# Patient Record
Sex: Female | Born: 1960
Health system: Southern US, Community
[De-identification: ages and names within clinical notes are randomized; demographics above are authoritative.]

## PROBLEM LIST (undated history)

## (undated) ENCOUNTER — Ambulatory Visit: Admission: EM

## (undated) DIAGNOSIS — M1990 Unspecified osteoarthritis, unspecified site: Secondary | ICD-10-CM

## (undated) DIAGNOSIS — E039 Hypothyroidism, unspecified: Secondary | ICD-10-CM

## (undated) DIAGNOSIS — K6289 Other specified diseases of anus and rectum: Secondary | ICD-10-CM

## (undated) DIAGNOSIS — Z9889 Other specified postprocedural states: Secondary | ICD-10-CM

## (undated) DIAGNOSIS — Z803 Family history of malignant neoplasm of breast: Secondary | ICD-10-CM

## (undated) DIAGNOSIS — R7301 Impaired fasting glucose: Secondary | ICD-10-CM

## (undated) DIAGNOSIS — R7303 Prediabetes: Secondary | ICD-10-CM

## (undated) DIAGNOSIS — R112 Nausea with vomiting, unspecified: Secondary | ICD-10-CM

## (undated) DIAGNOSIS — I10 Essential (primary) hypertension: Secondary | ICD-10-CM

## (undated) DIAGNOSIS — E079 Disorder of thyroid, unspecified: Secondary | ICD-10-CM

## (undated) DIAGNOSIS — E785 Hyperlipidemia, unspecified: Secondary | ICD-10-CM

## (undated) DIAGNOSIS — Z8041 Family history of malignant neoplasm of ovary: Secondary | ICD-10-CM

## (undated) DIAGNOSIS — D649 Anemia, unspecified: Secondary | ICD-10-CM

## (undated) DIAGNOSIS — E8881 Metabolic syndrome: Secondary | ICD-10-CM

## (undated) DIAGNOSIS — Z1371 Encounter for nonprocreative screening for genetic disease carrier status: Secondary | ICD-10-CM

## (undated) DIAGNOSIS — R002 Palpitations: Secondary | ICD-10-CM

## (undated) DIAGNOSIS — F419 Anxiety disorder, unspecified: Secondary | ICD-10-CM

## (undated) DIAGNOSIS — K219 Gastro-esophageal reflux disease without esophagitis: Secondary | ICD-10-CM

## (undated) DIAGNOSIS — T8859XA Other complications of anesthesia, initial encounter: Secondary | ICD-10-CM

## (undated) DIAGNOSIS — T4145XA Adverse effect of unspecified anesthetic, initial encounter: Secondary | ICD-10-CM

## (undated) HISTORY — PX: DILATION AND CURETTAGE OF UTERUS: SHX78

## (undated) HISTORY — DX: Hyperlipidemia, unspecified: E78.5

## (undated) HISTORY — DX: Family history of malignant neoplasm of breast: Z80.3

## (undated) HISTORY — DX: Disorder of thyroid, unspecified: E07.9

## (undated) HISTORY — DX: Other specified diseases of anus and rectum: K62.89

## (undated) HISTORY — DX: Impaired fasting glucose: R73.01

## (undated) HISTORY — DX: Family history of malignant neoplasm of ovary: Z80.41

## (undated) HISTORY — DX: Essential (primary) hypertension: I10

## (undated) HISTORY — DX: Palpitations: R00.2

## (undated) HISTORY — PX: OTHER SURGICAL HISTORY: SHX169

## (undated) HISTORY — DX: Metabolic syndrome: E88.81

---
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
## 2004-04-15 ENCOUNTER — Ambulatory Visit: Payer: Self-pay | Admitting: Podiatry

## 2004-12-22 ENCOUNTER — Ambulatory Visit: Payer: Self-pay | Admitting: Otolaryngology

## 2006-05-03 ENCOUNTER — Encounter: Payer: Self-pay | Admitting: Cardiovascular Disease

## 2006-05-03 LAB — CONVERTED CEMR LAB
Lymphocytes, automated: 32 %
Neutrophils Relative %: 57.9 %
RBC: 4.52 M/uL
RDW: 14.9 %
TSH: 0.293 microintl units/mL
WBC: 4.9 10*3/uL

## 2007-01-31 ENCOUNTER — Ambulatory Visit: Payer: Self-pay | Admitting: Internal Medicine

## 2008-01-29 ENCOUNTER — Ambulatory Visit: Payer: Self-pay | Admitting: Family Medicine

## 2008-02-07 ENCOUNTER — Ambulatory Visit: Payer: Self-pay | Admitting: Internal Medicine

## 2008-09-10 ENCOUNTER — Ambulatory Visit: Payer: Self-pay | Admitting: Endocrinology

## 2008-09-13 ENCOUNTER — Ambulatory Visit: Payer: Self-pay | Admitting: Endocrinology

## 2008-11-01 ENCOUNTER — Ambulatory Visit: Payer: Self-pay | Admitting: Endocrinology

## 2008-11-14 ENCOUNTER — Ambulatory Visit: Payer: Self-pay | Admitting: Endocrinology

## 2009-03-01 ENCOUNTER — Emergency Department: Payer: Self-pay | Admitting: Emergency Medicine

## 2009-05-10 ENCOUNTER — Ambulatory Visit: Payer: Self-pay | Admitting: Cardiovascular Disease

## 2009-05-10 DIAGNOSIS — E039 Hypothyroidism, unspecified: Secondary | ICD-10-CM | POA: Insufficient documentation

## 2009-05-10 DIAGNOSIS — E785 Hyperlipidemia, unspecified: Secondary | ICD-10-CM | POA: Insufficient documentation

## 2009-05-10 DIAGNOSIS — I10 Essential (primary) hypertension: Secondary | ICD-10-CM | POA: Insufficient documentation

## 2009-05-29 ENCOUNTER — Encounter: Payer: Self-pay | Admitting: Cardiovascular Disease

## 2009-05-29 HISTORY — PX: ENDOMETRIAL ABLATION W/ NOVASURE: SUR434

## 2009-09-26 ENCOUNTER — Ambulatory Visit: Payer: Self-pay | Admitting: Cardiovascular Disease

## 2009-11-21 ENCOUNTER — Ambulatory Visit: Payer: Self-pay | Admitting: Family Medicine

## 2009-12-18 ENCOUNTER — Ambulatory Visit: Payer: Self-pay | Admitting: Obstetrics & Gynecology

## 2010-02-03 ENCOUNTER — Ambulatory Visit: Payer: Self-pay | Admitting: Internal Medicine

## 2010-03-16 DIAGNOSIS — R002 Palpitations: Secondary | ICD-10-CM

## 2010-03-16 HISTORY — DX: Palpitations: R00.2

## 2010-04-15 NOTE — Progress Notes (Signed)
Summary: PHI  PHI   Imported By: Harlon Flor 05/13/2009 08:24:10  _____________________________________________________________________  External Attachment:    Type:   Image     Comment:   External Document

## 2010-04-15 NOTE — Progress Notes (Signed)
Summary: PHI  PHI   Imported By: Harlon Flor 05/13/2009 10:18:09  _____________________________________________________________________  External Attachment:    Type:   Image     Comment:   External Document

## 2010-04-15 NOTE — Letter (Signed)
Summary: Medical Record Release  Medical Record Release   Imported By: Harlon Flor 05/13/2009 08:23:51  _____________________________________________________________________  External Attachment:    Type:   Image     Comment:   External Document

## 2010-04-15 NOTE — Assessment & Plan Note (Signed)
Summary: NP6   Visit Type:  new patient Referring Provider:  Letta Kocher Primary Provider:  Christell Constant  CC:  HTN.  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  She states that she was initially started on HCTZ for hypertension. She initially started on a half dose and then has since titrated up to 25 mg daily. Her pressures have improved. She is concerned that she might need additional medications in the future. She states that she is trying to work out more and watch her diet. Her weight has been difficult to lose and she is very busy during the daytime and has kids. She has hypothyroidism as well.  She is concerned about her high cholesterol which was measured several months ago though she does note that the numbers. She states any family history of stroke. Otherwise no significant shortness of breath or chest pain she is relatively active though does not participate in an exercise program.  Preventive Screening-Counseling & Management  Alcohol-Tobacco     Smoking Status: never  Caffeine-Diet-Exercise     Does Patient Exercise: no      Drug Use:  no.    Current Medications (verified): 1)  Hydrochlorothiazide 25 Mg Tabs (Hydrochlorothiazide) .... Take One Tablet By Mouth Daily. 2)  Synthroid 137 Mcg Tabs (Levothyroxine Sodium) .... 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  Alcohol  Use - no Regular Exercise - no Drug Use - no Smoking Status:  never Does Patient Exercise:  no Drug Use:  no  Review of Systems  The patient denies anorexia, fever, weight loss, weight gain, vision loss, decreased hearing, hoarseness, chest pain, syncope, dyspnea on exertion, peripheral edema, prolonged cough, headaches, hemoptysis, abdominal pain, melena, hematochezia, severe indigestion/heartburn, hematuria, incontinence, genital sores, muscle weakness, suspicious skin lesions, transient blindness, difficulty walking, depression, unusual weight change, abnormal bleeding, enlarged lymph nodes, and breast masses.    Vital Signs:  Patient profile:   50 year old female Height:      67 inches Weight:      212 pounds BMI:     33.32 Pulse rate:   84 / minute BP sitting:   136 / 74  (right arm) Cuff size:   large  Vitals Entered By: Hardin Negus, RMA (May 10, 2009 11:17 AM) CC: HTN Comments had a depo shot in Jan   Physical Exam  General:  Middle aged woman in no apparent distress, alert and oriented x3, HEENT exam is benign, neck is supple with no JVP or carotid bruits, heart sounds are regular with normal S1 and S2 and no murmurs appreciated, lungs are clear, abdominal exam notable for mild obesity though otherwise benign, no significant lower extremity edema, neurologic exam is nonfocal,  skin is warm and dry.   Problems:  Medical Problems Added: 1)  Dx of Unspecified Hypothyroidism  (ICD-244.9) 2)  Dx of Hyperlipidemia-mixed  (ICD-272.4) 3)  Dx of Hypertension, Unspecified  (ICD-401.9)  EKG  Procedure date:  05/10/2009  Findings:      normal sinus rhythm with rate of 84 beats per minute, no significant ST or T wave changes.  Impression & Recommendations:  Problem # 1:  HYPERTENSION, UNSPECIFIED (ICD-401.9) blood pressure is well controlled on today's visit we have suggested that she continue on her current medication dose. We have encouraged increased exercise,  weight loss and watching her diet. Her updated medication list for this problem includes:    Hydrochlorothiazide 25 Mg Tabs (Hydrochlorothiazide) .Marland Kitchen... Take one tablet by mouth daily.  Problem # 2:  HYPERLIPIDEMIA-MIXED (ICD-272.4) per the patient, she has a history of hyperlipidemia. We have suggested that she worked hard for the next 6 months on her diet, weight loss and exercise and recheck it in 6 months time. she does have a family history of peripheral vascular disease in her mother and father. I have asked her to try to obtain her most recent lipid panel for our review as well.  Problem # 3:  UNSPECIFIED HYPOTHYROIDISM (ICD-244.9) history of hypothyroidism for many years. This has been managed by her primary care physician. Her updated medication list for this problem includes:    Synthroid 137 Mcg Tabs (Levothyroxine sodium) ..... Once daily

## 2010-04-15 NOTE — Assessment & Plan Note (Signed)
Summary: ROV   Visit Type:  rov Referring Provider:  Letta Kocher Primary Provider:  Christell Constant  CC:  palpitations...sob...pt thinks she may be having some anxiety attacks....edema/ankles....denies any cp.  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  she reports that she has not been taking her HCTZ. She has been having periods of anxiety, several anxiety attacks. She reports several episodes when she felt claustrophobic and felt very anxious and had to go to the emergency room. One was when she was climbing a Environmental manager. She also reports another episode when she was in a crowd at a graduation. Her dates have been stressful, busy at work. She reports a significant amount of palpitations recently and has been relatively symptomatic. These do not come and long stretches though intermittently.  normal sinus rhythm with rate of 92 beats per minute, no significant ST or T wave changes.   Current Medications (verified): 1)  Hydrochlorothiazide 25 Mg Tabs (Hydrochlorothiazide) .Marland Kitchen.. 1 Tab Twice Weekly 2)  Synthroid 137 Mcg Tabs (Levothyroxine Sodium) .... Once Daily  Allergies (verified): No Known Drug Allergies  Past History:  Past Surgical History: Last updated: 05/10/2009 D&C  C-section  Review of Systems  The patient denies fever, weight loss, weight gain, vision loss, decreased hearing, hoarseness, chest pain, syncope, dyspnea on exertion, peripheral edema, prolonged cough, abdominal pain, incontinence, muscle weakness, depression, and enlarged lymph nodes.         palpitations  Vital Signs:  Patient profile:   49 year old female Height:      67 inches Weight:      214 pounds BMI:     33.64 Pulse rate:   92 / minute Pulse rhythm:   irregular BP sitting:   139 / 91  (left arm) Cuff size:   large  Vitals Entered By: Danielle Rankin, CMA  (September 26, 2009 3:28 PM)  Physical Exam  General:  Well developed, well nourished, in no acute distress. Head:  normocephalic and atraumatic Neck:  Neck supple, no JVD. No masses, thyromegaly or abnormal cervical nodes. Lungs:  Clear bilaterally to auscultation and percussion. Heart:  Non-displaced PMI, chest non-tender; regular rate and rhythm, S1, S2 without murmurs, rubs or gallops. Carotid upstroke normal, no bruit.  Pedals normal pulses. No edema, no varicosities. Abdomen:  Bowel sounds positive; abdomen soft and non-tender without masses Msk:  Back normal, normal gait. Muscle strength and tone normal. Pulses:  pulses normal in all 4 extremities Extremities:  No clubbing or cyanosis. Neurologic:  Alert and oriented x 3. Skin:  Intact without lesions or rashes. Psych:  Slightly anxious   Impression & Recommendations:  Problem # 1:  HYPERTENSION, UNSPECIFIED (ICD-401.9) her blood pressure is mildly elevated today. She has not been taking her HCTZ for uncertain reasons Her heart rate is mildly elevated today and she reports numerous episodes of significant anxiety. She has had recent episodes of palpitations. Her heart rate is mildly elevated in the office today and she appears slightly anxious.  We have suggested that she try a low-dose beta blocker, bystolic 5 mg daily and we have given her some samples. If this works, we could change it to a generic equivalent. This would help with palpitations that she is having, elevated heart rates and with her blood pressure.  If she continues to have anxiety, I've asked her to talk with her primary care physician  to see if she may benefit from p.r.n. benzodiazepines or other medications.  The following medications were removed from the medication list:    Hydrochlorothiazide 25 Mg Tabs (Hydrochlorothiazide) .Marland Kitchen... 1 tab twice weekly Her updated medication list for this problem includes:    Bystolic 5 Mg Tabs (Nebivolol hcl) ..... Once  daily  Problem # 2:  HYPERLIPIDEMIA-MIXED (ICD-272.4) She reports having an elevated cholesterol when it was checked in the hospital.  We have asked her to have this checked either through work we can do it in  our office.  Patient Instructions: 1)  Your physician recommends that you schedule a follow-up appointment in:  6 mths 2)  Your physician has recommended you make the following change in your medication: start Bystolic 5 mg once daily

## 2010-08-26 ENCOUNTER — Telehealth: Payer: Self-pay | Admitting: Cardiovascular Disease

## 2010-08-26 NOTE — Telephone Encounter (Signed)
Pt called in stating that she had discussed Bystolic with you at her last appt which was 09/2009. She was given samples at that time but did not take them. She would like to try medication because her BP has been increasing. She states that she has lost some weight but her BP has been increasing. She is having oral surgery on the 21st and states she needs something to bring down BP. She didn't have any BP readings but she is due for a f/u you. Notified patient I didn't feel comfortable calling in a medication that she hasn't tried and I tried bring her in on June 14th. But pt has to work, she can not make it in at 1:45. (which was our only opening) Please advise. Thanks, Huntley Dec

## 2010-08-26 NOTE — Telephone Encounter (Signed)
Pt discussed Bystollic with Gollan.  Would like to try.  Please call the pt to advise.  Pt states that she has not been able to control BP with diet.

## 2010-08-27 NOTE — Telephone Encounter (Signed)
She could try bystolic (we have samples of 5 mg daily that she could pick up She could also try lisinopril 10 mg daily. We could call the lisinopril to [facility] pharmacy

## 2010-08-28 NOTE — Telephone Encounter (Signed)
Notified patient Dr. Mariah Milling suggested Bystolic 5 mg or lisinopril 10 mg daily.  She would like to try the samples of Bystolic 5 mg first. Gave samples for two week supply.

## 2010-09-11 ENCOUNTER — Encounter: Payer: Self-pay | Admitting: Cardiovascular Disease

## 2010-09-11 ENCOUNTER — Ambulatory Visit (INDEPENDENT_AMBULATORY_CARE_PROVIDER_SITE_OTHER): Payer: Private Health Insurance - Indemnity | Admitting: Cardiovascular Disease

## 2010-09-11 DIAGNOSIS — E785 Hyperlipidemia, unspecified: Secondary | ICD-10-CM

## 2010-09-11 DIAGNOSIS — I10 Essential (primary) hypertension: Secondary | ICD-10-CM

## 2010-09-11 DIAGNOSIS — R002 Palpitations: Secondary | ICD-10-CM | POA: Insufficient documentation

## 2010-09-11 NOTE — Assessment & Plan Note (Signed)
Blood pressure is well controlled on today's visit. She is not taking bystolic on a regular basis, only for palpitations.

## 2010-09-11 NOTE — Patient Instructions (Signed)
You are doing well. No medication changes were made. Please call us if you have new issues that need to be addressed before your next appt.  We will call you for a follow up Appt. In 12 months  

## 2010-09-11 NOTE — Assessment & Plan Note (Signed)
Palpitations have been well controlled on no significant beta blocker. I suspect are secondary to stress. She can take low dose beta blocker as needed

## 2010-09-11 NOTE — Assessment & Plan Note (Signed)
We talked at length with her about her lipids. We have recommended weight loss, she does not want a prescription medicine. She reports that her cholesterol has been elevated in the past. We will check it when she has her numbers done through a [facility] in October.

## 2010-09-11 NOTE — Progress Notes (Signed)
   Patient ID: Desiree Walker, female    DOB: 09/12/1960, 50 y.o.   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 She has a history of anxiety, several anxiety attacks. H/o palpitations requiring low dose b-blockers.   She reports that she has been gaining weight. Her weight was 190 on weight watchers, now 202. She is very stressed, working 2 jobs and doing school at night time Monday through Thursday. She denies significant palpitations and has not been taking bystolic.    normal sinus rhythm with rate of 81 beats per minute, nonspecific ST changes in the anterolateral leads and inferior leads,  Likely early repol. Abn.        Review of Systems  Constitutional: Positive for unexpected weight change.  HENT: Negative.   Eyes: Negative.   Respiratory: Negative.   Cardiovascular: Negative.   Gastrointestinal: Negative.   Musculoskeletal: Negative.   Skin: Negative.   Neurological: Negative.   Hematological: Negative.   Psychiatric/Behavioral: The patient is nervous/anxious.   All other systems reviewed and are negative.    BP 121/79  Pulse 81  Ht 5\' 7"  (1.702 m)  Wt 202 lb (91.627 kg)  BMI 31.64 kg/m2  Physical Exam  Nursing note and vitals reviewed. Constitutional: She is oriented to person, place, and time. She appears well-developed and well-nourished.  HENT:  Head: Normocephalic.  Nose: Nose normal.  Mouth/Throat: Oropharynx is clear and moist.  Eyes: Conjunctivae are normal. Pupils are equal, round, and reactive to light.  Neck: Normal range of motion. Neck supple. No JVD present.  Cardiovascular: Normal rate, regular rhythm, S1 normal, S2 normal, normal heart sounds and intact distal pulses.  Exam reveals no gallop and no friction rub.   No murmur heard. Pulmonary/Chest: Effort normal and breath sounds normal. No respiratory distress. She has no  wheezes. She has no rales. She exhibits no tenderness.  Abdominal: Soft. Bowel sounds are normal. She exhibits no distension. There is no tenderness.  Musculoskeletal: Normal range of motion. She exhibits no edema and no tenderness.  Lymphadenopathy:    She has no cervical adenopathy.  Neurological: She is alert and oriented to person, place, and time. Coordination normal.  Skin: Skin is warm and dry. No rash noted. No erythema.  Psychiatric: She has a normal mood and affect. Her behavior is normal. Judgment and thought content normal.         Assessment and Plan

## 2010-11-07 ENCOUNTER — Ambulatory Visit: Payer: Self-pay | Admitting: Podiatry

## 2010-11-07 ENCOUNTER — Other Ambulatory Visit: Payer: Self-pay | Admitting: Physician Assistant

## 2011-01-09 ENCOUNTER — Ambulatory Visit: Payer: Self-pay | Admitting: General Practice

## 2011-01-20 ENCOUNTER — Emergency Department: Payer: Self-pay | Admitting: Unknown Physician Specialty

## 2011-04-20 ENCOUNTER — Ambulatory Visit: Payer: Self-pay | Admitting: Gastroenterology

## 2011-08-17 ENCOUNTER — Ambulatory Visit: Payer: Self-pay

## 2011-08-17 ENCOUNTER — Ambulatory Visit: Payer: Self-pay | Admitting: Emergency Medicine

## 2011-08-31 ENCOUNTER — Ambulatory Visit: Payer: Self-pay | Admitting: Obstetrics & Gynecology

## 2011-11-03 ENCOUNTER — Other Ambulatory Visit: Payer: Self-pay

## 2011-11-03 LAB — TSH: Thyroid Stimulating Horm: 0.385 u[IU]/mL — ABNORMAL LOW

## 2011-11-28 ENCOUNTER — Ambulatory Visit: Payer: Self-pay | Admitting: Medical

## 2011-12-02 ENCOUNTER — Ambulatory Visit: Payer: Self-pay | Admitting: Family Medicine

## 2011-12-02 LAB — CBC WITH DIFFERENTIAL/PLATELET
Basophil #: 0.1 10*3/uL (ref 0.0–0.1)
Eosinophil %: 2.8 %
HCT: 42 % (ref 35.0–47.0)
Lymphocyte #: 1.5 10*3/uL (ref 1.0–3.6)
Lymphocyte %: 32.1 %
Monocyte %: 9.6 %
Platelet: 210 10*3/uL (ref 150–440)
RDW: 13.4 % (ref 11.5–14.5)
WBC: 4.7 10*3/uL (ref 3.6–11.0)

## 2012-01-01 ENCOUNTER — Other Ambulatory Visit: Payer: Self-pay | Admitting: General Practice

## 2012-01-01 LAB — LIPID PANEL
Cholesterol: 220 mg/dL — ABNORMAL HIGH (ref 0–200)
Ldl Cholesterol, Calc: 121 mg/dL — ABNORMAL HIGH (ref 0–100)
VLDL Cholesterol, Calc: 46 mg/dL — ABNORMAL HIGH (ref 5–40)

## 2012-01-01 LAB — COMPREHENSIVE METABOLIC PANEL
Albumin: 3.8 g/dL (ref 3.4–5.0)
Anion Gap: 10 (ref 7–16)
Bilirubin,Total: 0.3 mg/dL (ref 0.2–1.0)
Calcium, Total: 8.7 mg/dL (ref 8.5–10.1)
Co2: 26 mmol/L (ref 21–32)
Osmolality: 282 (ref 275–301)
Potassium: 3.7 mmol/L (ref 3.5–5.1)
Sodium: 141 mmol/L (ref 136–145)
Total Protein: 7.3 g/dL (ref 6.4–8.2)

## 2012-01-01 LAB — CBC WITH DIFFERENTIAL/PLATELET
Basophil %: 0.8 %
Eosinophil %: 2.2 %
HGB: 12.7 g/dL (ref 12.0–16.0)
MCH: 26.6 pg (ref 26.0–34.0)
Monocyte #: 0.4 x10 3/mm (ref 0.2–0.9)
Monocyte %: 6.3 %
Neutrophil %: 57.1 %
Platelet: 192 10*3/uL (ref 150–440)

## 2012-01-01 LAB — TSH: Thyroid Stimulating Horm: 1.78 u[IU]/mL

## 2012-01-25 ENCOUNTER — Ambulatory Visit: Payer: Self-pay

## 2012-01-26 ENCOUNTER — Other Ambulatory Visit: Payer: Self-pay

## 2012-01-26 LAB — TSH: Thyroid Stimulating Horm: 1.39 u[IU]/mL

## 2012-02-14 ENCOUNTER — Ambulatory Visit: Payer: Self-pay

## 2012-03-16 ENCOUNTER — Ambulatory Visit: Payer: Self-pay

## 2012-03-16 HISTORY — PX: COLONOSCOPY: SHX174

## 2012-04-08 ENCOUNTER — Ambulatory Visit: Payer: Self-pay

## 2012-05-31 ENCOUNTER — Other Ambulatory Visit: Payer: Self-pay

## 2012-05-31 LAB — TSH: Thyroid Stimulating Horm: 2.46 u[IU]/mL

## 2012-06-01 LAB — CBC WITH DIFFERENTIAL/PLATELET
Basophil #: 0 10*3/uL (ref 0.0–0.1)
Eosinophil %: 1.6 %
HGB: 12.6 g/dL (ref 12.0–16.0)
Lymphocyte #: 2 10*3/uL (ref 1.0–3.6)
MCHC: 32.8 g/dL (ref 32.0–36.0)
MCV: 81 fL (ref 80–100)
Monocyte #: 0.3 x10 3/mm (ref 0.2–0.9)
Monocyte %: 6.1 %
Neutrophil %: 53.7 %
Platelet: 212 10*3/uL (ref 150–440)
RDW: 13.9 % (ref 11.5–14.5)

## 2012-06-01 LAB — COMPREHENSIVE METABOLIC PANEL
Albumin: 3.8 g/dL (ref 3.4–5.0)
BUN: 18 mg/dL (ref 7–18)
Co2: 26 mmol/L (ref 21–32)
EGFR (African American): >60
EGFR (Non-African Amer.): >60
Glucose: 78 mg/dL (ref 65–99)
Osmolality: 278 (ref 275–301)
Potassium: 4.2 mmol/L (ref 3.5–5.1)
SGOT(AST): 23 U/L (ref 15–37)

## 2012-07-27 DIAGNOSIS — G8929 Other chronic pain: Secondary | ICD-10-CM | POA: Insufficient documentation

## 2012-07-27 DIAGNOSIS — R1031 Right lower quadrant pain: Secondary | ICD-10-CM | POA: Insufficient documentation

## 2012-08-01 ENCOUNTER — Other Ambulatory Visit: Payer: Self-pay

## 2012-08-01 LAB — BASIC METABOLIC PANEL
Calcium, Total: 9.3 mg/dL (ref 8.5–10.1)
Chloride: 104 mmol/L (ref 98–107)
Co2: 29 mmol/L (ref 21–32)
EGFR (African American): >60
EGFR (Non-African Amer.): >60
Glucose: 94 mg/dL (ref 65–99)
Osmolality: 276 (ref 275–301)

## 2012-08-01 LAB — HEMOGLOBIN A1C: Hemoglobin A1C: 6 % (ref 4.2–6.3)

## 2012-08-23 ENCOUNTER — Ambulatory Visit: Payer: Self-pay | Admitting: General Surgery

## 2012-09-19 ENCOUNTER — Encounter: Payer: Self-pay | Admitting: *Deleted

## 2012-09-26 ENCOUNTER — Ambulatory Visit (INDEPENDENT_AMBULATORY_CARE_PROVIDER_SITE_OTHER): Payer: Managed Care, Other (non HMO) | Admitting: General Surgery

## 2012-09-26 ENCOUNTER — Encounter: Payer: Self-pay | Admitting: General Surgery

## 2012-09-26 VITALS — BP 150/88 | HR 68 | Resp 14 | Ht 67.0 in | Wt 221.0 lb

## 2012-09-26 DIAGNOSIS — G8929 Other chronic pain: Secondary | ICD-10-CM

## 2012-09-26 DIAGNOSIS — R1031 Right lower quadrant pain: Secondary | ICD-10-CM

## 2012-09-26 NOTE — Progress Notes (Signed)
Patient ID: Desiree Walker, female   DOB: 07/20/1960, 52 y.o.   MRN: [id number]  Chief Complaint  Patient presents with  . Abdominal Pain    HPI Desiree Walker is a 52 y.o. female who presents for chronic pelvic and right lower quadrant discomfort. She states the discomfort started approximately 2 months ago. She states she has had some weight gain in this time frame. She has noticed a difference in appetite. Her appetite has decreased and she has had some nausea associated with eating. Patient reports last menstrual period was two years ago.  The patient reports early satiety, in spite of this reports significant weight gain (10-15 pounds) the last 6 months.  She has not appreciated any diarrhea or change in bowel habits.No vomiting.  Abdominal Pain Associated symptoms include nausea.    Past Medical History  Diagnosis Date  . Hyperlipidemia   . Hypertension   . Palpitations   . Thyroid disease     unspecified hypothyroidism    Past Surgical History  Procedure Laterality Date  . Dilation and curettage of uterus    . Cesarean section      Family History  Problem Relation Age of Onset  . Hypertension Mother   . Heart disease Mother   . Cancer Sister 70    uterine  . Cancer Maternal Aunt 45    breast    Social History History  Substance Use Topics  . Smoking status: Never Smoker   . Smokeless tobacco: Not on file  . Alcohol Use: No    No Known Allergies  Current Outpatient Prescriptions  Medication Sig Dispense Refill  . levothyroxine (SYNTHROID, LEVOTHROID) 112 MCG tablet Take 112 mcg by mouth daily before breakfast.       No current facility-administered medications for this visit.    Review of Systems Review of Systems  Constitutional: Positive for appetite change.  Respiratory: Negative.   Cardiovascular: Negative.   Gastrointestinal: Positive for nausea and abdominal pain.    Blood pressure 150/88, pulse 68, resp. rate 14, height 5\' 7"   (1.702 m), weight 221 lb (100.245 kg).  Physical Exam Physical Exam  Constitutional: She is oriented to person, place, and time. She appears well-developed and well-nourished.  Neck: No thyromegaly present.  Cardiovascular: Normal rate, regular rhythm and normal heart sounds.   No murmur heard. Pulmonary/Chest: Effort normal and breath sounds normal.  Abdominal: Soft. Normal appearance and bowel sounds are normal.  Lymphadenopathy:    She has no cervical adenopathy.  Neurological: She is alert and oriented to person, place, and time.  Skin: Skin is warm and dry.    Data Reviewed Radiologic data base was reviewed for the last 2 years. CT of the neck in January 2014 and chest September 2013 reviewed.  Assessment    Chronic lower abdominal pain, right lower quadrant predominant.     Plan    The patient has no significant GI symptoms to suggest a colonic source, and she has had a colonoscopy with the last few years. She does show some mild tenderness with palpation right lower quadrant. Her GYN exam and transvaginal ultrasound were unremarkable. I think a CT scan of the abdomen and pelvis and pleura 5 any occult pathology was present.     Patient has been scheduled for a CT abdomen/pelvis with contrast at [facility] for 09-30-12 at 9 am (arrive 8:45 am). Prep: no solids 4 hours prior but patient may have clear liquids up until exam time, pick up prep kit,  and take medication list. Patient verbalizes understanding.    Earline Mayotte 09/26/2012, 9:09 PM

## 2012-09-26 NOTE — Patient Instructions (Signed)
Patient has been scheduled for a CT abdomen/pelvis with contrast at [facility] for 09-30-12 at 9 am (arrive 8:45 am). Prep: no solids 4 hours prior but patient may have clear liquids up until exam time, pick up prep kit, and take medication list. Patient verbalizes understanding.

## 2012-10-03 ENCOUNTER — Ambulatory Visit: Payer: Self-pay | Admitting: General Surgery

## 2012-10-06 ENCOUNTER — Telehealth: Payer: Self-pay

## 2012-10-06 NOTE — Telephone Encounter (Signed)
Patient called and would like the results of her CT scan. Patient will be at this number today and tomorrow between 7:30 am and 4:30 pm. [phone].

## 2012-10-07 ENCOUNTER — Encounter: Payer: Self-pay | Admitting: *Deleted

## 2013-02-02 ENCOUNTER — Other Ambulatory Visit: Payer: Self-pay

## 2013-02-11 ENCOUNTER — Ambulatory Visit: Payer: Self-pay | Admitting: Orthopedic Surgery

## 2013-03-01 ENCOUNTER — Encounter: Payer: Self-pay | Admitting: Orthopedic Surgery

## 2013-03-16 ENCOUNTER — Encounter: Payer: Self-pay | Admitting: Orthopedic Surgery

## 2013-03-24 ENCOUNTER — Other Ambulatory Visit: Payer: Self-pay

## 2013-03-24 LAB — TSH: Thyroid Stimulating Horm: 2.71 u[IU]/mL

## 2013-04-10 ENCOUNTER — Emergency Department: Payer: Self-pay | Admitting: Emergency Medicine

## 2013-04-10 LAB — BASIC METABOLIC PANEL
Anion Gap: 4 — ABNORMAL LOW (ref 7–16)
BUN: 14 mg/dL (ref 7–18)
CHLORIDE: 107 mmol/L (ref 98–107)
CREATININE: 0.86 mg/dL (ref 0.60–1.30)
Calcium, Total: 9.4 mg/dL (ref 8.5–10.1)
Co2: 26 mmol/L (ref 21–32)
EGFR (African American): >60
EGFR (Non-African Amer.): >60
Glucose: 110 mg/dL — ABNORMAL HIGH (ref 65–99)
Osmolality: 275 (ref 275–301)
Potassium: 3.9 mmol/L (ref 3.5–5.1)
Sodium: 137 mmol/L (ref 136–145)

## 2013-04-10 LAB — CBC WITH DIFFERENTIAL/PLATELET
BASOS ABS: 0.1 10*3/uL (ref 0.0–0.1)
BASOS PCT: 0.8 %
EOS PCT: 1.1 %
Eosinophil #: 0.1 10*3/uL (ref 0.0–0.7)
HCT: 41.8 % (ref 35.0–47.0)
HGB: 13.6 g/dL (ref 12.0–16.0)
Lymphocyte #: 1.3 10*3/uL (ref 1.0–3.6)
Lymphocyte %: 18.4 %
MCH: 26.5 pg (ref 26.0–34.0)
MCHC: 32.5 g/dL (ref 32.0–36.0)
MCV: 82 fL (ref 80–100)
MONOS PCT: 5.1 %
Monocyte #: 0.4 x10 3/mm (ref 0.2–0.9)
NEUTROS ABS: 5.3 10*3/uL (ref 1.4–6.5)
Neutrophil %: 74.6 %
Platelet: 201 10*3/uL (ref 150–440)
RBC: 5.13 10*6/uL (ref 3.80–5.20)
RDW: 13.5 % (ref 11.5–14.5)
WBC: 7 10*3/uL (ref 3.6–11.0)

## 2013-04-10 LAB — TROPONIN I: Troponin-I: <0.02 ng/mL

## 2013-04-10 LAB — RAPID INFLUENZA A&B ANTIGENS

## 2013-04-11 LAB — URINALYSIS, COMPLETE
Bilirubin,UR: NEGATIVE
Blood: NEGATIVE
Glucose,UR: NEGATIVE mg/dL (ref 0–75)
KETONE: NEGATIVE
Nitrite: NEGATIVE
Ph: 7 (ref 4.5–8.0)
Protein: NEGATIVE
Specific Gravity: 1.01 (ref 1.003–1.030)
Squamous Epithelial: 5

## 2013-04-12 LAB — URINE CULTURE

## 2013-05-03 ENCOUNTER — Other Ambulatory Visit: Payer: Self-pay | Admitting: Physician Assistant

## 2013-05-03 LAB — URINALYSIS, COMPLETE
BLOOD: NEGATIVE
Bilirubin,UR: NEGATIVE
GLUCOSE, UR: NEGATIVE mg/dL (ref 0–75)
Ketone: NEGATIVE
Nitrite: NEGATIVE
Ph: 6 (ref 4.5–8.0)
Protein: NEGATIVE
RBC,UR: 1 /HPF (ref 0–5)
Specific Gravity: 1.013 (ref 1.003–1.030)
Squamous Epithelial: 5
WBC UR: 11 /HPF (ref 0–5)

## 2013-05-05 LAB — URINE CULTURE

## 2013-06-05 DIAGNOSIS — N393 Stress incontinence (female) (male): Secondary | ICD-10-CM | POA: Insufficient documentation

## 2013-06-05 DIAGNOSIS — R339 Retention of urine, unspecified: Secondary | ICD-10-CM | POA: Insufficient documentation

## 2013-06-05 DIAGNOSIS — N3020 Other chronic cystitis without hematuria: Secondary | ICD-10-CM | POA: Insufficient documentation

## 2013-06-05 DIAGNOSIS — N3941 Urge incontinence: Secondary | ICD-10-CM | POA: Insufficient documentation

## 2013-06-05 DIAGNOSIS — R300 Dysuria: Secondary | ICD-10-CM | POA: Insufficient documentation

## 2013-06-09 DIAGNOSIS — D414 Neoplasm of uncertain behavior of bladder: Secondary | ICD-10-CM | POA: Insufficient documentation

## 2013-07-19 ENCOUNTER — Ambulatory Visit: Payer: Self-pay

## 2013-07-19 LAB — TSH: Thyroid Stimulating Horm: 3.17 u[IU]/mL

## 2013-07-20 DIAGNOSIS — E063 Autoimmune thyroiditis: Secondary | ICD-10-CM | POA: Insufficient documentation

## 2013-09-05 ENCOUNTER — Other Ambulatory Visit: Payer: Self-pay

## 2013-09-05 LAB — TSH: Thyroid Stimulating Horm: 1.47 u[IU]/mL

## 2013-09-14 ENCOUNTER — Other Ambulatory Visit: Payer: Self-pay | Admitting: Family Medicine

## 2013-09-14 LAB — COMPREHENSIVE METABOLIC PANEL
ALT: 20 U/L (ref 12–78)
Albumin: 3.6 g/dL (ref 3.4–5.0)
Alkaline Phosphatase: 90 U/L
Anion Gap: 5 — ABNORMAL LOW (ref 7–16)
BILIRUBIN TOTAL: 0.3 mg/dL (ref 0.2–1.0)
BUN: 19 mg/dL — ABNORMAL HIGH (ref 7–18)
CHLORIDE: 106 mmol/L (ref 98–107)
CO2: 28 mmol/L (ref 21–32)
Calcium, Total: 9.2 mg/dL (ref 8.5–10.1)
Creatinine: 0.89 mg/dL (ref 0.60–1.30)
EGFR (African American): >60
EGFR (Non-African Amer.): >60
Glucose: 111 mg/dL — ABNORMAL HIGH (ref 65–99)
OSMOLALITY: 280 (ref 275–301)
Potassium: 4 mmol/L (ref 3.5–5.1)
SGOT(AST): 15 U/L (ref 15–37)
Sodium: 139 mmol/L (ref 136–145)
TOTAL PROTEIN: 7.2 g/dL (ref 6.4–8.2)

## 2013-09-14 LAB — LIPID PANEL
Cholesterol: 234 mg/dL — ABNORMAL HIGH (ref 0–200)
HDL: 51 mg/dL (ref 40–60)
LDL CHOLESTEROL, CALC: 153 mg/dL — AB (ref 0–100)
TRIGLYCERIDES: 149 mg/dL (ref 0–200)
VLDL Cholesterol, Calc: 30 mg/dL (ref 5–40)

## 2013-09-14 LAB — CBC WITH DIFFERENTIAL/PLATELET
Basophil #: 0 10*3/uL (ref 0.0–0.1)
Basophil %: 1.2 %
Eosinophil #: 0.1 10*3/uL (ref 0.0–0.7)
Eosinophil %: 3 %
HCT: 39.7 % (ref 35.0–47.0)
HGB: 13.3 g/dL (ref 12.0–16.0)
LYMPHS ABS: 1.5 10*3/uL (ref 1.0–3.6)
Lymphocyte %: 38.4 %
MCH: 26.9 pg (ref 26.0–34.0)
MCHC: 33.4 g/dL (ref 32.0–36.0)
MCV: 81 fL (ref 80–100)
Monocyte #: 0.3 x10 3/mm (ref 0.2–0.9)
Monocyte %: 6.9 %
NEUTROS ABS: 2 10*3/uL (ref 1.4–6.5)
Neutrophil %: 50.5 %
Platelet: 207 10*3/uL (ref 150–440)
RBC: 4.93 10*6/uL (ref 3.80–5.20)
RDW: 13.8 % (ref 11.5–14.5)
WBC: 4 10*3/uL (ref 3.6–11.0)

## 2013-09-14 LAB — HEMOGLOBIN A1C: Hemoglobin A1C: 5.9 % (ref 4.2–6.3)

## 2013-09-25 ENCOUNTER — Ambulatory Visit: Payer: Private Health Insurance - Indemnity | Admitting: Cardiovascular Disease

## 2013-09-29 ENCOUNTER — Ambulatory Visit: Payer: Self-pay | Admitting: Family Medicine

## 2013-10-10 ENCOUNTER — Ambulatory Visit: Payer: Self-pay | Admitting: Podiatry

## 2013-10-10 LAB — BASIC METABOLIC PANEL
Anion Gap: 6 — ABNORMAL LOW (ref 7–16)
BUN: 12 mg/dL (ref 7–18)
CREATININE: 0.82 mg/dL (ref 0.60–1.30)
Calcium, Total: 9.2 mg/dL (ref 8.5–10.1)
Chloride: 104 mmol/L (ref 98–107)
Co2: 29 mmol/L (ref 21–32)
EGFR (African American): >60
EGFR (Non-African Amer.): >60
GLUCOSE: 81 mg/dL (ref 65–99)
Osmolality: 276 (ref 275–301)
Potassium: 4.1 mmol/L (ref 3.5–5.1)
Sodium: 139 mmol/L (ref 136–145)

## 2013-10-17 ENCOUNTER — Ambulatory Visit: Payer: Self-pay | Admitting: Family Medicine

## 2013-10-20 ENCOUNTER — Ambulatory Visit: Payer: Self-pay | Admitting: Podiatry

## 2013-11-06 ENCOUNTER — Ambulatory Visit: Payer: Self-pay | Admitting: Urgent Care

## 2013-12-25 ENCOUNTER — Ambulatory Visit: Payer: Self-pay | Admitting: Physician Assistant

## 2014-01-02 ENCOUNTER — Encounter: Payer: Self-pay | Admitting: Podiatry

## 2014-01-14 ENCOUNTER — Encounter: Payer: Self-pay | Admitting: Podiatry

## 2014-01-15 ENCOUNTER — Encounter: Payer: Self-pay | Admitting: General Surgery

## 2014-02-13 ENCOUNTER — Encounter: Payer: Self-pay | Admitting: Podiatry

## 2014-03-16 ENCOUNTER — Encounter: Payer: Self-pay | Admitting: Podiatry

## 2014-04-02 ENCOUNTER — Ambulatory Visit (INDEPENDENT_AMBULATORY_CARE_PROVIDER_SITE_OTHER): Payer: Managed Care, Other (non HMO)

## 2014-04-02 ENCOUNTER — Telehealth: Payer: Self-pay | Admitting: Podiatry

## 2014-04-02 ENCOUNTER — Ambulatory Visit (INDEPENDENT_AMBULATORY_CARE_PROVIDER_SITE_OTHER): Payer: Managed Care, Other (non HMO) | Admitting: Podiatry

## 2014-04-02 ENCOUNTER — Encounter: Payer: Self-pay | Admitting: Podiatry

## 2014-04-02 VITALS — BP 149/102 | HR 90 | Resp 16 | Ht 67.0 in | Wt 224.0 lb

## 2014-04-02 DIAGNOSIS — Q6652 Congenital pes planus, left foot: Secondary | ICD-10-CM

## 2014-04-02 NOTE — Telephone Encounter (Signed)
Patient called stating she spoke to you earlier in regards to getting medical records sent over for her 4pm appointment today. Please call her back on her work number.

## 2014-04-02 NOTE — Progress Notes (Signed)
She presents today after having not seen her for several years for a chief complaint of pain to her left foot. She saw another doctor and had reconstructive surgery to her left foot. She states that this was in August and she is currently still having pain on ambulation. She had a subtalar joint repair/fusion performed as well as a Lapidus procedure.  Objective: Vital signs are stable she is alert and oriented 3. Pulses are palpable left foot. All of her incisions along abdominal to heal uneventfully. She has mild tenderness on palpation of the lateral incision site good range of motion of the first metatarsophalangeal joint. Radiographic evaluation demonstrates subtalar joint fusion and a Lapidus procedure with good results.  Assessment: Residual surgical pain status post subtalar joint fusion and Lapidus procedure left.  Plan: I encouraged her to follow-up with her surgical Dr. for reevaluation.

## 2014-04-04 ENCOUNTER — Ambulatory Visit: Payer: Managed Care, Other (non HMO) | Admitting: Podiatry

## 2014-04-11 ENCOUNTER — Ambulatory Visit: Payer: Managed Care, Other (non HMO) | Admitting: Podiatry

## 2014-05-15 ENCOUNTER — Ambulatory Visit: Payer: Self-pay | Admitting: Podiatry

## 2014-07-07 NOTE — Op Note (Signed)
PATIENT NAME:  Desiree Walker, Desiree Walker MR#:  [id number] DATE OF BIRTH:  04-Sep-1960  DATE OF PROCEDURE:  10/20/2013  PREOPERATIVE DIAGNOSES: 1.  Left lower extremity equinus.  2.  Posterior tibial tendon dysfunction.  3.  Hallux valgus.   POSTOPERATIVE DIAGNOSES:  1.  Left lower extremity equinus.  2.  Posterior tibial tendon dysfunction.  3.  Hallux valgus.   PROCEDURES:   1.  Percutaneous tendo Achilles lengthening, left lower leg.  2.  Left subtalar joint arthrodesis.  3.  Lapidus left foot fusion.   ANESTHESIA: General with popliteal block.   HEMOSTASIS: Thigh tourniquet inflated to 325 mmHg for 120 minutes.   COMPLICATIONS: None.   SPECIMEN: None.   OPERATIVE INDICATIONS: A 54 year old female who has been seen in the outpatient clinic with a complaint of a painful left foot and ankle. She has undergone long-standing conservative treatment and presents today for surgery. All risks, benefits, alternatives, and complications associated with surgery were discussed with the patient in full and consent has been given.   OPERATIVE PROCEDURE: The patient was brought into the OR and placed on the operating table in the supine position. General intubation was administered after a popliteal block had been placed. The left lower extremity was then prepped and draped in the usual sterile fashion. Attention was directed to the posterior Achilles tendon where at 1, 3, and 5 cm proximal to its insertion, 3 hemisections were performed. Good lengthening of the tendon was noted. This was closed with a 3-0 nylon suture. Inflation of the tourniquet was then performed, and a lateral subtalar joint sinus tarsi incision was made. Sharp and blunt dissection was carried down to the subtalar joint. Next, all articular cartilage from the anterior, posterior, and middle facet was removed. This was burred with a power bur and drilled with a 1.9 mm drill bit. Then 1 mL DBX putty was then placed into the subtalar joint  sites. Next, two 7.0 mm OrthoHelix screws were placed from the posterior heel crossing the subtalar joint. Good alignment was noted in all planes and good compression of the subtalar joint was noted. The calcaneus was held in a neutral position with this. At this time, this was flushed with copious amounts of irrigation. Closure was performed with a 3-0 Vicryl for the deeper and subcutaneous tissues, and a 3-0 nylon for skin. Attention was then directed to the first metatarsal cuneiform joint where a dorsal incision was made. Sharp and blunt dissection was carried down to the sub periosteum and then subperiosteal dissection was undertaken. The articular cartilage was removed from the proximal distal aspect of the fusion site. This was further feathered with a power saw. The first ray was able to plantar flex. I was able to drill this with a 1.9 mm drill bit. Prior to final screw insertion, I packed the area with Surgiflo and dropped the tourniquet. After 10 minutes, this wound was then flushed and there were no overt bleeders in the region. At this time, 2 crossing 4.0 mm OrthoHelix cannulated screws were driven from distal to proximal and proximal to distal. Good compression was noted and excellent stability was noted with this. At this time, the wound was then closed with 3-0 Vicryl for the deeper and subcutaneous tissue and a 3-0 nylon for skin. All areas were then infiltrated with 0.25 Marcaine without epinephrine. The patient was placed in a well compressive sterile dressing and a posterior splint with the foot at 90 degrees in neutral. She was transported from the  OR to the PACU with all vital signs stable and vascular status intact. will have her seen in the outpatient clinic in 5-7 days. A prescription for Vicodin for pain has been given. I will see her outpatient as needed.    ____________________________ Pete Glatter Vickki Muff, DPM jaf:at D: 10/20/2013 11:35:25 ET T: 10/20/2013 12:37:33  ET JOB#: [id number]  cc: Larkin Ina A. Vickki Muff, DPM, <Dictator> Mayley Lish DPM ELECTRONICALLY SIGNED 11/21/2013 13:49

## 2014-08-06 ENCOUNTER — Ambulatory Visit: Payer: Managed Care, Other (non HMO) | Admitting: Podiatry

## 2014-08-15 ENCOUNTER — Ambulatory Visit: Payer: Self-pay | Admitting: Podiatry

## 2014-08-17 ENCOUNTER — Ambulatory Visit: Payer: Self-pay | Admitting: Podiatry

## 2014-08-20 ENCOUNTER — Ambulatory Visit: Payer: Managed Care, Other (non HMO) | Attending: Podiatry | Admitting: Physical Therapy

## 2014-08-20 ENCOUNTER — Encounter: Payer: Self-pay | Admitting: Physical Therapy

## 2014-08-20 DIAGNOSIS — M259 Joint disorder, unspecified: Secondary | ICD-10-CM | POA: Diagnosis present

## 2014-08-20 DIAGNOSIS — R29898 Other symptoms and signs involving the musculoskeletal system: Secondary | ICD-10-CM

## 2014-08-20 NOTE — Therapy (Signed)
[place] MAIN [facility] SERVICES [address] [place], [place], [address] Phone: [phone]   Fax:  [phone]  Physical Therapy Evaluation  Patient Details  Name: Desiree Walker MRN: [id number] Date of Birth: Jan 19, 1961 Referring Provider:  Samara Deist, DPM  Encounter Date: 08/20/2014      PT End of Session - 08/20/14 1554    Visit Number 1   Number of Visits 5   Date for PT Re-Evaluation 09/17/14   PT Start Time 0345      Past Medical History  Diagnosis Date  . Hyperlipidemia   . Hypertension   . Palpitations 2012    Evaluated by Ida Rogue, MD, Holter moniter  . Thyroid disease     unspecified hypothyroidism    Past Surgical History  Procedure Laterality Date  . Dilation and curettage of uterus    . Cesarean section      There were no vitals filed for this visit.  Visit Diagnosis:  Ankle weakness      Subjective Assessment - 08/20/14 1553    Subjective Patient is not happy with her walking and her left ankle is in 3/5 to 5/5 pain.             [facility] PT Assessment - 08/20/14 0001    Assessment   Medical Diagnosis subtaylor fusion   Onset Date/Surgical Date 10/20/13   Hand Dominance Right   Next MD Visit none planned   Prior Therapy none   Precautions   Precautions None   Restrictions   Weight Bearing Restrictions No   Balance Screen   Has the patient fallen in the past 6 months No   Is the patient reluctant to leave their home because of a fear of falling?  No   Sensation   Light Touch Appears Intact   ROM / Strength   AROM / PROM / Strength Strength   Strength   Overall Strength Deficits  left ankle PF unable to raise single heel w/ weight bearing   Overall Strength Comments --  left ankle DF 5/5, PF unable to raise up heel in WB position      LEFS 28/80                     PT Education - 08/20/14 1553    Education provided Yes   Person(s) Educated Patient   Methods  Explanation   Comprehension Verbalized understanding;Returned demonstration;Verbal cues required             PT Long Term Goals - 08/20/14 1705    PT LONG TERM GOAL #1   Title Pt. will improve in LEFS by at least 12 points to improve ADL.   Status New   PT LONG TERM GOAL #2   Title Patient will be independent with HEP to strengthen her L foot.   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 She says that she has foot pain that ranges from 3/10 to 5/10 but this is not the main reason for her visit. She has  decreased stnregth to left ankle and will benefit from a HEP to strengthen her foot at home. She has had several months of therapy and is interested in a second opinion. LEFS  is 28/80.   Pt will benefit from skilled therapeutic intervention in order to improve on the following deficits Abnormal gait;Difficulty walking;Increased muscle spasms;Decreased activity tolerance;Decreased strength   Rehab Potential Fair   PT Frequency 2x / week   PT Duration 4 weeks   PT Next Visit Plan HEP for strengthening left ankle   Consulted and Agree with Plan of Care Patient         Problem List Patient Active Problem List   Diagnosis Date Noted  . Abdominal pain, chronic, right lower quadrant 07/27/2012  . Palpitations 09/11/2010  . UNSPECIFIED HYPOTHYROIDISM 05/10/2009  . HYPERLIPIDEMIA-MIXED 05/10/2009  . HYPERTENSION, UNSPECIFIED 05/10/2009    Alanson Puls 08/20/2014, 5:06 PM  [place] MAIN [facility] SERVICES [address] [place], [place], [address] Phone: [phone]   Fax:  [phone]

## 2014-08-22 ENCOUNTER — Encounter: Payer: Managed Care, Other (non HMO) | Admitting: Physical Therapy

## 2014-10-31 ENCOUNTER — Ambulatory Visit (INDEPENDENT_AMBULATORY_CARE_PROVIDER_SITE_OTHER): Payer: Managed Care, Other (non HMO) | Admitting: Family Medicine

## 2014-10-31 ENCOUNTER — Encounter: Payer: Self-pay | Admitting: Family Medicine

## 2014-10-31 VITALS — BP 124/70 | HR 99 | Temp 98.4°F | Resp 16 | Wt 208.0 lb

## 2014-10-31 DIAGNOSIS — R198 Other specified symptoms and signs involving the digestive system and abdomen: Secondary | ICD-10-CM

## 2014-10-31 DIAGNOSIS — K6289 Other specified diseases of anus and rectum: Secondary | ICD-10-CM

## 2014-10-31 NOTE — Progress Notes (Signed)
Name: Desiree Walker   MRN: [id number]    DOB: [birth year]   Date:10/31/2014       Progress Note  Subjective  Chief Complaint  Chief Complaint  Patient presents with  . Abscess    Patient presents with boil near anus that is nonresponsive to antibiotics. Patient states that it has drained but keeps coming back.     HPI   Desiree Walker is a 54 year old female with Hypertension, Hyperlipidemia, Hypothyroidism who is here today to discuss her ongoing concerns regarding a peri-rectal lesion. Previously she would not let me examine the area but is ready to have it addressed. Peri-rectal skin lesion, drains on and off, clear or orange material for many years now. Tried Doxy which did not help. No pain.    Patient Active Problem List   Diagnosis Date Noted  . Rectal mass 10/31/2014  . Abdominal pain, chronic, right lower quadrant 07/27/2012  . Palpitations 09/11/2010  . UNSPECIFIED HYPOTHYROIDISM 05/10/2009  . HYPERLIPIDEMIA-MIXED 05/10/2009  . HYPERTENSION, UNSPECIFIED 05/10/2009    Social History  Substance Use Topics  . Smoking status: Never Smoker   . Smokeless tobacco: Not on file  . Alcohol Use: No     Current outpatient prescriptions:  .  levothyroxine (SYNTHROID, LEVOTHROID) 112 MCG tablet, Take 112 mcg by mouth daily before breakfast., Disp: , Rfl:   Past Surgical History  Procedure Laterality Date  . Dilation and curettage of uterus    . Cesarean section      Family History  Problem Relation Age of Onset  . Hypertension Mother   . Heart disease Mother   . Cancer Sister 47    uterine  . Cancer Maternal Aunt 43    breast    No Known Allergies   Review of Systems  CONSTITUTIONAL: No significant weight changes, fever, chills, weakness or fatigue.  HEENT:  - Eyes: No visual changes.  - Ears: No auditory changes. No pain.  - Nose: No sneezing, congestion, runny nose. - Throat: No sore throat. No changes in swallowing. SKIN: No rash or itching.   CARDIOVASCULAR: No chest pain, chest pressure or chest discomfort. No palpitations or edema.  RESPIRATORY: No shortness of breath, cough or sputum.  GASTROINTESTINAL: No anorexia, nausea, vomiting. No changes in bowel habits. No abdominal pain or blood.  GENITOURINARY: No dysuria. No frequency. No discharge. NEUROLOGICAL: No headache, dizziness, syncope, paralysis, ataxia, numbness or tingling in the extremities. No memory changes. No change in bowel or bladder control.  MUSCULOSKELETAL: No joint pain. No muscle pain. HEMATOLOGIC: No anemia, bleeding or bruising.  LYMPHATICS: No enlarged lymph nodes.  PSYCHIATRIC: No change in mood. No change in sleep pattern.  ENDOCRINOLOGIC: No reports of sweating, cold or heat intolerance. No polyuria or polydipsia.     Objective  BP 124/70 mmHg  Pulse 99  Temp(Src) 98.4 F (36.9 C) (Oral)  Resp 16  Wt 208 lb (94.348 kg)  SpO2 96% Body mass index is 32.57 kg/(m^2).  Physical Exam  Constitutional: Patient appears obese and well-nourished. In no distress.  HEENT:  - Head: Normocephalic and atraumatic.  Neck: Normal range of motion. Neck supple. No JVD present. No thyromegaly present.  Cardiovascular: Normal rate, regular rhythm and normal heart sounds.  No murmur heard.  Pulmonary/Chest: Effort normal and breath sounds normal. No respiratory distress. Genitourinary: normal external genitalia without lesions or discharge. Rectum with normal sphincter tone and no prolapsed mass. Just right of the rectum at the 9 o'clock position there is  a 0.5cm raised stuck on lesion that is pink and flesh toned with central opening. No spontaneous drainage, non tender.  Skin: Skin is warm and dry. No rash noted. No erythema.  Psychiatric: Patient has a stable mood and affect. Behavior is normal in office today. Judgment and thought content normal in office today.  Assessment & Plan  1. Rectal mass Etiologies considered cyst vs fistula vs oncological lesion.  Will consult Dr. Pat Patrick for excision and pathology analysis.   - Ambulatory referral to General Surgery

## 2014-11-01 ENCOUNTER — Encounter: Payer: Self-pay | Admitting: Surgery

## 2014-11-12 ENCOUNTER — Encounter (INDEPENDENT_AMBULATORY_CARE_PROVIDER_SITE_OTHER): Payer: Self-pay

## 2014-11-12 ENCOUNTER — Encounter: Payer: Self-pay | Admitting: *Deleted

## 2014-11-12 ENCOUNTER — Ambulatory Visit (INDEPENDENT_AMBULATORY_CARE_PROVIDER_SITE_OTHER): Payer: Managed Care, Other (non HMO) | Admitting: Surgery

## 2014-11-12 VITALS — BP 159/86 | HR 90 | Temp 98.2°F | Ht 68.0 in | Wt 208.0 lb

## 2014-11-12 DIAGNOSIS — K603 Anal fistula: Secondary | ICD-10-CM

## 2014-11-12 NOTE — Progress Notes (Signed)
  Surgical Consultation  11/12/2014  Desiree Walker is an 54 y.o. female.   Chief Complaint  Patient presents with  . Mass    rectal mass surgery consult     HPI: She is referred for evaluation of a small nodule on the right aspect of her buttocks near her rectum. She had an episode 18 months ago with significant infection and drainage with some bleeding in that area. 2 years ago she had an unremarkable colonoscopy. Proximally month ago this area began to drain small amounts of purulent and bloody material. She's had no other significant symptoms. Her bowel function is been otherwise normal.  Past Medical History  Diagnosis Date  . Hyperlipidemia   . Hypertension   . Palpitations 2012    Evaluated by Ida Rogue, MD, Holter moniter  . Thyroid disease     unspecified hypothyroidism  . Rectal mass   . Left foot pain     Past Surgical History  Procedure Laterality Date  . Dilation and curettage of uterus    . Cesarean section    . Left foot surgery      Family History  Problem Relation Age of Onset  . Hypertension Mother   . Heart disease Mother   . Cancer Sister 81    uterine  . Cancer Maternal Aunt 76    breast    Social History:  reports that she has never smoked. She has never used smokeless tobacco. She reports that she does not drink alcohol or use illicit drugs.  Allergies: No Known Allergies  Medications reviewed.     Review of Systems  Constitutional: Negative for fever, weight loss and malaise/fatigue.  HENT: Negative.   Eyes: Negative.   Respiratory: Negative.   Cardiovascular: Negative.   Gastrointestinal: Positive for constipation and blood in stool.  Genitourinary: Negative.   Musculoskeletal: Negative.   Skin: Negative.   Neurological: Negative.   Psychiatric/Behavioral: Negative.        BP 159/86 mmHg  Pulse 90  Temp(Src) 98.2 F (36.8 C) (Oral)  Ht 5\' 8"  (1.727 m)  Wt 208 lb (94.348 kg)  BMI 31.63 kg/m2  Physical Exam   Constitutional: She is well-developed, well-nourished, and in no distress. No distress.  HENT:  Head: Normocephalic and atraumatic.  Eyes: Conjunctivae are normal. Pupils are equal, round, and reactive to light.  Neck: Normal range of motion. Neck supple.  Cardiovascular: Regular rhythm and normal heart sounds.   Pulmonary/Chest: Effort normal and breath sounds normal.  Abdominal: Soft. Bowel sounds are normal.  Genitourinary:  Visual inspection of her rectum identifies what appears to be a fistulous track along the right lateral aspect. It is draining a small amount of serosanguineous fluid  Skin: Skin is warm and dry.  Psychiatric: Affect and judgment normal.      No results found for this or any previous visit (from the past 48 hour(s)). No results found.  Assessment/Plan: 1. Anal fistula I suspect she has a small anal fistula. I cannot do a visual inspection of the internal opening. She does not have any evidence of active infection. She is currently on antibiotic therapy. I talk with her about the options available in this situation. I would consider an rectal exam under anesthesia with possible fistulotomy. Should the internal opening be very high I would recommend colorectal specialist evaluation. She is in agreement.   Dia Crawford III dermatitis

## 2014-11-15 ENCOUNTER — Telehealth: Payer: Self-pay | Admitting: Surgery

## 2014-11-15 NOTE — Telephone Encounter (Signed)
Pt advised of pre op date/time and sx date. Sx: 11/30/14 with Dr Ely--rectal exam under anesthesia with a possible fistulotomy.  Pre op: 11/22/14 between 9-1pm--phone.

## 2014-11-22 ENCOUNTER — Encounter: Payer: Self-pay | Admitting: *Deleted

## 2014-11-22 ENCOUNTER — Other Ambulatory Visit: Payer: Managed Care, Other (non HMO)

## 2014-11-22 NOTE — Patient Instructions (Signed)
  Your procedure is scheduled on: 11-30-14 Report to [place]  To find out your arrival time please call [phone] between 1PM - 3PM on 11-29-14  Remember: Instructions that are not followed completely may result in serious medical risk, up to and including death, or upon the discretion of your surgeon and anesthesiologist your surgery may need to be rescheduled.    __X__ 1. Do not eat food or drink liquids after midnight. No gum chewing or hard candies.     __X__ 2. No Alcohol for 24 hours before or after surgery.   ____ 3. Bring all medications with you on the day of surgery if instructed.    __X__ 4. Notify your doctor if there is any change in your medical condition     (cold, fever, infections).     Do not wear jewelry, make-up, hairpins, clips or nail polish.  Do not wear lotions, powders, or perfumes. You may wear deodorant.  Do not shave 48 hours prior to surgery. Men may shave face and neck.  Do not bring valuables to the hospital.    [facility] is not responsible for any belongings or valuables.               Contacts, dentures or bridgework may not be worn into surgery.  Leave your suitcase in the car. After surgery it may be brought to your room.  For patients admitted to the hospital, discharge time is determined by your treatment team.   Patients discharged the day of surgery will not be allowed to drive home.   Please read over the following fact sheets that you were given:      __X__ Take these medicines the morning of surgery with A SIP OF WATER:    1. SYNTHROID  2. PEPCID  3.   4.  5.  6.  _X___ Fleet Enema (as directed)-DO FLEETS ENEMA 1 HOUR PRIOR TO ARRIVAL TIME TO HOSPITAL   ____ Use CHG Soap as directed  ____ Use inhalers on the day of surgery  ____ Stop metformin 2 days prior to surgery    ____ Take 1/2 of usual insulin dose the night before surgery and none on the morning of surgery.   ____ Stop  Coumadin/Plavix/aspirin-N/A  ____ Stop Anti-inflammatories-NO NSAIDS OR ASPIRIN PRODUCTS-TYLENOL OK   ____ Stop supplements until after surgery.    ____ Bring C-Pap to the hospital.

## 2014-11-26 ENCOUNTER — Encounter
Admission: RE | Admit: 2014-11-26 | Discharge: 2014-11-26 | Disposition: A | Discharge status: Home / Self Care | Payer: Managed Care, Other (non HMO) | Source: Ambulatory Visit | Attending: Surgery | Admitting: Surgery

## 2014-11-26 DIAGNOSIS — Z01812 Encounter for preprocedural laboratory examination: Secondary | ICD-10-CM | POA: Insufficient documentation

## 2014-11-26 DIAGNOSIS — Z01810 Encounter for preprocedural cardiovascular examination: Secondary | ICD-10-CM | POA: Insufficient documentation

## 2014-11-26 LAB — HEMOGLOBIN A1C: HEMOGLOBIN A1C: 5.5 % (ref 4.0–6.0)

## 2014-11-26 NOTE — Progress Notes (Signed)
Preop Antibiotic Dosing: Consulted to adjust antibiotics for renal function and weight for preop prophylaxis. Patient has orders for Cefazolin 2g IV preop and Metronidazole 500mg  IV preop. Dosing is appropriate for patient's weight. No changes are needed at present. Will follow up on need for renal adjustment.  Paulina Fusi, PharmD, BCPS 11/26/2014 1:52 PM

## 2014-11-27 ENCOUNTER — Ambulatory Visit: Payer: Managed Care, Other (non HMO) | Admitting: Family Medicine

## 2014-11-30 ENCOUNTER — Encounter
Admission: RE | Disposition: A | Discharge status: Home / Self Care | Payer: Self-pay | Source: Ambulatory Visit | Attending: Surgery

## 2014-11-30 ENCOUNTER — Ambulatory Visit: Payer: Managed Care, Other (non HMO) | Admitting: Anesthesiology

## 2014-11-30 ENCOUNTER — Ambulatory Visit
Admission: RE | Admit: 2014-11-30 | Discharge: 2014-11-30 | Disposition: A | Discharge status: Home / Self Care | Payer: Managed Care, Other (non HMO) | Source: Ambulatory Visit | Attending: Surgery | Admitting: Surgery

## 2014-11-30 ENCOUNTER — Other Ambulatory Visit: Payer: Self-pay | Admitting: Surgery

## 2014-11-30 DIAGNOSIS — K603 Anal fistula: Secondary | ICD-10-CM | POA: Diagnosis not present

## 2014-11-30 DIAGNOSIS — Z8049 Family history of malignant neoplasm of other genital organs: Secondary | ICD-10-CM | POA: Insufficient documentation

## 2014-11-30 DIAGNOSIS — I10 Essential (primary) hypertension: Secondary | ICD-10-CM | POA: Diagnosis not present

## 2014-11-30 DIAGNOSIS — E039 Hypothyroidism, unspecified: Secondary | ICD-10-CM | POA: Diagnosis not present

## 2014-11-30 DIAGNOSIS — Z803 Family history of malignant neoplasm of breast: Secondary | ICD-10-CM | POA: Diagnosis not present

## 2014-11-30 DIAGNOSIS — Z8249 Family history of ischemic heart disease and other diseases of the circulatory system: Secondary | ICD-10-CM | POA: Diagnosis not present

## 2014-11-30 DIAGNOSIS — K644 Residual hemorrhoidal skin tags: Secondary | ICD-10-CM | POA: Diagnosis not present

## 2014-11-30 DIAGNOSIS — E785 Hyperlipidemia, unspecified: Secondary | ICD-10-CM | POA: Diagnosis not present

## 2014-11-30 DIAGNOSIS — Z9889 Other specified postprocedural states: Secondary | ICD-10-CM | POA: Diagnosis not present

## 2014-11-30 HISTORY — DX: Nausea with vomiting, unspecified: R11.2

## 2014-11-30 HISTORY — DX: Gastro-esophageal reflux disease without esophagitis: K21.9

## 2014-11-30 HISTORY — PX: ANAL FISTULOTOMY: SHX6423

## 2014-11-30 HISTORY — PX: RECTAL EXAM UNDER ANESTHESIA: SHX6399

## 2014-11-30 HISTORY — DX: Other complications of anesthesia, initial encounter: T88.59XA

## 2014-11-30 HISTORY — DX: Hypothyroidism, unspecified: E03.9

## 2014-11-30 HISTORY — DX: Anemia, unspecified: D64.9

## 2014-11-30 HISTORY — DX: Anxiety disorder, unspecified: F41.9

## 2014-11-30 HISTORY — DX: Adverse effect of unspecified anesthetic, initial encounter: T41.45XA

## 2014-11-30 HISTORY — DX: Other specified postprocedural states: Z98.890

## 2014-11-30 LAB — GLUCOSE, CAPILLARY: GLUCOSE-CAPILLARY: 97 mg/dL (ref 65–99)

## 2014-11-30 SURGERY — EXAM UNDER ANESTHESIA, RECTUM
Anesthesia: General

## 2014-11-30 MED ORDER — ROCURONIUM BROMIDE 100 MG/10ML IV SOLN
INTRAVENOUS | Status: DC | PRN
  Administered 2014-11-30 (×2): 10 mg via INTRAVENOUS

## 2014-11-30 MED ORDER — MIDAZOLAM HCL 5 MG/5ML IJ SOLN
INTRAMUSCULAR | Status: DC | PRN
  Administered 2014-11-30: 2 mg via INTRAVENOUS

## 2014-11-30 MED ORDER — GELATIN ABSORBABLE 12-7 MM EX MISC
CUTANEOUS | Status: DC | PRN
  Administered 2014-11-30: 1

## 2014-11-30 MED ORDER — GELATIN ABSORBABLE 100 CM EX MISC
CUTANEOUS | Status: AC
  Filled 2014-11-30: qty 1

## 2014-11-30 MED ORDER — CEFAZOLIN SODIUM-DEXTROSE 2-3 GM-% IV SOLR
2.0000 g | INTRAVENOUS | Status: AC
  Administered 2014-11-30: 2 g via INTRAVENOUS

## 2014-11-30 MED ORDER — FENTANYL CITRATE (PF) 100 MCG/2ML IJ SOLN
25.0000 ug | INTRAMUSCULAR | Status: DC | PRN
  Administered 2014-11-30 (×4): 25 ug via INTRAVENOUS

## 2014-11-30 MED ORDER — HYDROCODONE-ACETAMINOPHEN 5-325 MG PO TABS: 1.0000 | ORAL_TABLET | Freq: Four times a day (QID) | ORAL | Status: DC | PRN

## 2014-11-30 MED ORDER — ACETAMINOPHEN 10 MG/ML IV SOLN
INTRAVENOUS | Status: DC | PRN
  Administered 2014-11-30: 1000 mg via INTRAVENOUS

## 2014-11-30 MED ORDER — ONDANSETRON 4 MG PO TBDP: 4.0000 mg | ORAL_TABLET | Freq: Three times a day (TID) | ORAL | Status: DC | PRN

## 2014-11-30 MED ORDER — ONDANSETRON HCL 4 MG/2ML IJ SOLN
INTRAMUSCULAR | Status: AC
  Filled 2014-11-30: qty 2

## 2014-11-30 MED ORDER — DEXAMETHASONE SODIUM PHOSPHATE 10 MG/ML IJ SOLN
INTRAMUSCULAR | Status: DC | PRN
  Administered 2014-11-30: 10 mg via INTRAVENOUS

## 2014-11-30 MED ORDER — FENTANYL CITRATE (PF) 100 MCG/2ML IJ SOLN
INTRAMUSCULAR | Status: AC
  Administered 2014-11-30: 25 ug via INTRAVENOUS
  Filled 2014-11-30: qty 2

## 2014-11-30 MED ORDER — ACETAMINOPHEN 10 MG/ML IV SOLN
INTRAVENOUS | Status: AC
  Filled 2014-11-30: qty 100

## 2014-11-30 MED ORDER — ONDANSETRON HCL 4 MG/2ML IJ SOLN
4.0000 mg | Freq: Once | INTRAMUSCULAR | Status: AC | PRN
  Administered 2014-11-30: 4 mg via INTRAVENOUS

## 2014-11-30 MED ORDER — FENTANYL CITRATE (PF) 100 MCG/2ML IJ SOLN
INTRAMUSCULAR | Status: DC | PRN
  Administered 2014-11-30 (×2): 50 ug via INTRAVENOUS

## 2014-11-30 MED ORDER — GELATIN ABSORBABLE 12-7 MM EX MISC
CUTANEOUS | Status: AC
  Filled 2014-11-30: qty 1

## 2014-11-30 MED ORDER — MICROFIBRILLAR COLL HEMOSTAT EX POWD
CUTANEOUS | Status: AC
  Filled 2014-11-30: qty 5

## 2014-11-30 MED ORDER — FLEET ENEMA 7-19 GM/118ML RE ENEM
1.0000 | ENEMA | Freq: Once | RECTAL | Status: AC
  Administered 2014-11-30: 1 via RECTAL

## 2014-11-30 MED ORDER — SUCCINYLCHOLINE CHLORIDE 20 MG/ML IJ SOLN
INTRAMUSCULAR | Status: DC | PRN
  Administered 2014-11-30: 120 mg via INTRAVENOUS

## 2014-11-30 MED ORDER — SUGAMMADEX SODIUM 500 MG/5ML IV SOLN
INTRAVENOUS | Status: DC | PRN
  Administered 2014-11-30: 189.6 mg via INTRAVENOUS

## 2014-11-30 MED ORDER — METRONIDAZOLE IN NACL 5-0.79 MG/ML-% IV SOLN
500.0000 mg | INTRAVENOUS | Status: DC
  Filled 2014-11-30: qty 100

## 2014-11-30 MED ORDER — LIDOCAINE HCL (CARDIAC) 20 MG/ML IV SOLN
INTRAVENOUS | Status: DC | PRN
  Administered 2014-11-30: 80 mg via INTRAVENOUS

## 2014-11-30 MED ORDER — ONDANSETRON HCL 4 MG/2ML IJ SOLN
INTRAMUSCULAR | Status: DC | PRN
Start: 2014-11-30 — End: 2014-11-30
  Administered 2014-11-30: 4 mg via INTRAVENOUS

## 2014-11-30 MED ORDER — CEFAZOLIN SODIUM-DEXTROSE 2-3 GM-% IV SOLR
INTRAVENOUS | Status: AC
  Administered 2014-11-30: 2 g via INTRAVENOUS
  Filled 2014-11-30: qty 50

## 2014-11-30 MED ORDER — BUPIVACAINE LIPOSOME 1.3 % IJ SUSP
INTRAMUSCULAR | Status: AC
  Filled 2014-11-30: qty 20

## 2014-11-30 MED ORDER — MICROFIBRILLAR COLL HEMOSTAT EX POWD
CUTANEOUS | Status: DC | PRN
  Administered 2014-11-30: 1 g via TOPICAL

## 2014-11-30 MED ORDER — BUPIVACAINE LIPOSOME 1.3 % IJ SUSP
INTRAMUSCULAR | Status: DC | PRN
  Administered 2014-11-30: 20 mL

## 2014-11-30 MED ORDER — ENOXAPARIN SODIUM 40 MG/0.4ML ~~LOC~~ SOLN
40.0000 mg | Freq: Once | SUBCUTANEOUS | Status: AC
  Administered 2014-11-30: 40 mg via SUBCUTANEOUS
  Filled 2014-11-30: qty 0.4

## 2014-11-30 MED ORDER — SODIUM CHLORIDE 0.9 % IV SOLN
INTRAVENOUS | Status: DC
  Administered 2014-11-30: 07:00:00 via INTRAVENOUS

## 2014-11-30 MED ORDER — PROPOFOL 10 MG/ML IV BOLUS
INTRAVENOUS | Status: DC | PRN
  Administered 2014-11-30: 180 mg via INTRAVENOUS

## 2014-11-30 SURGICAL SUPPLY — 22 items
CUP MEDICINE 2OZ PLAST GRAD ST (MISCELLANEOUS) ×2 IMPLANT
DRAPE LAPAROSCOPIC ABDOMINAL (DRAPES) IMPLANT
DRAPE LAPAROTOMY 100X77 ABD (DRAPES) ×2 IMPLANT
DRAPE LEGGINS SURG 28X43 STRL (DRAPES) ×2 IMPLANT
DRAPE TABLE BACK 80X90 (DRAPES) IMPLANT
GLOVE BIO SURGEON STRL SZ7.5 (GLOVE) ×4 IMPLANT
GLOVE INDICATOR 8.0 STRL GRN (GLOVE) ×4 IMPLANT
GOWN STRL REUS W/ TWL LRG LVL3 (GOWN DISPOSABLE) ×2 IMPLANT
GOWN STRL REUS W/TWL LRG LVL3 (GOWN DISPOSABLE) ×2
HANDLE YANKAUER SUCT BULB TIP (MISCELLANEOUS) IMPLANT
KIT RM TURNOVER CYSTO AR (KITS) ×2 IMPLANT
LABEL OR SOLS (LABEL) ×2 IMPLANT
OINTMENT BETADINE 1.5GM (MISCELLANEOUS) IMPLANT
PACK BASIN MINOR ARMC (MISCELLANEOUS) ×2 IMPLANT
PAD OB MATERNITY 4.3X12.25 (PERSONAL CARE ITEMS) ×2 IMPLANT
SOL PREP PVP 2OZ (MISCELLANEOUS) ×2
SOLUTION PREP PVP 2OZ (MISCELLANEOUS) ×1 IMPLANT
SPONGE XRAY 4X4 16PLY STRL (MISCELLANEOUS) IMPLANT
SURGILUBE 2OZ TUBE FLIPTOP (MISCELLANEOUS) ×2 IMPLANT
SYR 20CC LL (SYRINGE) ×2 IMPLANT
SYRINGE 10CC LL (SYRINGE) ×2 IMPLANT
TUBING CONNECTING 10 (TUBING) IMPLANT

## 2014-11-30 NOTE — Anesthesia Postprocedure Evaluation (Signed)
  Anesthesia Post-op Note  Patient: Desiree Walker  Procedure(s) Performed: Procedure(s): RECTAL EXAM UNDER ANESTHESIA (N/A) ANAL FISTULOTOMY (N/A)  Anesthesia type:General  Patient location: PACU  Post pain: Pain level controlled  Post assessment: Post-op Vital signs reviewed, Patient's Cardiovascular Status Stable, Respiratory Function Stable, Patent Airway and No signs of Nausea or vomiting  Post vital signs: Reviewed and stable  Last Vitals:  Filed Vitals:   11/30/14 0910  BP: 138/70  Pulse: 80  Temp: 35.8 C  Resp: 16    Level of consciousness: awake, alert  and patient cooperative  Complications: No apparent anesthesia complications

## 2014-11-30 NOTE — Transfer of Care (Signed)
Immediate Anesthesia Transfer of Care Note  Patient: Desiree Walker  Procedure(s) Performed: Procedure(s): RECTAL EXAM UNDER ANESTHESIA (N/A) ANAL FISTULOTOMY (N/A)  Patient Location: PACU  Anesthesia Type:General  Level of Consciousness: awake, alert  and oriented  Airway & Oxygen Therapy: Patient Spontanous Breathing and Patient connected to face mask oxygen  Post-op Assessment: Report given to RN and Post -op Vital signs reviewed and stable  Post vital signs: Reviewed and stable  Last Vitals:  Filed Vitals:   11/30/14 0816  BP: 143/79  Pulse: 101  Temp: 37.3 C  Resp: 14    Complications: No apparent anesthesia complications

## 2014-11-30 NOTE — Op Note (Signed)
11/30/2014  8:10 AM  PATIENT:  Desiree Walker  54 y.o. female  PRE-OPERATIVE DIAGNOSIS:  ANAL FISTULA  POST-OPERATIVE DIAGNOSIS:  anal fistula  PROCEDURE:  Procedure(s): RECTAL EXAM UNDER ANESTHESIA (N/A) ANAL FISTULOTOMY (N/A)  SURGEON:  Surgeon(s) and Role:    * Jeanie Cooks, MD - Primary   ASSISTANTS: none   ANESTHESIA:   general  EBL:      DRAINS: none   LOCAL MEDICATIONS USED:  OTHER Exparel   DISPOSITION OF SPECIMEN:  N/A   DICTATION: .Dragon Dictation with the patient supine position and after induction of appropriate general anesthesia the patient was placed in lithotomy position appropriately padded in position. Her perineal area was prepped with Betadine and draped sterile towels. Examining her rectum on the right side at approximately 9:00 position she had a small nipple with no significant drainage but a scab in the center. Bivalve evaluation of rectum did not identify anything unusual. She did have some mild external hemorrhoids. The nipple was probed and the probe demonstrated a tract toward the rectal mucosa. A heparin needle was utilized to instill some hydrogen peroxide. The internal opening was easily identified by the bubbling of her hydrogen peroxide.  Tract probed in that direction in the internal tract was cannulated. The internal track appeared to be well below the sphincter. Therefore this area was incised down to the probe and hemostasis achieved with Bovie cautery. Track appear to be completely excised. No other descending tracts were identified. The area was irrigated. Exparel placed in the general area for postoperative pain control. Sterile dressing was applied and the patient returned recovery room in satisfactory condition. Sponge instrument needle count were correct 2 in the operating room.  PLAN OF CARE: Discharge to home after PACU  PATIENT DISPOSITION:  PACU - hemodynamically stable.   Dia Crawford III, MD

## 2014-11-30 NOTE — Discharge Instructions (Signed)
AMBULATORY SURGERY  °DISCHARGE INSTRUCTIONS ° ° °1) The drugs that you were given will stay in your system until tomorrow so for the next 24 hours you should not: ° °A) Drive an automobile °B) Make any legal decisions °C) Drink any alcoholic beverage ° ° °2) You may resume regular meals tomorrow.  Today it is better to start with liquids and gradually work up to solid foods. ° °You may eat anything you prefer, but it is better to start with liquids, then soup and crackers, and gradually work up to solid foods. ° ° °3) Please notify your doctor immediately if you have any unusual bleeding, trouble breathing, redness and pain at the surgery site, drainage, fever, or pain not relieved by medication. ° ° ° °4) Additional Instructions: ° ° ° ° ° ° ° °Please contact your physician with any problems or Same Day Surgery at [phone], Monday through Friday 6 am to 4 pm, or [place] [facility] number at [phone].AMBULATORY SURGERY  °DISCHARGE INSTRUCTIONS ° ° °5) The drugs that you were given will stay in your system until tomorrow so for the next 24 hours you should not: ° °D) Drive an automobile °E) Make any legal decisions °F) Drink any alcoholic beverage ° ° °6) You may resume regular meals tomorrow.  Today it is better to start with liquids and gradually work up to solid foods. ° °You may eat anything you prefer, but it is better to start with liquids, then soup and crackers, and gradually work up to solid foods. ° ° °7) Please notify your doctor immediately if you have any unusual bleeding, trouble breathing, redness and pain at the surgery site, drainage, fever, or pain not relieved by medication. ° ° ° °8) Additional Instructions: ° ° ° ° ° ° ° °Please contact your physician with any problems or Same Day Surgery at [phone], Monday through Friday 6 am to 4 pm, or [place] at [place] Main number at [phone]. °

## 2014-11-30 NOTE — Anesthesia Procedure Notes (Signed)
Procedure Name: Intubation Date/Time: 11/30/2014 7:27 AM Performed by: Delaney Meigs Pre-anesthesia Checklist: Patient identified, Emergency Drugs available, Suction available, Patient being monitored and Timeout performed Patient Re-evaluated:Patient Re-evaluated prior to inductionOxygen Delivery Method: Circle system utilized Preoxygenation: Pre-oxygenation with 100% oxygen Intubation Type: IV induction Ventilation: Mask ventilation without difficulty Laryngoscope Size: Mac and 3 Tube type: Oral Tube size: 7.0 mm Number of attempts: 1 Airway Equipment and Method: Stylet Placement Confirmation: ETT inserted through vocal cords under direct vision,  positive ETCO2 and breath sounds checked- equal and bilateral Secured at: 21 cm Tube secured with: Tape Dental Injury: Teeth and Oropharynx as per pre-operative assessment

## 2014-11-30 NOTE — Progress Notes (Signed)

## 2014-11-30 NOTE — Anesthesia Preprocedure Evaluation (Signed)
Anesthesia Evaluation  Patient identified by MRN, date of birth, ID band Patient awake    Reviewed: Allergy & Precautions, NPO status , Patient's Chart, lab work & pertinent test results  History of Anesthesia Complications (+) PONV  Airway Mallampati: III  TM Distance: >3 FB Neck ROM: Full    Dental  (+) Teeth Intact   Pulmonary neg pulmonary ROS,           Cardiovascular hypertension (not on meds), + dysrhythmias (Tachycardia)      Neuro/Psych Anxiety negative neurological ROS     GI/Hepatic GERD  ,  Endo/Other  diabetesHypothyroidism   Renal/GU      Musculoskeletal   Abdominal   Peds  Hematology   Anesthesia Other Findings   Reproductive/Obstetrics                             Anesthesia Physical Anesthesia Plan  ASA: II  Anesthesia Plan: General   Post-op Pain Management:    Induction: Intravenous  Airway Management Planned: LMA  Additional Equipment:   Intra-op Plan:   Post-operative Plan:   Informed Consent: I have reviewed the patients History and Physical, chart, labs and discussed the procedure including the risks, benefits and alternatives for the proposed anesthesia with the patient or authorized representative who has indicated his/her understanding and acceptance.     Plan Discussed with:   Anesthesia Plan Comments:         Anesthesia Quick Evaluation

## 2014-12-01 ENCOUNTER — Encounter: Payer: Self-pay | Admitting: Family Medicine

## 2014-12-03 ENCOUNTER — Telehealth: Payer: Self-pay | Admitting: Surgery

## 2014-12-03 NOTE — Telephone Encounter (Signed)
Returned patient call. Informed patient that she has not been released to work. Release for work will be determined during her appointment on Thursday 12/06/14. Patient confirmed understanding of information.

## 2014-12-03 NOTE — Telephone Encounter (Signed)
Patient had surgery on Friday 9/16 with Dr Pat Patrick - anal fistulotomy, rectal exam under anesthesia. She has a follow up appointment with Dr Pat Patrick on Thursday 9/22. She would like to know if she can return to work before then as it does not state in her discharge instructions when she can return to work and she needs to let her job know something. Also, she has some questions about if she can walk up stairs, etc since surgery. Please call and advise.

## 2014-12-04 ENCOUNTER — Encounter: Payer: Self-pay | Admitting: *Deleted

## 2014-12-06 ENCOUNTER — Ambulatory Visit (INDEPENDENT_AMBULATORY_CARE_PROVIDER_SITE_OTHER): Payer: Managed Care, Other (non HMO) | Admitting: Surgery

## 2014-12-06 ENCOUNTER — Encounter: Payer: Self-pay | Admitting: Surgery

## 2014-12-06 VITALS — BP 164/100 | HR 93 | Temp 97.8°F | Ht 68.0 in | Wt 208.2 lb

## 2014-12-06 DIAGNOSIS — K603 Anal fistula: Secondary | ICD-10-CM

## 2014-12-06 NOTE — Patient Instructions (Signed)
Your disability paperwork has been filled out today. If you need anything more or differently, please call the office.  Please call our office with any questions or concerns and ask to speak with a nurse.

## 2014-12-06 NOTE — Progress Notes (Signed)
Outpatient Surgical Follow Up  12/06/2014  Desiree Walker is an 54 y.o. female.   Chief Complaint  Patient presents with  . Routine Post Op    Rectal Exam under anesthesia with Fistulotomy (Dr. Pat Patrick) - 11/30/14    HPI: She returns for follow-up after her anal fistulotomy. She is doing very well with minimal bleeding and moderate pain. She does not have any diarrhea currently. She is having some mild constipation.  Past Medical History  Diagnosis Date  . Hyperlipidemia   . Palpitations 2012    Evaluated by Ida Rogue, MD, Holter moniter  . Thyroid disease     unspecified hypothyroidism  . Rectal mass   . Left foot pain   . Hypothyroidism   . Diabetes mellitus without complication     PRE-DIABETIC  . Anxiety   . GERD (gastroesophageal reflux disease)     NO MEDS  . Anemia     H/O  . Complication of anesthesia   . PONV (postoperative nausea and vomiting)   . Dysrhythmia     TACHYCARDIA  . Hypertension     NO MEDS CURRENTLY-OFF MEDS SINCE 2015  . Cold 11-2014    PT STATES COLD IS RESOLVING (11-22-14)    Past Surgical History  Procedure Laterality Date  . Dilation and curettage of uterus    . Cesarean section    . Left foot surgery    . Rectal exam under anesthesia N/A 11/30/2014    Procedure: RECTAL EXAM UNDER ANESTHESIA;  Surgeon: Dia Crawford III, MD;  Location: [facility] ORS;  Service: General;  Laterality: N/A;  . Anal fistulotomy N/A 11/30/2014    Procedure: ANAL FISTULOTOMY;  Surgeon: Dia Crawford III, MD;  Location: [facility] OR[facility];  Service: General;  Laterality: N/A;    Family History  Problem Relation Age of Onset  . Hypertension Mother   . Heart disease Mother   . Cancer Sister 43    uterine  . Cancer Maternal Aunt 32    breast    Social History:  reports that she has never smoked. She has never used smokeless tobacco. She reports that she does not drink alcohol or use illicit drugs.  Allergies:  Allergies  Allergen Reactions  . Scopolamine Other (See Comments)   "[place]"    Medications reviewed.    ROS    BP 164/100 mmHg  Pulse 93  Temp(Src) 97.8 F (36.6 C) (Oral)  Ht 5\' 8"  (1.727 m)  Wt 94.439 kg (208 lb 3.2 oz)  BMI 31.66 kg/m2  Physical Exam Her perineal area looks good. There is some mild bleeding and drainage with exam but she does appear to be healing nicely.    No results found for this or any previous visit (from the past 48 hour(s)). No results found.  Assessment/Plan:  1. Anal fistula I suspect she will be off work another week to 10 days possibly as long as 2 weeks. We discussed the healing process. We discussed keeping this area clean. We will see her back in our office as necessary. Overall she's doing quite well.     Dia Crawford III  12/06/2014,negative

## 2014-12-07 ENCOUNTER — Ambulatory Visit: Payer: Managed Care, Other (non HMO) | Admitting: Surgery

## 2014-12-11 ENCOUNTER — Telehealth: Payer: Self-pay

## 2014-12-11 ENCOUNTER — Telehealth: Payer: Self-pay | Admitting: Surgery

## 2014-12-11 NOTE — Telephone Encounter (Signed)
Patient called stating that Desiree Walker [phone]) letting her know that according to her disability paperwork, she was to go back to work until Tuesday 12/18/2014. However, patient wants to go to work on 12/17/2014. I told her that I would reprint her disability paperwork and fill out the part where it should say to return to work on 12/17/2014. I will also fax Desiree her Disability Letter which will say that patient is allowed to return to work on 12/17/2014.

## 2014-12-11 NOTE — Telephone Encounter (Signed)
Patient called stating that she wanted to go back to work on 12/17/2014 instead of 12/24/2014 and needed her paperwork to be changed. After paperwork was changed, to please fax it to her FMLA ([employer]: fax [phone]), supervisor Currie Paris: fax [phone]) and employee health [phone]). I told her that I would go ahead and take care of it.

## 2014-12-12 ENCOUNTER — Telehealth: Payer: Self-pay | Admitting: Surgery

## 2014-12-12 NOTE — Telephone Encounter (Signed)
Returned patient call. Patient wanted to confirm that all of her disability and FMLA paperwork was complete. I confirmed for the patient that all paperwork is up to date.

## 2014-12-12 NOTE — Telephone Encounter (Signed)
Patient has a question about her return to work date/disability. Please call.

## 2014-12-25 ENCOUNTER — Encounter: Payer: Self-pay | Admitting: Family Medicine

## 2014-12-25 ENCOUNTER — Ambulatory Visit (INDEPENDENT_AMBULATORY_CARE_PROVIDER_SITE_OTHER): Payer: Managed Care, Other (non HMO) | Admitting: Family Medicine

## 2014-12-25 VITALS — BP 134/78 | HR 105 | Temp 97.9°F | Resp 16 | Ht 68.0 in | Wt 211.2 lb

## 2014-12-25 DIAGNOSIS — I10 Essential (primary) hypertension: Secondary | ICD-10-CM | POA: Diagnosis not present

## 2014-12-25 MED ORDER — LOSARTAN POTASSIUM 50 MG PO TABS: 75.0000 mg | ORAL_TABLET | Freq: Every day | ORAL | Status: DC

## 2014-12-25 NOTE — Progress Notes (Signed)
Name: Desiree Walker   MRN: [id number]    DOB: 1961-02-12   Date:12/25/2014       Progress Note  Subjective  Chief Complaint  Chief Complaint  Patient presents with  . Hypertension    HPI  Patient is here for routine follow up of Hypertension. First diagnosed with hypertension several years ago. Current anti-hypertension medication regimen includes dietary modification, weight management and Losartan 25 mg a day (she self discontinue HCTZ as it made her urinate too much). To compensate for stopping the HCTZ she has started taking Losartan 25 mg TWO a day. She notes improvement in her blood pressure control. Patient is following physician recommended management. Checking blood pressure outside of physician office. Results average systolic 194-174 and average diastolic 08-14. Associated symptoms do not include headache, dizziness, nausea, lower extremity swelling, shortness of breath, chest pain, numbness.  She has had her surgery to correct her rectal fistula.    Past Medical History  Diagnosis Date  . Hyperlipidemia   . Palpitations 2012    Evaluated by Ida Rogue, MD, Holter moniter  . Thyroid disease     unspecified hypothyroidism  . Rectal mass   . Left foot pain   . Hypothyroidism   . Diabetes mellitus without complication ([place])     PRE-DIABETIC  . Anxiety   . GERD (gastroesophageal reflux disease)     NO MEDS  . Anemia     H/O  . Complication of anesthesia   . PONV (postoperative nausea and vomiting)   . Dysrhythmia     TACHYCARDIA  . Hypertension     NO MEDS CURRENTLY-OFF MEDS SINCE 2015  . Cold 11-2014    PT STATES COLD IS RESOLVING (11-22-14)    Past Surgical History  Procedure Laterality Date  . Dilation and curettage of uterus    . Cesarean section    . Left foot surgery    . Rectal exam under anesthesia N/A 11/30/2014    Procedure: RECTAL EXAM UNDER ANESTHESIA;  Surgeon: Dia Crawford III, MD;  Location: [facility];  Service: General;  Laterality: N/A;  .  Anal fistulotomy N/A 11/30/2014    Procedure: ANAL FISTULOTOMY;  Surgeon: Dia Crawford III, MD;  Location: [facility] OR[facility];  Service: General;  Laterality: N/A;    Family History  Problem Relation Age of Onset  . Hypertension Mother   . Heart disease Mother   . Cancer Sister 40    uterine  . Cancer Maternal Aunt 80    breast    Social History   Social History  . Marital Status: Married    Spouse Name: N/A  . Number of Children: N/A  . Years of Education: N/A   Occupational History  . Not on file.   Social History Main Topics  . Smoking status: Never Smoker   . Smokeless tobacco: Never Used  . Alcohol Use: No  . Drug Use: No  . Sexual Activity: Not on file   Other Topics Concern  . Not on file   Social History Narrative     Current outpatient prescriptions:  .  levothyroxine (SYNTHROID, LEVOTHROID) 112 MCG tablet, Take 112 mcg by mouth daily before breakfast., Disp: , Rfl:  .  losartan (COZAAR) 50 MG tablet, Take 1.5 tablets (75 mg total) by mouth daily., Disp: 135 tablet, Rfl: 2  Allergies  Allergen Reactions  . Scopolamine Other (See Comments)    "FELT LIKE I COULDN'T BREATHE"     ROS  CONSTITUTIONAL: No significant weight changes,  fever, chills, weakness or fatigue.  HEENT:  - Eyes: No visual changes.  - Ears: No auditory changes. No pain.  - Nose: No sneezing, congestion, runny nose. - Throat: No sore throat. No changes in swallowing. SKIN: No rash or itching.  CARDIOVASCULAR: No chest pain, chest pressure or chest discomfort. No palpitations or edema.  RESPIRATORY: No shortness of breath, cough or sputum.  NEUROLOGICAL: No headache, dizziness, syncope, paralysis, ataxia, numbness or tingling in the extremities. No memory changes. No change in bowel or bladder control.  MUSCULOSKELETAL: No joint pain. No muscle pain. ENDOCRINOLOGIC: No reports of sweating, cold or heat intolerance. No polyuria or polydipsia.   Objective  Filed Vitals:   12/25/14 1631  BP:  134/78  Pulse: 105  Temp: 97.9 F (36.6 C)  Resp: 16  Height: 5\' 8"  (1.727 m)  Weight: 211 lb 4 oz (95.822 kg)  SpO2: 97%   Body mass index is 32.13 kg/(m^2).  Physical Exam  Constitutional: Patient appears overweight and well-nourished. In no distress.  Cardiovascular: Normal rate, regular rhythm and normal heart sounds.  No murmur heard.  Pulmonary/Chest: Effort normal and breath sounds normal. No respiratory distress. Musculoskeletal: Normal range of motion bilateral UE and LE, no joint effusions. Walks with a slight limp in her step. Peripheral vascular: Bilateral LE no edema. Psychiatric: Patient has a normal mood and affect. Behavior is normal in office today. Judgment and thought content normal in office today.   Assessment & Plan  1. Hypertension goal BP (blood pressure) < 140/90  Increased Losartan to 75 mg a day.  - losartan (COZAAR) 50 MG tablet; Take 1.5 tablets (75 mg total) by mouth daily.  Dispense: 135 tablet; Refill: 2

## 2015-07-29 ENCOUNTER — Ambulatory Visit: Payer: Managed Care, Other (non HMO) | Admitting: Podiatry

## 2015-08-05 ENCOUNTER — Encounter: Payer: Self-pay | Admitting: Family Medicine

## 2015-08-05 ENCOUNTER — Ambulatory Visit (INDEPENDENT_AMBULATORY_CARE_PROVIDER_SITE_OTHER): Payer: Managed Care, Other (non HMO) | Admitting: Family Medicine

## 2015-08-05 VITALS — BP 124/78 | HR 95 | Temp 98.5°F | Resp 14 | Wt 217.0 lb

## 2015-08-05 DIAGNOSIS — R6882 Decreased libido: Secondary | ICD-10-CM | POA: Diagnosis not present

## 2015-08-05 DIAGNOSIS — E039 Hypothyroidism, unspecified: Secondary | ICD-10-CM | POA: Diagnosis not present

## 2015-08-05 DIAGNOSIS — R5383 Other fatigue: Secondary | ICD-10-CM | POA: Diagnosis not present

## 2015-08-05 DIAGNOSIS — R252 Cramp and spasm: Secondary | ICD-10-CM

## 2015-08-05 DIAGNOSIS — Z5181 Encounter for therapeutic drug level monitoring: Secondary | ICD-10-CM | POA: Diagnosis not present

## 2015-08-05 DIAGNOSIS — E785 Hyperlipidemia, unspecified: Secondary | ICD-10-CM | POA: Diagnosis not present

## 2015-08-05 DIAGNOSIS — D414 Neoplasm of uncertain behavior of bladder: Secondary | ICD-10-CM

## 2015-08-05 DIAGNOSIS — R7301 Impaired fasting glucose: Secondary | ICD-10-CM | POA: Diagnosis not present

## 2015-08-05 DIAGNOSIS — I10 Essential (primary) hypertension: Secondary | ICD-10-CM | POA: Diagnosis not present

## 2015-08-05 DIAGNOSIS — R002 Palpitations: Secondary | ICD-10-CM | POA: Diagnosis not present

## 2015-08-05 HISTORY — DX: Impaired fasting glucose: R73.01
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
## 2015-08-05 NOTE — Assessment & Plan Note (Signed)
Fabulous today; DASH guidelines

## 2015-08-05 NOTE — Assessment & Plan Note (Signed)
Check labs 

## 2015-08-05 NOTE — Assessment & Plan Note (Signed)
Check lipids 

## 2015-08-05 NOTE — Assessment & Plan Note (Signed)
Had cystoscopy, pt will call to schedule f/u with [facility] doctor

## 2015-08-05 NOTE — Patient Instructions (Addendum)
Try to limit saturated fats in your diet (bologna, hot dogs, barbeque, cheeseburgers, hamburgers, steak, bacon, sausage, cheese, etc.) and get more fresh fruits, vegetables, and whole grains Let's get labs (fasting) tomorrow or sometime soon  Check out the information at familydoctor.org entitled "Nutrition for Weight Loss: What You Need to Know about Fad Diets" Try to lose between 1-2 pounds per week by taking in fewer calories and burning off more calories You can succeed by limiting portions, limiting foods dense in calories and fat, becoming more active, and drinking 8 glasses of water a day (64 ounces) Don't skip meals, especially breakfast, as skipping meals may alter your metabolism Do not use over-the-counter weight loss pills or gimmicks that claim rapid weight loss A healthy BMI (or body mass index) is between 18.5 and 24.9 You can calculate your ideal BMI at the [place] website ClubMonetize.fr  Ask husband if you stop breathing and if so, let's get a sleep study  Do start to exercise and make it fun   DASH Eating Plan DASH stands for "Dietary Approaches to Stop Hypertension." The DASH eating plan is a healthy eating plan that has been shown to reduce high blood pressure (hypertension). Additional health benefits may include reducing the risk of type 2 diabetes mellitus, heart disease, and stroke. The DASH eating plan may also help with weight loss. WHAT DO I NEED TO KNOW ABOUT THE DASH EATING PLAN? For the DASH eating plan, you will follow these general guidelines:  Choose foods with a percent daily value for sodium of less than 5% (as listed on the food label).  Use salt-free seasonings or herbs instead of table salt or sea salt.  Check with your health care provider or pharmacist before using salt substitutes.  Eat lower-sodium products, often labeled as "lower sodium" or "no salt added."  Eat fresh foods.  Eat more  vegetables, fruits, and low-fat dairy products.  Choose whole grains. Look for the word "whole" as the first word in the ingredient list.  Choose fish and skinless chicken or [place] more often than red meat. Limit fish, poultry, and meat to 6 oz (170 g) each day.  Limit sweets, desserts, sugars, and sugary drinks.  Choose heart-healthy fats.  Limit cheese to 1 oz (28 g) per day.  Eat more home-cooked food and less restaurant, buffet, and fast food.  Limit fried foods.  Cook foods using methods other than frying.  Limit canned vegetables. If you do use them, rinse them well to decrease the sodium.  When eating at a restaurant, ask that your food be prepared with less salt, or no salt if possible. WHAT FOODS CAN I EAT? Seek help from a dietitian for individual calorie needs. Grains Whole grain or whole wheat bread. Brown rice. Whole grain or whole wheat pasta. Quinoa, bulgur, and whole grain cereals. Low-sodium cereals. Corn or whole wheat flour tortillas. Whole grain cornbread. Whole grain crackers. Low-sodium crackers. Vegetables Fresh or frozen vegetables (raw, steamed, roasted, or grilled). Low-sodium or reduced-sodium tomato and vegetable juices. Low-sodium or reduced-sodium tomato sauce and paste. Low-sodium or reduced-sodium canned vegetables.  Fruits All fresh, canned (in natural juice), or frozen fruits. Meat and Other Protein Products Ground beef (85% or leaner), grass-fed beef, or beef trimmed of fat. Skinless chicken or [place]. Ground chicken or [place]. Pork trimmed of fat. All fish and seafood. Eggs. Dried beans, peas, or lentils. Unsalted nuts and seeds. Unsalted canned beans. Dairy Low-fat dairy products, such as skim or 1% milk, 2% or  reduced-fat cheeses, low-fat ricotta or cottage cheese, or plain low-fat yogurt. Low-sodium or reduced-sodium cheeses. Fats and Oils Tub margarines without trans fats. Light or reduced-fat mayonnaise and salad dressings (reduced sodium).  Avocado. Safflower, olive, or canola oils. Natural peanut or almond butter. Other Unsalted popcorn and pretzels. The items listed above may not be a complete list of recommended foods or beverages. Contact your dietitian for more options. WHAT FOODS ARE NOT RECOMMENDED? Grains White bread. White pasta. White rice. Refined cornbread. Bagels and croissants. Crackers that contain trans fat. Vegetables Creamed or fried vegetables. Vegetables in a cheese sauce. Regular canned vegetables. Regular canned tomato sauce and paste. Regular tomato and vegetable juices. Fruits Dried fruits. Canned fruit in light or heavy syrup. Fruit juice. Meat and Other Protein Products Fatty cuts of meat. Ribs, chicken wings, bacon, sausage, bologna, salami, chitterlings, fatback, hot dogs, bratwurst, and packaged luncheon meats. Salted nuts and seeds. Canned beans with salt. Dairy Whole or 2% milk, cream, half-and-half, and cream cheese. Whole-fat or sweetened yogurt. Full-fat cheeses or blue cheese. Nondairy creamers and whipped toppings. Processed cheese, cheese spreads, or cheese curds. Condiments Onion and garlic salt, seasoned salt, table salt, and sea salt. Canned and packaged gravies. Worcestershire sauce. Tartar sauce. Barbecue sauce. Teriyaki sauce. Soy sauce, including reduced sodium. Steak sauce. Fish sauce. Oyster sauce. Cocktail sauce. Horseradish. Ketchup and mustard. Meat flavorings and tenderizers. Bouillon cubes. Hot sauce. Tabasco sauce. Marinades. Taco seasonings. Relishes. Fats and Oils Butter, stick margarine, lard, shortening, ghee, and bacon fat. Coconut, palm kernel, or palm oils. Regular salad dressings. Other Pickles and olives. Salted popcorn and pretzels. The items listed above may not be a complete list of foods and beverages to avoid. Contact your dietitian for more information. WHERE CAN I FIND MORE INFORMATION? National Heart, Lung, and Blood Institute:  travelstabloid.com   This information is not intended to replace advice given to you by your health care provider. Make sure you discuss any questions you have with your health care provider.   Document Released: 02/19/2011 Document Revised: 03/23/2014 Document Reviewed: 01/04/2013 Elsevier Interactive Patient Education [employer].

## 2015-08-07 LAB — CBC WITH DIFFERENTIAL/PLATELET
BASOS: 1 %
Basophils Absolute: 0 10*3/uL (ref 0.0–0.2)
EOS (ABSOLUTE): 0.2 10*3/uL (ref 0.0–0.4)
EOS: 4 %
HEMATOCRIT: 40.7 % (ref 34.0–46.6)
Hemoglobin: 13.2 g/dL (ref 11.1–15.9)
IMMATURE GRANULOCYTES: 0 %
Immature Grans (Abs): 0 10*3/uL (ref 0.0–0.1)
LYMPHS ABS: 1.6 10*3/uL (ref 0.7–3.1)
Lymphs: 38 %
MCH: 26 pg — ABNORMAL LOW (ref 26.6–33.0)
MCHC: 32.4 g/dL (ref 31.5–35.7)
MCV: 80 fL (ref 79–97)
MONOS ABS: 0.2 10*3/uL (ref 0.1–0.9)
Monocytes: 5 %
Neutrophils Absolute: 2.3 10*3/uL (ref 1.4–7.0)
Neutrophils: 52 %
Platelets: 206 10*3/uL (ref 150–379)
RBC: 5.07 x10E6/uL (ref 3.77–5.28)
RDW: 14.5 % (ref 12.3–15.4)
WBC: 4.3 10*3/uL (ref 3.4–10.8)

## 2015-08-07 LAB — LIPID PANEL W/O CHOL/HDL RATIO
Cholesterol, Total: 222 mg/dL — ABNORMAL HIGH (ref 100–199)
HDL: 53 mg/dL (ref >39)
LDL Calculated: 139 mg/dL — ABNORMAL HIGH (ref 0–99)
Triglycerides: 149 mg/dL (ref 0–149)
VLDL Cholesterol Cal: 30 mg/dL (ref 5–40)

## 2015-08-07 LAB — TSH: TSH: 1.34 u[IU]/mL (ref 0.450–4.500)

## 2015-08-07 LAB — HEMOGLOBIN A1C
Est. average glucose Bld gHb Est-mCnc: 128 mg/dL
Hgb A1c MFr Bld: 6.1 % — ABNORMAL HIGH (ref 4.8–5.6)

## 2015-08-07 LAB — VITAMIN D 25 HYDROXY (VIT D DEFICIENCY, FRACTURES): VIT D 25 HYDROXY: 18.6 ng/mL — AB (ref 30.0–100.0)

## 2015-08-07 LAB — MAGNESIUM: MAGNESIUM: 2 mg/dL (ref 1.6–2.3)

## 2015-08-07 LAB — T4, FREE: Free T4: 1.55 ng/dL (ref 0.82–1.77)

## 2015-08-14 ENCOUNTER — Encounter: Payer: Self-pay | Admitting: Family Medicine

## 2015-08-19 ENCOUNTER — Encounter: Payer: Self-pay | Admitting: Physician Assistant

## 2015-08-19 ENCOUNTER — Telehealth: Payer: Self-pay | Admitting: Family Medicine

## 2015-08-19 ENCOUNTER — Ambulatory Visit: Payer: Self-pay | Admitting: Family

## 2015-08-19 VITALS — BP 124/80 | HR 80 | Temp 98.5°F

## 2015-08-19 DIAGNOSIS — R300 Dysuria: Secondary | ICD-10-CM

## 2015-08-19 DIAGNOSIS — E559 Vitamin D deficiency, unspecified: Secondary | ICD-10-CM | POA: Insufficient documentation

## 2015-08-19 LAB — POCT URINALYSIS DIPSTICK
BILIRUBIN UA: NEGATIVE
Blood, UA: NEGATIVE
Glucose, UA: NEGATIVE
Ketones, UA: NEGATIVE
LEUKOCYTES UA: NEGATIVE
NITRITE UA: NEGATIVE
Protein, UA: NEGATIVE
Spec Grav, UA: >=1.03
Urobilinogen, UA: 0.2
pH, UA: 6

## 2015-08-19 MED ORDER — VITAMIN D (ERGOCALCIFEROL) 1.25 MG (50000 UNIT) PO CAPS: 50000.0000 [IU] | ORAL_CAPSULE | ORAL | Status: AC

## 2015-08-19 NOTE — Telephone Encounter (Signed)
I left another msg

## 2015-08-19 NOTE — Progress Notes (Signed)
S / in today to rule out uti , had trouble starting her stream this am  , a vague soreness to her R groin, with strain or injury,   she is followed by urology and has an appt next week, denies , fever , chills, cp or SOB , GI , GU OR GYN c/o s  O/ VSS alert pleasant  NAD Heart RSR Lungs clear Abd soft nontender no mass or organomegally  U/A yellow clear all negative A/ dysuria with normal dip   P / assure good water intake,avoid caffeine, carbonated drinks. Keep upcoming appt . Seek urgent care for danger signs - reviewed.

## 2015-08-19 NOTE — Telephone Encounter (Signed)
I called, reached voicemail again; ideal LDL is under 130 for most people, under 100 for individuals with diabetes or CAD; we'll talk about her goal when we catch each other Vit D is low; start Rx weekly for 4 weeks, then OTC (I'll instruct her to pick up 5,000 iu and take daily for one month after the Rx, then twice a week) A1c is prediabetes I'll try her again later, but wanted to at least leave some info for her since I've missed her

## 2015-08-19 NOTE — Telephone Encounter (Signed)
I called about lab results; left msg

## 2015-08-19 NOTE — Telephone Encounter (Signed)
Patient sent my chart message on 08-14-15 pertaining to the lab results that was in her chart. As of today no one has responded to her message nor have they called to give her the results. Please return call. She do not want to speak with the nurse but is asking for a direct call from Dr Sanda Klein.

## 2015-08-20 NOTE — Telephone Encounter (Signed)
I tried again, left message

## 2015-08-21 NOTE — Telephone Encounter (Signed)
I spoke with patient; she talked about her struggle with eating, trying to eat healthy, steel cut oatmeal, salads instead of hamburgers, lots better 50k weekly x 1 month, then 5,000 iu a day for 1 month, 1,000 iu daily after that

## 2015-08-27 ENCOUNTER — Encounter: Payer: Self-pay | Admitting: Family Medicine

## 2015-08-27 ENCOUNTER — Telehealth: Payer: Self-pay | Admitting: Family Medicine

## 2015-08-27 DIAGNOSIS — R0683 Snoring: Secondary | ICD-10-CM | POA: Insufficient documentation

## 2015-08-27 NOTE — Telephone Encounter (Signed)
Patient was going to ask her husband if she stopped breathing She has snoring I have not heard back, so I'm taking the liberty of referring her for possible sleep apnea

## 2015-08-29 DIAGNOSIS — G4730 Sleep apnea, unspecified: Secondary | ICD-10-CM | POA: Insufficient documentation

## 2015-09-30 ENCOUNTER — Encounter: Payer: Self-pay | Admitting: Internal Medicine

## 2015-10-15 ENCOUNTER — Encounter: Payer: Self-pay | Admitting: Internal Medicine

## 2015-10-15 ENCOUNTER — Ambulatory Visit (INDEPENDENT_AMBULATORY_CARE_PROVIDER_SITE_OTHER): Payer: Managed Care, Other (non HMO) | Admitting: Internal Medicine

## 2015-10-15 ENCOUNTER — Other Ambulatory Visit: Payer: Self-pay

## 2015-10-15 VITALS — BP 136/86 | HR 79 | Ht 67.0 in | Wt 217.0 lb

## 2015-10-15 DIAGNOSIS — G4719 Other hypersomnia: Secondary | ICD-10-CM

## 2015-10-15 DIAGNOSIS — I10 Essential (primary) hypertension: Secondary | ICD-10-CM

## 2015-10-15 DIAGNOSIS — R7301 Impaired fasting glucose: Secondary | ICD-10-CM

## 2015-10-15 MED ORDER — LOSARTAN POTASSIUM 50 MG PO TABS: 75.0000 mg | ORAL_TABLET | Freq: Every day | ORAL | 0 refills | Status: DC

## 2015-10-15 MED ORDER — LEVOTHYROXINE SODIUM 112 MCG PO TABS: 112.0000 ug | ORAL_TABLET | Freq: Every day | ORAL | 1 refills | Status: DC

## 2015-10-15 NOTE — Assessment & Plan Note (Signed)
check

## 2015-10-15 NOTE — Progress Notes (Signed)
[facility] Consultation      Assessment and Plan:  Excessive daytime sleepiness.  -Symptoms and signs consistent with obstructive sleep apnea, we'll send for sleep study.  Essential hypertension. -Patient is currently on therapy for hypertension, sleep apnea can contribute to elevated hypertension. Therefore, it is important to adequately treat underlying sleep apnea is present.  Obesity. -May be contributing to the patient's sleep apnea, discussed that weight loss may be beneficial for her health.    Date: 10/15/2015  MRN# [id number] Desiree Walker August 18, 1960  Referring Physician:   ALEIA MORROBEL is a 55 y.o. old female seen in consultation for chief complaint of:    Chief Complaint  Patient presents with  . Advice Only    sleep ref by Lada: gasping for air; tiredness at times during the day.    HPI:   The patient is a 55 year old female presents with complaint of a occasionally waking up choking or coughing. She typically goes to bed between 9 and 10 PM, she falls asleep within 20-30 minutes. She wakes up 2-3 times per night, she wakes up, start her day at 5:30 AM.  She was seeing her urologist and noted that she has symptoms consistent with OSA. Her husband says that she snores. No sleepwalking, no sleep attacks. Her brother has OSA.    PMHX:   Past Medical History:  Diagnosis Date  . Anemia    H/O  . Anxiety   . Complication of anesthesia   . GERD (gastroesophageal reflux disease)    NO MEDS  . Hyperlipidemia   . Hypertension    NO MEDS CURRENTLY-OFF MEDS SINCE 2015  . Hypothyroidism   . IFG (impaired fasting glucose) 08/05/2015  . Palpitations 2012   Evaluated by Ida Rogue, MD, Holter moniter  . PONV (postoperative nausea and vomiting)   . Rectal mass   . Thyroid disease    unspecified hypothyroidism   Surgical Hx:  Past Surgical History:  Procedure Laterality Date  . ANAL FISTULOTOMY N/A 11/30/2014   Procedure: ANAL  FISTULOTOMY;  Surgeon: Dia Crawford III, MD;  Location: [facility] ORS;  Service: General;  Laterality: N/A;  . CESAREAN SECTION    . DILATION AND CURETTAGE OF UTERUS    . left foot surgery    . RECTAL EXAM UNDER ANESTHESIA N/A 11/30/2014   Procedure: RECTAL EXAM UNDER ANESTHESIA;  Surgeon: Dia Crawford III, MD;  Location: [facility] ORS;  Service: General;  Laterality: N/A;   Family Hx:  Family History  Problem Relation Age of Onset  . Hypertension Mother   . Heart disease Mother 44    "blockages"  . Cancer Sister 7    uterine  . Cancer Maternal Aunt 18    breast  . Hypothyroidism Daughter    Social Hx:   Social History  Substance Use Topics  . Smoking status: Never Smoker  . Smokeless tobacco: Never Used  . Alcohol use No   Medication:   Reviewed    Allergies:  Scopolamine  Review of Systems: Gen:  Denies  fever, sweats, chills HEENT: Denies blurred vision, double vision.  Cvc:  No dizziness, chest pain. Resp:   Denies cough or sputum production, shortness of breath Gi: Denies swallowing difficulty, stomach pain. Gu:  Denies bladder incontinence, burning urine Ext:   No Joint pain, stiffness. Skin: No skin rash,  hives  Endoc:  No polyuria, polydipsia. Psych: No depression, insomnia. Other:  All other systems were reviewed with the patient and were negative other  that what is mentioned in the HPI.   Physical Examination:   VS: BP 136/86 (BP Location: Left Arm, Cuff Size: Normal)   Pulse 79   Ht 5\' 7"  (1.702 m)   Wt 217 lb (98.4 kg)   SpO2 97%   BMI 33.99 kg/m   General Appearance: No distress  Neuro:without focal findings,  speech normal,  HEENT: PERRLA, EOM intact.  [place] 3.  Pulmonary: normal breath sounds, No wheezing.  CardiovascularNormal S1,S2.  No m/r/g.   Abdomen: Benign, Soft, non-tender. Renal:  No costovertebral tenderness  GU:  No performed at this time. Endoc: No evident thyromegaly, no signs of acromegaly. Skin:   warm, no rashes, no ecchymosis    Extremities: normal, no cyanosis, clubbing.  Other findings:    LABORATORY PANEL:   CBC No results for input(s): WBC, HGB, HCT, PLT in the last 168 hours. ------------------------------------------------------------------------------------------------------------------  Chemistries  No results for input(s): NA, K, CL, CO2, GLUCOSE, BUN, CREATININE, CALCIUM, MG, AST, ALT, ALKPHOS, BILITOT in the last 168 hours.  Invalid input(s): GFRCGP ------------------------------------------------------------------------------------------------------------------  Cardiac Enzymes No results for input(s): TROPONINI in the last 168 hours. ------------------------------------------------------------  RADIOLOGY:  No results found.     Thank  you for the consultation and for allowing [facility] to assist in the care of your patient. Our recommendations are noted above.  Please contact us if we can be of further service.   Marda Stalker, MD.  Board Certified in Internal Medicine, Pulmonary Medicine, [place], and Sleep Medicine.  [facility] Office Number: [phone]  Patricia Pesa, M.D.  Vilinda Boehringer, M.D.  Merton Border, M.D  10/15/2015   Cc: Dr. Andris Baumann.

## 2015-10-15 NOTE — Patient Instructions (Addendum)
Will send for sleep study.   Weight loss may be beneficial.

## 2015-10-15 NOTE — Telephone Encounter (Signed)
Left voice mail

## 2015-10-15 NOTE — Telephone Encounter (Signed)
I don't see a potassium or creatinine on the chart recently; not part of last set of labs, I'm so sorry; please ask her to have a BMP done in the next few days; I sent refills

## 2015-10-16 ENCOUNTER — Other Ambulatory Visit: Payer: Self-pay | Admitting: Obstetrics & Gynecology

## 2015-10-16 ENCOUNTER — Other Ambulatory Visit: Payer: Self-pay | Admitting: Family Medicine

## 2015-10-16 ENCOUNTER — Telehealth: Payer: Managed Care, Other (non HMO) | Admitting: Nurse Practitioner

## 2015-10-16 DIAGNOSIS — Z1231 Encounter for screening mammogram for malignant neoplasm of breast: Secondary | ICD-10-CM

## 2015-10-16 DIAGNOSIS — N3000 Acute cystitis without hematuria: Secondary | ICD-10-CM

## 2015-10-16 MED ORDER — CIPROFLOXACIN HCL 500 MG PO TABS: 500.0000 mg | ORAL_TABLET | Freq: Two times a day (BID) | ORAL | 0 refills | Status: DC

## 2015-10-16 NOTE — Progress Notes (Signed)

## 2015-10-17 ENCOUNTER — Institutional Professional Consult (permissible substitution): Payer: Self-pay | Admitting: Internal Medicine

## 2015-10-18 ENCOUNTER — Telehealth: Payer: Self-pay | Admitting: Internal Medicine

## 2015-10-18 DIAGNOSIS — G4719 Other hypersomnia: Secondary | ICD-10-CM

## 2015-10-18 NOTE — Telephone Encounter (Signed)
Ria Comment with [facility] called stating that per pt's insurance Scientist, clinical (histocompatibility and immunogenetics)) home Auto-Pap is the preferred study instead of in lab CPAP Titration Study.  If Auto-Pap is suitable please specify Range and how long with download to be faxed in referral. Catha Gosselin

## 2015-10-21 NOTE — Telephone Encounter (Signed)
What was ordered for patient was a Split Night, not CPAP Titration Study.  Per pt's insurance Scientist, clinical (histocompatibility and immunogenetics)) will not approve in lab study so an order for a HST will need to be ordered instead of a Auto CPAP.  Please route to nurse to place HST instead of Split Night per insurance request. Catha Gosselin

## 2015-10-21 NOTE — Telephone Encounter (Signed)
Order placed for HST 

## 2015-10-21 NOTE — Telephone Encounter (Signed)
Please place order for HST

## 2015-10-25 ENCOUNTER — Ambulatory Visit: Payer: Managed Care, Other (non HMO)

## 2015-11-27 ENCOUNTER — Ambulatory Visit: Payer: Managed Care, Other (non HMO) | Admitting: Family Medicine

## 2015-11-29 ENCOUNTER — Ambulatory Visit: Payer: Managed Care, Other (non HMO) | Admitting: Family Medicine

## 2015-12-17 ENCOUNTER — Telehealth: Payer: Self-pay

## 2015-12-17 ENCOUNTER — Encounter: Payer: Self-pay | Admitting: Family Medicine

## 2015-12-17 ENCOUNTER — Ambulatory Visit (INDEPENDENT_AMBULATORY_CARE_PROVIDER_SITE_OTHER): Payer: Managed Care, Other (non HMO) | Admitting: Family Medicine

## 2015-12-17 VITALS — BP 118/86 | HR 96 | Temp 97.6°F | Resp 14 | Wt 211.7 lb

## 2015-12-17 DIAGNOSIS — Z6833 Body mass index (BMI) 33.0-33.9, adult: Secondary | ICD-10-CM | POA: Diagnosis not present

## 2015-12-17 DIAGNOSIS — E6609 Other obesity due to excess calories: Secondary | ICD-10-CM | POA: Diagnosis not present

## 2015-12-17 DIAGNOSIS — E669 Obesity, unspecified: Secondary | ICD-10-CM | POA: Insufficient documentation

## 2015-12-17 DIAGNOSIS — F419 Anxiety disorder, unspecified: Secondary | ICD-10-CM | POA: Insufficient documentation

## 2015-12-17 DIAGNOSIS — F418 Other specified anxiety disorders: Secondary | ICD-10-CM | POA: Diagnosis not present

## 2015-12-17 DIAGNOSIS — R0789 Other chest pain: Secondary | ICD-10-CM

## 2015-12-17 DIAGNOSIS — I10 Essential (primary) hypertension: Secondary | ICD-10-CM | POA: Diagnosis not present

## 2015-12-17 DIAGNOSIS — E039 Hypothyroidism, unspecified: Secondary | ICD-10-CM | POA: Diagnosis not present

## 2015-12-17 DIAGNOSIS — E559 Vitamin D deficiency, unspecified: Secondary | ICD-10-CM

## 2015-12-17 MED ORDER — SERTRALINE HCL 50 MG PO TABS: ORAL_TABLET | ORAL | 0 refills | Status: DC

## 2015-12-17 NOTE — Assessment & Plan Note (Signed)
Controlled today 

## 2015-12-17 NOTE — Patient Instructions (Addendum)
We'll get labs today We'll refer to you a cardiologist If you have not heard anything from my staff in a week about any orders/referrals/studies from today, please contact us here to follow-up [phone]  Start taking one coated baby 81 mg aspirin daily If you experience chest pressure again, call 911  Check out the information at familydoctor.org entitled "Nutrition for Weight Loss: What You Need to Know about Fad Diets" Try to lose between 1-2 pounds per week by taking in fewer calories and burning off more calories You can succeed by limiting portions, limiting foods dense in calories and fat, becoming more active, and drinking 8 glasses of water a day (64 ounces) Don't skip meals, especially breakfast, as skipping meals may alter your metabolism Do not use over-the-counter weight loss pills or gimmicks that claim rapid weight loss A healthy BMI (or body mass index) is between 18.5 and 24.9 You can calculate your ideal BMI at the [place] website ClubMonetize.fr  Start sertraline for anxiety; return in 3 weeks for follow-up Start working with a counselor to see if cognitive behavioral therapy would be helpful for you  12 Ways to Curb Anxiety  ?Anxiety is normal human sensation. It is what helped our ancestors survive the pitfalls of the wilderness. Anxiety is defined as experiencing worry or nervousness about an imminent event or something with an uncertain outcome. It is a feeling experienced by most people at some point in their lives. Anxiety can be triggered by a very personal issue, such as the illness of a loved one, or an event of global proportions, such as a refugee crisis. Some of the symptoms of anxiety are:  Feeling restless.  Having a feeling of impending danger.  Increased heart rate.  Rapid breathing. Sweating.  Shaking.  Weakness or feeling tired.  Difficulty concentrating on anything except the current worry.   Insomnia.  Stomach or bowel problems. What can we do about anxiety we may be feeling? There are many techniques to help manage stress and relax. Here are 12 ways you can reduce your anxiety almost immediately: 1. Turn off the constant feed of information. Take a social media sabbatical. Studies have shown that social media directly contributes to social anxiety.  2. Monitor your television viewing habits. Are you watching shows that are also contributing to your anxiety, such as 24-hour news stations? Try watching something else, or better yet, nothing at all. Instead, listen to music, read an inspirational book or practice a hobby. 3. Eat nutritious meals. Also, don't skip meals and keep healthful snacks on hand. Hunger and poor diet contributes to feeling anxious. 4. Sleep. Sleeping on a regular schedule for at least seven to eight hours a night will do wonders for your outlook when you are awake. 5. Exercise. Regular exercise will help rid your body of that anxious energy and help you get more restful sleep. 6. Try deep (diaphragmatic) breathing. Inhale slowly through your nose for five seconds and exhale through your mouth. 7. Practice acceptance and gratitude. When anxiety hits, accept that there are things out of your control that shouldn't be of immediate concern.  8. Seek out humor. When anxiety strikes, watch a funny video, read jokes or call a friend who makes you laugh. Laughter is healing for our bodies and releases endorphins that are calming. 9. Stay positive. Take the effort to replace negative thoughts with positive ones. Try to see a stressful situation in a positive light. Try to come up with solutions rather than  dwelling on the problem. 10. Figure out what triggers your anxiety. Keep a journal and make note of anxious moments and the events surrounding them. This will help you identify triggers you can avoid or even eliminate. 11. Talk to someone. Let a trusted friend, family  member or even trained professional know that you are feeling overwhelmed and anxious. Verbalize what you are feeling and why.  12. Volunteer. If your anxiety is triggered by a crisis on a large scale, become an advocate and work to resolve the problem that is causing you unease. Anxiety is often unwelcome and can become overwhelming. If not kept in check, it can become a disorder that could require medical treatment. However, if you take the time to care for yourself and avoid the triggers that make you anxious, you will be able to find moments of relaxation and clarity that make your life much more enjoyable.

## 2015-12-17 NOTE — Assessment & Plan Note (Signed)
Check TSH and free T4 

## 2015-12-17 NOTE — Assessment & Plan Note (Signed)
Encouraged weight loss; see AVS 

## 2015-12-17 NOTE — Telephone Encounter (Signed)
Meds were sent to wrong pharmacy please send to [facility] [place]

## 2015-12-17 NOTE — Assessment & Plan Note (Addendum)
May be adjustment d/o with accompanying anxiety; start sertraline, counseling; start cognitive behavioral therapy; I am not going to start PRN benzo; discussed relaxation response; f/u in 3 weeks

## 2015-12-17 NOTE — Assessment & Plan Note (Signed)
Check level and supplement if needed 

## 2015-12-17 NOTE — Assessment & Plan Note (Addendum)
Get EKG today; check TSH to make sure not hyperthyroid; EKG showed nonspecific T wave changes septal region, 1st degree AV block; refer to cardiologist for stress testing; while her symptoms are likely all panic-anxiety, she is 55 years old, obese, with high cholesterol and hypertension; if sx recur, call 911, explained women with heart disease don't have good track records getting to the hospital for heart attacks

## 2015-12-17 NOTE — Progress Notes (Signed)
BP 118/86   Pulse 96   Temp 97.6 F (36.4 C) (Oral)   Resp 14   Wt 211 lb 11.2 oz (96 kg)   SpO2 98%   BMI 33.16 kg/m    Subjective:    Patient ID: Desiree Walker, female    DOB: [birth year], 55 y.o.   MRN: [id number]  HPI: Desiree Walker is a 55 y.o. female  Chief Complaint  Patient presents with  . Anxiety    new job   She started a new job; she works in [employer], very stressful she says; she is starting to have panic attacks; they run in her family She is wondering if she has ADHD; is it the stress of the job and having trouble focusing She has always had some trouble focusing, but coupled with anxiety at work, she has this overwhelming anxiety, like elephant sitting on chest, but thinks it is anxiety; with those episodes, she thinks it is stress-related; when she has a panic attack, she feels like an elephant is on her chest; her breathing is not labored, just needs to calm down and breathe; has to do that frequently; no nausea or chest pain; sometimes gets nausea when stressed out; she knows she needs to lose weight Many people in her family struggle with anxiety: father, brother, sister Not sure if not dealing with things as well She has not talked with anyone at work like through an employee assistance program She has not seen a counselor yet Cleaning helps her relax, like a stress reliever; feels a sense of accomplishment when things are clean She says that someone told her that she might ask her doctor for something to take PRN No problems with sleeping Not like depression; not crying; can get out of bed, not like that She has hypothyroidism; she has lost weight  Depression screen [facility] 2/9 12/17/2015 08/05/2015 12/25/2014 10/31/2014  Decreased Interest 0 0 0 0  Down, Depressed, Hopeless 1 2 0 0  PHQ - 2 Score 1 2 0 0  Altered sleeping - 0 - -  Tired, decreased energy - 1 - -  Change in appetite - 0 - -  Feeling bad or failure about yourself  - 2 - -  Trouble  concentrating - 2 - -  Moving slowly or fidgety/restless - 0 - -  Suicidal thoughts - 0 - -  PHQ-9 Score - 7 - -  Difficult doing work/chores - Somewhat difficult - -   Relevant past medical, surgical, family and social history reviewed Past Medical History:  Diagnosis Date  . Anemia    H/O  . Anxiety   . Complication of anesthesia   . GERD (gastroesophageal reflux disease)    NO MEDS  . Hyperlipidemia   . Hypertension    NO MEDS CURRENTLY-OFF MEDS SINCE 2015  . Hypothyroidism   . IFG (impaired fasting glucose) 08/05/2015  . Palpitations 2012   Evaluated by Ida Rogue, MD, Holter moniter  . PONV (postoperative nausea and vomiting)   . Rectal mass   . Thyroid disease    unspecified hypothyroidism   Past Surgical History:  Procedure Laterality Date  . ANAL FISTULOTOMY N/A 11/30/2014   Procedure: ANAL FISTULOTOMY;  Surgeon: Dia Crawford III, MD;  Location: [facility] ORS;  Service: General;  Laterality: N/A;  . CESAREAN SECTION    . DILATION AND CURETTAGE OF UTERUS    . left foot surgery    . RECTAL EXAM UNDER ANESTHESIA N/A 11/30/2014  Procedure: RECTAL EXAM UNDER ANESTHESIA;  Surgeon: Dia Crawford III, MD;  Location: [facility] ORS;  Service: General;  Laterality: N/A;   Family History  Problem Relation Age of Onset  . Hypertension Mother   . Heart disease Mother 28    "blockages"  . Cancer Sister 39    uterine  . Cancer Maternal Aunt 22    breast  . Hypothyroidism Daughter    Social History  Substance Use Topics  . Smoking status: Never Smoker  . Smokeless tobacco: Never Used  . Alcohol use No   Interim medical history since last visit reviewed. Allergies and medications reviewed  Review of Systems Per HPI unless specifically indicated above     Objective:    BP 118/86   Pulse 96   Temp 97.6 F (36.4 C) (Oral)   Resp 14   Wt 211 lb 11.2 oz (96 kg)   SpO2 98%   BMI 33.16 kg/m   Wt Readings from Last 3 Encounters:  12/17/15 211 lb 11.2 oz (96 kg)  10/15/15 217  lb (98.4 kg)  08/05/15 217 lb (98.4 kg)    Physical Exam  Constitutional: She appears well-developed and well-nourished. No distress.  Weight loss of 5+ pounds over last 5 months  HENT:  Head: Normocephalic and atraumatic.  Eyes: EOM are normal. No scleral icterus.  Neck: No thyromegaly present.  Cardiovascular: Normal rate, regular rhythm and normal heart sounds.   No murmur heard. Pulmonary/Chest: Effort normal and breath sounds normal. No respiratory distress. She has no wheezes.  Abdominal: Soft.  Musculoskeletal: Normal range of motion. She exhibits no edema.  Neurological: She is alert. She exhibits normal muscle tone.  Skin: Skin is warm and dry. She is not diaphoretic. No pallor.  Psychiatric: She has a normal mood and affect. Her behavior is normal. Judgment and thought content normal.   Results for orders placed or performed in visit on 08/19/15  POCT Urinalysis Dipstick (CPT 81002)  Result Value Ref Range   Color, UA yellow    Clarity, UA clear    Glucose, UA neg    Bilirubin, UA neg    Ketones, UA neg    Spec Grav, UA >=1.030    Blood, UA neg    pH, UA 6.0    Protein, UA neg    Urobilinogen, UA 0.2    Nitrite, UA neg    Leukocytes, UA Negative Negative      Assessment & Plan:   Problem List Items Addressed This Visit      Cardiovascular and Mediastinum   Hypertension goal BP (blood pressure) < 140/90    Controlled today      Relevant Medications   aspirin EC 81 MG tablet     Endocrine   Hypothyroidism, adult    Check TSH and free T4      Relevant Orders   TSH   T4, free     Other   Vitamin D deficiency    Check level and supplement if needed      Relevant Orders   VITAMIN D 25 Hydroxy (Vit-D Deficiency, Fractures)   Pressure in chest    Get EKG today; check TSH to make sure not hyperthyroid; EKG showed nonspecific T wave changes septal region, 1st degree AV block; refer to cardiologist for stress testing; while her symptoms are likely all  panic-anxiety, she is 55 years old, obese, with high cholesterol and hypertension; if sx recur, call 911, explained women with heart disease don't have good  track records getting to the hospital for heart attacks      Relevant Orders   EKG 12-Lead   Ambulatory referral to Cardiology   CBC with Differential/Platelet   BASIC METABOLIC PANEL WITH GFR   Magnesium   Obesity    Encouraged weight loss; see AVS      Anxiety disorder - Primary    May be adjustment d/o with accompanying anxiety; start sertraline, counseling; start cognitive behavioral therapy; I am not going to start PRN benzo; discussed relaxation response; f/u in 3 weeks       Other Visit Diagnoses   None.     Follow up plan: Return in about 3 weeks (around 01/07/2016).  An after-visit summary was printed and given to the patient at [facility].  Please see the patient instructions which may contain other information and recommendations beyond what is mentioned above in the assessment and plan.  Meds ordered this encounter  Medications  . aspirin EC 81 MG tablet    Sig: Take 1 tablet (81 mg total) by mouth daily.  . sertraline (ZOLOFT) 50 MG tablet    Sig: One-half of a pill by mouth daily x 6 days, then one whole pill daily    Dispense:  30 tablet    Refill:  0    Orders Placed This Encounter  Procedures  . CBC with Differential/Platelet  . TSH  . T4, free  . BASIC METABOLIC PANEL WITH GFR  . Magnesium  . VITAMIN D 25 Hydroxy (Vit-D Deficiency, Fractures)  . Ambulatory referral to Cardiology  . EKG 12-Lead

## 2015-12-20 ENCOUNTER — Other Ambulatory Visit: Payer: Self-pay | Admitting: Family Medicine

## 2015-12-21 LAB — CBC WITH DIFFERENTIAL/PLATELET
BASOS ABS: 0 10*3/uL (ref 0.0–0.2)
Basos: 0 %
EOS (ABSOLUTE): 0.1 10*3/uL (ref 0.0–0.4)
Eos: 2 %
HEMOGLOBIN: 13.3 g/dL (ref 11.1–15.9)
Hematocrit: 38.8 % (ref 34.0–46.6)
IMMATURE GRANS (ABS): 0 10*3/uL (ref 0.0–0.1)
Immature Granulocytes: 0 %
LYMPHS: 33 %
Lymphocytes Absolute: 2 10*3/uL (ref 0.7–3.1)
MCH: 27.1 pg (ref 26.6–33.0)
MCHC: 34.3 g/dL (ref 31.5–35.7)
MCV: 79 fL (ref 79–97)
MONOCYTES: 7 %
Monocytes Absolute: 0.4 10*3/uL (ref 0.1–0.9)
Neutrophils Absolute: 3.4 10*3/uL (ref 1.4–7.0)
Neutrophils: 58 %
Platelets: 230 10*3/uL (ref 150–379)
RBC: 4.9 x10E6/uL (ref 3.77–5.28)
RDW: 13.1 % (ref 12.3–15.4)
WBC: 6 10*3/uL (ref 3.4–10.8)

## 2015-12-21 LAB — BASIC METABOLIC PANEL
BUN/Creatinine Ratio: 18 (ref 9–23)
BUN: 15 mg/dL (ref 6–24)
CHLORIDE: 102 mmol/L (ref 96–106)
CO2: 24 mmol/L (ref 18–29)
CREATININE: 0.84 mg/dL (ref 0.57–1.00)
Calcium: 9.6 mg/dL (ref 8.7–10.2)
GFR calc Af Amer: 90 mL/min/{1.73_m2} (ref >59)
GFR calc non Af Amer: 78 mL/min/{1.73_m2} (ref >59)
GLUCOSE: 108 mg/dL — AB (ref 65–99)
Potassium: 4.5 mmol/L (ref 3.5–5.2)
Sodium: 140 mmol/L (ref 134–144)

## 2015-12-21 LAB — MAGNESIUM: Magnesium: 2.2 mg/dL (ref 1.6–2.3)

## 2015-12-21 LAB — VITAMIN D 25 HYDROXY (VIT D DEFICIENCY, FRACTURES): VIT D 25 HYDROXY: 16.7 ng/mL — AB (ref 30.0–100.0)

## 2015-12-21 LAB — T4, FREE: Free T4: 1.61 ng/dL (ref 0.82–1.77)

## 2015-12-21 LAB — TSH: TSH: 1.27 u[IU]/mL (ref 0.450–4.500)

## 2015-12-24 ENCOUNTER — Telehealth: Payer: Self-pay

## 2015-12-24 ENCOUNTER — Other Ambulatory Visit: Payer: Self-pay | Admitting: Family Medicine

## 2015-12-24 MED ORDER — VITAMIN D (ERGOCALCIFEROL) 1.25 MG (50000 UNIT) PO CAPS: 50000.0000 [IU] | ORAL_CAPSULE | ORAL | 2 refills | Status: DC

## 2015-12-24 NOTE — Telephone Encounter (Signed)
Patient called to get her Thyroid results and to check the status of her Cardiology referral.  After she verified her date of birth, labs were reviewed and she was informed that a prescription was went in to her preferred pharmacy.  Patient also stated that her information was blurted out in the waiting room when she came in the other day and she felt very embarrassed about it and was concerned about privacy issues.  I contacted Dr. Donivan Scull office and patient was scheduled to see Dr. Harrell Gave End tomorrow (12/25/15) at 9:30am since Dr. Rockey Situ is booked out until November.  I then called this patient back to see if this was ok and she stated she needed a later appt, so I gave her their office number [phone]) so that she could reschedule.

## 2015-12-24 NOTE — Progress Notes (Signed)
rx for vit D twice a month for 3 months, then OTC

## 2015-12-25 ENCOUNTER — Ambulatory Visit (INDEPENDENT_AMBULATORY_CARE_PROVIDER_SITE_OTHER): Payer: Managed Care, Other (non HMO) | Admitting: Internal Medicine

## 2015-12-25 ENCOUNTER — Encounter: Payer: Self-pay | Admitting: Internal Medicine

## 2015-12-25 VITALS — BP 128/80 | HR 82 | Ht 67.0 in | Wt 211.2 lb

## 2015-12-25 DIAGNOSIS — R079 Chest pain, unspecified: Secondary | ICD-10-CM | POA: Diagnosis not present

## 2015-12-25 DIAGNOSIS — I10 Essential (primary) hypertension: Secondary | ICD-10-CM | POA: Diagnosis not present

## 2015-12-25 NOTE — Progress Notes (Signed)
New Outpatient Visit Date: 12/25/2015  Referring Provider: Arnetha Courser, MD [address] [place], [place] [address]  Chief Complaint: chest pain  HPI:  Desiree Walker is a 55 y.o. year-old female with history of hypertension, hyperlipidemia, GERD, who has been referred by Dr. Sanda Klein for evaluation of chest pain. She was seen in our office in 08/2010 by Dr. Rockey Situ for evaluation of palpitations. She was started on beta blocker with improvement in her symptoms but no longer takes this medication. 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 She notices substernal chest pressure without radiation that is accompanied by nausea and occasional palpitations. Episodes typically lasts less than 5 minutes and are related to stressful situations, particularly when the patient thinks of her job. She does not have exertional symptoms. The last episode occurred about 1.5 weeks ago. Patient began taking low-dose aspirin after seeing her PCP one week ago. She was also started on sertraline at that time for possible anxiety. However, she noticed significant nausea and worsening anxiety after taking the first dose and has not continued the medication.  The patient also has intermittent palpitations. She typically notices this when lying in bed at night and describes a flutter or "abnormal beat." The sensation lasts for less than a minute and is not accompanied by pain or shortness of breath. She denies lightheadedness or syncope.  Finally, the patient is concerned about intermittent pain in the left leg and "atrophy" of the calf muscles following foot surgery [facility]. Pain does not seem to be related to activity. She denies wounds on her  foot.  --------------------------------------------------------------------------------------------------  Cardiovascular History & Procedures: Cardiovascular Problems:  Chest pain  Palpitations  Risk Factors:  Hypertension, hyperlipidemia  Cath/PCI:  None.  CV Surgery:  None.  EP Procedures and Devices:  None.  Non-Invasive Evaluation(s):  None.  Recent CV Pertinent Labs: Lab Results  Component Value Date   CHOL 222 (H) 08/06/2015   CHOL 234 (H) 09/14/2013   HDL 53 08/06/2015   HDL 51 09/14/2013   LDLCALC 139 (H) 08/06/2015   LDLCALC 153 (H) 09/14/2013   TRIG 149 08/06/2015   TRIG 149 09/14/2013   K 4.5 12/20/2015   K 4.1 10/10/2013   MG 2.2 12/20/2015   BUN 15 12/20/2015   BUN 12 10/10/2013   CREATININE 0.84 12/20/2015   CREATININE 0.82 10/10/2013   --------------------------------------------------------------------------------------------------  Past Medical History:  Diagnosis Date  . Anemia    H/O  . Anxiety   . Complication of anesthesia   . GERD (gastroesophageal reflux disease)    NO MEDS  . Hyperlipidemia   . Hypertension    NO MEDS CURRENTLY-OFF MEDS SINCE 2015  . Hypothyroidism   . IFG (impaired fasting glucose) 08/05/2015  . Palpitations 2012   Evaluated by Ida Rogue, MD, Holter moniter  . PONV (postoperative nausea and vomiting)   . Rectal mass   . Thyroid disease    unspecified hypothyroidism    Past Surgical History:  Procedure Laterality Date  . ANAL FISTULOTOMY N/A 11/30/2014   Procedure: ANAL FISTULOTOMY;  Surgeon: Dia Crawford III, MD;  Location: [facility] ORS;  Service: General;  Laterality: N/A;  . CESAREAN SECTION    . DILATION AND CURETTAGE OF UTERUS    . left foot surgery    . RECTAL EXAM UNDER  ANESTHESIA N/A 11/30/2014   Procedure: RECTAL EXAM UNDER ANESTHESIA;  Surgeon: Dia Crawford III, MD;  Location: [facility] ORS;  Service: General;  Laterality: N/A;    Outpatient Encounter Prescriptions as of 12/25/2015  Medication  Sig  . aspirin EC 81 MG tablet Take 1 tablet (81 mg total) by mouth daily.  Marland Kitchen levothyroxine (SYNTHROID, LEVOTHROID) 112 MCG tablet Take 1 tablet (112 mcg total) by mouth daily before breakfast.  . losartan (COZAAR) 50 MG tablet Take 1.5 tablets (75 mg total) by mouth daily.  . sertraline (ZOLOFT) 50 MG tablet One-half of a pill by mouth daily x 6 days, then one whole pill daily  . [DISCONTINUED] Vitamin D, Ergocalciferol, (DRISDOL) 50000 units CAPS capsule Take 1 capsule (50,000 Units total) by mouth every 14 (fourteen) days. (Patient not taking: Reported on 12/25/2015)   No facility-administered encounter medications on file as of 12/25/2015.     Allergies: Scopolamine  Social History   Social History  . Marital status: Married    Spouse name: N/A  . Number of children: N/A  . Years of education: N/A   Occupational History  . Not on file.   Social History Main Topics  . Smoking status: Never Smoker  . Smokeless tobacco: Never Used  . Alcohol use No  . Drug use: No  . Sexual activity: Not on file   Other Topics Concern  . Not on file   Social History Narrative  . No narrative on file    Family History  Problem Relation Age of Onset  . Hypertension Mother   . Heart disease Mother 38    "blockages"  . Cancer Sister 35    uterine  . Cancer Maternal Aunt 7    breast  . Hypothyroidism Daughter     Review of Systems: A 12-system review of systems was performed and was negative except as noted in the HPI.  --------------------------------------------------------------------------------------------------  Physical Exam: BP 128/80 (BP Location: Right Arm, Patient Position: Sitting, Cuff Size: Normal)   Pulse 82   Ht 5\' 7"  (1.702 m)   Wt 211 lb 4 oz (95.8 kg)   BMI 33.09 kg/m   General:  Obese woman, seated comfortably on the exam table. HEENT: No conjunctival pallor or scleral icterus.  Moist mucous membranes.  OP clear. Neck: Supple without lymphadenopathy,  thyromegaly, JVD, or HJR.  No carotid bruit. Lungs: Normal work of breathing.  Clear to auscultation bilaterally without wheezes or crackles. Heart: Regular rate and rhythm without murmurs, rubs, or gallops.  Non-displaced PMI. Abd: Bowel sounds present.  Soft, NT/ND without hepatosplenomegaly Ext: No lower extremity edema.  Radial and dorsalis pedis pulses are 2+ bilaterally. The right posterior tibial pulse is 2+. The left posterior tibial pulse is difficult to palpate due to deformity of the foot following surgery. There is slight asymmetry of the calves, smaller on the left. Skin: warm and dry without rash Neuro: CNIII-XII intact.  Strength and fine-touch sensation intact in upper and lower extremities bilaterally. Psych: Normal mood and affect.  EKG (12/17/15):  Sinus rhythm with subtle T-wave changes in V1 and V2 that are nonspecific.  Lab Results  Component Value Date   WBC 6.0 12/20/2015   HGB 13.3 09/14/2013   HCT 38.8 12/20/2015   MCV 79 12/20/2015   PLT 230 12/20/2015    Lab Results  Component Value Date   NA 140 12/20/2015   K 4.5 12/20/2015   CL 102 12/20/2015   CO2 24 12/20/2015   BUN 15 12/20/2015  CREATININE 0.84 12/20/2015   GLUCOSE 108 (H) 12/20/2015   ALT 20 09/14/2013    Lab Results  Component Value Date   CHOL 222 (H) 08/06/2015   HDL 53 08/06/2015   LDLCALC 139 (H) 08/06/2015   TRIG 149 08/06/2015   --------------------------------------------------------------------------------------------------  ASSESSMENT AND PLAN: 1. Chest pain Patient reports approximately 2 months of atypical chest pain that began around the time that she started her new job. She equates the discomfort with stress. She has not had any exertional symptoms. Her cardiac risk factors include hypertension, hyperlipidemia, and obesity. EKG performed last week by her PCP demonstrates nonspecific T-wave changes. We have agreed to proceed with an exercise myocardial perfusion stress test  to exclude obstructive CAD. We spoke about risk factor modification including control of hypertension. Patient's LDL is elevated and will hopefully improve with lifestyle modification. It is reasonable to continue with low-dose aspirin. If the stress test is normal, I will recommend that the patient follow-up with her PCP regarding further treatment of possible anxiety contributing to her symptoms.  2. Hypertension Blood pressure is reasonably well controlled today. We will not make any changes to her low current regimen of losartan 75 mg daily.  Follow-up: Return to clinic in 3 months.  Nelva Bush, MD 12/25/2015 9:45 AM

## 2015-12-25 NOTE — Patient Instructions (Addendum)
Medication Instructions:  Your physician recommends that you continue on your current medications as directed. Please refer to the Current Medication list given to you today.   Labwork: none  Testing/Procedures: Your physician has requested that you have a lexiscan myoview. For further information please visit [email or web address]. Please follow instruction sheet, as given.  [place]  Your caregiver has ordered a Stress Test with nuclear imaging. The purpose of this test is to evaluate the blood supply to your heart muscle. This procedure is referred to as a "Non-Invasive Stress Test." This is because other than having an IV started in your vein, nothing is inserted or "invades" your body. Cardiac stress tests are done to find areas of poor blood flow to the heart by determining the extent of coronary artery disease (CAD). Some patients exercise on a treadmill, which naturally increases the blood flow to your heart, while others who are  unable to walk on a treadmill due to physical limitations have a pharmacologic/chemical stress agent called Lexiscan . This medicine will mimic walking on a treadmill by temporarily increasing your coronary blood flow.   Please note: these test may take anywhere between 2-4 hours to complete  PLEASE REPORT TO [place] AT THE FIRST DESK WILL DIRECT YOU WHERE TO GO  Date of Procedure:__Thursday, Nov 16__  Arrival Time for Procedure:_7:15am_____  Instructions regarding medication:   You may take your morning medications with a sip of water.   PLEASE NOTIFY THE OFFICE AT LEAST 55 HOURS IN ADVANCE IF YOU ARE UNABLE TO KEEP YOUR APPOINTMENT.  [phone] AND  PLEASE NOTIFY NUCLEAR MEDICINE AT [facility] AT LEAST 24 HOURS IN ADVANCE IF YOU ARE UNABLE TO KEEP YOUR APPOINTMENT. [phone]  How to prepare for your Myoview test:  1. Do not eat or drink after midnight 2. No caffeine for 24 hours prior to test 3. No smoking 24  hours prior to test. 4. Your medication may be taken with water.  If your doctor stopped a medication because of this test, do not take that medication. 5. Ladies, please do not wear dresses.  Skirts or pants are appropriate. Please wear a short sleeve shirt. 6. No perfume, cologne or lotion. 7. Wear comfortable walking shoes. No heels!            Follow-Up: Your physician recommends that you schedule a follow-up appointment in: 6 weeks with Dr. Saunders Revel.   Any Other Special Instructions Will Be Listed Below (If Applicable).     If you need a refill on your cardiac medications before your next appointment, please call your pharmacy.  Cardiac Nuclear Scanning A cardiac nuclear scan is used to check your heart for problems, such as the following:  A portion of the heart is not getting enough blood.  Part of the heart muscle has died, which happens with a heart attack.  The heart wall is not working normally.  In this test, a radioactive dye (tracer) is injected into your bloodstream. After the tracer has traveled to your heart, a scanning device is used to measure how much of the tracer is absorbed by or distributed to various areas of your heart. LET [facility] CARE PROVIDER KNOW ABOUT:  Any allergies you have.  All medicines you are taking, including vitamins, herbs, eye drops, creams, and over-the-counter medicines.  Previous problems you or members of your family have had with the use of anesthetics.  Any blood disorders you have.  Previous surgeries you have had.  Medical conditions you have.  RISKS AND COMPLICATIONS Generally, this is a safe procedure. However, as with any procedure, problems can occur. Possible problems include:   Serious chest pain.  Rapid heartbeat.  Sensation of warmth in your chest. This usually passes quickly. BEFORE THE PROCEDURE Ask your health care provider about changing or stopping your regular medicines. PROCEDURE This procedure  is usually done at a hospital and takes 2-4 hours.  An IV tube is inserted into one of your veins.  Your health care provider will inject a small amount of radioactive tracer through the tube.  You will then wait for 20-40 minutes while the tracer travels through your bloodstream.  You will lie down on an exam table so images of your heart can be taken. Images will be taken for about 15-20 minutes.  You will exercise on a treadmill or stationary bike. While you exercise, your heart activity will be monitored with an electrocardiogram (ECG), and your blood pressure will be checked.  If you are unable to exercise, you may be given a medicine to make your heart beat faster.  When blood flow to your heart has peaked, tracer will again be injected through the IV tube.  After 20-40 minutes, you will get back on the exam table and have more images taken of your heart.  When the procedure is over, your IV tube will be removed. AFTER THE PROCEDURE  You will likely be able to leave shortly after the test. Unless your health care provider tells you otherwise, you may return to your normal schedule, including diet, activities, and medicines.  Make sure you find out how and when you will get your test results.   This information is not intended to replace advice given to you by your health care provider. Make sure you discuss any questions you have with your health care provider.   Document Released: 03/27/2004 Document Revised: 03/07/2013 Document Reviewed: 02/08/2013 Elsevier Interactive Patient Education [employer].

## 2016-01-08 ENCOUNTER — Ambulatory Visit: Payer: Managed Care, Other (non HMO) | Admitting: Family Medicine

## 2016-01-21 ENCOUNTER — Other Ambulatory Visit: Payer: Self-pay | Admitting: Family Medicine

## 2016-01-21 MED ORDER — LEVOTHYROXINE SODIUM 112 MCG PO TABS: 112.0000 ug | ORAL_TABLET | Freq: Every day | ORAL | 1 refills | Status: DC

## 2016-01-21 NOTE — Telephone Encounter (Signed)
Please see my prescribing history; I approved a 6 month supply on 10/15/15; she should have enough until Feb; please resolve with pharmacy; thank you

## 2016-01-21 NOTE — Telephone Encounter (Signed)
Thank you. Rx sent.

## 2016-01-21 NOTE — Telephone Encounter (Signed)
Patient is requesting a refill on levothroixine.  Pt stated that she had blood work done on Oct. Patient uses [facility] in [place].  Pt also stated that if the dosage was going to be changed to please call and let her know.

## 2016-01-21 NOTE — Telephone Encounter (Signed)
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

## 2016-01-30 ENCOUNTER — Other Ambulatory Visit: Payer: Self-pay

## 2016-02-07 ENCOUNTER — Encounter: Payer: Self-pay | Admitting: *Deleted

## 2016-02-07 ENCOUNTER — Ambulatory Visit
Admission: EM | Admit: 2016-02-07 | Discharge: 2016-02-07 | Disposition: A | Discharge status: Home / Self Care | Payer: Managed Care, Other (non HMO) | Attending: Family Medicine | Admitting: Family Medicine

## 2016-02-07 DIAGNOSIS — L03032 Cellulitis of left toe: Secondary | ICD-10-CM

## 2016-02-07 MED ORDER — CEPHALEXIN 500 MG PO CAPS: 500.0000 mg | ORAL_CAPSULE | Freq: Three times a day (TID) | ORAL | 0 refills | Status: AC

## 2016-02-07 NOTE — ED Provider Notes (Signed)
OCSN: [id number]     Arrival date & time 02/07/16  0818 History   First MD Initiated Contact with Patient 02/07/16 [phone]     Chief Complaint  Patient presents with  . Toe Pain   (Consider location/radiation/quality/duration/timing/severity/associated sxs/prior Treatment) Patient is a well-appearing 55 y.o female, presents today with concern for a toe infection for 2 day's duration. Patient reports that the skin surrounding the left great toe is red and painful. She denies any pus draining from the area. She also denies fever, limited ROM, difficulty ambulating. She also denies CP, SOB, Abd Pain, N/D.        Past Medical History:  Diagnosis Date  . Anemia    H/O  . Anxiety   . Complication of anesthesia   . GERD (gastroesophageal reflux disease)    NO MEDS  . Hyperlipidemia   . Hypertension    NO MEDS CURRENTLY-OFF MEDS SINCE 2015  . Hypothyroidism    Hashimoto's per patient  . IFG (impaired fasting glucose) 08/05/2015  . Palpitations 2012   Evaluated by Ida Rogue, MD, Holter moniter  . PONV (postoperative nausea and vomiting)   . Rectal mass   . Thyroid disease    unspecified hypothyroidism   Past Surgical History:  Procedure Laterality Date  . ANAL FISTULOTOMY N/A 11/30/2014   Procedure: ANAL FISTULOTOMY;  Surgeon: Dia Crawford III, MD;  Location: [facility] ORS;  Service: General;  Laterality: N/A;  . CESAREAN SECTION    . DILATION AND CURETTAGE OF UTERUS    . left foot surgery    . RECTAL EXAM UNDER ANESTHESIA N/A 11/30/2014   Procedure: RECTAL EXAM UNDER ANESTHESIA;  Surgeon: Dia Crawford III, MD;  Location: [facility] ORS;  Service: General;  Laterality: N/A;   Family History  Problem Relation Age of Onset  . Hypertension Mother   . Heart disease Mother 51    "blockages"  . Diverticulitis Father   . Hernia Father   . Depression Father   . Cancer Sister 45    uterine  . Cancer Maternal Aunt 14    breast  . Hypothyroidism Daughter    Social History  Substance Use  Topics  . Smoking status: Never Smoker  . Smokeless tobacco: Never Used  . Alcohol use No   OB History    Gravida Para Term Preterm AB Living   4 3     1 3    SAB TAB Ectopic Multiple Live Births   1              Obstetric Comments   1st Menstrual Cycle:  11 1st Pregnancy:  20     Review of Systems  Constitutional:       As stated in the HPI    Allergies  Scopolamine  Home Medications   Prior to Admission medications   Medication Sig Start Date End Date Taking? Authorizing Provider  aspirin EC 81 MG tablet Take 1 tablet (81 mg total) by mouth daily. 12/17/15  Yes Arnetha Courser, MD  levothyroxine (SYNTHROID, LEVOTHROID) 112 MCG tablet Take 1 tablet (112 mcg total) by mouth daily before breakfast. 01/21/16  Yes Arnetha Courser, MD  losartan (COZAAR) 50 MG tablet Take 1.5 tablets (75 mg total) by mouth daily. 10/15/15  Yes Arnetha Courser, MD  cephALEXin (KEFLEX) 500 MG capsule Take 1 capsule (500 mg total) by mouth 3 (three) times daily. 02/07/16 02/12/16  Barry Dienes, NP  sertraline (ZOLOFT) 50 MG tablet One-half of a pill by mouth daily  x 6 days, then one whole pill daily Patient not taking: Reported on 12/25/2015 12/17/15   Arnetha Courser, MD   Meds Ordered and Administered this Visit  Medications - No data to display  BP (!) 145/80 (BP Location: Left Arm)   Pulse 89   Temp 97.4 F (36.3 C) (Oral)   Resp 16   Ht 5\' 7"  (1.702 m)   Wt 215 lb (97.5 kg)   SpO2 99%   BMI 33.67 kg/m  No data found.   Physical Exam  Constitutional: She appears well-developed and well-nourished.  Cardiovascular: Normal rate.   Pulmonary/Chest: Effort normal.  Skin: Skin is warm and dry.  See picture below. Skin/tissue surrounding the Lateral proximal area of the toe is red, swollen and tender to touch  Nursing note and vitals reviewed.     Urgent Care Course   Clinical Course     Procedures (including critical care time)  Labs Review Labs Reviewed - No data to display  Imaging  Review No results found.  MDM   1. Paronychia of great toe, left    1) Soak the foot in warm soapy water multiple times per day, 15-20 minutes each time 2) Take keflex 500 mg TID 3) Follow up with Dr. Vickki Muff to have the toenail remove 4) All questions answered. Discharge paperwork given.    Barry Dienes, NP 02/07/16 [phone]

## 2016-02-07 NOTE — ED Triage Notes (Signed)
Left big toe red and painful x1 week. Denies injury.

## 2016-02-07 NOTE — Discharge Instructions (Signed)
Take antibiotic as directed.  Soak the foot in warm soapy water several times a day for 15 minutes each time. Follow up with Dr. Vickki Muff; please call to schedule an appointment.

## 2016-02-11 ENCOUNTER — Ambulatory Visit: Payer: Self-pay | Admitting: Internal Medicine

## 2016-02-26 ENCOUNTER — Ambulatory Visit: Payer: Managed Care, Other (non HMO) | Admitting: Podiatry

## 2016-02-27 ENCOUNTER — Ambulatory Visit (INDEPENDENT_AMBULATORY_CARE_PROVIDER_SITE_OTHER): Payer: Managed Care, Other (non HMO) | Admitting: Podiatry

## 2016-02-27 ENCOUNTER — Encounter: Payer: Self-pay | Admitting: Podiatry

## 2016-02-27 DIAGNOSIS — L608 Other nail disorders: Secondary | ICD-10-CM

## 2016-02-27 DIAGNOSIS — B351 Tinea unguium: Secondary | ICD-10-CM

## 2016-02-27 DIAGNOSIS — M659 Synovitis and tenosynovitis, unspecified: Secondary | ICD-10-CM

## 2016-02-27 DIAGNOSIS — L603 Nail dystrophy: Secondary | ICD-10-CM | POA: Diagnosis not present

## 2016-02-27 DIAGNOSIS — G5792 Unspecified mononeuropathy of left lower limb: Secondary | ICD-10-CM

## 2016-02-27 DIAGNOSIS — M79609 Pain in unspecified limb: Secondary | ICD-10-CM

## 2016-02-27 DIAGNOSIS — M7752 Other enthesopathy of left foot: Secondary | ICD-10-CM

## 2016-02-27 DIAGNOSIS — M25572 Pain in left ankle and joints of left foot: Secondary | ICD-10-CM

## 2016-02-27 DIAGNOSIS — T85848D Pain due to other internal prosthetic devices, implants and grafts, subsequent encounter: Secondary | ICD-10-CM

## 2016-03-01 NOTE — Progress Notes (Signed)
Subjective: Patient presents today for possible treatment and evaluation of fungal nails to the left great toe. Patient states that the nails have been discolored and thickened for greater than 1 month. Patient presents today for further treatment and evaluation. Patient also has a complaint of a left foot reconstructive surgery that was performed several years ago. Patient states that she continues to have residual pain and tenderness to her left lower extremity. Patient presents today for further treatment and evaluation   Objective: Physical Exam General: The patient is alert and oriented x3 in no acute distress.  Dermatology: Hyperkeratotic, discolored, thickened, onychodystrophy of nails noted bilaterally.  Skin is warm, dry and supple bilateral lower extremities. Negative for open lesions or macerations.  Vascular: Palpable pedal pulses bilaterally. No edema or erythema noted. Capillary refill within normal limits.  Neurological: Epicritic and protective threshold grossly intact bilaterally.   Musculoskeletal Exam: Pain on palpation to the anterior medial and lateral aspects of the patient's left ankle joint. Negative range of motion to the subtalar joint left lower extremity secondary to surgical subtalar joint arthrodesis and Lapidus. There is palpable prominent screw heads noted to the medial cuneiform of the left lower extremity as well. Percussion of the screw heads elicits a positive Tinel sign consistent with a neuritis and nerve irritation to the left lower extremity  Assessment: #1 onychodystrophy bilateral toenails #2 possible onychomycosis #3 hyperkeratotic nails bilateral #4 ankle joint synovitis with capsulitis left lower extremity #5 pain in left ankle #6 prominent orthopedic screws - Orthohelix 4.0 screws to metatarsal cuneiform joint left foot as per operative note dated 10/20/2013 #7 painful hardware left lower extremity - 7.14mm Orthohelix subtalar screws x 2. 4.57mm  orthohelix lapidus screws x 2.  #8 neuritis left foot  Plan of Care:  #1 Patient was evaluated. #2 Orders for liver function tests were ordered today.  #3 Today nail biopsy was taken and sent to pathology for fungal culture. #5 patient is to return to clinic in 4 weeks to discuss fungal culture nail biopsy findings and LFT results and discuss different treatment options. #6 today we discussed in detail the management options regarding her left lower extremity and iatrogenic pain. Conservative options include shoe gear modifications, good supportive insoles, anti-inflammatory injections. Surgical options include removal of hardware, ankle arthroscopic synovectomy. At the moment the patient opts for conservative management. #7 return to clinic in 4 weeks to discuss nail biopsy results and management of fungal nails to the left lower extremity.  Left ankle x-rays next visit   Edrick Kins, DPM [facility]  Dr. Edrick Kins, [address]                                        [place], [place] [address]                Office [phone]  Fax [phone]

## 2016-03-13 ENCOUNTER — Telehealth: Payer: Self-pay | Admitting: *Deleted

## 2016-03-13 NOTE — Telephone Encounter (Signed)
Pt states she called the [place] office to see what the price for the foot cream was and no one called her back, and now she has received a foot cream she may not be able to afford. I told pt I did not get a message from [place] office and that was unusual and she should have received a call from the pharmacy prior to getting the cream to discuss insurance coverage and delivery specifications. Pt states the [place] did not call. I gave pt [facility] [phone] and told her to contact them for a return slip. Pt states understanding.

## 2016-03-17 ENCOUNTER — Ambulatory Visit (INDEPENDENT_AMBULATORY_CARE_PROVIDER_SITE_OTHER): Payer: Commercial Managed Care - PPO | Admitting: Podiatry

## 2016-03-17 ENCOUNTER — Ambulatory Visit (INDEPENDENT_AMBULATORY_CARE_PROVIDER_SITE_OTHER): Payer: Commercial Managed Care - PPO

## 2016-03-17 DIAGNOSIS — M659 Synovitis and tenosynovitis, unspecified: Secondary | ICD-10-CM | POA: Diagnosis not present

## 2016-03-17 DIAGNOSIS — L608 Other nail disorders: Secondary | ICD-10-CM

## 2016-03-17 DIAGNOSIS — M7752 Other enthesopathy of left foot: Secondary | ICD-10-CM | POA: Diagnosis not present

## 2016-03-17 DIAGNOSIS — T85848D Pain due to other internal prosthetic devices, implants and grafts, subsequent encounter: Secondary | ICD-10-CM

## 2016-03-17 DIAGNOSIS — M79609 Pain in unspecified limb: Secondary | ICD-10-CM

## 2016-03-17 DIAGNOSIS — G5792 Unspecified mononeuropathy of left lower limb: Secondary | ICD-10-CM

## 2016-03-17 DIAGNOSIS — M25572 Pain in left ankle and joints of left foot: Secondary | ICD-10-CM | POA: Diagnosis not present

## 2016-03-17 DIAGNOSIS — L603 Nail dystrophy: Secondary | ICD-10-CM

## 2016-03-17 DIAGNOSIS — Z79899 Other long term (current) drug therapy: Secondary | ICD-10-CM

## 2016-03-17 DIAGNOSIS — B351 Tinea unguium: Secondary | ICD-10-CM

## 2016-03-17 MED ORDER — TERBINAFINE HCL 250 MG PO TABS: 250.0000 mg | ORAL_TABLET | Freq: Every day | ORAL | 2 refills | Status: DC

## 2016-03-17 MED ORDER — NONFORMULARY OR COMPOUNDED ITEM: 1.0000 [drp] | Freq: Every day | 2 refills | Status: DC

## 2016-03-17 MED ORDER — BETAMETHASONE SOD PHOS & ACET 6 (3-3) MG/ML IJ SUSP: 3.0000 mg | Freq: Once | INTRAMUSCULAR | Status: DC

## 2016-03-17 NOTE — Progress Notes (Signed)
Subjective: Patient presents today for follow-up evaluation of fungal nails to the left great toe as well as left ankle pain. Patient had surgery several years ago at which time a subtalar joint arthrodesis as well as first metatarsal cuneiform arthrodesis was performed. The patient presents today to review fungal culture results and discuss treatment options for left lower extremity pain secondary to surgery. Patient also has a complaint of a left foot reconstructive surgery that was performed several years ago. Patient states that she continues to have residual pain and tenderness to her left lower extremity. Patient presents today for further treatment and evaluation   Objective: Physical Exam General: The patient is alert and oriented x3 in no acute distress.  Dermatology: Hyperkeratotic, discolored, thickened, onychodystrophy of nails noted bilaterally.  Skin is warm, dry and supple bilateral lower extremities. Negative for open lesions or macerations.  Vascular: Palpable pedal pulses bilaterally. No edema or erythema noted. Capillary refill within normal limits.  Neurological: Epicritic and protective threshold grossly intact bilaterally.   Musculoskeletal Exam: Pain on palpation to the anterior medial and lateral aspects of the patient's left ankle joint. Negative range of motion to the subtalar joint left lower extremity secondary to surgical subtalar joint arthrodesis and Lapidus. There is palpable prominent screw heads noted to the medial cuneiform of the left lower extremity as well. Percussion of the screw heads elicits a positive Tinel sign consistent with a neuritis and nerve irritation to the left lower extremity  Assessment: #1 onychomycosis bilateral toenails #2 ankle joint synovitis with capsulitis left lower extremity #5 pain in left ankle #6 prominent orthopedic screws - Orthohelix 4.0 screws to metatarsal cuneiform joint left foot as per operative note dated 10/20/2013 #7  painful hardware left lower extremity - 7.40mm Orthohelix subtalar screws x 2. 4.78mm orthohelix lapidus screws x 2.  #8 neuritis left foot  Plan of Care:  #1 Patient was evaluated. #2 today nail biopsy results were reviewed which were positive for fungus  #3 prescription for terbinafine 250 mg #90 #4 prescription for antifungal nail lacquer dispensed through [facility] #5 liver function tests were ordered today #6 injection of 0.5 mL Celestone Soluspan injected patient's left ankle joint #7 return to clinic in 4 weeks  Next appointment, authorization for surgery: Arthroscopic ankle synovectomy with removal of hardware left lower extremity.  Edrick Kins, DPM [facility]  Dr. Edrick Kins, [place] [address]                                        [place], [place] [address]                Office [phone]  Fax [phone]

## 2016-03-18 ENCOUNTER — Telehealth: Payer: Self-pay | Admitting: Podiatry

## 2016-03-18 NOTE — Telephone Encounter (Signed)
No charges were entered for the product yesterday as she called to say she could not wear and would be returning it today.

## 2016-03-18 NOTE — Telephone Encounter (Signed)
Patient was seen yesterday by Dr. Amalia Hailey in the office. She was dispensed a Large Trilok Ankle Brace- [id number].  Today the patient brought the brace back saying it was too tight on her and we didn't  Have an XL in the office. She just wanted a refund of the cost for the brace. She said that she had another brace at home and she would just use that instead.  When I went in to her account there wasn't a charge put in for the brace, so we have nothing to refund. Just wanted to let you both know that she brought it back, just in case you were going to put the charge in for it today or perhaps this charge was overlooked. Thank you!

## 2016-03-27 ENCOUNTER — Ambulatory Visit: Payer: Managed Care, Other (non HMO) | Admitting: Podiatry

## 2016-04-14 ENCOUNTER — Ambulatory Visit: Payer: Commercial Managed Care - PPO | Admitting: Podiatry

## 2016-04-28 ENCOUNTER — Ambulatory Visit (INDEPENDENT_AMBULATORY_CARE_PROVIDER_SITE_OTHER): Payer: Commercial Managed Care - PPO | Admitting: Podiatry

## 2016-04-28 DIAGNOSIS — M7752 Other enthesopathy of left foot: Secondary | ICD-10-CM

## 2016-04-28 DIAGNOSIS — M659 Synovitis and tenosynovitis, unspecified: Secondary | ICD-10-CM | POA: Diagnosis not present

## 2016-04-28 DIAGNOSIS — M65972 Unspecified synovitis and tenosynovitis, left ankle and foot: Secondary | ICD-10-CM

## 2016-04-28 DIAGNOSIS — B351 Tinea unguium: Secondary | ICD-10-CM

## 2016-04-28 DIAGNOSIS — T85848D Pain due to other internal prosthetic devices, implants and grafts, subsequent encounter: Secondary | ICD-10-CM

## 2016-04-28 DIAGNOSIS — G5792 Unspecified mononeuropathy of left lower limb: Secondary | ICD-10-CM

## 2016-04-28 DIAGNOSIS — G8929 Other chronic pain: Secondary | ICD-10-CM

## 2016-04-28 DIAGNOSIS — M79672 Pain in left foot: Secondary | ICD-10-CM | POA: Diagnosis not present

## 2016-04-28 MED ORDER — NONFORMULARY OR COMPOUNDED ITEM: 1.0000 [drp] | Freq: Every day | 2 refills | Status: DC

## 2016-04-28 NOTE — Progress Notes (Signed)
Subjective: Patient presents today for follow-up evaluation of ankle joint synovitis and capsulitis to the left lower extremity as well as painful orthopedic hardware. Patient also follows up for onychomycosis bilateral toenails. Patient states that her ankle pain is feeling much better now since the injection she received last visit.  Objective: Physical Exam General: The patient is alert and oriented x3 in no acute distress.  Dermatology: Hyperkeratotic, discolored, thickened, onychodystrophy of nails noted bilaterally.  Skin is warm, dry and supple bilateral lower extremities. Negative for open lesions or macerations.  Vascular: Palpable pedal pulses bilaterally. No edema or erythema noted. Capillary refill within normal limits.  Neurological: Epicritic and protective threshold grossly intact bilaterally.   Musculoskeletal Exam: Pain on palpation to the anterior medial and lateral aspects of the patient's left ankle joint. Negative range of motion to the subtalar joint left lower extremity secondary to surgical subtalar joint arthrodesis and Lapidus. There is palpable prominent screw heads noted to the medial cuneiform of the left lower extremity as well. Percussion of the screw heads elicits a positive Tinel sign consistent with a neuritis and nerve irritation to the left lower extremity  Assessment: #1 onychomycosis bilateral toenails #2 ankle joint synovitis with capsulitis left lower extremity #5 pain in left ankle #6 prominent orthopedic screws - Orthohelix 4.0 screws to metatarsal cuneiform joint left foot as per operative note dated 10/20/2013 #7 painful hardware left lower extremity - 7.3mm Orthohelix subtalar screws x 2. 4.29mm orthohelix lapidus screws x 2.  #8 neuritis left foot  Plan of Care:  #1 Patient was evaluated. #2 today we recommend conservative treatment for left ankle and foot pain. Patient's ankle pain has improved greatly since last visit. #3 today the patient  was scanned for custom molded orthotics to support the midfoot structures of her left foot. I did explain the patient should she would likely need orthotics long-term to support the subtalar joint arthrodesis and pedal structures of her left foot. #4 patient does not want to take oral antifungal medication. Continue antifungal nail lacquer through [employer]. Today wearing reorder the antifungal nail lacquer through [employer]. #5 return to clinic in 3 weeks to pick up orthotics  Edrick Kins, DPM [facility]  Dr. Edrick Kins, [place]                                        [place], [place] [address]                Office [phone]  Fax [phone]

## 2016-05-05 ENCOUNTER — Other Ambulatory Visit: Payer: Self-pay

## 2016-05-05 ENCOUNTER — Ambulatory Visit: Payer: Commercial Managed Care - PPO | Admitting: Podiatry

## 2016-05-05 ENCOUNTER — Telehealth: Payer: Self-pay | Admitting: Family Medicine

## 2016-05-05 DIAGNOSIS — I10 Essential (primary) hypertension: Secondary | ICD-10-CM

## 2016-05-05 NOTE — Telephone Encounter (Signed)
Requesting refill on losartan. Asking that you send to [facility]. Only have 4 days worth left. It is okay to leave detailed message on cell [phone]

## 2016-05-05 NOTE — Telephone Encounter (Signed)
Please clarify with patient exactly how she is taking the losartan Prescription history suggests noncompliance or she's taking a different amount; thank you

## 2016-05-06 MED ORDER — LOSARTAN POTASSIUM 50 MG PO TABS: 75.0000 mg | ORAL_TABLET | Freq: Every day | ORAL | 0 refills | Status: DC

## 2016-05-06 NOTE — Telephone Encounter (Signed)
Pt is taking 1.5 tablet which is a total of 75 mg every. Also, For some reason it was sent to [facility] even though put it in to be sent to [facility] in [place]. Pt verified that it needs to be sent to [facility] in [place].

## 2016-05-06 NOTE — Telephone Encounter (Signed)
I updated pharmacy and sent Rx

## 2016-05-19 ENCOUNTER — Ambulatory Visit (INDEPENDENT_AMBULATORY_CARE_PROVIDER_SITE_OTHER): Payer: Commercial Managed Care - PPO | Admitting: Podiatry

## 2016-05-19 DIAGNOSIS — G5792 Unspecified mononeuropathy of left lower limb: Secondary | ICD-10-CM

## 2016-05-19 DIAGNOSIS — B351 Tinea unguium: Secondary | ICD-10-CM

## 2016-05-19 DIAGNOSIS — M7752 Other enthesopathy of left foot: Secondary | ICD-10-CM | POA: Diagnosis not present

## 2016-05-19 DIAGNOSIS — M79672 Pain in left foot: Secondary | ICD-10-CM

## 2016-05-19 DIAGNOSIS — M659 Synovitis and tenosynovitis, unspecified: Secondary | ICD-10-CM | POA: Diagnosis not present

## 2016-05-19 DIAGNOSIS — T85848D Pain due to other internal prosthetic devices, implants and grafts, subsequent encounter: Secondary | ICD-10-CM

## 2016-06-02 ENCOUNTER — Ambulatory Visit: Payer: Commercial Managed Care - PPO | Admitting: Podiatry

## 2016-06-03 NOTE — Progress Notes (Signed)
Subjective: Patient presents today for follow-up evaluation of left lower extremity pain. Patient also presents to pick up her custom molded orthotics today. Patient still complains of left foot pain. She states that her foot turns inward and it feels like there is lightening bolts in her left lower extremity.  Objective: Physical Exam General: The patient is alert and oriented x3 in no acute distress.  Dermatology: Hyperkeratotic, discolored, thickened, onychodystrophy of nails noted bilaterally.  Skin is warm, dry and supple bilateral lower extremities. Negative for open lesions or macerations.  Vascular: Palpable pedal pulses bilaterally. No edema or erythema noted. Capillary refill within normal limits.  Neurological: Epicritic and protective threshold grossly intact bilaterally.   Musculoskeletal Exam: Pain on palpation to the anterior medial and lateral aspects of the patient's left ankle joint. Negative range of motion to the subtalar joint left lower extremity secondary to surgical subtalar joint arthrodesis and Lapidus. There is palpable prominent screw heads noted to the medial cuneiform of the left lower extremity as well. Percussion of the screw heads elicits a positive Tinel sign consistent with a neuritis and nerve irritation to the left lower extremity  Assessment: #1 onychomycosis bilateral toenails #2 ankle joint synovitis with capsulitis left lower extremity #5 pain in left ankle #6 prominent orthopedic screws - Orthohelix 4.0 screws to metatarsal cuneiform joint left foot as per operative note dated 10/20/2013 #7 painful hardware left lower extremity - 7.55mm Orthohelix subtalar screws x 2. 4.43mm orthohelix lapidus screws x 2.  #8 neuritis left foot  Plan of Care:  #1 Patient was evaluated. #2 orthotics were dispensed today. Instructions for break-in were provided #3 continue antifungal nail lacquer through [facility] #4 continue all conservative modalities for  left foot and ankle pain. #5 return to clinic in 4 weeks  Edrick Kins, DPM [facility]  Dr. Edrick Kins, [address]                                        [place], [place] [address]                Office [phone]  Fax [phone]

## 2016-06-16 ENCOUNTER — Ambulatory Visit: Payer: Commercial Managed Care - PPO | Admitting: Podiatry

## 2016-09-25 ENCOUNTER — Encounter: Payer: Self-pay | Admitting: Podiatry

## 2016-09-25 ENCOUNTER — Ambulatory Visit (INDEPENDENT_AMBULATORY_CARE_PROVIDER_SITE_OTHER): Payer: Commercial Managed Care - PPO | Admitting: Podiatry

## 2016-09-25 DIAGNOSIS — B351 Tinea unguium: Secondary | ICD-10-CM

## 2016-09-25 MED ORDER — NONFORMULARY OR COMPOUNDED ITEM: 2 refills | Status: DC

## 2016-09-28 ENCOUNTER — Other Ambulatory Visit: Payer: Self-pay | Admitting: Family Medicine

## 2016-09-28 DIAGNOSIS — I10 Essential (primary) hypertension: Secondary | ICD-10-CM

## 2016-09-28 NOTE — Telephone Encounter (Signed)
Please ask pt to schedule an appt please Also, verify how she's taking the losartan She got a 3 month Rx on 10/15/15 and another 3 month Rx on 05/06/16 If she's only taking 1 pill a day, I have to prescribe it as 1 pill a day based on fill and compliance history Please ask how exactly she's taking it Last visit was October 2017 and she was supposed to come back for follow-up I'll send in Rx and we look forward to seeing her soon

## 2016-09-28 NOTE — Telephone Encounter (Signed)
Pt is almost out of losartan (4 days worth left) and is requesting a refill. She is changing pharmacy and is asking that you please send to [facility]. Asking to give her a call once this is complete.

## 2016-09-29 ENCOUNTER — Telehealth: Payer: Self-pay | Admitting: Family Medicine

## 2016-09-29 MED ORDER — LOSARTAN POTASSIUM 50 MG PO TABS: 75.0000 mg | ORAL_TABLET | Freq: Every day | ORAL | 0 refills | Status: DC

## 2016-09-29 NOTE — Telephone Encounter (Signed)
Left detailed voicemail to call us back with directions on how she is takling also to schedule an appt

## 2016-09-29 NOTE — Telephone Encounter (Signed)
PT SAID THAT THE MEDICATION SHE IS NEEDING REFILED IS [facility]AN.[facility]

## 2016-09-29 NOTE — Telephone Encounter (Signed)
PT HAS CALLED BACK AND WANTS TO MAKE SURE THAT HER MEDICATION IS S[facility] TO [facility] [place]. TOLD HER THAT MESSAGE WAS PLACED IN THE FIRST MESSAGE AND THAT THEY SHOULD SEE THAT IN THE MESSAGE.

## 2016-09-29 NOTE — Telephone Encounter (Signed)
Patient states she is taking as directed, she says her endo wrote rx at one point and wrote it wrong?

## 2016-10-04 NOTE — Progress Notes (Signed)
Subjective: Patient presents today for follow-up treatment and evaluation of bilateral toenail fungus to the bilateral great toes. More prominent on the left great toe. Patient would like to discuss treatment options.  Objective: Physical Exam General: The patient is alert and oriented x3 in no acute distress.  Dermatology: Hyperkeratotic, discolored, thickened, onychodystrophy of nails noted bilaterally.  Skin is warm, dry and supple bilateral lower extremities. Negative for open lesions or macerations.  Vascular: Palpable pedal pulses bilaterally. No edema or erythema noted. Capillary refill within normal limits.  Neurological: Epicritic and protective threshold grossly intact bilaterally.   Musculoskeletal Exam: Pain on palpation to the anterior medial and lateral aspects of the patient's left ankle joint. Negative range of motion to the subtalar joint left lower extremity secondary to surgical subtalar joint arthrodesis and Lapidus. There is palpable prominent screw heads noted to the medial cuneiform of the left lower extremity as well. Percussion of the screw heads elicits a positive Tinel sign consistent with a neuritis and nerve irritation to the left lower extremity  Assessment: #1 onychomycosis bilateral toenails #2 ankle joint synovitis with capsulitis left lower extremity #5 pain in left ankle #6 prominent orthopedic screws - Orthohelix 4.0 screws to metatarsal cuneiform joint left foot as per operative note dated 10/20/2013 #7 painful hardware left lower extremity - 7.20mm Orthohelix subtalar screws x 2. 4.48mm orthohelix lapidus screws x 2.  #8 neuritis left foot  Plan of Care:  #1 Patient was evaluated. #2 today were prescribed antifungal nail lacquer through [facility] #3   appointment with Jobe Marker and for fungal nail laser treatment  #4 return to clinic when necessary  Edrick Kins, DPM [facility]  Dr. Edrick Kins, [address]                                        [place],  [address]                Office [phone]  Fax [phone]

## 2016-10-07 ENCOUNTER — Ambulatory Visit: Payer: Commercial Managed Care - PPO | Admitting: Obstetrics & Gynecology

## 2016-10-08 ENCOUNTER — Ambulatory Visit: Payer: Managed Care, Other (non HMO) | Admitting: Family Medicine

## 2016-10-13 ENCOUNTER — Encounter: Payer: Self-pay | Admitting: Family Medicine

## 2016-10-13 ENCOUNTER — Ambulatory Visit (INDEPENDENT_AMBULATORY_CARE_PROVIDER_SITE_OTHER): Payer: Commercial Managed Care - PPO | Admitting: Family Medicine

## 2016-10-13 VITALS — BP 118/68 | HR 97 | Temp 97.7°F | Resp 14 | Wt 217.6 lb

## 2016-10-13 DIAGNOSIS — E559 Vitamin D deficiency, unspecified: Secondary | ICD-10-CM

## 2016-10-13 DIAGNOSIS — K603 Anal fistula: Secondary | ICD-10-CM

## 2016-10-13 DIAGNOSIS — Z6834 Body mass index (BMI) 34.0-34.9, adult: Secondary | ICD-10-CM

## 2016-10-13 DIAGNOSIS — E6609 Other obesity due to excess calories: Secondary | ICD-10-CM | POA: Diagnosis not present

## 2016-10-13 DIAGNOSIS — Z5181 Encounter for therapeutic drug level monitoring: Secondary | ICD-10-CM

## 2016-10-13 DIAGNOSIS — E785 Hyperlipidemia, unspecified: Secondary | ICD-10-CM

## 2016-10-13 DIAGNOSIS — Z1239 Encounter for other screening for malignant neoplasm of breast: Secondary | ICD-10-CM

## 2016-10-13 DIAGNOSIS — E039 Hypothyroidism, unspecified: Secondary | ICD-10-CM | POA: Diagnosis not present

## 2016-10-13 DIAGNOSIS — E8881 Metabolic syndrome: Secondary | ICD-10-CM

## 2016-10-13 DIAGNOSIS — D414 Neoplasm of uncertain behavior of bladder: Secondary | ICD-10-CM

## 2016-10-13 DIAGNOSIS — Z1231 Encounter for screening mammogram for malignant neoplasm of breast: Secondary | ICD-10-CM | POA: Diagnosis not present

## 2016-10-13 DIAGNOSIS — E063 Autoimmune thyroiditis: Secondary | ICD-10-CM

## 2016-10-13 DIAGNOSIS — R7301 Impaired fasting glucose: Secondary | ICD-10-CM

## 2016-10-13 HISTORY — DX: Metabolic syndrome: E88.81

## 2016-10-13 HISTORY — DX: Metabolic syndrome: E88.810

## 2016-10-13 NOTE — Assessment & Plan Note (Signed)
Managed by surgeon, Dr. Pat Patrick; not an issue

## 2016-10-13 NOTE — Assessment & Plan Note (Signed)
Check labs 

## 2016-10-13 NOTE — Assessment & Plan Note (Signed)
Check level when she returns and supplement if needed

## 2016-10-13 NOTE — Patient Instructions (Addendum)
Return for fasting labs this week or next Try fun new foods Check out the information at familydoctor.org entitled "Nutrition for Weight Loss: What You Need to Know about Fad Diets" Try to lose between 1-2 pounds per week by taking in fewer calories and burning off more calories You can succeed by limiting portions, limiting foods dense in calories and fat, becoming more active, and drinking 8 glasses of water a day (64 ounces) Don't skip meals, especially breakfast, as skipping meals may alter your metabolism Do not use over-the-counter weight loss pills or gimmicks that claim rapid weight loss A healthy BMI (or body mass index) is between 18.5 and 24.9 You can calculate your ideal BMI at the [place] website ClubMonetize.fr

## 2016-10-13 NOTE — Assessment & Plan Note (Signed)
Patient declines statin; wishes to try healthier eating and weight loss; return for fasting labs, then will likely need to recheck labs again after three months of TLC

## 2016-10-13 NOTE — Assessment & Plan Note (Signed)
Check fasting glucose and A1c later this week

## 2016-10-13 NOTE — Assessment & Plan Note (Signed)
Managed by Dr. Solum 

## 2016-10-13 NOTE — Assessment & Plan Note (Signed)
Going to work on weight loss, more water, fewer, liquid calories

## 2016-10-13 NOTE — Assessment & Plan Note (Signed)
Encouraged weight loss, healthier eating; return for labs

## 2016-10-13 NOTE — Progress Notes (Signed)
BP 118/68   Pulse 97   Temp 97.7 F (36.5 C) (Oral)   Resp 14   Wt 217 lb 9.6 oz (98.7 kg)   SpO2 98%   BMI 34.08 kg/m    Subjective:    Patient ID: Desiree Walker, female    DOB: [birth year], 56 y.o.   MRN: [id number]  HPI: Desiree Walker is a 55 y.o. female  Chief Complaint  Patient presents with  . Follow-up  . Medication Refill    HPI  Patient is here for follow-up and to get medication refills  Hypertension; well-controlled Changed her generic manufacturer of her BP medicine; made her light-headed; ordered the other manufacturer, feeling fine  Impaired fasting diabetes; mother also borderline diabetic; drinks some sweet tea; trying to drink more water; lemonade; loves skim milk  Hashimoto's Goes to endocrinologist; Dr. Gabriel Carina Lab Results  Component Value Date   TSH 1.270 12/20/2015    Reviewed her [facility] report: Metabolic syndrome; meets criteria Mammogram ordered Pap done elsewhere Colonoscopy was done 2014; next due 2019 High cholesterol; she wants to eat better; does not want cholesterol medicine Discussed hep C testing, agrees She goes for pap smear in August with GYN Tetanus booster, she requests to get that after vacation  She wants to start eating better Vitamin D deficiency, last level was 18.6 She has a bladder spot that she will call Dr. Jacqlyn Larsen about  Depression screen [facility] 2/9 10/13/2016 12/17/2015 08/05/2015 12/25/2014 10/31/2014  Decreased Interest 0 0 0 0 0  Down, Depressed, Hopeless 0 1 2 0 0  PHQ - 2 Score 0 1 2 0 0  Altered sleeping - - 0 - -  Tired, decreased energy - - 1 - -  Change in appetite - - 0 - -  Feeling bad or failure about yourself  - - 2 - -  Trouble concentrating - - 2 - -  Moving slowly or fidgety/restless - - 0 - -  Suicidal thoughts - - 0 - -  PHQ-9 Score - - 7 - -  Difficult doing work/chores - - Somewhat difficult - -    Relevant past medical, surgical, family and social history reviewed Past Medical History:    Diagnosis Date  . Anemia    H/O  . Anxiety   . Complication of anesthesia   . GERD (gastroesophageal reflux disease)    NO MEDS  . Hyperlipidemia   . Hypertension    NO MEDS CURRENTLY-OFF MEDS SINCE 2015  . Hypothyroidism    Hashimoto's per patient  . IFG (impaired fasting glucose) 08/05/2015  . Metabolic syndrome 5/46/5035  . Palpitations 2012   Evaluated by Ida Rogue, MD, Holter moniter  . PONV (postoperative nausea and vomiting)   . Rectal mass   . Thyroid disease    unspecified hypothyroidism   Past Surgical History:  Procedure Laterality Date  . ANAL FISTULOTOMY N/A 11/30/2014   Procedure: ANAL FISTULOTOMY;  Surgeon: Dia Crawford III, MD;  Location: [facility] ORS;  Service: General;  Laterality: N/A;  . CESAREAN SECTION    . DILATION AND CURETTAGE OF UTERUS    . left foot surgery    . RECTAL EXAM UNDER ANESTHESIA N/A 11/30/2014   Procedure: RECTAL EXAM UNDER ANESTHESIA;  Surgeon: Dia Crawford III, MD;  Location: [facility] ORS;  Service: General;  Laterality: N/A;   Family History  Problem Relation Age of Onset  . Hypertension Mother   . Heart disease Mother 62       "blockages"  .  Alzheimer's disease Mother   . Diverticulitis Father   . Hernia Father   . Depression Father   . Alzheimer's disease Father   . Cancer Sister 62       uterine  . Hypothyroidism Sister   . Cancer Maternal Aunt 44       breast  . Hypothyroidism Daughter   . Hypertension Brother   . Stroke Maternal Grandfather   . Heart attack Maternal Grandfather   . Stroke Paternal Grandmother   . Hypothyroidism Sister   . Hypothyroidism Daughter    Social History   Social History  . Marital status: Married    Spouse name: N/A  . Number of children: N/A  . Years of education: N/A   Occupational History  . Not on file.   Social History Main Topics  . Smoking status: Never Smoker  . Smokeless tobacco: Never Used  . Alcohol use No  . Drug use: No  . Sexual activity: Yes   Other Topics Concern  .  Not on file   Social History Narrative  . No narrative on file    Interim medical history since last visit reviewed. Allergies and medications reviewed  Review of Systems Per HPI unless specifically indicated above     Objective:    BP 118/68   Pulse 97   Temp 97.7 F (36.5 C) (Oral)   Resp 14   Wt 217 lb 9.6 oz (98.7 kg)   SpO2 98%   BMI 34.08 kg/m   Wt Readings from Last 3 Encounters:  10/13/16 217 lb 9.6 oz (98.7 kg)  02/07/16 215 lb (97.5 kg)  12/25/15 211 lb 4 oz (95.8 kg)    Physical Exam  Constitutional: She appears well-developed and well-nourished. No distress.  Weight gain 6+ pounds over last 9-1/2 months  HENT:  Head: Normocephalic and atraumatic.  Eyes: EOM are normal. No scleral icterus.  Neck: No thyromegaly present.  Cardiovascular: Normal rate, regular rhythm and normal heart sounds.   No murmur heard. Pulmonary/Chest: Effort normal and breath sounds normal. No respiratory distress. She has no wheezes.  Abdominal: Soft.  Musculoskeletal: Normal range of motion. She exhibits no edema.  Neurological: She is alert. She exhibits normal muscle tone.  Skin: Skin is warm and dry. She is not diaphoretic. No pallor.  Psychiatric: She has a normal mood and affect. Her behavior is normal. Judgment and thought content normal.   Results for orders placed or performed in visit on 12/20/15  CBC with Differential/Platelet  Result Value Ref Range   WBC 6.0 3.4 - 10.8 x10E3/uL   RBC 4.90 3.77 - 5.28 x10E6/uL   Hemoglobin 13.3 11.1 - 15.9 g/dL   Hematocrit 38.8 34.0 - 46.6 %   MCV 79 79 - 97 fL   MCH 27.1 26.6 - 33.0 pg   MCHC 34.3 31.5 - 35.7 g/dL   RDW 13.1 12.3 - 15.4 %   Platelets 230 150 - 379 x10E3/uL   Neutrophils 58 Not Estab. %   Lymphs 33 Not Estab. %   Monocytes 7 Not Estab. %   Eos 2 Not Estab. %   Basos 0 Not Estab. %   Neutrophils Absolute 3.4 1.4 - 7.0 x10E3/uL   Lymphocytes Absolute 2.0 0.7 - 3.1 x10E3/uL   Monocytes Absolute 0.4 0.1 - 0.9  x10E3/uL   EOS (ABSOLUTE) 0.1 0.0 - 0.4 x10E3/uL   Basophils Absolute 0.0 0.0 - 0.2 x10E3/uL   Immature Granulocytes 0 Not Estab. %   Immature Grans (Abs)  0.0 0.0 - 0.1 L38B0/FB  Basic metabolic panel  Result Value Ref Range   Glucose 108 (H) 65 - 99 mg/dL   BUN 15 6 - 24 mg/dL   Creatinine, Ser 0.84 0.57 - 1.00 mg/dL   GFR calc non Af Amer 78 >59 mL/min/1.73   GFR calc Af Amer 90 >59 mL/min/1.73   BUN/Creatinine Ratio 18 9 - 23   Sodium 140 134 - 144 mmol/L   Potassium 4.5 3.5 - 5.2 mmol/L   Chloride 102 96 - 106 mmol/L   CO2 24 18 - 29 mmol/L   Calcium 9.6 8.7 - 10.2 mg/dL  T4, free  Result Value Ref Range   Free T4 1.61 0.82 - 1.77 ng/dL  TSH  Result Value Ref Range   TSH 1.270 0.450 - 4.500 uIU/mL  VITAMIN D 25 Hydroxy (Vit-D Deficiency, Fractures)  Result Value Ref Range   Vit D, 25-Hydroxy 16.7 (L) 30.0 - 100.0 ng/mL  Magnesium  Result Value Ref Range   Magnesium 2.2 1.6 - 2.3 mg/dL      Assessment & Plan:   Problem List Items Addressed This Visit      Digestive   Anal fistula    Managed by surgeon, Dr. Pat Patrick; not an issue        Endocrine   IFG (impaired fasting glucose) - Primary    Check fasting glucose and A1c later this week      Relevant Orders   Hemoglobin A1c   Hypothyroidism, adult    Managed by Dr. Gabriel Carina      Autoimmune lymphocytic chronic thyroiditis    Managed by Dr. Gabriel Carina        Genitourinary   Bladder neoplasm of uncertain malignant potential    She says she will contact her [facility] doctor about follow-up        Other   Vitamin D deficiency    Check level when she returns and supplement if needed      Relevant Orders   VITAMIN D 25 Hydroxy (Vit-D Deficiency, Fractures)   Obesity    Going to work on weight loss, more water, fewer, liquid calories      Metabolic syndrome    Encouraged weight loss, healthier eating; return for labs      Relevant Orders   Hemoglobin A1c   Lipid panel   Medication monitoring encounter     Check labs      Relevant Orders   CBC with Differential/Platelet   COMPLETE METABOLIC PANEL WITH GFR   Hyperlipidemia LDL goal <130    Patient declines statin; wishes to try healthier eating and weight loss; return for fasting labs, then will likely need to recheck labs again after three months of TLC      Relevant Orders   Lipid panel    Other Visit Diagnoses    Screening for breast cancer       Relevant Orders   MM Digital Screening       Follow up plan: No Follow-up on file.  An after-visit summary was printed and given to the patient at [facility].  Please see the patient instructions which may contain other information and recommendations beyond what is mentioned above in the assessment and plan.  No orders of the defined types were placed in this encounter.   Orders Placed This Encounter  Procedures  . MM Digital Screening  . CBC with Differential/Platelet  . COMPLETE METABOLIC PANEL WITH GFR  . Hemoglobin A1c  . Lipid panel  .  VITAMIN D 25 Hydroxy (Vit-D Deficiency, Fractures)

## 2016-10-13 NOTE — Assessment & Plan Note (Signed)
She says she will contact her [facility] doctor about follow-up

## 2016-10-15 LAB — COMPLETE METABOLIC PANEL WITH GFR
ALBUMIN: 3.9 g/dL (ref 3.6–5.1)
ALT: 12 U/L (ref 6–29)
AST: 13 U/L (ref 10–35)
Alkaline Phosphatase: 84 U/L (ref 33–130)
BUN: 18 mg/dL (ref 7–25)
CHLORIDE: 107 mmol/L (ref 98–110)
CO2: 22 mmol/L (ref 20–31)
CREATININE: 0.87 mg/dL (ref 0.50–1.05)
Calcium: 9.7 mg/dL (ref 8.6–10.4)
GFR, Est African American: 86 mL/min (ref >=60)
GFR, Est Non African American: 74 mL/min (ref >=60)
GLUCOSE: 102 mg/dL — AB (ref 65–99)
POTASSIUM: 4.3 mmol/L (ref 3.5–5.3)
SODIUM: 139 mmol/L (ref 135–146)
Total Bilirubin: 0.3 mg/dL (ref 0.2–1.2)
Total Protein: 6.7 g/dL (ref 6.1–8.1)

## 2016-10-15 LAB — CBC WITH DIFFERENTIAL/PLATELET
BASOS ABS: 43 {cells}/uL (ref 0–200)
BASOS PCT: 1 %
EOS PCT: 4 %
Eosinophils Absolute: 172 cells/uL (ref 15–500)
HEMATOCRIT: 41.4 % (ref 35.0–45.0)
HEMOGLOBIN: 13.5 g/dL (ref 11.7–15.5)
LYMPHS ABS: 1763 {cells}/uL (ref 850–3900)
LYMPHS PCT: 41 %
MCH: 26.6 pg — ABNORMAL LOW (ref 27.0–33.0)
MCHC: 32.6 g/dL (ref 32.0–36.0)
MCV: 81.7 fL (ref 80.0–100.0)
MONOS PCT: 5 %
MPV: 9.7 fL (ref 7.5–12.5)
Monocytes Absolute: 215 cells/uL (ref 200–950)
NEUTROS PCT: 49 %
Neutro Abs: 2107 cells/uL (ref 1500–7800)
Platelets: 215 10*3/uL (ref 140–400)
RBC: 5.07 MIL/uL (ref 3.80–5.10)
RDW: 14 % (ref 11.0–15.0)
WBC: 4.3 10*3/uL (ref 3.8–10.8)

## 2016-10-15 LAB — LIPID PANEL
Cholesterol: 195 mg/dL (ref <200)
HDL: 48 mg/dL — AB (ref >50)
LDL CALC: 114 mg/dL — AB (ref <100)
TRIGLYCERIDES: 167 mg/dL — AB (ref <150)
Total CHOL/HDL Ratio: 4.1 Ratio (ref <5.0)
VLDL: 33 mg/dL — AB (ref <30)

## 2016-10-16 LAB — HEMOGLOBIN A1C
Hgb A1c MFr Bld: 5.7 % — ABNORMAL HIGH (ref <5.7)
Mean Plasma Glucose: 117 mg/dL

## 2016-10-16 LAB — VITAMIN D 25 HYDROXY (VIT D DEFICIENCY, FRACTURES): VIT D 25 HYDROXY: 21 ng/mL — AB (ref 30–100)

## 2016-10-20 ENCOUNTER — Other Ambulatory Visit: Payer: Self-pay | Admitting: Family Medicine

## 2016-10-20 MED ORDER — LEVOTHYROXINE SODIUM 112 MCG PO TABS: 112.0000 ug | ORAL_TABLET | Freq: Every day | ORAL | 1 refills | Status: DC

## 2016-10-20 MED ORDER — VITAMIN D (ERGOCALCIFEROL) 1.25 MG (50000 UNIT) PO CAPS: 50000.0000 [IU] | ORAL_CAPSULE | ORAL | 1 refills | Status: AC

## 2016-10-20 NOTE — Progress Notes (Signed)
Rx vitamin D every 2 weeks for 8 weeks

## 2016-10-23 ENCOUNTER — Encounter: Payer: Self-pay | Admitting: Obstetrics & Gynecology

## 2016-10-23 ENCOUNTER — Ambulatory Visit (INDEPENDENT_AMBULATORY_CARE_PROVIDER_SITE_OTHER): Payer: Commercial Managed Care - PPO | Admitting: Obstetrics & Gynecology

## 2016-10-23 VITALS — BP 118/78 | Ht 67.0 in | Wt 219.0 lb

## 2016-10-23 DIAGNOSIS — R1031 Right lower quadrant pain: Secondary | ICD-10-CM | POA: Diagnosis not present

## 2016-10-23 DIAGNOSIS — Z86018 Personal history of other benign neoplasm: Secondary | ICD-10-CM | POA: Insufficient documentation

## 2016-10-23 DIAGNOSIS — Z1231 Encounter for screening mammogram for malignant neoplasm of breast: Secondary | ICD-10-CM

## 2016-10-23 DIAGNOSIS — Z1239 Encounter for other screening for malignant neoplasm of breast: Secondary | ICD-10-CM

## 2016-10-23 NOTE — Progress Notes (Signed)
Gynecology Pelvic Pain Evaluation   Chief Complaint:  Chief Complaint  Patient presents with  . Abdominal Pain    has improved    History of Present Illness:   Patient is a 56 y.o. [id number] who LMP was No LMP recorded. Patient is postmenopausal., presents today for a problem visit.  She complains of pain.   Her pain is localized to the RLQ area, described as constant, stabbing and aching, began about a month ago and its severity is described as moderate. The pain radiates to the  Non-radiating. She has these associated symptoms which include abdominal pain. Patient has these modifiers which include relaxation and pain medication that make it better and unable to associate with any factor that make it worse.  Context includes: spontaneous.  Symptoms improve with NSAIDs, recumbency and have since resolved after 1 week of th pain.  Previous evaluation: ultrasound showing fibroids (2 small ones) last year. Prior Diagnosis: uterine fibroids. Previous Treatment: none.  PMHx: She  has a past medical history of Anemia; Anxiety; Complication of anesthesia; GERD (gastroesophageal reflux disease); Hyperlipidemia; Hypertension; Hypothyroidism; IFG (impaired fasting glucose) (08/05/2015); Metabolic syndrome (3/55/7322); Palpitations (2012); PONV (postoperative nausea and vomiting); Rectal mass; and Thyroid disease. Also,  has a past surgical history that includes Dilation and curettage of uterus; Cesarean section; left foot surgery; Rectal exam under anesthesia (N/A, 11/30/2014); and Anal fistulotomy (N/A, 11/30/2014)., family history includes Alzheimer's disease in her father and mother; Cancer (age of onset: 37) in her maternal aunt; Cancer (age of onset: 52) in her sister; Depression in her father; Diverticulitis in her father; Heart attack in her maternal grandfather; Heart disease (age of onset: 51) in her mother; Hernia in her father; Hypertension in her brother and mother; Hypothyroidism in her daughter,  daughter, sister, and sister; Stroke in her maternal grandfather and paternal grandmother.,  reports that she has never smoked. She has never used smokeless tobacco. She reports that she does not drink alcohol or use drugs.  She has a current medication list which includes the following prescription(s): aspirin ec, levothyroxine, losartan, NONFORMULARY OR COMPOUNDED ITEM, NONFORMULARY OR COMPOUNDED ITEM, NONFORMULARY OR COMPOUNDED ITEM, sertraline, terbinafine, and vitamin d (ergocalciferol), and the following Facility-Administered Medications: betamethasone acetate-betamethasone sodium phosphate. Also, is allergic to scopolamine.  Review of Systems  Constitutional: Negative for chills, fever and malaise/fatigue.  HENT: Negative for congestion, sinus pain and sore throat.   Eyes: Negative for blurred vision and pain.  Respiratory: Negative for cough and wheezing.   Cardiovascular: Negative for chest pain and leg swelling.  Gastrointestinal: Negative for abdominal pain, constipation, diarrhea, heartburn, nausea and vomiting.  Genitourinary: Negative for dysuria, frequency, hematuria and urgency.  Musculoskeletal: Negative for back pain, joint pain, myalgias and neck pain.  Skin: Negative for itching and rash.  Neurological: Negative for dizziness, tremors and weakness.  Endo/Heme/Allergies: Does not bruise/bleed easily.  Psychiatric/Behavioral: Negative for depression. The patient is not nervous/anxious and does not have insomnia.    Objective: BP 118/78   Ht 5\' 7"  (1.702 m)   Wt 219 lb (99.3 kg)   BMI 34.30 kg/m  Physical Exam  Constitutional: She is oriented to person, place, and time. She appears well-developed and well-nourished. No distress.  Musculoskeletal: Normal range of motion.  Neurological: She is alert and oriented to person, place, and time.  Skin: Skin is warm and dry.  Psychiatric: She has a normal mood and affect.  Vitals reviewed.  Assessment: 56 y.o. [id number] with  Functional: MS, GI, or GYN etiologies  discussed; possible growth of fibroids so will check that by [place]..  1. Right lower quadrant pain - US Transvaginal Non-OB; Future  2. History of uterine fibroid - US Transvaginal Non-OB; Future  3. Screening for breast cancer - MM DIGITAL SCREENING BILATERAL; Future  Barnett Applebaum, MD, [facility], [facility] 10/23/2016  10:47 AM

## 2016-10-23 NOTE — Patient Instructions (Signed)
  Mammogram every year    Call [phone] to schedule at [place]  

## 2016-11-02 ENCOUNTER — Ambulatory Visit (INDEPENDENT_AMBULATORY_CARE_PROVIDER_SITE_OTHER): Payer: Commercial Managed Care - PPO

## 2016-11-02 ENCOUNTER — Encounter: Payer: Self-pay | Admitting: Obstetrics & Gynecology

## 2016-11-02 ENCOUNTER — Ambulatory Visit (INDEPENDENT_AMBULATORY_CARE_PROVIDER_SITE_OTHER): Payer: Commercial Managed Care - PPO | Admitting: Obstetrics & Gynecology

## 2016-11-02 VITALS — BP 130/90 | HR 79 | Ht 67.0 in | Wt 217.0 lb

## 2016-11-02 DIAGNOSIS — Z86018 Personal history of other benign neoplasm: Secondary | ICD-10-CM | POA: Diagnosis not present

## 2016-11-02 DIAGNOSIS — Z0000 Encounter for general adult medical examination without abnormal findings: Secondary | ICD-10-CM

## 2016-11-02 DIAGNOSIS — R1031 Right lower quadrant pain: Secondary | ICD-10-CM | POA: Diagnosis not present

## 2016-11-02 DIAGNOSIS — E6609 Other obesity due to excess calories: Secondary | ICD-10-CM

## 2016-11-02 DIAGNOSIS — Z01419 Encounter for gynecological examination (general) (routine) without abnormal findings: Secondary | ICD-10-CM | POA: Diagnosis not present

## 2016-11-02 DIAGNOSIS — Z1211 Encounter for screening for malignant neoplasm of colon: Secondary | ICD-10-CM

## 2016-11-02 DIAGNOSIS — Z6834 Body mass index (BMI) 34.0-34.9, adult: Secondary | ICD-10-CM

## 2016-11-02 NOTE — Patient Instructions (Addendum)
PAP every three years Mammogram every year    Call [phone] to schedule at [place] Colonoscopy every 5 or 10 years Labs yearly (with PCP)   

## 2016-11-02 NOTE — Progress Notes (Signed)
HPI:      Ms. JAPNEET STAGGS is a 56 y.o. [phone] who LMP was in the past, she presents today for her annual examination.  The patient has no complaints today. The patient is not sexually active. Herlast pap: approximate date 2017 and was normal and last mammogram: approximate date 2017 and was normal.  The patient does perform self breast exams.  There is no notable family history of breast or ovarian cancer in her family. The patient is not taking hormone replacement therapy. Patient denies post-menopausal vaginal bleeding.   The patient has regular exercise: yes. The patient denies current symptoms of depression.    GYN Hx: Last Colonoscopy:5 years ago. Normal.  Last DEXA: never ago.    PMHx: Past Medical History:  Diagnosis Date  . Anemia    H/O  . Anxiety   . Complication of anesthesia   . GERD (gastroesophageal reflux disease)    NO MEDS  . Hyperlipidemia   . Hypertension    NO MEDS CURRENTLY-OFF MEDS SINCE 2015  . Hypothyroidism    Hashimoto's per patient  . IFG (impaired fasting glucose) 08/05/2015  . Metabolic syndrome 3/61/4431  . Palpitations 2012   Evaluated by Ida Rogue, MD, Holter moniter  . PONV (postoperative nausea and vomiting)   . Rectal mass   . Thyroid disease    unspecified hypothyroidism   Past Surgical History:  Procedure Laterality Date  . ANAL FISTULOTOMY N/A 11/30/2014   Procedure: ANAL FISTULOTOMY;  Surgeon: Dia Crawford III, MD;  Location: [facility] ORS;  Service: General;  Laterality: N/A;  . CESAREAN SECTION    . DILATION AND CURETTAGE OF UTERUS    . left foot surgery    . RECTAL EXAM UNDER ANESTHESIA N/A 11/30/2014   Procedure: RECTAL EXAM UNDER ANESTHESIA;  Surgeon: Dia Crawford III, MD;  Location: [facility] ORS;  Service: General;  Laterality: N/A;   Family History  Problem Relation Age of Onset  . Hypertension Mother   . Heart disease Mother 45       "blockages"  . Alzheimer's disease Mother   . Diverticulitis Father   . Hernia Father   .  Depression Father   . Alzheimer's disease Father   . Cancer Sister 57       uterine  . Hypothyroidism Sister   . Cancer Maternal Aunt 58       breast  . Hypothyroidism Daughter   . Hypertension Brother   . Stroke Maternal Grandfather   . Heart attack Maternal Grandfather   . Stroke Paternal Grandmother   . Hypothyroidism Sister   . Hypothyroidism Daughter    Social History  Substance Use Topics  . Smoking status: Never Smoker  . Smokeless tobacco: Never Used  . Alcohol use No    Current Outpatient Prescriptions:  .  aspirin EC 81 MG tablet, Take 1 tablet (81 mg total) by mouth daily., Disp: , Rfl:  .  levothyroxine (SYNTHROID, LEVOTHROID) 112 MCG tablet, Take 1 tablet (112 mcg total) by mouth daily before breakfast., Disp: 90 tablet, Rfl: 1 .  losartan (COZAAR) 50 MG tablet, Take 1.5 tablets (75 mg total) by mouth daily., Disp: 135 tablet, Rfl: 0 .  NONFORMULARY OR COMPOUNDED ITEM, Apply 1 drop topically daily. Nail Lacquer: Fluconazole 2% Terbinafine 1% DMSO, Disp: 30 each, Rfl: 2 .  NONFORMULARY OR COMPOUNDED ITEM, , Disp: , Rfl:  .  NONFORMULARY OR COMPOUNDED ITEM, See pharmacy note (Patient not taking: Reported on 10/13/2016), Disp: 120 each, Rfl: 2 .  sertraline (ZOLOFT) 50 MG tablet, One-half of a pill by mouth daily x 6 days, then one whole pill daily, Disp: 30 tablet, Rfl: 0 .  terbinafine (LAMISIL) 250 MG tablet, Take 1 tablet (250 mg total) by mouth daily. (Patient not taking: Reported on 10/13/2016), Disp: 30 tablet, Rfl: 2 .  Vitamin D, Ergocalciferol, (DRISDOL) 50000 units CAPS capsule, Take 1 capsule (50,000 Units total) by mouth every 14 (fourteen) days., Disp: 2 capsule, Rfl: 1  Current Facility-Administered Medications:  .  betamethasone acetate-betamethasone sodium phosphate (CELESTONE) injection 3 mg, 3 mg, Intramuscular, Once, Evans, Brent M, DPM Allergies: Scopolamine  Review of Systems  Constitutional: Negative for chills, fever and malaise/fatigue.  HENT:  Negative for congestion, sinus pain and sore throat.   Eyes: Negative for blurred vision and pain.  Respiratory: Negative for cough and wheezing.   Cardiovascular: Negative for chest pain and leg swelling.  Gastrointestinal: Negative for abdominal pain, constipation, diarrhea, heartburn, nausea and vomiting.  Genitourinary: Negative for dysuria, frequency, hematuria and urgency.  Musculoskeletal: Negative for back pain, joint pain, myalgias and neck pain.  Skin: Negative for itching and rash.  Neurological: Negative for dizziness, tremors and weakness.  Endo/Heme/Allergies: Does not bruise/bleed easily.  Psychiatric/Behavioral: Negative for depression. The patient is not nervous/anxious and does not have insomnia.     Objective: BP 130/90   Pulse 79   Ht 5\' 7"  (1.702 m)   Wt 217 lb (98.4 kg)   BMI 33.99 kg/m   Filed Weights   11/02/16 0900  Weight: 217 lb (98.4 kg)   Body mass index is 33.99 kg/m. Physical Exam  Constitutional: She is oriented to person, place, and time. She appears well-developed and well-nourished. No distress.  Genitourinary: Rectum normal, vagina normal and uterus normal. Pelvic exam was performed with patient supine. There is no rash or lesion on the right labia. There is no rash or lesion on the left labia. Vagina exhibits no lesion. No bleeding in the vagina. Right adnexum does not display mass and does not display tenderness. Left adnexum does not display mass and does not display tenderness. Cervix does not exhibit motion tenderness, lesion, friability or polyp.   Uterus is mobile and midaxial. Uterus is not enlarged or exhibiting a mass.  HENT:  Head: Normocephalic and atraumatic. Head is without laceration.  Right Ear: Hearing normal.  Left Ear: Hearing normal.  Nose: No epistaxis.  No foreign bodies.  Mouth/Throat: Uvula is midline, oropharynx is clear and moist and mucous membranes are normal.  Eyes: Pupils are equal, round, and reactive to light.    Neck: Normal range of motion. Neck supple. No thyromegaly present.  Cardiovascular: Normal rate and regular rhythm.  Exam reveals no gallop and no friction rub.   No murmur heard. Pulmonary/Chest: Effort normal and breath sounds normal. No respiratory distress. She has no wheezes. Right breast exhibits no mass, no skin change and no tenderness. Left breast exhibits no mass, no skin change and no tenderness.  Abdominal: Soft. Bowel sounds are normal. She exhibits no distension. There is no tenderness. There is no rebound.  Musculoskeletal: Normal range of motion.  Neurological: She is alert and oriented to person, place, and time. No cranial nerve deficit.  Skin: Skin is warm and dry.  Psychiatric: She has a normal mood and affect. Judgment normal.  Vitals reviewed.   Assessment: Annual Exam 1. History of uterine fibroid   2. Class 1 obesity due to excess calories without serious comorbidity with body mass index (BMI) of  34.0 to 34.9 in adult   3. Annual physical exam   4. Screen for colon cancer     Plan:            1.  Cervical Screening-  Pap smear schedule reviewed with patient  2. Breast screening- Exam annually and mammogram scheduled  3. Colonoscopy every 10 years, Hemoccult testing after age 52  4. Labs managed by PCP  5. Counseling for hormonal therapy: none, no change in therapy today  6. Review of ULTRASOUND.    I have personally reviewed images and report of recent ultrasound done at [facility].    Plan of management discussed with patient.      Small fibroid, no ovarian cyst or mass.  No etiology for her RLQ pain or bloating.  To see GI and otherwise monitor sx's.    F/U  Return in about 1 year (around 11/02/2017) for Annual.  Barnett Applebaum, MD, [facility], [facility] 11/02/2016  10:34 AM

## 2016-11-05 ENCOUNTER — Encounter: Payer: Self-pay | Admitting: Obstetrics and Gynecology

## 2017-01-02 ENCOUNTER — Other Ambulatory Visit: Payer: Self-pay | Admitting: Family Medicine

## 2017-01-02 DIAGNOSIS — I10 Essential (primary) hypertension: Secondary | ICD-10-CM

## 2017-01-03 ENCOUNTER — Other Ambulatory Visit: Payer: Self-pay | Admitting: Family Medicine

## 2017-01-04 NOTE — Telephone Encounter (Signed)
Left detailed voicemail

## 2017-01-04 NOTE — Telephone Encounter (Signed)
Last Cr and K+ reviewed; Rx approved 

## 2017-01-04 NOTE — Telephone Encounter (Signed)
Vitamin D RX requested Denied; she finished out the Rx vitamin D and should now be on OTC vitamin D -------------------------------------------------- Roselyn Reef, Please call patient; let her know these were the instructions from the last vitamin D level in late July (message sent in early August): Your vitamin D level is still low, so I'll suggest Rx vitamin D every 2 weeks for 8 weeks, then 1,000 iu vitamin D3 once a day.

## 2017-01-15 ENCOUNTER — Ambulatory Visit (INDEPENDENT_AMBULATORY_CARE_PROVIDER_SITE_OTHER): Payer: Commercial Managed Care - PPO | Admitting: Family Medicine

## 2017-01-15 ENCOUNTER — Encounter: Payer: Self-pay | Admitting: Family Medicine

## 2017-01-15 VITALS — BP 124/80 | HR 94 | Temp 98.0°F | Resp 14 | Ht 66.5 in | Wt 218.4 lb

## 2017-01-15 DIAGNOSIS — Z1211 Encounter for screening for malignant neoplasm of colon: Secondary | ICD-10-CM | POA: Diagnosis not present

## 2017-01-15 DIAGNOSIS — R7301 Impaired fasting glucose: Secondary | ICD-10-CM | POA: Diagnosis not present

## 2017-01-15 DIAGNOSIS — E6609 Other obesity due to excess calories: Secondary | ICD-10-CM

## 2017-01-15 DIAGNOSIS — Z6834 Body mass index (BMI) 34.0-34.9, adult: Secondary | ICD-10-CM

## 2017-01-15 DIAGNOSIS — Z0000 Encounter for general adult medical examination without abnormal findings: Secondary | ICD-10-CM | POA: Diagnosis not present

## 2017-01-15 NOTE — Progress Notes (Signed)
Patient ID: Desiree Walker, female   DOB: [birth year], 56 y.o.   MRN: [id number]   Subjective:   Desiree Walker is a 56 y.o. female here for a complete physical exam  Interim issues since last visit: no falls; no medical excitement; saw GYN and endocrinologist  USPSTF grade A and B recommendations Depression:  Depression screen [facility] 2/9 01/15/2017 10/13/2016 12/17/2015 08/05/2015 12/25/2014  Decreased Interest 0 0 0 0 0  Down, Depressed, Hopeless 0 0 1 2 0  PHQ - 2 Score 0 0 1 2 0  Altered sleeping - - - 0 -  Tired, decreased energy - - - 1 -  Change in appetite - - - 0 -  Feeling bad or failure about yourself  - - - 2 -  Trouble concentrating - - - 2 -  Moving slowly or fidgety/restless - - - 0 -  Suicidal thoughts - - - 0 -  PHQ-9 Score - - - 7 -  Difficult doing work/chores - - - Somewhat difficult -   Hypertension: taking medicine, I asked about limiting salt BP Readings from Last 3 Encounters:  01/15/17 124/80  11/02/16 130/90  10/23/16 118/78   Obesity: Wt Readings from Last 3 Encounters:  01/15/17 218 lb 6.4 oz (99.1 kg)  11/02/16 217 lb (98.4 kg)  10/23/16 219 lb (99.3 kg)   BMI Readings from Last 3 Encounters:  01/15/17 34.72 kg/m  11/02/16 33.99 kg/m  10/23/16 34.30 kg/m    Skin cancer: nothing worrisome Lung cancer:  Never smoker Breast cancer: patient is due; will get that through gynecologist, [facility] Colorectal cancer: 2014; due in 2019; Dr. Candace Cruise (he is not there any more)  BRCA gene screening: family hx of breast and/or ovarian cancer and/or metastatic prostate cancer? Maternal aunt had breast cancer; sister had ovarian cancer; she will talk to her gynecologist about fam hx; patient had [place] of ovaries Cervical cancer screening: through GYN, [facility] HIV, hep B, hep C: not needed STD testing and prevention (chl/gon/syphilis): not needed Intimate partner violence: no abuse Contraception: n/a Osteoporosis: n/a Fall prevention/vitamin D: discussed  Diet:  eats something fast at times if working late; not getting enough fruits and veggies; does get cereal; loves skim milk; not many greens; "my diet is terrible"; offered nutritionist referral; eats when she comes home late; shift is late afternoon until around midnight; went to bed last night around 3 am Exercise: no regular exercise; walks at work as part of her activities Alcohol: none Tobacco use: nonsmoker Aspirin: taking daily  Lipids:  Lab Results  Component Value Date   CHOL 195 10/13/2016   CHOL 222 (H) 08/06/2015   CHOL 234 (H) 09/14/2013   Lab Results  Component Value Date   HDL 48 (L) 10/13/2016   HDL 53 08/06/2015   HDL 51 09/14/2013   Lab Results  Component Value Date   LDLCALC 114 (H) 10/13/2016   LDLCALC 139 (H) 08/06/2015   LDLCALC 153 (H) 09/14/2013   Lab Results  Component Value Date   TRIG 167 (H) 10/13/2016   TRIG 149 08/06/2015   TRIG 149 09/14/2013   Lab Results  Component Value Date   CHOLHDL 4.1 10/13/2016   No results found for: LDLDIRECT Glucose:  Glucose  Date Value Ref Range Status  12/20/2015 108 (H) 65 - 99 mg/dL Final  10/10/2013 81 65 - 99 mg/dL Final  09/14/2013 111 (H) 65 - 99 mg/dL Final  04/10/2013 110 (H) 65 - 99 mg/dL Final  Glucose, Bld  Date Value Ref Range Status  10/13/2016 102 (H) 65 - 99 mg/dL Final   Glucose-Capillary  Date Value Ref Range Status  11/30/2014 97 65 - 99 mg/dL Final    Past Medical History:  Diagnosis Date  . Anemia    H/O  . Anxiety   . Complication of anesthesia   . Family history of breast cancer    cancer genetic testing letter sent 8/18  . Family history of ovarian cancer    cancer genetic testing letter sent 8/18  . GERD (gastroesophageal reflux disease)    NO MEDS  . Hyperlipidemia   . Hypertension    NO MEDS CURRENTLY-OFF MEDS SINCE 2015  . Hypothyroidism    Hashimoto's per patient  . IFG (impaired fasting glucose) 08/05/2015  . Metabolic syndrome 06/29/6061  . Palpitations 2012    Evaluated by Ida Rogue, MD, Holter moniter  . PONV (postoperative nausea and vomiting)   . Rectal mass   . Thyroid disease    unspecified hypothyroidism   Past Surgical History:  Procedure Laterality Date  . ANAL FISTULOTOMY N/A 11/30/2014   Procedure: ANAL FISTULOTOMY;  Surgeon: Dia Crawford III, MD;  Location: [facility] ORS;  Service: General;  Laterality: N/A;  . CESAREAN SECTION    . DILATION AND CURETTAGE OF UTERUS    . left foot surgery    . RECTAL EXAM UNDER ANESTHESIA N/A 11/30/2014   Procedure: RECTAL EXAM UNDER ANESTHESIA;  Surgeon: Dia Crawford III, MD;  Location: [facility] ORS;  Service: General;  Laterality: N/A;   Family History  Problem Relation Age of Onset  . Hypertension Mother   . Heart disease Mother 26       "blockages"  . Alzheimer's disease Mother   . Diverticulitis Father   . Hernia Father   . Depression Father   . Alzheimer's disease Father   . Hypothyroidism Sister   . Uterine cancer Sister 20  . Breast cancer Maternal Aunt 60  . Hypothyroidism Daughter   . Hypertension Brother   . Stroke Maternal Grandfather   . Heart attack Maternal Grandfather   . Stroke Paternal Grandmother   . Hypothyroidism Sister   . Hypothyroidism Daughter   . Ovarian cancer Paternal Aunt 54   Social History  Substance Use Topics  . Smoking status: Never Smoker  . Smokeless tobacco: Never Used  . Alcohol use No   Review of Systems  Objective:   Vitals:   01/15/17 0955  BP: 124/80  Pulse: 94  Resp: 14  Temp: 98 F (36.7 C)  TempSrc: Oral  SpO2: 97%  Weight: 218 lb 6.4 oz (99.1 kg)  Height: 5' 6.5" (1.689 m)   Body mass index is 34.72 kg/m. Wt Readings from Last 3 Encounters:  01/15/17 218 lb 6.4 oz (99.1 kg)  11/02/16 217 lb (98.4 kg)  10/23/16 219 lb (99.3 kg)   Physical Exam  Constitutional: She appears well-developed and well-nourished.  HENT:  Head: Normocephalic and atraumatic.  Right Ear: Hearing, tympanic membrane, external ear and ear canal normal.   Left Ear: Hearing, tympanic membrane, external ear and ear canal normal.  Eyes: Conjunctivae and EOM are normal. Right eye exhibits no hordeolum. Left eye exhibits no hordeolum. No scleral icterus.  Neck: Carotid bruit is not present. No thyromegaly present.  Cardiovascular: Normal rate, regular rhythm, S1 normal, S2 normal and normal heart sounds.   No extrasystoles are present.  Pulmonary/Chest: Effort normal and breath sounds normal. No respiratory distress.  Abdominal: Soft. Normal  appearance and bowel sounds are normal. She exhibits no distension, no abdominal bruit, no pulsatile midline mass and no mass. There is no hepatosplenomegaly. There is no tenderness. No hernia.  Musculoskeletal: Normal range of motion. She exhibits no edema.  Lymphadenopathy:       Head (right side): No submandibular adenopathy present.       Head (left side): No submandibular adenopathy present.    She has no cervical adenopathy.  Neurological: She is alert. She displays no tremor. No cranial nerve deficit. She exhibits normal muscle tone. Gait normal.  Reflex Scores:      Patellar reflexes are 2+ on the right side and 2+ on the left side. Skin: Skin is warm and dry. No bruising and no ecchymosis noted. No cyanosis. No pallor.  Psychiatric: Her speech is normal and behavior is normal. Thought content normal. Her mood appears not anxious. She does not exhibit a depressed mood.    Assessment/Plan:   Problem List Items Addressed This Visit      Endocrine   IFG (impaired fasting glucose)    Refer for nutritional teaching; weight loss will be key to prevent diabetes; managed by Dr. Gabriel Carina, endocrinologist      Relevant Orders   Amb ref to Medical Nutrition Therapy-MNT     Other   Preventative health care - Primary    USPSTF grade A and B recommendations reviewed with patient; age-appropriate recommendations, preventive care, screening tests, etc discussed and encouraged; healthy living encouraged; see AVS  for patient education given to patient       Obesity    Refer for nutritional education; first step for her is to get her weight under 200 pounds; encouragement given; see AVS      Relevant Orders   Amb ref to Medical Nutrition Therapy-MNT    Other Visit Diagnoses    Screen for colon cancer       Relevant Orders   Ambulatory referral to Gastroenterology       Meds ordered this encounter  Medications  . cholecalciferol (VITAMIN D) 1000 units tablet    Sig: Take 1,000 Units by mouth daily.   Orders Placed This Encounter  Procedures  . Ambulatory referral to Gastroenterology    Referral Priority:   Routine    Referral Type:   Consultation    Referral Reason:   Specialty Services Required    Number of Visits Requested:   1  . Amb ref to Medical Nutrition Therapy-MNT    Referral Priority:   Routine    Referral Type:   Consultation    Referral Reason:   Specialty Services Required    Requested Specialty:   Nutrition    Number of Visits Requested:   1    Follow up plan: Return in about 1 year (around 01/15/2018) for complete physical.  An After Visit Summary was printed and given to the patient.

## 2017-01-15 NOTE — Assessment & Plan Note (Signed)
Refer for nutritional teaching; weight loss will be key to prevent diabetes; managed by Dr. Gabriel Carina, endocrinologist

## 2017-01-15 NOTE — Assessment & Plan Note (Signed)
USPSTF grade A and B recommendations reviewed with patient; age-appropriate recommendations, preventive care, screening tests, etc discussed and encouraged; healthy living encouraged; see AVS for patient education given to patient  

## 2017-01-15 NOTE — Patient Instructions (Addendum)
Please contact your gynecologist about getting a mammogram We'll refer you for a screening colonoscopy Talk to you gynecologist about family history of breast and ovarian cancer to see if BRCA or other genetic testing is appropriate Consider the new shingles vaccines called Shingrix and you can get that at your pharmacy or here when stocks are available  Check out the information at familydoctor.org entitled "Nutrition for Weight Loss: What You Need to Know about Fad Diets" Try to lose between 1-2 pounds per week by taking in fewer calories and burning off more calories You can succeed by limiting portions, limiting foods dense in calories and fat, becoming more active, and drinking 8 glasses of water a day (64 ounces) Don't skip meals, especially breakfast, as skipping meals may alter your metabolism Do not use over-the-counter weight loss pills or gimmicks that claim rapid weight loss A healthy BMI (or body mass index) is between 18.5 and 24.9 You can calculate your ideal BMI at the [employer] website [employer]fr  Health Maintenance, Female Adopting a healthy lifestyle and getting preventive care can go a long way to promote health and wellness. Talk with your health care provider about what schedule of regular examinations is right for you. This is a good chance for you to check in with your provider about disease prevention and staying healthy. In between checkups, there are plenty of things you can do on your own. Experts have done a lot of research about which lifestyle changes and preventive measures are most likely to keep you healthy. Ask your health care provider for more information. Weight and diet Eat a healthy diet  Be sure to include plenty of vegetables, fruits, low-fat dairy products, and lean protein.  Do not eat a lot of foods high in solid fats, added sugars, or salt.  Get regular exercise. This is one of the most important  things you can do for your health. ? Most adults should exercise for at least 150 minutes each week. The exercise should increase your heart rate and make you sweat (moderate-intensity exercise). ? Most adults should also do strengthening exercises at least twice a week. This is in addition to the moderate-intensity exercise.  Maintain a healthy weight  Body mass index (BMI) is a measurement that can be used to identify possible weight problems. It estimates body fat based on height and weight. Your health care provider can help determine your BMI and help you achieve or maintain a healthy weight.  For females 64 years of age and older: ? A BMI below 18.5 is considered underweight. ? A BMI of 18.5 to 24.9 is normal. ? A BMI of 25 to 29.9 is considered overweight. ? A BMI of 30 and above is considered obese.  Watch levels of cholesterol and blood lipids  You should start having your blood tested for lipids and cholesterol at 57 years of age, then have this test every 5 years.  You may need to have your cholesterol levels checked more often if: ? Your lipid or cholesterol levels are high. ? You are older than 57 years of age. ? You are at high risk for heart disease.  Cancer screening Lung Cancer  Lung cancer screening is recommended for adults 58-66 years old who are at high risk for lung cancer because of a history of smoking.  A yearly low-dose CT scan of the lungs is recommended for people who: ? Currently smoke. ? Have quit within the past 15 years. ? Have at least a  30-pack-year history of smoking. A pack year is smoking an average of one pack of cigarettes a day for 1 year.  Yearly screening should continue until it has been 15 years since you quit.  Yearly screening should stop if you develop a health problem that would prevent you from having lung cancer treatment.  Breast Cancer  Practice breast self-awareness. This means understanding how your breasts normally appear  and feel.  It also means doing regular breast self-exams. Let your health care provider know about any changes, no matter how small.  If you are in your 20s or 30s, you should have a clinical breast exam (CBE) by a health care provider every 1-3 years as part of a regular health exam.  If you are 60 or older, have a CBE every year. Also consider having a breast X-ray (mammogram) every year.  If you have a family history of breast cancer, talk to your health care provider about genetic screening.  If you are at high risk for breast cancer, talk to your health care provider about having an MRI and a mammogram every year.  Breast cancer gene (BRCA) assessment is recommended for women who have family members with BRCA-related cancers. BRCA-related cancers include: ? Breast. ? Ovarian. ? Tubal. ? Peritoneal cancers.  Results of the assessment will determine the need for genetic counseling and BRCA1 and BRCA2 testing.  Cervical Cancer Your health care provider may recommend that you be screened regularly for cancer of the pelvic organs (ovaries, uterus, and vagina). This screening involves a pelvic examination, including checking for microscopic changes to the surface of your cervix (Pap test). You may be encouraged to have this screening done every 3 years, beginning at age 63.  For women ages 27-65, health care providers may recommend pelvic exams and Pap testing every 3 years, or they may recommend the Pap and pelvic exam, combined with testing for human papilloma virus (HPV), every 5 years. Some types of HPV increase your risk of cervical cancer. Testing for HPV may also be done on women of any age with unclear Pap test results.  Other health care providers may not recommend any screening for nonpregnant women who are considered low risk for pelvic cancer and who do not have symptoms. Ask your health care provider if a screening pelvic exam is right for you.  If you have had past treatment  for cervical cancer or a condition that could lead to cancer, you need Pap tests and screening for cancer for at least 20 years after your treatment. If Pap tests have been discontinued, your risk factors (such as having a new sexual partner) need to be reassessed to determine if screening should resume. Some women have medical problems that increase the chance of getting cervical cancer. In these cases, your health care provider may recommend more frequent screening and Pap tests.  Colorectal Cancer  This type of cancer can be detected and often prevented.  Routine colorectal cancer screening usually begins at 57 years of age and continues through 57 years of age.  Your health care provider may recommend screening at an earlier age if you have risk factors for colon cancer.  Your health care provider may also recommend using home test kits to check for hidden blood in the stool.  A small camera at the end of a tube can be used to examine your colon directly (sigmoidoscopy or colonoscopy). This is done to check for the earliest forms of colorectal cancer.  Routine screening  usually begins at age 51.  Direct examination of the colon should be repeated every 5-10 years through 57 years of age. However, you may need to be screened more often if early forms of precancerous polyps or small growths are found.  Skin Cancer  Check your skin from head to toe regularly.  Tell your health care provider about any new moles or changes in moles, especially if there is a change in a mole's shape or color.  Also tell your health care provider if you have a mole that is larger than the size of a pencil eraser.  Always use sunscreen. Apply sunscreen liberally and repeatedly throughout the day.  Protect yourself by wearing long sleeves, pants, a wide-brimmed hat, and sunglasses whenever you are outside.  Heart disease, diabetes, and high blood pressure  High blood pressure causes heart disease and  increases the risk of stroke. High blood pressure is more likely to develop in: ? People who have blood pressure in the high end of the normal range (130-139/85-89 mm Hg). ? People who are overweight or obese. ? People who are African American.  If you are 44-69 years of age, have your blood pressure checked every 3-5 years. If you are 70 years of age or older, have your blood pressure checked every year. You should have your blood pressure measured twice-once when you are at a hospital or clinic, and once when you are not at a hospital or clinic. Record the average of the two measurements. To check your blood pressure when you are not at a hospital or clinic, you can use: ? An automated blood pressure machine at a pharmacy. ? A home blood pressure monitor.  If you are between 23 years and 74 years old, ask your health care provider if you should take aspirin to prevent strokes.  Have regular diabetes screenings. This involves taking a blood sample to check your fasting blood sugar level. ? If you are at a normal weight and have a low risk for diabetes, have this test once every three years after 57 years of age. ? If you are overweight and have a high risk for diabetes, consider being tested at a younger age or more often. Preventing infection Hepatitis B  If you have a higher risk for hepatitis B, you should be screened for this virus. You are considered at high risk for hepatitis B if: ? You were born in a country where hepatitis B is common. Ask your health care provider which countries are considered high risk. ? Your parents were born in a high-risk country, and you have not been immunized against hepatitis B (hepatitis B vaccine). ? You have HIV or AIDS. ? You use needles to inject street drugs. ? You live with someone who has hepatitis B. ? You have had sex with someone who has hepatitis B. ? You get hemodialysis treatment. ? You take certain medicines for conditions, including  cancer, organ transplantation, and autoimmune conditions.  Hepatitis C  Blood testing is recommended for: ? Everyone born from [birth year] through 1966. ? Anyone with known risk factors for hepatitis C.  Sexually transmitted infections (STIs)  You should be screened for sexually transmitted infections (STIs) including gonorrhea and chlamydia if: ? You are sexually active and are younger than 56 years of age. ? You are older than 57 years of age and your health care provider tells you that you are at risk for this type of infection. ? Your sexual activity has changed  since you were last screened and you are at an increased risk for chlamydia or gonorrhea. Ask your health care provider if you are at risk.  If you do not have HIV, but are at risk, it may be recommended that you take a prescription medicine daily to prevent HIV infection. This is called pre-exposure prophylaxis (PrEP). You are considered at risk if: ? You are sexually active and do not regularly use condoms or know the HIV status of your partner(s). ? You take drugs by injection. ? You are sexually active with a partner who has HIV.  Talk with your health care provider about whether you are at high risk of being infected with HIV. If you choose to begin PrEP, you should first be tested for HIV. You should then be tested every 3 months for as long as you are taking PrEP. Pregnancy  If you are premenopausal and you may become pregnant, ask your health care provider about preconception counseling.  If you may become pregnant, take 400 to 800 micrograms (mcg) of folic acid every day.  If you want to prevent pregnancy, talk to your health care provider about birth control (contraception). Osteoporosis and menopause  Osteoporosis is a disease in which the bones lose minerals and strength with aging. This can result in serious bone fractures. Your risk for osteoporosis can be identified using a bone density scan.  If you are 14 years  of age or older, or if you are at risk for osteoporosis and fractures, ask your health care provider if you should be screened.  Ask your health care provider whether you should take a calcium or vitamin D supplement to lower your risk for osteoporosis.  Menopause may have certain physical symptoms and risks.  Hormone replacement therapy may reduce some of these symptoms and risks. Talk to your health care provider about whether hormone replacement therapy is right for you. Follow these instructions at home:  Schedule regular health, dental, and eye exams.  Stay current with your immunizations.  Do not use any tobacco products including cigarettes, chewing tobacco, or electronic cigarettes.  If you are pregnant, do not drink alcohol.  If you are breastfeeding, limit how much and how often you drink alcohol.  Limit alcohol intake to no more than 1 drink per day for nonpregnant women. One drink equals 12 ounces of beer, 5 ounces of wine, or 1 ounces of hard liquor.  Do not use street drugs.  Do not share needles.  Ask your health care provider for help if you need support or information about quitting drugs.  Tell your health care provider if you often feel depressed.  Tell your health care provider if you have ever been abused or do not feel safe at home. This information is not intended to replace advice given to you by your health care provider. Make sure you discuss any questions you have with your health care provider. Document Released: 09/15/2010 Document Revised: 08/08/2015 Document Reviewed: 12/04/2014 Elsevier Interactive Patient Education  [employer].

## 2017-01-15 NOTE — Assessment & Plan Note (Addendum)
Refer for nutritional education; first step for her is to get her weight under 200 pounds; encouragement given; see AVS

## 2017-01-26 ENCOUNTER — Ambulatory Visit: Payer: Commercial Managed Care - PPO | Admitting: Podiatry

## 2017-03-02 ENCOUNTER — Other Ambulatory Visit: Payer: Self-pay | Admitting: Podiatry

## 2017-03-02 ENCOUNTER — Ambulatory Visit: Payer: Commercial Managed Care - PPO | Admitting: Podiatry

## 2017-03-02 ENCOUNTER — Ambulatory Visit (INDEPENDENT_AMBULATORY_CARE_PROVIDER_SITE_OTHER): Payer: Commercial Managed Care - PPO

## 2017-03-02 DIAGNOSIS — M7731 Calcaneal spur, right foot: Secondary | ICD-10-CM

## 2017-03-02 DIAGNOSIS — M722 Plantar fascial fibromatosis: Secondary | ICD-10-CM

## 2017-03-02 DIAGNOSIS — M79672 Pain in left foot: Secondary | ICD-10-CM

## 2017-03-03 MED ORDER — MELOXICAM 15 MG PO TABS: 15.0000 mg | ORAL_TABLET | Freq: Every day | ORAL | 3 refills | Status: DC

## 2017-03-04 NOTE — Progress Notes (Signed)
   Subjective: 56 year old female presents today for constant aching and throbbing pain and tenderness in the bilateral heels and feet.  She states the pain in the left foot radiates up her leg.  She reports associated cramping of the left lower extremity.  She has been wearing orthotics but states they are no longer helping. Patient presents today for further treatment and evaluation.    Past Medical History:  Diagnosis Date  . Anemia    H/O  . Anxiety   . Complication of anesthesia   . Family history of breast cancer    cancer genetic testing letter sent 8/18  . Family history of ovarian cancer    cancer genetic testing letter sent 8/18  . GERD (gastroesophageal reflux disease)    NO MEDS  . Hyperlipidemia   . Hypertension    NO MEDS CURRENTLY-OFF MEDS SINCE 2015  . Hypothyroidism    Hashimoto's per patient  . IFG (impaired fasting glucose) 08/05/2015  . Metabolic syndrome 1/61/0960  . Palpitations 2012   Evaluated by Ida Rogue, MD, Holter moniter  . PONV (postoperative nausea and vomiting)   . Rectal mass   . Thyroid disease    unspecified hypothyroidism     Objective: Physical Exam General: The patient is alert and oriented x3 in no acute distress.  Dermatology: Skin is warm, dry and supple bilateral lower extremities. Negative for open lesions or macerations bilateral.   Vascular: Dorsalis Pedis and Posterior Tibial pulses palpable bilateral.  Capillary fill time is immediate to all digits.  Neurological: Epicritic and protective threshold intact bilateral.   Musculoskeletal: Tenderness to palpation at the medial calcaneal tubercale and through the insertion of the plantar fascia of the right foot. All other joints range of motion within normal limits bilateral. Strength 5/5 in all groups bilateral.   Radiographic exam: Normal osseous mineralization. Joint spaces preserved. No fracture/dislocation/boney destruction. Calcaneal spur present with mild thickening  of plantar fascia right. No other soft tissue abnormalities or radiopaque foreign bodies.   Assessment: 1. Plantar fasciitis right 2.  Prominent orthopedic screws - Orthohelix 4.0 screws to metatarsal cuneiform joint left foot as per operative note dated 10/20/13 3.  Painful hardware left lower extremity- 7.0 mm Orthohelix subtalar screws x 2.  4.0 mm Orthohelix Lapidus screws x 2.  Plan of Care:  1. Patient evaluated. Xrays reviewed.   2. Injection of 0.5cc Celestone soluspan injected into the right plantar fascia  3.  Plantar fascial brace dispensed. 4.  Appointment with Liliane Channel for custom molded orthotics. 5.  Return to clinic in 4 weeks.   Edrick Kins, DPM [facility]  Dr. Edrick Kins, DPM    [address], [place] [address]                Office [phone]  Fax [phone]

## 2017-03-05 ENCOUNTER — Ambulatory Visit: Payer: Commercial Managed Care - PPO | Admitting: Podiatry

## 2017-03-10 ENCOUNTER — Other Ambulatory Visit: Payer: Commercial Managed Care - PPO | Admitting: Orthotics

## 2017-03-26 ENCOUNTER — Ambulatory Visit: Payer: Commercial Managed Care - PPO | Admitting: Podiatry

## 2017-04-16 ENCOUNTER — Ambulatory Visit: Payer: Commercial Managed Care - PPO | Admitting: Podiatry

## 2017-04-16 ENCOUNTER — Encounter: Payer: Self-pay | Admitting: Podiatry

## 2017-04-16 ENCOUNTER — Ambulatory Visit (INDEPENDENT_AMBULATORY_CARE_PROVIDER_SITE_OTHER): Payer: Commercial Managed Care - PPO | Admitting: Podiatry

## 2017-04-16 DIAGNOSIS — M2042 Other hammer toe(s) (acquired), left foot: Secondary | ICD-10-CM | POA: Diagnosis not present

## 2017-04-16 DIAGNOSIS — M79672 Pain in left foot: Secondary | ICD-10-CM

## 2017-04-16 DIAGNOSIS — M659 Synovitis and tenosynovitis, unspecified: Secondary | ICD-10-CM | POA: Diagnosis not present

## 2017-04-16 DIAGNOSIS — T85848D Pain due to other internal prosthetic devices, implants and grafts, subsequent encounter: Secondary | ICD-10-CM

## 2017-04-16 DIAGNOSIS — G5792 Unspecified mononeuropathy of left lower limb: Secondary | ICD-10-CM

## 2017-04-16 DIAGNOSIS — G8929 Other chronic pain: Secondary | ICD-10-CM | POA: Diagnosis not present

## 2017-04-16 DIAGNOSIS — M2041 Other hammer toe(s) (acquired), right foot: Secondary | ICD-10-CM

## 2017-04-19 NOTE — Progress Notes (Signed)
   Subjective: 57 year old female presents today for follow up evaluation of bilateral foot pain. She states the plantar fascial pain of the right foot has worsened. She reports spasms to the left fifth toe. She is interested in having the hardware in the left foot removed. Patient presents today for further treatment and evaluation.    Past Medical History:  Diagnosis Date  . Anemia    H/O  . Anxiety   . Complication of anesthesia   . Family history of breast cancer    cancer genetic testing letter sent 8/18  . Family history of ovarian cancer    cancer genetic testing letter sent 8/18  . GERD (gastroesophageal reflux disease)    NO MEDS  . Hyperlipidemia   . Hypertension    NO MEDS CURRENTLY-OFF MEDS SINCE 2015  . Hypothyroidism    Hashimoto's per patient  . IFG (impaired fasting glucose) 08/05/2015  . Metabolic syndrome 11/30/9148  . Palpitations 2012   Evaluated by Ida Rogue, MD, Holter moniter  . PONV (postoperative nausea and vomiting)   . Rectal mass   . Thyroid disease    unspecified hypothyroidism     Objective: Physical Exam General: The patient is alert and oriented x3 in no acute distress.  Dermatology: Skin is warm, dry and supple bilateral lower extremities. Negative for open lesions or macerations bilateral.   Vascular: Dorsalis Pedis and Posterior Tibial pulses palpable bilateral.  Capillary fill time is immediate to all digits.  Neurological: Epicritic and protective threshold intact bilateral.   Musculoskeletal: Tenderness to palpation at the medial calcaneal tubercale and through the insertion of the plantar fascia of the right foot. Hammertoe contracture deformity noted to the 5th digit of the bilateral feet. All other joints range of motion within normal limits bilateral. Strength 5/5 in all groups bilateral.    Assessment: 1. Plantar fasciitis right 2. Prominent orthopedic screws - Orthohelix 4.0 screws to metatarsal cuneiform joint left foot  as per operative note dated 10/20/13 3. Painful hardware left lower extremity- 7.0 mm Orthohelix subtalar screws x 2.  4.0 mm Orthohelix Lapidus screws x 2. 4. Hammertoe deformity bilateral 5th toes  Plan of Care:  1. Patient evaluated.  2. Continue wearing custom molded orthotics and good, supportive sneakers. 3. Appointment with Liliane Channel for orthotics modifications.  4. Return to clinic as needed.   Edrick Kins, DPM [facility]  Dr. Edrick Kins, DPM    [address], [place] [address]                Office [phone]  Fax [phone]

## 2017-04-20 ENCOUNTER — Telehealth: Payer: Self-pay | Admitting: Family Medicine

## 2017-04-20 NOTE — Telephone Encounter (Signed)
Note received from insurance company They are concerned because patient has a possible history of heart disease and is on meloxicam I looked through her chart I don't see that she ever followed up with her stress test or returned back to see the cardiologist We'll urge her to complete that work-up Use of meloxicam if she has cardiac disease can be dangerous and increases the risk of heart attack

## 2017-04-20 NOTE — Telephone Encounter (Signed)
Left detailed voicemail

## 2017-04-21 ENCOUNTER — Other Ambulatory Visit: Payer: Commercial Managed Care - PPO | Admitting: Orthotics

## 2017-04-28 NOTE — Progress Notes (Signed)
Closing out lab/order note open since:  10/16/15

## 2017-04-29 ENCOUNTER — Telehealth: Payer: Self-pay

## 2017-04-29 NOTE — Telephone Encounter (Signed)
Patient called stating that she is having a lot of pain in her left calf and thigh due to atrophy from previous subtalar surgery.  She denied any calf swelling, only dull ache and throbbing at times, worse at night.  She is taking Meloxicam, but she does not want to depend on taking medications daily for pain.  She would like for you to call her and discuss options for treatment.  Please call her home # [phone] anytime before 3pm.

## 2017-04-30 NOTE — Telephone Encounter (Signed)
Please recommend physical therapy to build strength. -Dr. Amalia Hailey

## 2017-05-05 ENCOUNTER — Other Ambulatory Visit: Payer: Commercial Managed Care - PPO | Admitting: Orthotics

## 2017-05-06 NOTE — Telephone Encounter (Signed)
I spoke with patient and offered to refer to physical therapy per Dr. Amalia Hailey, she stated that she wasn't sure if therapy would help with her pain.  She stated that she wanted to see someone who will find out what is causing her the pain in her calf and thigh.  I offered her another appt with Dr. Amalia Hailey to discuss she declined.  She also declined to see Dr. Milinda Pointer at this time.  She stated that she would think about physical therapy and call me back if she decided to go that route.

## 2017-05-21 ENCOUNTER — Encounter: Payer: Self-pay | Admitting: Family Medicine

## 2017-05-21 ENCOUNTER — Ambulatory Visit: Payer: Commercial Managed Care - PPO | Admitting: Family Medicine

## 2017-05-21 ENCOUNTER — Ambulatory Visit: Payer: Self-pay | Admitting: Family Medicine

## 2017-05-21 ENCOUNTER — Encounter: Payer: Self-pay | Admitting: Emergency Medicine

## 2017-05-21 VITALS — BP 120/76 | HR 96 | Temp 98.2°F | Resp 16 | Ht 67.0 in | Wt 217.5 lb

## 2017-05-21 VITALS — BP 122/76 | HR 96 | Temp 98.0°F | Resp 16 | Wt 217.0 lb

## 2017-05-21 DIAGNOSIS — N329 Bladder disorder, unspecified: Secondary | ICD-10-CM

## 2017-05-21 DIAGNOSIS — N3000 Acute cystitis without hematuria: Secondary | ICD-10-CM

## 2017-05-21 DIAGNOSIS — L301 Dyshidrosis [pompholyx]: Secondary | ICD-10-CM

## 2017-05-21 DIAGNOSIS — R399 Unspecified symptoms and signs involving the genitourinary system: Secondary | ICD-10-CM

## 2017-05-21 LAB — POCT URINALYSIS DIPSTICK
BILIRUBIN UA: NEGATIVE
Blood, UA: NEGATIVE
GLUCOSE UA: NEGATIVE
Ketones, UA: NEGATIVE
Nitrite, UA: NEGATIVE
Spec Grav, UA: 1.015 (ref 1.010–1.025)
Urobilinogen, UA: 0.2 E.U./dL
pH, UA: 5 (ref 5.0–8.0)

## 2017-05-21 MED ORDER — CRISABOROLE 2 % EX OINT: 1.0000 "application " | TOPICAL_OINTMENT | Freq: Two times a day (BID) | CUTANEOUS | 1 refills | Status: DC

## 2017-05-21 MED ORDER — TRIAMCINOLONE ACETONIDE 0.1 % EX OINT: 1.0000 "application " | TOPICAL_OINTMENT | Freq: Two times a day (BID) | CUTANEOUS | 1 refills | Status: DC

## 2017-05-21 MED ORDER — NITROFURANTOIN MONOHYD MACRO 100 MG PO CAPS: 100.0000 mg | ORAL_CAPSULE | Freq: Two times a day (BID) | ORAL | 0 refills | Status: DC

## 2017-05-21 NOTE — Progress Notes (Signed)
BP 122/76 (BP Location: Left Arm, Patient Position: Sitting, Cuff Size: Large)   Pulse 96   Temp 98 F (36.7 C) (Oral)   Resp 16   Wt 217 lb (98.4 kg)   SpO2 99%   BMI 33.99 kg/m    Subjective:    Patient ID: Desiree Walker, female    DOB: [birth year], 57 y.o.   MRN: [id number]  HPI: Desiree Walker is a 57 y.o. female  Chief Complaint  Patient presents with  . UTI SX    Pt states that she is having burning, however the burning is not when urinating "just happens"     HPI Patient is here Just started feeling bad the last hour No energy; no fevers Having RLQ / groin area, twinges of pain; lower right flank pain No hx of kidney stones in family No blood in the urine; urine smells and looks normal Not drinking as much water as she should, drinking more caffeine than water Really thirsty, trying to drink more water Having pain down below, internal twinge of burning Even before she urinates, she feels burning Had cystoscopy and they found a little spot that was "benign" She wants to get it checked out again; was supposed to go back but lost to follow-up She was already here today for another issue, breaking out on her hand; skin dry and latex irritated; using TAC ointment  Depression screen [facility] 2/9 01/15/2017 10/13/2016 12/17/2015 08/05/2015 12/25/2014  Decreased Interest 0 0 0 0 0  Down, Depressed, Hopeless 0 0 1 2 0  PHQ - 2 Score 0 0 1 2 0  Altered sleeping - - - 0 -  Tired, decreased energy - - - 1 -  Change in appetite - - - 0 -  Feeling bad or failure about yourself  - - - 2 -  Trouble concentrating - - - 2 -  Moving slowly or fidgety/restless - - - 0 -  Suicidal thoughts - - - 0 -  PHQ-9 Score - - - 7 -  Difficult doing work/chores - - - Somewhat difficult -    Relevant past medical, surgical, family and social history reviewed Past Medical History:  Diagnosis Date  . Anemia    H/O  . Anxiety   . Complication of anesthesia   . Family history of breast cancer     cancer genetic testing letter sent 8/18  . Family history of ovarian cancer    cancer genetic testing letter sent 8/18  . GERD (gastroesophageal reflux disease)    NO MEDS  . Hyperlipidemia   . Hypertension    NO MEDS CURRENTLY-OFF MEDS SINCE 2015  . Hypothyroidism    Hashimoto's per patient  . IFG (impaired fasting glucose) 08/05/2015  . Metabolic syndrome 6/54/6503  . Palpitations 2012   Evaluated by Ida Rogue, MD, Holter moniter  . PONV (postoperative nausea and vomiting)   . Rectal mass   . Thyroid disease    unspecified hypothyroidism   Past Surgical History:  Procedure Laterality Date  . ANAL FISTULOTOMY N/A 11/30/2014   Procedure: ANAL FISTULOTOMY;  Surgeon: Dia Crawford III, MD;  Location: [facility] ORS;  Service: General;  Laterality: N/A;  . CESAREAN SECTION    . DILATION AND CURETTAGE OF UTERUS    . left foot surgery    . RECTAL EXAM UNDER ANESTHESIA N/A 11/30/2014   Procedure: RECTAL EXAM UNDER ANESTHESIA;  Surgeon: Dia Crawford III, MD;  Location: [facility] ORS;  Service: General;  Laterality:  N/A;   Family History  Problem Relation Age of Onset  . Hypertension Mother   . Heart disease Mother 36       "blockages"  . Alzheimer's disease Mother   . Diverticulitis Father   . Hernia Father   . Depression Father   . Alzheimer's disease Father   . Hypothyroidism Sister   . Uterine cancer Sister 62  . Breast cancer Maternal Aunt 86  . Hypothyroidism Daughter   . Hypertension Brother   . Stroke Maternal Grandfather   . Heart attack Maternal Grandfather   . Stroke Paternal Grandmother   . Hypothyroidism Sister   . Hypothyroidism Daughter   . Ovarian cancer Paternal Aunt 53   Social History   Tobacco Use  . Smoking status: Never Smoker  . Smokeless tobacco: Never Used  Substance Use Topics  . Alcohol use: No    Alcohol/week: 0.0 oz  . Drug use: No    Interim medical history since last visit reviewed. Allergies and medications reviewed  Review of Systems Per  HPI unless specifically indicated above     Objective:    BP 122/76 (BP Location: Left Arm, Patient Position: Sitting, Cuff Size: Large)   Pulse 96   Temp 98 F (36.7 C) (Oral)   Resp 16   Wt 217 lb (98.4 kg)   SpO2 99%   BMI 33.99 kg/m   Wt Readings from Last 3 Encounters:  05/21/17 217 lb (98.4 kg)  05/21/17 217 lb 8 oz (98.7 kg)  01/15/17 218 lb 6.4 oz (99.1 kg)    Physical Exam  Constitutional: She appears well-developed and well-nourished.  HENT:  Mouth/Throat: Mucous membranes are normal.  Eyes: EOM are normal. No scleral icterus.  Cardiovascular: Normal rate and regular rhythm.  Pulmonary/Chest: Effort normal and breath sounds normal.  Abdominal: There is no rebound, no guarding and no CVA tenderness.  Psychiatric: She has a normal mood and affect. Her behavior is normal.       Assessment & Plan:   Problem List Items Addressed This Visit    None    Visit Diagnoses    UTI symptoms    -  Primary   Relevant Medications   nitrofurantoin, macrocrystal-monohydrate, (MACROBID) 100 MG capsule   Other Relevant Orders   POCT urinalysis dipstick (Completed)   Ambulatory referral to Urology   Urine Culture (Completed)   Acute cystitis without hematuria       Relevant Orders   Ambulatory referral to Urology   Urine Culture (Completed)   Lesion of bladder       Relevant Orders   Ambulatory referral to Urology       Follow up plan: No Follow-up on file.  An after-visit summary was printed and given to the patient at [place].  Please see the patient instructions which may contain other information and recommendations beyond what is mentioned above in the assessment and plan.  Meds ordered this encounter  Medications  . nitrofurantoin, macrocrystal-monohydrate, (MACROBID) 100 MG capsule    Sig: Take 1 capsule (100 mg total) by mouth 2 (two) times daily.    Dispense:  14 capsule    Refill:  0    Orders Placed This Encounter  Procedures  . Urine Culture  .  Ambulatory referral to Urology  . POCT urinalysis dipstick

## 2017-05-21 NOTE — Patient Instructions (Addendum)
Dyshidrotic Eczema Dyshidrotic eczema (pompholyx) is a type of eczema that causes very itchy (pruritic), fluid-filled blisters (vesicles) to form on the hands and feet. It can affect people of any age, but is more common before the age of 40. There is no cure, but treatment and certain lifestyle changes can help relieve symptoms. What are the causes? The cause of this condition is not known. What increases the risk? You are more likely to develop this condition if:  You wash your hands frequently.  You have a personal history or family history of eczema, allergies, asthma, or hay fever.  You are allergic to metals such as nickel or cobalt.  You work with cement.  You smoke.  What are the signs or symptoms? Symptoms of this condition may affect the hands, feet, or both. Symptoms may come and go (recur), and may include:  Severe itching, which may happen before blisters appear.  Blisters. These may form suddenly. ? In the early stages, blisters may form near the fingertips. ? In severe cases, blisters may grow to large blister masses (bullae). ? Blisters resolve in 2-3 weeks without bursting. This is followed by a dry phase in which itching eases.  Pain and swelling.  Cracks or long, narrow openings (fissures) in the skin.  Severe dryness.  Ridges on the nails.  How is this diagnosed? This condition may be diagnosed based on:  A physical exam.  Your symptoms.  Your medical history.  Skin scrapings to rule out a fungal infection.  Testing a swab of fluid for bacteria (culture).  Removing and checking a small piece of skin (biopsy) in order to test for infection or to rule out other conditions.  Skin patch tests. These tests involve taking patches that contain possible allergens and placing them on your back. Your health care provider will wait a few days and then check to see if an allergic reaction occurred. These tests may be done if your health care provider suspects  allergic reactions, or to rule out other types of eczema.  You may be referred to a health care provider who specializes in the skin (dermatologist) to help diagnose and treat this condition. How is this treated? There is no cure for this condition, but treatment can help relieve symptoms. Depending on how many blisters you have and how severe they are, your health care provider may suggest:  Avoiding allergens, irritants, or triggers that worsen symptoms. This may involve lifestyle changes such as: ? Using different lotions or soaps. ? Avoiding hot weather or places that will cause you to sweat a lot. ? Managing stress with coping techniques such as relaxation and exercise, and asking for help when you need it. ? Diet changes as recommended by your health care provider.  Using a clean, damp towel (cool compress) to relieve symptoms.  Soaking in a bath that contains a type of salt that relieves irritation (aluminum acetate soaks).  Medicine taken by mouth to reduce itching (oral antihistamines).  Medicine applied to the skin to reduce swelling and irritation (topical corticosteroids).  Medicine that reduces the activity of the body's disease-fighting system (immunosuppressants) to treat inflammation. This may be given in severe cases.  Antibiotic medicines to treat bacterial infection.  Light therapy (phototherapy). This involves shining ultraviolet (UV) light on affected skin in order to reduce itchiness and inflammation.  Follow these instructions at home: Bathing and skin care  Wash skin gently. After bathing or washing your hands, pat your skin dry. Avoid rubbing your   skin.  Remove all jewelry before bathing. If the skin under the jewelry stays wet, blisters may form or get worse.  Apply cool compresses as told by your health care provider: ? Soak a clean towel in cool water. ? Wring out excess water until towel is damp. ? Place the towel over affected skin. Leave the towel on  for 20 minutes at a time, 2-3 times a day.  Use mild soaps, cleansers, and lotions that do not contain dyes, perfumes, or other irritants.  Keep your skin hydrated. To do this: ? Avoid very hot water. Take lukewarm baths or showers. ? Apply moisturizer within three minutes of bathing. This locks in moisture. Medicines  Take and apply over-the-counter and prescription medicines only as told by your health care provider.  If you were prescribed antibiotic medicine, take or apply it as told by your health care provider. Do not stop using the antibiotic even if you start to feel better. General instructions  Identify and avoid triggers and allergens.  Keep fingernails short to avoid breaking open the skin while scratching.  Use waterproof gloves to protect your hands when doing work that keeps your hands wet for a long time.  Wear socks to keep your feet dry.  Do not use any products that contain nicotine or tobacco, such as cigarettes and e-cigarettes. If you need help quitting, ask your health care provider.  Keep all follow-up visits as told by your health care provider. This is important. Contact a health care provider if:  You have symptoms that do not go away.  You have signs of infection, such as: ? Crusting, pus, or a bad smell. ? More redness, swelling, or pain. ? Increased warmth in the affected area. Summary  Dyshidrotic eczema (pompholyx) is a type of eczema that causes very itchy (pruritic), fluid-filled blisters (vesicles) to form on the hands and feet.  The cause of this condition is not known.  There is no cure for this condition, but treatment can help relieve symptoms. Treatment depends on how many blisters you have and how severe they are.  Use mild soaps, cleansers, and lotions that do not contain dyes, perfumes, or other irritants. Keep your skin hydrated. This information is not intended to replace advice given to you by your health care provider. Make sure  you discuss any questions you have with your health care provider. Document Released: 07/16/2016 Document Revised: 07/16/2016 Document Reviewed: 07/16/2016 Elsevier Interactive Patient Education  2018 [employer] Inc[employer]  

## 2017-05-21 NOTE — Patient Instructions (Signed)
Hydrate Rest Start the antibiotics Please do eat yogurt or kimchi or take a probiotic daily for the next month We want to replace the healthy germs in the gut If you notice foul, watery diarrhea in the next two months, schedule an appointment RIGHT AWAY or go to an urgent care or the emergency room if a holiday or over a weekend We'll have you see the urologist

## 2017-05-21 NOTE — Progress Notes (Signed)
Name: Desiree Walker   MRN: [id number]    DOB: 05-11-1960   Date:05/21/2017       Progress Note  Subjective  Chief Complaint  Chief Complaint  Patient presents with  . Rash    hands dry and itching for 2 weeks    HPI  Pt presents with concern for dry and itchy hands for about 2 weeks.  Wears latex gloves 6-7 hours a day, washes her hands very frequently. Tried Lubriderm yesterday and it burned; she tried triamcinolone cream and this did not bother her and provided some relief.  States lesions were blister-like at first, and are now dry itchy patches.   Patient Active Problem List   Diagnosis Date Noted  . Preventative health care 01/15/2017  . History of uterine fibroid 10/23/2016  . Metabolic syndrome 61/60/7371  . Medication monitoring encounter 10/13/2016  . Pressure in chest 12/17/2015  . Anxiety disorder 12/17/2015  . Obesity 12/17/2015  . Snoring 08/27/2015  . Vitamin D deficiency 08/19/2015  . Leg cramps 08/05/2015  . IFG (impaired fasting glucose) 08/05/2015  . Anal fistula   . 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 Bladder neoplasm of uncertain malignant potential 06/09/2013  . Female genuine stress incontinence 06/05/2013  . Incomplete bladder emptying 06/05/2013  . Urge incontinence 06/05/2013  . Right lower quadrant pain 07/27/2012  . Palpitations 09/11/2010  . Hypothyroidism, adult 05/10/2009  . Hyperlipidemia LDL goal <130 05/10/2009  . Hypertension goal BP (blood pressure) < 140/90 05/10/2009    Social History   Tobacco Use  . Smoking status: Never Smoker  . Smokeless tobacco: Never Used  Substance Use Topics  . Alcohol use: No    Alcohol/week: 0.0 oz     Current Outpatient Medications:  .  aspirin EC 81 MG tablet, Take 1 tablet (81 mg total) by mouth daily., Disp: , Rfl:  .  cholecalciferol (VITAMIN D) 1000 units tablet, Take 1,000 Units by mouth daily., Disp: , Rfl:  .  levothyroxine (SYNTHROID) 112 MCG tablet, TAKE ONE TABLET  DAILY. EVERY SUNDAY TAKE AN EXTRA ONE-HALF TABLET., Disp: , Rfl:  .  losartan (COZAAR) 50 MG tablet, Take by mouth., Disp: , Rfl:  .  meloxicam (MOBIC) 15 MG tablet, Take 1 tablet (15 mg total) by mouth daily. (Patient not taking: Reported on 05/21/2017), Disp: 30 tablet, Rfl: 3 .  NONFORMULARY OR COMPOUNDED ITEM, See pharmacy note (Patient not taking: Reported on 10/13/2016), Disp: 120 each, Rfl: 2 .  sertraline (ZOLOFT) 50 MG tablet, One-half of a pill by mouth daily x 6 days, then one whole pill daily (Patient not taking: Reported on 01/15/2017), Disp: 30 tablet, Rfl: 0 .  terbinafine (LAMISIL) 250 MG tablet, Take 1 tablet (250 mg total) by mouth daily. (Patient not taking: Reported on 10/13/2016), Disp: 30 tablet, Rfl: 2  Allergies  Allergen Reactions  . Scopolamine Other (See Comments)    "[place]"    ROS  Constitutional: Negative for fever or weight change.  Respiratory: Negative for cough and shortness of breath.   Cardiovascular: Negative for chest pain or palpitations.  Gastrointestinal: Negative for abdominal pain, no bowel changes.  Musculoskeletal: Negative for gait problem or joint swelling.  Skin: Negative for rash.  Neurological: Negative for dizziness or headache.  No other specific complaints in a complete review of systems (except as listed in HPI above).  Objective  Vitals:   05/21/17 1029  BP: 120/76  Pulse: 96  Resp: 16  Temp: 98.2 F (  36.8 C)  TempSrc: Oral  SpO2: 95%  Weight: 217 lb 8 oz (98.7 kg)  Height: 5\' 7"  (1.702 m)   Body mass index is 34.07 kg/m.  Nursing Note and Vital Signs reviewed.  Physical Exam  Constitutional: Patient appears well-developed and well-nourished. Obese. No distress.  HEENT: head atraumatic, normocephalic Cardiovascular: Normal rate, regular rhythm, S1/S2 present.  No murmur or rub heard. No BLE edema. Pulmonary/Chest: Effort normal and breath sounds clear. No respiratory distress or retractions. Skin:  Dry scaling patches to posterior hands bilaterally, minimal excoriation, mild erythema present.  Psychiatric: Patient has a normal mood and affect. behavior is normal. Judgment and thought content normal.  No results found for this or any previous visit (from the past 72 hour(s)).  Assessment & Plan  1. Dyshidrotic eczema - Advised to change gloves to non-latex - note to be provided for workplace today.  - triamcinolone ointment (KENALOG) 0.1 %; Apply 1 application topically 2 (two) times daily.  Dispense: 80 g; Refill: 1 - Crisaborole (EUCRISA) 2 % OINT; Apply 1 application topically 2 (two) times daily.  Dispense: 60 g; Refill: 1  -Red flags and when to present for emergency care or RTC including fever >101.73F, chest pain, shortness of breath, new/worsening/un-resolving symptoms, signs and symptoms of skin infection, reviewed with patient at time of visit. Follow up and care instructions discussed and provided in AVS.

## 2017-05-22 LAB — URINE CULTURE
MICRO NUMBER:: 90301301
RESULT: NO GROWTH
SPECIMEN QUALITY: ADEQUATE

## 2017-06-10 ENCOUNTER — Encounter: Payer: Self-pay | Admitting: Family Medicine

## 2017-06-10 ENCOUNTER — Ambulatory Visit: Payer: Commercial Managed Care - PPO | Admitting: Family Medicine

## 2017-06-10 VITALS — BP 132/80 | HR 89 | Temp 97.4°F | Ht 67.0 in | Wt 217.8 lb

## 2017-06-10 DIAGNOSIS — Z6834 Body mass index (BMI) 34.0-34.9, adult: Secondary | ICD-10-CM | POA: Diagnosis not present

## 2017-06-10 DIAGNOSIS — I10 Essential (primary) hypertension: Secondary | ICD-10-CM

## 2017-06-10 DIAGNOSIS — Z5181 Encounter for therapeutic drug level monitoring: Secondary | ICD-10-CM | POA: Diagnosis not present

## 2017-06-10 DIAGNOSIS — R7301 Impaired fasting glucose: Secondary | ICD-10-CM | POA: Diagnosis not present

## 2017-06-10 DIAGNOSIS — E039 Hypothyroidism, unspecified: Secondary | ICD-10-CM

## 2017-06-10 DIAGNOSIS — E6609 Other obesity due to excess calories: Secondary | ICD-10-CM

## 2017-06-10 DIAGNOSIS — E041 Nontoxic single thyroid nodule: Secondary | ICD-10-CM

## 2017-06-10 DIAGNOSIS — E559 Vitamin D deficiency, unspecified: Secondary | ICD-10-CM | POA: Diagnosis not present

## 2017-06-10 DIAGNOSIS — L308 Other specified dermatitis: Secondary | ICD-10-CM | POA: Diagnosis not present

## 2017-06-10 DIAGNOSIS — H1013 Acute atopic conjunctivitis, bilateral: Secondary | ICD-10-CM | POA: Diagnosis not present

## 2017-06-10 DIAGNOSIS — E063 Autoimmune thyroiditis: Secondary | ICD-10-CM

## 2017-06-10 DIAGNOSIS — E785 Hyperlipidemia, unspecified: Secondary | ICD-10-CM | POA: Diagnosis not present

## 2017-06-10 DIAGNOSIS — L309 Dermatitis, unspecified: Secondary | ICD-10-CM | POA: Insufficient documentation

## 2017-06-10 MED ORDER — VITAMIN D (ERGOCALCIFEROL) 1.25 MG (50000 UNIT) PO CAPS: 50000.0000 [IU] | ORAL_CAPSULE | ORAL | 0 refills | Status: DC

## 2017-06-10 MED ORDER — LOSARTAN POTASSIUM 50 MG PO TABS: 75.0000 mg | ORAL_TABLET | Freq: Every day | ORAL | 1 refills | Status: DC

## 2017-06-10 MED ORDER — CLOBETASOL PROPIONATE 0.05 % EX CREA: 1.0000 "application " | TOPICAL_CREAM | Freq: Two times a day (BID) | CUTANEOUS | 0 refills | Status: DC

## 2017-06-10 NOTE — Assessment & Plan Note (Signed)
Check labs in late April

## 2017-06-10 NOTE — Assessment & Plan Note (Signed)
Continue the 75 mg losartan, refill given today

## 2017-06-10 NOTE — Assessment & Plan Note (Signed)
Check lipids in late April

## 2017-06-10 NOTE — Assessment & Plan Note (Signed)
Will be checking TSH and adjust med if needed in April/May

## 2017-06-10 NOTE — Patient Instructions (Addendum)
Try the stronger steroid cream for two weeks, then back down to the triamcinolone or eucrisa Pat dry Avoid soap when able Start the vitamin D prescription, once a week for four weeks, then contact us for the once a month prescription You are due in April for your thyroid check   Eczema Eczema is a broad term for a group of skin conditions that cause skin to become rough and inflamed. Each type of eczema has different triggers, symptoms, and treatments. Eczema of any type is usually itchy and symptoms range from mild to severe. Eczema and its symptoms are not spread from person to person (are not contagious). It can appear on different parts of the body at different times. Your eczema may not look the same as someone else's eczema. What are the types of eczema? Atopic dermatitis This is a long-term (chronic) skin disease that keeps coming back (recurring). Usual symptoms are dry skin and small, solid pimples that may swell and leak fluid (weep). Contact dermatitis This happens when something irritates the skin and causes a rash. The irritation can come from substances that you are allergic to (allergens), such as poison ivy, chemicals, or medicines that were applied to your skin. Dyshidrotic eczema This is a form of eczema on the hands and feet. It shows up as very itchy, fluid-filled blisters. It can affect people of any age, but is more common before age 74. Hand eczema This causes very itchy areas of skin on the palms and sides of the hands and fingers. This type of eczema is common in industrial jobs where you may be exposed to many different types of irritants. Lichen simplex chronicus This type of eczema occurs when a person constantly scratches one area of the body. Repeated scratching of the area leads to thickened skin (lichenification). Lichen simplex chronicus can occur along with other types of eczema. It is more common in adults, but may be seen in children as well. Nummular  eczema This is a common type of eczema. It has no known cause. It typically causes a red, circular, crusty lesion (plaque) that may be itchy. Scratching may become a habit and can cause bleeding. Nummular eczema occurs most often in people of middle-age or older. It most often affects the hands. Seborrheic dermatitis This is a common skin disease that mainly affects the scalp. It may also affect any oily areas of the body, such as the face, sides of nose, eyebrows, ears, eyelids, and chest. It is marked by small scaling and redness of the skin (erythema). This can affect people of all ages. In infants, this condition is known as Chart[employer] certified accountant." Stasis dermatitis This is a common skin disease that usually appears on the legs and feet. It most often occurs in people who have a condition that prevents blood from being pumped through the veins in the legs (chronic venous insufficiency). Stasis dermatitis is a chronic condition that needs long-term management. How is eczema diagnosed? Your health care provider will examine your skin and review your medical history. He or she may also give you skin patch tests. These tests involve taking patches that contain possible allergens and placing them on your back. He or she will then check in a few days to see if an allergic reaction occurred. What are the common treatments? Treatment for eczema is based on the type of eczema you have. Hydrocortisone steroid medicine can relieve itching quickly and help reduce inflammation. This medicine may be prescribed or obtained over-the-counter, depending on  the strength of the medicine that is needed. Follow these instructions at home:  Take over-the-counter and prescription medicines only as told by your health care provider.  Use creams or ointments to moisturize your skin. Do not use lotions.  Learn what triggers or irritates your symptoms. Avoid these things.  Treat symptom flare-ups quickly.  Do not itch your skin.  This can make your rash worse.  Keep all follow-up visits as told by your health care provider. This is important. Where to find more information:  The American Academy of Dermatology: http://jones-macias.info/  The National Eczema Association: www.nationaleczema.org Contact a health care provider if:  You have serious itching, even with treatment.  You regularly scratch your skin until it bleeds.  Your rash looks different than usual.  Your skin is painful, swollen, or more red than usual.  You have a fever. Summary  There are eight general types of eczema. Each type has different triggers.  Eczema of any type causes itching that may range from mild to severe.  Treatment varies based on the type of eczema you have. Hydrocortisone steroid medicine can help with itching and inflammation.  Protecting your skin is the best way to prevent eczema. Use moisturizers and lotions. Avoid triggers and irritants, and treat flare-ups quickly. This information is not intended to replace advice given to you by your health care provider. Make sure you discuss any questions you have with your health care provider. Document Released: 07/16/2016 Document Revised: 07/16/2016 Document Reviewed: 07/16/2016 Elsevier Interactive Patient Education  2018 [employer].

## 2017-06-10 NOTE — Assessment & Plan Note (Signed)
Start vit D Rx weekly for 4 weeks, then once a month

## 2017-06-10 NOTE — Assessment & Plan Note (Signed)
Due for labs; will get those last week of April and see her in early May

## 2017-06-10 NOTE — Assessment & Plan Note (Signed)
Extensor surface of both hands; may be combination of vit D deficiency, allergy to glove component, eczema, irritant from frequent hand-washing; will use stronger corticosteroid for just two weeks; pat hands dry; avoid soap if possible unless dirty or work-related need; can refer to derm I explained if further testing desired (for chemical, allergic contact testing)

## 2017-06-10 NOTE — Assessment & Plan Note (Signed)
Check labs in April

## 2017-06-10 NOTE — Progress Notes (Signed)
BP 132/80 (BP Location: Left Arm, Patient Position: Sitting, Cuff Size: Large)   Pulse 89   Temp (!) 97.4 F (36.3 C) (Oral)   Ht 5\' 7"  (1.702 m)   Wt 217 lb 12.8 oz (98.8 kg)   SpO2 99%   BMI 34.11 kg/m    Subjective:    Patient ID: Desiree Walker, female    DOB: [birth year], 57 y.o.   MRN: [id number]  HPI: Desiree Walker is a 57 y.o. female  Chief Complaint  Patient presents with  . Rash    Pt states that she has a rash she believes may be eczema, she is currently using kenalog and eucrisa     HPI Here for an acute visit Hands were dry She has eczema; has been trying eucrisa, triamcinolone Trying cetaphil In an IV room and constantly washes her hand Was scratching and the rubber gloves (not latex)  Eyes are a little itchy and mattering  Hypothyroidism; managed by  Dr. Gabriel Carina; April 2018 TSH 2.655; she has Hashimoto's hypothyroidism; sometimes she does ultrasounds on her thyroid; no hx of nodules; she would like to transfer management of her thyroid to this office  Low vitamin D; last 3 readings all under 30; no vit D supplementation; not out in the sun  Prediabetes; due for A1c; she'll return for all of her labs in late April and then visit with me in May  Depression screen [facility] 2/9 01/15/2017 10/13/2016 12/17/2015 08/05/2015 12/25/2014  Decreased Interest 0 0 0 0 0  Down, Depressed, Hopeless 0 0 1 2 0  PHQ - 2 Score 0 0 1 2 0  Altered sleeping - - - 0 -  Tired, decreased energy - - - 1 -  Change in appetite - - - 0 -  Feeling bad or failure about yourself  - - - 2 -  Trouble concentrating - - - 2 -  Moving slowly or fidgety/restless - - - 0 -  Suicidal thoughts - - - 0 -  PHQ-9 Score - - - 7 -  Difficult doing work/chores - - - Somewhat difficult -    Relevant past medical, surgical, family and social history reviewed Past Medical History:  Diagnosis Date  . Anemia    H/O  . Anxiety   . Complication of anesthesia   . Family history of breast cancer    cancer genetic testing letter sent 8/18  . Family history of ovarian cancer    cancer genetic testing letter sent 8/18  . GERD (gastroesophageal reflux disease)    NO MEDS  . Hyperlipidemia   . Hypertension    NO MEDS CURRENTLY-OFF MEDS SINCE 2015  . Hypothyroidism    Hashimoto's per patient  . IFG (impaired fasting glucose) 08/05/2015  . Metabolic syndrome 10/08/3662  . Palpitations 2012   Evaluated by Ida Rogue, MD, Holter moniter  . PONV (postoperative nausea and vomiting)   . Rectal mass   . Thyroid disease    unspecified hypothyroidism   Past Surgical History:  Procedure Laterality Date  . ANAL FISTULOTOMY N/A 11/30/2014   Procedure: ANAL FISTULOTOMY;  Surgeon: Dia Crawford III, MD;  Location: [facility] ORS;  Service: General;  Laterality: N/A;  . CESAREAN SECTION    . DILATION AND CURETTAGE OF UTERUS    . left foot surgery    . RECTAL EXAM UNDER ANESTHESIA N/A 11/30/2014   Procedure: RECTAL EXAM UNDER ANESTHESIA;  Surgeon: Dia Crawford III, MD;  Location: [facility] ORS;  Service: General;  Laterality: N/A;   Family History  Problem Relation Age of Onset  . Hypertension Mother   . Heart disease Mother 98       "blockages"  . Alzheimer's disease Mother   . Diverticulitis Father   . Hernia Father   . Depression Father   . Alzheimer's disease Father   . Hypothyroidism Sister   . Uterine cancer Sister 62  . Breast cancer Maternal Aunt 43  . Hypothyroidism Daughter   . Hypertension Brother   . Stroke Maternal Grandfather   . Heart attack Maternal Grandfather   . Stroke Paternal Grandmother   . Hypothyroidism Sister   . Hypothyroidism Daughter   . Ovarian cancer Paternal Aunt 59   Social History   Tobacco Use  . Smoking status: Never Smoker  . Smokeless tobacco: Never Used  Substance Use Topics  . Alcohol use: No    Alcohol/week: 0.0 oz  . Drug use: No    Interim medical history since last visit reviewed. Allergies and medications reviewed  Review of Systems Per HPI  unless specifically indicated above     Objective:    BP 132/80 (BP Location: Left Arm, Patient Position: Sitting, Cuff Size: Large)   Pulse 89   Temp (!) 97.4 F (36.3 C) (Oral)   Ht 5\' 7"  (1.702 m)   Wt 217 lb 12.8 oz (98.8 kg)   SpO2 99%   BMI 34.11 kg/m   Wt Readings from Last 3 Encounters:  06/10/17 217 lb 12.8 oz (98.8 kg)  05/21/17 217 lb (98.4 kg)  05/21/17 217 lb 8 oz (98.7 kg)    Physical Exam  Constitutional: She appears well-developed and well-nourished. No distress.  HENT:  Head: Normocephalic and atraumatic.  Eyes: EOM are normal. No scleral icterus.  Neck: Thyroid mass (equivocal nodule LEFT side lower aspect laterally) present. No thyromegaly present.  Cardiovascular: Normal rate, regular rhythm and normal heart sounds.  No murmur heard. Pulmonary/Chest: Effort normal and breath sounds normal.  Abdominal: She exhibits no distension.  Musculoskeletal: Normal range of motion. She exhibits no edema.  Neurological: She is alert. She exhibits normal muscle tone.  Skin: Skin is warm and dry. Rash (erythematous rash on the dorsa of the hands) noted. She is not diaphoretic. No pallor.  Psychiatric: She has a normal mood and affect. Her behavior is normal. Judgment and thought content normal.       Assessment & Plan:   Problem List Items Addressed This Visit      Cardiovascular and Mediastinum   Hypertension goal BP (blood pressure) < 140/90 - Primary    Continue the 75 mg losartan, refill given today      Relevant Medications   losartan (COZAAR) 50 MG tablet     Endocrine   IFG (impaired fasting glucose)    Due for labs; will get those last week of April and see her in early May      Relevant Orders   Hemoglobin A1c   Hypothyroidism, adult    Check labs in late April      Relevant Orders   TSH   Autoimmune lymphocytic chronic thyroiditis    Check labs in late April      Relevant Orders   US THYROID   TSH     Musculoskeletal and Integument     Eczema    Extensor surface of both hands; may be combination of vit D deficiency, allergy to glove component, eczema, irritant from frequent hand-washing; will use stronger corticosteroid for just  two weeks; pat hands dry; avoid soap if possible unless dirty or work-related need; can refer to derm I explained if further testing desired (for chemical, allergic contact testing)        Other   Vitamin D deficiency    Start vit D Rx weekly for 4 weeks, then once a month      Relevant Orders   VITAMIN D 25 Hydroxy (Vit-D Deficiency, Fractures)   Obesity    Will be checking TSH and adjust med if needed in April/May      Medication monitoring encounter    Check labs in April      Relevant Orders   COMPLETE METABOLIC PANEL WITH GFR   Hyperlipidemia LDL goal <130    Check lipids in late April      Relevant Medications   losartan (COZAAR) 50 MG tablet   Other Relevant Orders   Lipid panel    Other Visit Diagnoses    Thyroid nodule       Relevant Orders   US THYROID   TSH   Allergic conjunctivitis of both eyes           Follow up plan: Return in about 1 month (around 07/13/2017) for labs only, visit with Dr. Sanda Klein second week of May.  An after-visit summary was printed and given to the patient at [facility].  Please see the patient instructions which may contain other information and recommendations beyond what is mentioned above in the assessment and plan.  Meds ordered this encounter  Medications  . losartan (COZAAR) 50 MG tablet    Sig: Take 1.5 tablets (75 mg total) by mouth daily.    Dispense:  135 tablet    Refill:  1  . clobetasol cream (TEMOVATE) 0.05 %    Sig: Apply 1 application topically 2 (two) times daily. For two weeks, then back down to the weaker steroid cream    Dispense:  30 g    Refill:  0  . Vitamin D, Ergocalciferol, (DRISDOL) 50000 units CAPS capsule    Sig: Take 1 capsule (50,000 Units total) by mouth every 7 (seven) days.    Dispense:  4 capsule     Refill:  0    Orders Placed This Encounter  Procedures  . US THYROID  . TSH  . VITAMIN D 25 Hydroxy (Vit-D Deficiency, Fractures)  . Lipid panel  . Hemoglobin A1c  . COMPLETE METABOLIC PANEL WITH GFR

## 2017-06-11 ENCOUNTER — Telehealth: Payer: Self-pay | Admitting: Family Medicine

## 2017-06-11 NOTE — Telephone Encounter (Signed)
Copied from [place] [id number]. Topic: Quick Communication - Office Called Patient >> Jun 11, 2017  2:21 PM Vonna Kotyk, [place] wrote: Reason for CRM: Trying to schedule patient Thyroid US and check patient's availability. If she could give [place] what are good times of availability and location either [place] or [place].

## 2017-06-11 NOTE — Telephone Encounter (Signed)
Scheduled Thyroid US on June 17, 2017 at 3: 15 p.m. At outpatient Imaging on [address]. In [place]. Left detailed voicemail on patient's cell phone.

## 2017-06-11 NOTE — Telephone Encounter (Signed)
Pt is fine with either Desiree Walker or Desiree Walker. She is off all day April 4th and would love to be scheduled that day at either location.

## 2017-06-11 NOTE — Telephone Encounter (Signed)
Attempted to contact pt regarding scheduling of ultrasound; left message on voice mail [phone].

## 2017-06-11 NOTE — Telephone Encounter (Signed)
Attempted to contact pt  Regarding scheduling ultrasound; left message on voice mail [phone].

## 2017-06-15 ENCOUNTER — Ambulatory Visit: Payer: Commercial Managed Care - PPO | Admitting: Podiatry

## 2017-06-16 ENCOUNTER — Encounter: Payer: Self-pay | Admitting: Urology

## 2017-06-16 ENCOUNTER — Ambulatory Visit: Payer: Commercial Managed Care - PPO | Admitting: Urology

## 2017-06-16 VITALS — BP 139/84 | HR 80 | Resp 16 | Ht 67.0 in | Wt 218.9 lb

## 2017-06-16 DIAGNOSIS — R399 Unspecified symptoms and signs involving the genitourinary system: Secondary | ICD-10-CM

## 2017-06-16 LAB — URINALYSIS, COMPLETE
Bilirubin, UA: NEGATIVE
GLUCOSE, UA: NEGATIVE
KETONES UA: NEGATIVE
Nitrite, UA: NEGATIVE
Protein, UA: NEGATIVE
RBC, UA: NEGATIVE
Specific Gravity, UA: 1.01 (ref 1.005–1.030)
UUROB: 0.2 mg/dL (ref 0.2–1.0)
pH, UA: 6 (ref 5.0–7.5)

## 2017-06-17 ENCOUNTER — Other Ambulatory Visit: Payer: Self-pay

## 2017-06-17 ENCOUNTER — Ambulatory Visit
Admission: RE | Admit: 2017-06-17 | Discharge: 2017-06-17 | Disposition: A | Discharge status: Home / Self Care | Payer: Commercial Managed Care - PPO | Source: Ambulatory Visit | Attending: Family Medicine | Admitting: Family Medicine

## 2017-06-17 DIAGNOSIS — E063 Autoimmune thyroiditis: Secondary | ICD-10-CM | POA: Insufficient documentation

## 2017-06-17 DIAGNOSIS — E041 Nontoxic single thyroid nodule: Secondary | ICD-10-CM

## 2017-06-17 MED ORDER — MELOXICAM 15 MG PO TABS: 15.0000 mg | ORAL_TABLET | Freq: Every day | ORAL | 5 refills | Status: DC

## 2017-06-17 NOTE — Telephone Encounter (Signed)
Pharmacy refill request for Meloxicam.  Per Dr. Evans, ok for refill.  Script has been sent to pharmacy 

## 2017-06-20 ENCOUNTER — Encounter: Payer: Self-pay | Admitting: Urology

## 2017-06-20 NOTE — Progress Notes (Signed)
06/16/2017 8:14 AM   [name] 1960/08/30 [id number]  Referring provider: Arnetha Courser, MD [address] [place], [place] [address]  Chief complaint: bladder control  HPI: Desiree Walker is a 57 year old female previously seen by Dr. Jacqlyn Larsen at [facility] for recurrent UTIs.  She had a cystoscopy in 2015 which showed an erythematous area medial to the left ureteral orifice.  She did have a follow-up cystoscopy 3 months later in this area had resolved.  She states she is concerned about this area and that it may have recurred.  She does have urinary frequency, urgency with occasional episodes of urge incontinence.  She has nocturia x1.  She denies dysuria or gross hematuria.  Her recent UTI symptoms include.  A burning internal sensation in her bladder.  Review of records does not show a recent positive urine culture.   PMH: Past Medical History:  Diagnosis Date  . Anemia    H/O  . Anxiety   . Complication of anesthesia   . Family history of breast cancer    cancer genetic testing letter sent 8/18  . Family history of ovarian cancer    cancer genetic testing letter sent 8/18  . GERD (gastroesophageal reflux disease)    NO MEDS  . Hyperlipidemia   . Hypertension    NO MEDS CURRENTLY-OFF MEDS SINCE 2015  . Hypothyroidism    Hashimoto's per patient  . IFG (impaired fasting glucose) 08/05/2015  . Metabolic syndrome 09/16/5007  . Palpitations 2012   Evaluated by Ida Rogue, MD, Holter moniter  . PONV (postoperative nausea and vomiting)   . Rectal mass   . Thyroid disease    unspecified hypothyroidism    Surgical History: Past Surgical History:  Procedure Laterality Date  . ANAL FISTULOTOMY N/A 11/30/2014   Procedure: ANAL FISTULOTOMY;  Surgeon: Dia Crawford III, MD;  Location: [facility] ORS;  Service: General;  Laterality: N/A;  . CESAREAN SECTION    . DILATION AND CURETTAGE OF UTERUS    . left foot surgery    . RECTAL EXAM UNDER ANESTHESIA N/A 11/30/2014   Procedure:  RECTAL EXAM UNDER ANESTHESIA;  Surgeon: Dia Crawford III, MD;  Location: [facility] ORS;  Service: General;  Laterality: N/A;    Home Medications:  Allergies as of 06/16/2017      Reactions   Scopolamine Other (See Comments)   "[place]"      Medication List        Accurate as of 06/16/17 11:59 PM. Always use your most recent med list.          aspirin EC 81 MG tablet Take 1 tablet (81 mg total) by mouth daily.   cholecalciferol 1000 units tablet Commonly known as:  VITAMIN D Take 1,000 Units by mouth daily.   clobetasol cream 0.05 % Commonly known as:  TEMOVATE Apply 1 application topically 2 (two) times daily. For two weeks, then back down to the weaker steroid cream   Crisaborole 2 % Oint Commonly known as:  EUCRISA Apply 1 application topically 2 (two) times daily.   losartan 50 MG tablet Commonly known as:  COZAAR Take 1.5 tablets (75 mg total) by mouth daily.   meloxicam 15 MG tablet Commonly known as:  MOBIC Take 1 tablet (15 mg total) by mouth daily.   SYNTHROID 112 MCG tablet Generic drug:  levothyroxine TAKE ONE TABLET DAILY. EVERY SUNDAY TAKE AN EXTRA ONE-HALF TABLET.   triamcinolone ointment 0.1 % Commonly known as:  KENALOG  Apply 1 application topically 2 (two) times daily.   Vitamin D (Ergocalciferol) 50000 units Caps capsule Commonly known as:  DRISDOL Take 1 capsule (50,000 Units total) by mouth every 7 (seven) days.       Allergies:  Allergies  Allergen Reactions  . Scopolamine Other (See Comments)    "[place]"    Family History: Family History  Problem Relation Age of Onset  . Hypertension Mother   . Heart disease Mother 52       "blockages"  . Alzheimer's disease Mother   . Diverticulitis Father   . Hernia Father   . Depression Father   . Alzheimer's disease Father   . Hypothyroidism Sister   . Uterine cancer Sister 98  . Breast cancer Maternal Aunt 72  . Hypothyroidism Daughter   .  Hypertension Brother   . Stroke Maternal Grandfather   . Heart attack Maternal Grandfather   . Stroke Paternal Grandmother   . Hypothyroidism Sister   . Hypothyroidism Daughter   . Ovarian cancer Paternal Aunt 9    Social History:  reports that she has never smoked. She has never used smokeless tobacco. She reports that she does not drink alcohol or use drugs.  ROS: UROLOGY Frequent Urination?: Yes Hard to postpone urination?: Yes Burning/pain with urination?: No Get up at night to urinate?: Yes Leakage of urine?: Yes Urine stream starts and stops?: No Trouble starting stream?: No Do you have to strain to urinate?: No Blood in urine?: No Urinary tract infection?: Yes Sexually transmitted disease?: No Injury to kidneys or bladder?: No Painful intercourse?: No Weak stream?: No Currently pregnant?: No Vaginal bleeding?: No  Gastrointestinal Nausea?: No Vomiting?: No Indigestion/heartburn?: No Diarrhea?: No Constipation?: No  Constitutional Fever: No Night sweats?: No Weight loss?: No Fatigue?: No  Skin Skin rash/lesions?: Yes Itching?: Yes  Eyes Blurred vision?: No Double vision?: No  Ears/Nose/Throat Sore throat?: No Sinus problems?: Yes  Hematologic/Lymphatic Swollen glands?: No Easy bruising?: No  Cardiovascular Leg swelling?: No Chest pain?: No  Respiratory Cough?: No Shortness of breath?: No  Endocrine Excessive thirst?: Yes  Musculoskeletal Back pain?: No Joint pain?: No  Neurological Headaches?: No Dizziness?: No  Psychologic Depression?: No Anxiety?: No  Physical Exam: BP 139/84   Pulse 80   Resp 16   Ht 5\' 7"  (1.702 m)   Wt 218 lb 14.4 oz (99.3 kg)   SpO2 96%   BMI 34.28 kg/m   Constitutional:  Alert and oriented, No acute distress. HEENT: [place] AT, moist mucus membranes.  Trachea midline, no masses. Cardiovascular: No clubbing, cyanosis, or edema. Respiratory: Normal respiratory effort, no increased work of  breathing. GI: Abdomen is soft, nontender, nondistended, no abdominal masses GU: No CVA tenderness Lymph: No cervical or inguinal lymphadenopathy. Skin: No rashes, bruises or suspicious lesions. Neurologic: Grossly intact, no focal deficits, moving all 4 extremities. Psychiatric: Normal mood and affect.  Laboratory Data: Lab Results  Component Value Date   WBC 4.3 10/13/2016   HGB 13.5 10/13/2016   HCT 41.4 10/13/2016   MCV 81.7 10/13/2016   PLT 215 10/13/2016    Lab Results  Component Value Date   CREATININE 0.87 10/13/2016     Lab Results  Component Value Date   HGBA1C 5.7 (H) 10/13/2016    Urinalysis Dipstick and microscopy showed no significant abnormalities.   Assessment & Plan:   57 year old female with a history of recurrent UTI.  She does have storage related voiding symptoms most likely secondary to  overactive bladder.  She is concerned about her prior cystoscopy findings and would like to have a follow-up cystoscopy.  Will schedule.  She is not interested in any treatment for her lower urinary tract symptoms at this time.    Abbie Sons, [place] [address] [place], [place] [address] [phone]

## 2017-06-23 ENCOUNTER — Other Ambulatory Visit: Payer: Self-pay | Admitting: Obstetrics & Gynecology

## 2017-06-23 DIAGNOSIS — Z1231 Encounter for screening mammogram for malignant neoplasm of breast: Secondary | ICD-10-CM

## 2017-06-29 ENCOUNTER — Encounter: Payer: Commercial Managed Care - PPO | Admitting: Podiatry

## 2017-06-30 ENCOUNTER — Other Ambulatory Visit: Payer: Self-pay | Admitting: Family Medicine

## 2017-06-30 ENCOUNTER — Other Ambulatory Visit: Payer: Commercial Managed Care - PPO | Admitting: Orthotics

## 2017-07-05 ENCOUNTER — Ambulatory Visit
Admission: RE | Admit: 2017-07-05 | Discharge: 2017-07-05 | Disposition: A | Discharge status: Home / Self Care | Payer: Commercial Managed Care - PPO | Source: Ambulatory Visit | Attending: Obstetrics & Gynecology | Admitting: Obstetrics & Gynecology

## 2017-07-05 DIAGNOSIS — Z1231 Encounter for screening mammogram for malignant neoplasm of breast: Secondary | ICD-10-CM

## 2017-07-07 ENCOUNTER — Other Ambulatory Visit: Payer: Commercial Managed Care - PPO | Admitting: Orthotics

## 2017-07-08 ENCOUNTER — Other Ambulatory Visit: Payer: Commercial Managed Care - PPO | Admitting: Urology

## 2017-07-08 ENCOUNTER — Ambulatory Visit: Payer: Commercial Managed Care - PPO | Admitting: Urology

## 2017-07-08 ENCOUNTER — Encounter: Payer: Self-pay | Admitting: Urology

## 2017-07-08 VITALS — BP 158/94 | HR 84 | Temp 98.0°F | Resp 16 | Ht 67.0 in | Wt 219.2 lb

## 2017-07-08 DIAGNOSIS — N3281 Overactive bladder: Secondary | ICD-10-CM

## 2017-07-08 MED ORDER — LIDOCAINE HCL URETHRAL/MUCOSAL 2 % EX GEL
1.0000 "application " | Freq: Once | CUTANEOUS | Status: AC
  Administered 2017-07-08: 1 via URETHRAL

## 2017-07-08 MED ORDER — CIPROFLOXACIN HCL 500 MG PO TABS
500.0000 mg | ORAL_TABLET | Freq: Once | ORAL | Status: AC
  Administered 2017-07-08: 500 mg via ORAL

## 2017-07-08 NOTE — Progress Notes (Signed)
   07/08/17  CC:  Chief Complaint  Patient presents with  . Cysto    HPI: Refer to my previous note dated 06/16/2017.  Blood pressure (!) 158/94, pulse 84, temperature 98 F (36.7 C), temperature source Oral, resp. rate 16, height 5\' 7"  (1.702 m), weight 219 lb 3.2 oz (99.4 kg), SpO2 99 %.   Cystoscopy Procedure Note  Patient identification was confirmed, informed consent was obtained, and patient was prepped using Betadine solution.  Lidocaine jelly was administered per urethral meatus.    Preoperative abx where received prior to procedure.    Procedure: - Flexible cystoscope introduced, without any difficulty.   - Thorough search of the bladder revealed:    normal urethral meatus    normal urothelium    no stones    no ulcers     no tumors    no urethral polyps    no trabeculation -No bladder mucosal abnormalities were identified - Ureteral orifices were normal in position and appearance.  Post-Procedure: - Patient tolerated the procedure well  Assessment/ Plan: Unremarkable cystoscopy.  Follow-up annually or as needed for worsening lower urinary tract symptoms.   Abbie Sons, MD

## 2017-07-09 ENCOUNTER — Encounter: Payer: Commercial Managed Care - PPO | Admitting: Podiatry

## 2017-07-09 LAB — MICROSCOPIC EXAMINATION: RBC, UA: NONE SEEN /hpf (ref 0–2)

## 2017-07-09 LAB — URINALYSIS, COMPLETE
BILIRUBIN UA: NEGATIVE
GLUCOSE, UA: NEGATIVE
KETONES UA: NEGATIVE
NITRITE UA: NEGATIVE
PROTEIN UA: NEGATIVE
RBC, UA: NEGATIVE
SPEC GRAV UA: 1.01 (ref 1.005–1.030)
UUROB: 0.2 mg/dL (ref 0.2–1.0)
pH, UA: 7 (ref 5.0–7.5)

## 2017-07-12 ENCOUNTER — Telehealth: Payer: Self-pay | Admitting: Podiatry

## 2017-07-12 NOTE — Telephone Encounter (Signed)
Pt called saying she called and spoke to someone this morning who told her they would go ahead and get the medical records taken care of for her as she has an appointment in [place] at 10 am tomorrow. Pt stated she got a call from someone in [place] who told her she needed to contact me that I was the only one who could release her records to her. Pt stated she wanted all of her office visit notes and a CD of her x-rays. I told the pt that I'm in [place] and if she could go to the [place] office they could just print her office visit notes and burn her x-rays to a CD. Pt stated she is frustrated because she keeps getting the run around and wants these records to take tomorrow so the doctor has something to go on. I explained that I could print her notes and fax to [facility] but I would not be able to get her the CD of her x-rays. I told her I would Skype the [place] office, which I did Skype Tammy to let them know the situation. Pt stated she could be there by 8 am tomorrow to get the records and sign the form as she has to be on the road by 9 am to make her 10 am appointment since it is an hour away. I apologized to the pt for getting the run around and told her I would let my supervisor and manager know.

## 2017-07-13 ENCOUNTER — Ambulatory Visit: Payer: Commercial Managed Care - PPO | Admitting: Podiatry

## 2017-07-13 ENCOUNTER — Ambulatory Visit: Payer: Commercial Managed Care - PPO | Admitting: Nurse Practitioner

## 2017-07-26 ENCOUNTER — Other Ambulatory Visit: Payer: Self-pay

## 2017-07-26 DIAGNOSIS — E039 Hypothyroidism, unspecified: Secondary | ICD-10-CM

## 2017-07-26 DIAGNOSIS — Z5181 Encounter for therapeutic drug level monitoring: Secondary | ICD-10-CM

## 2017-07-26 DIAGNOSIS — E063 Autoimmune thyroiditis: Secondary | ICD-10-CM

## 2017-07-26 DIAGNOSIS — R7301 Impaired fasting glucose: Secondary | ICD-10-CM

## 2017-07-26 DIAGNOSIS — E559 Vitamin D deficiency, unspecified: Secondary | ICD-10-CM

## 2017-07-26 DIAGNOSIS — E041 Nontoxic single thyroid nodule: Secondary | ICD-10-CM

## 2017-07-26 DIAGNOSIS — E785 Hyperlipidemia, unspecified: Secondary | ICD-10-CM

## 2017-07-27 LAB — COMPLETE METABOLIC PANEL WITH GFR
AG Ratio: 1.7 (calc) (ref 1.0–2.5)
ALT: 12 U/L (ref 6–29)
AST: 16 U/L (ref 10–35)
Albumin: 4.3 g/dL (ref 3.6–5.1)
Alkaline phosphatase (APISO): 82 U/L (ref 33–130)
BILIRUBIN TOTAL: 0.4 mg/dL (ref 0.2–1.2)
BUN: 18 mg/dL (ref 7–25)
CHLORIDE: 104 mmol/L (ref 98–110)
CO2: 27 mmol/L (ref 20–32)
Calcium: 9.5 mg/dL (ref 8.6–10.4)
Creat: 0.8 mg/dL (ref 0.50–1.05)
GFR, Est African American: 94 mL/min/{1.73_m2} (ref >=60)
GFR, Est Non African American: 81 mL/min/{1.73_m2} (ref >=60)
Globulin: 2.5 g/dL (calc) (ref 1.9–3.7)
Glucose, Bld: 103 mg/dL — ABNORMAL HIGH (ref 65–99)
Potassium: 4.3 mmol/L (ref 3.5–5.3)
Sodium: 138 mmol/L (ref 135–146)
TOTAL PROTEIN: 6.8 g/dL (ref 6.1–8.1)

## 2017-07-27 LAB — LIPID PANEL
CHOLESTEROL: 226 mg/dL — AB (ref <200)
HDL: 54 mg/dL (ref >50)
LDL CHOLESTEROL (CALC): 145 mg/dL — AB
Non-HDL Cholesterol (Calc): 172 mg/dL (calc) — ABNORMAL HIGH (ref <130)
TRIGLYCERIDES: 147 mg/dL (ref <150)
Total CHOL/HDL Ratio: 4.2 (calc) (ref <5.0)

## 2017-07-27 LAB — HEMOGLOBIN A1C
HEMOGLOBIN A1C: 5.9 %{Hb} — AB (ref <5.7)
Mean Plasma Glucose: 123 (calc)
eAG (mmol/L): 6.8 (calc)

## 2017-07-27 LAB — VITAMIN D 25 HYDROXY (VIT D DEFICIENCY, FRACTURES): Vit D, 25-Hydroxy: 22 ng/mL — ABNORMAL LOW (ref 30–100)

## 2017-07-27 LAB — TSH: TSH: 1.04 mIU/L (ref 0.40–4.50)

## 2017-07-28 ENCOUNTER — Other Ambulatory Visit: Payer: Self-pay | Admitting: Family Medicine

## 2017-07-28 MED ORDER — VITAMIN D (ERGOCALCIFEROL) 1.25 MG (50000 UNIT) PO CAPS: 50000.0000 [IU] | ORAL_CAPSULE | ORAL | 0 refills | Status: AC

## 2017-07-28 NOTE — Progress Notes (Signed)
Another round of vit D 50k x 4 weeks

## 2017-07-30 ENCOUNTER — Encounter: Payer: Self-pay | Admitting: Family Medicine

## 2017-07-30 ENCOUNTER — Ambulatory Visit (INDEPENDENT_AMBULATORY_CARE_PROVIDER_SITE_OTHER): Payer: Commercial Managed Care - PPO | Admitting: Family Medicine

## 2017-07-30 DIAGNOSIS — L301 Dyshidrosis [pompholyx]: Secondary | ICD-10-CM

## 2017-07-30 DIAGNOSIS — E6609 Other obesity due to excess calories: Secondary | ICD-10-CM

## 2017-07-30 DIAGNOSIS — E063 Autoimmune thyroiditis: Secondary | ICD-10-CM

## 2017-07-30 DIAGNOSIS — Z6834 Body mass index (BMI) 34.0-34.9, adult: Secondary | ICD-10-CM

## 2017-07-30 DIAGNOSIS — I10 Essential (primary) hypertension: Secondary | ICD-10-CM

## 2017-07-30 DIAGNOSIS — E559 Vitamin D deficiency, unspecified: Secondary | ICD-10-CM | POA: Diagnosis not present

## 2017-07-30 MED ORDER — TRIAMCINOLONE ACETONIDE 0.1 % EX OINT: 1.0000 "application " | TOPICAL_OINTMENT | Freq: Two times a day (BID) | CUTANEOUS | 1 refills | Status: DC

## 2017-07-30 MED ORDER — LEVOTHYROXINE SODIUM 112 MCG PO TABS
ORAL_TABLET | ORAL | 3 refills | Status: DC
Start: 2017-07-30 — End: 2018-02-25

## 2017-07-30 NOTE — Patient Instructions (Addendum)
Consider going vegetarian, but don't overcompensate and go heavy on cheese and yogurt and ice cream Limit egg yolks to no more than 3 per week Consider quinoa as a complete protein if you want to skip meat at some meals Check out the information at familydoctor.org entitled "Nutrition for Weight Loss: What You Need to Know about Fad Diets" Try to lose between 1-2 pounds per week by taking in fewer calories and burning off more calories You can succeed by limiting portions, limiting foods dense in calories and fat, becoming more active, and drinking 8 glasses of water a day (64 ounces) Don't skip meals, especially breakfast, as skipping meals may alter your metabolism Do not use over-the-counter weight loss pills or gimmicks that claim rapid weight loss A healthy BMI (or body mass index) is between 18.5 and 24.9 You can calculate your ideal BMI at the [place] website ClubMonetize.fr Think about MyFitnessPal, Fooducate, or Lose It or other apps to try to assist you in your weight loss journey [employer] has some good ideas   Preventing Unhealthy Weight Gain, Adult Staying at a healthy weight is important. When fat builds up in your body, you may become overweight or obese. These conditions put you at greater risk for developing certain health problems, such as heart disease, diabetes, sleeping problems, joint problems, and some cancers. Unhealthy weight gain is often the result of making unhealthy choices in what you eat. It is also a result of not getting enough exercise. You can make changes to your lifestyle to prevent obesity and stay as healthy as possible. What nutrition changes can be made? To maintain a healthy weight and prevent obesity:  Eat only as much as your body needs. To do this: ? Pay attention to signs that you are hungry or full. Stop eating as soon as you feel full. ? If you feel hungry, try drinking water first. Drink  enough water so your urine is clear or pale yellow. ? Eat smaller portions. ? Look at serving sizes on food labels. Most foods contain more than one serving per container. ? Eat the recommended amount of calories for your gender and activity level. While most active people should eat around 2,000 calories per day, if you are trying to lose weight or are not very active, you main need to eat less calories. Talk to your health care provider or dietitian about how many calories you should eat each day.  Choose healthy foods, such as: ? Fruits and vegetables. Try to fill at least half of your plate at each meal with fruits and vegetables. ? Whole grains, such as whole wheat bread, brown rice, and quinoa. ? Lean meats, such as chicken or fish. ? Other healthy proteins, such as beans, eggs, or tofu. ? Healthy fats, such as nuts, seeds, fatty fish, and olive oil. ? Low-fat or fat-free dairy.  Check food labels and avoid food and drinks that: ? Are high in calories. ? Have added sugar. ? Are high in sodium. ? Have saturated fats or trans fats.  Limit how much you eat of the following foods: ? Prepackaged meals. ? Fast food. ? Fried foods. ? Processed meat, such as bacon, sausage, and deli meats. ? Fatty cuts of red meat and poultry with skin.  Cook foods in healthier ways, such as by baking, broiling, or grilling.  When grocery shopping, try to shop around the outside of the store. This helps you buy mostly fresh foods and avoid canned and prepackaged  foods.  What lifestyle changes can be made?  Exercise at least 30 minutes 5 or more days each week. Exercising includes brisk walking, yard work, biking, running, swimming, and team sports like basketball and soccer. Ask your health care provider which exercises are safe for you.  Do not use any products that contain nicotine or tobacco, such as cigarettes and e-cigarettes. If you need help quitting, ask your health care provider.  Limit  alcohol intake to no more than 1 drink a day for nonpregnant women and 2 drinks a day for men. One drink equals 12 oz of beer, 5 oz of wine, or 1 oz of hard liquor.  Try to get 7-9 hours of sleep each night. What other changes can be made?  Keep a food and activity journal to keep track of: ? What you ate and how many calories you had. Remember to count sauces, dressings, and side dishes. ? Whether you were active, and what exercises you did. ? Your calorie, weight, and activity goals.  Check your weight regularly. Track any changes. If you notice you have gained weight, make changes to your diet or activity routine.  Avoid taking weight-loss medicines or supplements. Talk to your health care provider before starting any new medicine or supplement.  Talk to your health care provider before trying any new diet or exercise plan. Why are these changes important? Eating healthy, staying active, and having healthy habits not only help prevent obesity, they also:  Help you to manage stress and emotions.  Help you to connect with friends and family.  Improve your self-esteem.  Improve your sleep.  Prevent long-term health problems.  What can happen if changes are not made? Being obese or overweight can cause you to develop joint or bone problems, which can make it hard for you to stay active or do activities you enjoy. Being obese or overweight also puts stress on your heart and lungs and can lead to health problems like diabetes, heart disease, and some cancers. Where to find more information: Talk with your health care provider or a dietitian about healthy eating and healthy lifestyle choices. You may also find other information through these resources:  U.S. Department of Agriculture MyPlate: FormerBoss.no  American Heart Association: www.heart.org  Centers for Disease Control and Prevention: http://www.wolf.info/  Summary  Staying at a healthy weight is important. It helps  prevent certain diseases and health problems, such as heart disease, diabetes, joint problems, sleep disorders, and some cancers.  Being obese or overweight can cause you to develop joint or bone problems, which can make it hard for you to stay active or do activities you enjoy.  You can prevent unhealthy weight gain by eating a healthy diet, exercising regularly, not smoking, limiting alcohol, and getting enough sleep.  Talk with your health care provider or a dietitian for guidance about healthy eating and healthy lifestyle choices. This information is not intended to replace advice given to you by your health care provider. Make sure you discuss any questions you have with your health care provider. Document Released: 03/03/2016 Document Revised: 04/08/2016 Document Reviewed: 04/08/2016 Elsevier Interactive Patient Education  2018 [employer].  Obesity, Adult Obesity is the condition of having too much total body fat. Being overweight or obese means that your weight is greater than what is considered healthy for your body size. Obesity is determined by a measurement called BMI. BMI is an estimate of body fat and is calculated from height and weight. For adults, a  BMI of 30 or higher is considered obese. Obesity can eventually lead to other health concerns and major illnesses, including:  Stroke.  Coronary artery disease (CAD).  Type 2 diabetes.  Some types of cancer, including cancers of the colon, breast, uterus, and gallbladder.  Osteoarthritis.  High blood pressure (hypertension).  High cholesterol.  Sleep apnea.  Gallbladder stones.  Infertility problems.  What are the causes? The main cause of obesity is taking in (consuming) more calories than your body uses for energy. Other factors that contribute to this condition may include:  Being born with genes that make you more likely to become obese.  Having a medical condition that causes obesity. These conditions  include: ? Hypothyroidism. ? Polycystic ovarian syndrome (PCOS). ? Binge-eating disorder. ? Cushing syndrome.  Taking certain medicines, such as steroids, antidepressants, and seizure medicines.  Not being physically active (sedentary lifestyle).  Living where there are limited places to exercise safely or buy healthy foods.  Not getting enough sleep.  What increases the risk? The following factors may increase your risk of this condition:  Having a family history of obesity.  Being a woman of African-American descent.  Being a man of Hispanic descent.  What are the signs or symptoms? Having excessive body fat is the main symptom of this condition. How is this diagnosed? This condition may be diagnosed based on:  Your symptoms.  Your medical history.  A physical exam. Your health care provider may measure: ? Your BMI. If you are an adult with a BMI between 25 and less than 30, you are considered overweight. If you are an adult with a BMI of 30 or higher, you are considered obese. ? The distances around your hips and your waist (circumferences). These may be compared to each other to help diagnose your condition. ? Your skinfold thickness. Your health care provider may gently pinch a fold of your skin and measure it.  How is this treated? Treatment for this condition often includes changing your lifestyle. Treatment may include some or all of the following:  Dietary changes. Work with your health care provider and a dietitian to set a weight-loss goal that is healthy and reasonable for you. Dietary changes may include eating: ? Smaller portions. A portion size is the amount of a particular food that is healthy for you to eat at one time. This varies from person to person. ? Low-calorie or low-fat options. ? More whole grains, fruits, and vegetables.  Regular physical activity. This may include aerobic activity (cardio) and strength training.  Medicine to help you lose  weight. Your health care provider may prescribe medicine if you are unable to lose 1 pound a week after 6 weeks of eating more healthily and doing more physical activity.  Surgery. Surgical options may include gastric banding and gastric bypass. Surgery may be done if: ? Other treatments have not helped to improve your condition. ? You have a BMI of 40 or higher. ? You have life-threatening health problems related to obesity.  Follow these instructions at home:  Eating and drinking   Follow recommendations from your health care provider about what you eat and drink. Your health care provider may advise you to: ? Limit fast foods, sweets, and processed snack foods. ? Choose low-fat options, such as low-fat milk instead of whole milk. ? Eat 5 or more servings of fruits or vegetables every day. ? Eat at home more often. This gives you more control over what you eat. ? Choose  healthy foods when you eat out. ? Learn what a healthy portion size is. ? Keep low-fat snacks on hand. ? Avoid sugary drinks, such as soda, fruit juice, iced tea sweetened with sugar, and flavored milk. ? Eat a healthy breakfast.  Drink enough water to keep your urine clear or pale yellow.  Do not go without eating for long periods of time (do not fast) or follow a fad diet. Fasting and fad diets can be unhealthy and even dangerous. Physical Activity  Exercise regularly, as told by your health care provider. Ask your health care provider what types of exercise are safe for you and how often you should exercise.  Warm up and stretch before being active.  Cool down and stretch after being active.  Rest between periods of activity. Lifestyle  Limit the time that you spend in front of your TV, computer, or video game system.  Find ways to reward yourself that do not involve food.  Limit alcohol intake to no more than 1 drink a day for nonpregnant women and 2 drinks a day for men. One drink equals 12 oz of beer,  5 oz of wine, or 1 oz of hard liquor. General instructions  Keep a weight loss journal to keep track of the food you eat and how much you exercise you get.  Take over-the-counter and prescription medicines only as told by your health care provider.  Take vitamins and supplements only as told by your health care provider.  Consider joining a support group. Your health care provider may be able to recommend a support group.  Keep all follow-up visits as told by your health care provider. This is important. Contact a health care provider if:  You are unable to meet your weight loss goal after 6 weeks of dietary and lifestyle changes. This information is not intended to replace advice given to you by your health care provider. Make sure you discuss any questions you have with your health care provider. Document Released: 04/09/2004 Document Revised: 08/05/2015 Document Reviewed: 12/19/2014 Elsevier Interactive Patient Education  2018 [employer].

## 2017-07-30 NOTE — Progress Notes (Signed)
BP 136/85   Pulse 91   Temp 97.6 F (36.4 C) (Oral)   Resp 14   Ht 5\' 7"  (1.702 m)   Wt 218 lb 8 oz (99.1 kg)   SpO2 95%   BMI 34.22 kg/m    Subjective:    Patient ID: Desiree Walker, female    DOB: 1960/08/29, 57 y.o.   MRN: [id number]  HPI: Desiree Walker is a 57 y.o. female  Chief Complaint  Patient presents with  . Medication Refill    HPI  She is here for f/u She has lost a few pounds since last visit; she is trying She saw her cholesterol labs just done; she refuses to take a statin  She has Hashimoto's thyroiditis; it is tougher for her to lose weight she believes; she has seen an endocrinologist Chronic dysphagia, since dx; no odynophagia; [place] in 2015 showed small thyroid, with hypothyrodism She takes the [facility] name; generic thyroid med caused levels to be unstable; would have liked generic because cheaper Already had EGD and GI said no worries  Assessment Dysphagia Hypothyroidism and Hashimoto's thyroiditis  Plan -Advised patient that given chronic nature of dysphagia and her prior ultrasound in 2015 notable for a small thyroid gland without nodules, that it is unlikely that her thyroid gland is the source of her discomfort. Will obtain neck US just to be sure. Likely with esophageal narrowing. She has had dilation in the past. May benefit from follow up with gastroenterology for their thoughts.  -Continue BRAND Synthroid as prescribed.  -She should return for follow up in 6 months or sooner if needed.    Vitamin D was low last 4 checks; 22 was the last check; did the once a week, but has not started the most recent 4 pills  Marital issues with husband; has been depressed lately; wonders if thyroid could be off too, though; she is safe; she had grilled chicken salad with [place] dressing; tries to choose healthier options when she eats out; going to [employer], getting [employer] chicken with veggies, 3/4 of the plate is vegetables She does not want an  antidepressant; she wants to work it out with what makes her happy  HTN; she will try to lose weight; eating multigrain cheerios with berries, skim milk  She works in a clean room; red rash on her hands; right more than left; bunny suit and that hits her hand, then gloves over it; thinks it is the inside of the white bunny suit; hoping to see if the paper suit doesn't touch her hands; just the hands; came out of nowhere; started breaking out; no changes to gloves and soap; [facility]; using the TAC  Depression screen [facility] 2/9 07/30/2017 01/15/2017 10/13/2016 12/17/2015 08/05/2015  Decreased Interest 0 0 0 0 0  Down, Depressed, Hopeless 1 0 0 1 2  PHQ - 2 Score 1 0 0 1 2  Altered sleeping - - - - 0  Tired, decreased energy - - - - 1  Change in appetite - - - - 0  Feeling bad or failure about yourself  - - - - 2  Trouble concentrating - - - - 2  Moving slowly or fidgety/restless - - - - 0  Suicidal thoughts - - - - 0  PHQ-9 Score - - - - 7  Difficult doing work/chores - - - - Somewhat difficult    Relevant past medical, surgical, family and social history reviewed Past Medical  History:  Diagnosis Date  . Anemia    H/O  . Anxiety   . Complication of anesthesia   . Family history of breast cancer    cancer genetic testing letter sent 8/18  . Family history of ovarian cancer    cancer genetic testing letter sent 8/18  . GERD (gastroesophageal reflux disease)    NO MEDS  . Hyperlipidemia   . Hypertension    NO MEDS CURRENTLY-OFF MEDS SINCE 2015  . Hypothyroidism    Hashimoto's per patient  . IFG (impaired fasting glucose) 08/05/2015  . Metabolic syndrome 03/21/2692  . Palpitations 2012   Evaluated by Ida Rogue, MD, Holter moniter  . PONV (postoperative nausea and vomiting)   . Rectal mass   . Thyroid disease    unspecified hypothyroidism   Past Surgical History:  Procedure Laterality Date  . ANAL FISTULOTOMY N/A 11/30/2014   Procedure: ANAL FISTULOTOMY;  Surgeon:  Dia Crawford III, MD;  Location: [facility] ORS;  Service: General;  Laterality: N/A;  . CESAREAN SECTION    . DILATION AND CURETTAGE OF UTERUS    . left foot surgery    . RECTAL EXAM UNDER ANESTHESIA N/A 11/30/2014   Procedure: RECTAL EXAM UNDER ANESTHESIA;  Surgeon: Dia Crawford III, MD;  Location: [facility] ORS;  Service: General;  Laterality: N/A;   Family History  Problem Relation Age of Onset  . Hypertension Mother   . Heart disease Mother 26       "blockages"  . Alzheimer's disease Mother   . Diverticulitis Father   . Hernia Father   . Depression Father   . Alzheimer's disease Father   . Hypothyroidism Sister   . Uterine cancer Sister 35  . Breast cancer Maternal Aunt 16  . Hypothyroidism Daughter   . Hypertension Brother   . Stroke Maternal Grandfather   . Heart attack Maternal Grandfather   . Stroke Paternal Grandmother   . Hypothyroidism Sister   . Hypothyroidism Daughter   . Ovarian cancer Paternal Aunt 66  . Breast cancer Other 30   Social History   Tobacco Use  . Smoking status: Never Smoker  . Smokeless tobacco: Never Used  Substance Use Topics  . Alcohol use: No    Alcohol/week: 0.0 oz  . Drug use: No    Interim medical history since last visit reviewed. Allergies and medications reviewed  Review of Systems Per HPI unless specifically indicated above     Objective:    BP 136/85   Pulse 91   Temp 97.6 F (36.4 C) (Oral)   Resp 14   Ht 5\' 7"  (1.702 m)   Wt 218 lb 8 oz (99.1 kg)   SpO2 95%   BMI 34.22 kg/m   Wt Readings from Last 3 Encounters:  07/30/17 218 lb 8 oz (99.1 kg)  07/08/17 219 lb 3.2 oz (99.4 kg)  06/16/17 218 lb 14.4 oz (99.3 kg)    Physical Exam  Constitutional: She appears well-developed and well-nourished. No distress.  HENT:  Head: Normocephalic and atraumatic.  Eyes: EOM are normal. No scleral icterus.  Neck: No thyromegaly present.  Cardiovascular: Normal rate, regular rhythm and normal heart sounds.  No murmur  heard. Pulmonary/Chest: Effort normal and breath sounds normal. No respiratory distress. She has no wheezes.  Abdominal: Soft. Bowel sounds are normal. She exhibits no distension.  Musculoskeletal: She exhibits no edema.  Neurological: She is alert. She exhibits normal muscle tone.  Skin: Skin is warm and dry. Rash (erythematous lesions w/areas c/w  broken vesicles on hands) noted. She is not diaphoretic. No pallor.  Psychiatric: She has a normal mood and affect. Her behavior is normal. Judgment and thought content normal.    Results for orders placed or performed in visit on 07/26/17  COMPLETE METABOLIC PANEL WITH GFR  Result Value Ref Range   Glucose, Bld 103 (H) 65 - 99 mg/dL   BUN 18 7 - 25 mg/dL   Creat 0.80 0.50 - 1.05 mg/dL   GFR, Est Non African American 81 > OR = 60 mL/min/1.4m2   GFR, Est African American 94 > OR = 60 mL/min/1.64m2   BUN/Creatinine Ratio NOT APPLICABLE 6 - 22 (calc)   Sodium 138 135 - 146 mmol/L   Potassium 4.3 3.5 - 5.3 mmol/L   Chloride 104 98 - 110 mmol/L   CO2 27 20 - 32 mmol/L   Calcium 9.5 8.6 - 10.4 mg/dL   Total Protein 6.8 6.1 - 8.1 g/dL   Albumin 4.3 3.6 - 5.1 g/dL   Globulin 2.5 1.9 - 3.7 g/dL (calc)   AG Ratio 1.7 1.0 - 2.5 (calc)   Total Bilirubin 0.4 0.2 - 1.2 mg/dL   Alkaline phosphatase (APISO) 82 33 - 130 U/L   AST 16 10 - 35 U/L   ALT 12 6 - 29 U/L  Hemoglobin A1c  Result Value Ref Range   Hgb A1c MFr Bld 5.9 (H) <5.7 % of total Hgb   Mean Plasma Glucose 123 (calc)   eAG (mmol/L) 6.8 (calc)  Lipid panel  Result Value Ref Range   Cholesterol 226 (H) <200 mg/dL   HDL 54 >50 mg/dL   Triglycerides 147 <150 mg/dL   LDL Cholesterol (Calc) 145 (H) mg/dL (calc)   Total CHOL/HDL Ratio 4.2 <5.0 (calc)   Non-HDL Cholesterol (Calc) 172 (H) <130 mg/dL (calc)  VITAMIN D 25 Hydroxy (Vit-D Deficiency, Fractures)  Result Value Ref Range   Vit D, 25-Hydroxy 22 (L) 30 - 100 ng/mL  TSH  Result Value Ref Range   TSH 1.04 0.40 - 4.50 mIU/L       Assessment & Plan:   Problem List Items Addressed This Visit      Cardiovascular and Mediastinum   Hypertension goal BP (blood pressure) < 140/90 (Chronic)    Work on weight loss, DASH guidelines        Endocrine   Autoimmune lymphocytic chronic thyroiditis    Seen by endo previously; monitoring thyroid function      Relevant Medications   levothyroxine (SYNTHROID) 112 MCG tablet     Other   Vitamin D deficiency    Supplement as needed      Obesity    Encouragement given to lose weight; discussed using app on phone or other tracking and nutrition aide       Other Visit Diagnoses    Dyshidrotic eczema       Relevant Medications   triamcinolone ointment (KENALOG) 0.1 %       Follow up plan: Return in about 3 months (around 10/30/2017) for follow-up visit with Dr. Sanda Klein.  An after-visit summary was printed and given to the patient at [place].  Please see the patient instructions which may contain other information and recommendations beyond what is mentioned above in the assessment and plan.  Meds ordered this encounter  Medications  . levothyroxine (SYNTHROID) 112 MCG tablet    Sig: TAKE ONE TABLET DAILY. EVERY SUNDAY TAKE AN EXTRA ONE-HALF TABLET.    Dispense:  92 tablet  Refill:  3    BRAND NAME SYNTHROID MEDICALLY NECESSARY  . triamcinolone ointment (KENALOG) 0.1 %    Sig: Apply 1 application topically 2 (two) times daily.    Dispense:  80 g    Refill:  1    No orders of the defined types were placed in this encounter.

## 2017-08-09 NOTE — Assessment & Plan Note (Signed)
Supplement as needed

## 2017-08-09 NOTE — Assessment & Plan Note (Signed)
Seen by endo previously; monitoring thyroid function

## 2017-08-09 NOTE — Assessment & Plan Note (Signed)
Work on weight loss, DASH guidelines

## 2017-08-09 NOTE — Assessment & Plan Note (Signed)
Encouragement given to lose weight; discussed using app on phone or other tracking and nutrition aide

## 2017-08-11 ENCOUNTER — Ambulatory Visit: Payer: Commercial Managed Care - PPO | Admitting: Nurse Practitioner

## 2017-08-11 ENCOUNTER — Other Ambulatory Visit: Payer: Commercial Managed Care - PPO | Admitting: Urology

## 2017-08-20 ENCOUNTER — Other Ambulatory Visit: Payer: Self-pay | Admitting: Family Medicine

## 2017-08-22 NOTE — Progress Notes (Signed)
This encounter was created in error - please disregard.

## 2017-09-21 ENCOUNTER — Encounter: Payer: Self-pay | Admitting: Emergency Medicine

## 2017-09-21 ENCOUNTER — Ambulatory Visit: Payer: Commercial Managed Care - PPO | Admitting: Family Medicine

## 2017-09-21 ENCOUNTER — Encounter: Payer: Self-pay | Admitting: Family Medicine

## 2017-09-21 VITALS — BP 124/76 | HR 99 | Temp 97.5°F | Resp 18 | Ht 67.0 in | Wt 216.5 lb

## 2017-09-21 DIAGNOSIS — Z1212 Encounter for screening for malignant neoplasm of rectum: Secondary | ICD-10-CM

## 2017-09-21 DIAGNOSIS — M545 Low back pain, unspecified: Secondary | ICD-10-CM

## 2017-09-21 DIAGNOSIS — Z1211 Encounter for screening for malignant neoplasm of colon: Secondary | ICD-10-CM

## 2017-09-21 MED ORDER — MELOXICAM 15 MG PO TABS: 15.0000 mg | ORAL_TABLET | Freq: Every day | ORAL | 0 refills | Status: DC

## 2017-09-21 MED ORDER — TIZANIDINE HCL 4 MG PO TABS: 4.0000 mg | ORAL_TABLET | Freq: Four times a day (QID) | ORAL | 0 refills | Status: DC | PRN

## 2017-09-21 NOTE — Patient Instructions (Signed)
Please go directly to [facility] at 1:00pm to attend their walk-in clinic for further evaluation of your back injury.  Please avoid any lifting, bending, or sudden movement until you are cleared by the orthopedist.

## 2017-09-21 NOTE — Progress Notes (Signed)
Name: Desiree Walker   MRN: [id number]    DOB: [birth year]   Date:09/21/2017       Progress Note  Subjective  Chief Complaint  Chief Complaint  Patient presents with  . Back Pain    patient presents with mid to lower back pain that started on Sunday. patient bent over to get something and maybe overly stretched. patient has tried rest & ibuprofen - some relief    HPI  Pt presents with concern for mid to low back pain - midline just above the waist - started immediately after bending over to pick something up 2 days ago.  Now, she is very uncomfortable when she tries to sleep and is having intermittent spasms throughout the day.  She denies difficulty walking or BLE weakness. She does endorse some numbness to the LEFT LE throughout the night last night - less than 30 minutes.  She has taken some ibuprofen with some relief of pain. She had to call out of work today because she is in so much pain.  Patient Active Problem List   Diagnosis Date Noted  . Eczema 06/10/2017  . Preventative health care 01/15/2017  . History of uterine fibroid 10/23/2016  . Metabolic syndrome 17/40/8144  . Medication monitoring encounter 10/13/2016  . Pressure in chest 12/17/2015  . Anxiety disorder 12/17/2015  . Obesity 12/17/2015  . Sleep apnea 08/29/2015  . Snoring 08/27/2015  . Vitamin D deficiency 08/19/2015  . Leg cramps 08/05/2015  . IFG (impaired fasting glucose) 08/05/2015  . Anal fistula   . Other specified symptoms and signs involving the digestive system and abdomen 10/31/2014  . 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 Bladder neoplasm of uncertain malignant potential 06/09/2013  . Female genuine stress incontinence 06/05/2013  . Incomplete bladder emptying 06/05/2013  . Urge incontinence 06/05/2013  . Dysuria 06/05/2013  . Other chronic cystitis without hematuria 06/05/2013  . Retention of urine 06/05/2013  . Right lower quadrant pain 07/27/2012  . Palpitations 09/11/2010   . Hypothyroidism, adult 05/10/2009  . Hyperlipidemia LDL goal <130 05/10/2009  . Hypertension goal BP (blood pressure) < 140/90 05/10/2009    Social History   Tobacco Use  . Smoking status: Never Smoker  . Smokeless tobacco: Never Used  Substance Use Topics  . Alcohol use: No    Alcohol/week: 0.0 oz     Current Outpatient Medications:  .  aspirin EC 81 MG tablet, Take 1 tablet (81 mg total) by mouth daily., Disp: , Rfl:  .  cholecalciferol (VITAMIN D) 1000 units tablet, Take 1,000 Units by mouth daily., Disp: , Rfl:  .  levothyroxine (SYNTHROID) 112 MCG tablet, TAKE ONE TABLET DAILY. EVERY SUNDAY TAKE AN EXTRA ONE-HALF TABLET., Disp: 92 tablet, Rfl: 3 .  losartan (COZAAR) 50 MG tablet, Take 1.5 tablets (75 mg total) by mouth daily., Disp: 135 tablet, Rfl: 1 .  meloxicam (MOBIC) 15 MG tablet, Take 1 tablet (15 mg total) by mouth daily. (Patient taking differently: Take 15 mg by mouth as needed. ), Disp: 30 tablet, Rfl: 5 .  triamcinolone ointment (KENALOG) 0.1 %, Apply 1 application topically 2 (two) times daily., Disp: 80 g, Rfl: 1  Allergies  Allergen Reactions  . Scopolamine Other (See Comments)    "[place]"    ROS  Constitutional: Negative for fever or weight change.  Respiratory: Negative for cough and shortness of breath.   Cardiovascular: Negative for chest pain or palpitations.  Gastrointestinal: Negative for abdominal pain, no bowel  changes.  Musculoskeletal: Negative for gait problem or joint swelling.  Skin: Negative for rash.  Neurological: Negative for dizziness or headache.  No other specific complaints in a complete review of systems (except as listed in HPI above).  Objective  Vitals:   09/21/17 1049  Pulse: 99  Resp: 18  Temp: (!) 97.5 F (36.4 C)  TempSrc: Oral  SpO2: 98%  Weight: 216 lb 8 oz (98.2 kg)  Height: 5\' 7"  (1.702 m)   Body mass index is 33.91 kg/m.  Nursing Note and Vital Signs reviewed.  Physical  Exam  Constitutional: Patient appears well-developed and well-nourished. Obese.  No distress.  HEENT: head atraumatic, normocephalic Cardiovascular: Normal rate, regular rhythm, S1/S2 present.  No murmur or rub heard. No BLE edema. Pulmonary/Chest: Effort normal and breath sounds clear. No respiratory distress or retractions. Psychiatric: Patient has a normal mood and affect. behavior is normal. Judgment and thought content normal. Musculoskeletal: Normal range of motion, no joint effusions. No gross deformities. Lumbosacral spinal tenderness.  Neurological: she is alert and oriented to person, place, and time. No cranial nerve deficit. Coordination, balance, strength, speech and gait are normal.  Skin: Skin is warm and dry. No rash noted. No erythema.   No results found for this or any previous visit (from the past 72 hour(s)).  Assessment & Plan  1. Spine pain, lumbosacral - tiZANidine (ZANAFLEX) 4 MG tablet; Take 1 tablet (4 mg total) by mouth every 6 (six) hours as needed for muscle spasms.  Dispense: 10 tablet; Refill: 0 - meloxicam (MOBIC) 15 MG tablet; Take 1 tablet (15 mg total) by mouth daily.  Dispense: 10 tablet; Refill: 0 - Ambulatory referral to Orthopedic Surgery - Advised to go directly to [facility] as they do not have any urgent appointments available today. - Work note provided to allow patient to be out today to see Ortho.  2. Screening for colorectal cancer - Ambulatory referral to Gastroenterology  -Red flags and when to present for emergency care or RTC including fever >101.65F, chest pain, shortness of breath, new/worsening/un-resolving symptoms, numbness/tingling/saddle anesthesia, LE weakness, reviewed with patient at time of visit. Follow up and care instructions discussed and provided in AVS.  Health Maintenance: Colonoscopy referral made.

## 2017-09-22 ENCOUNTER — Ambulatory Visit: Payer: Commercial Managed Care - PPO | Admitting: Family Medicine

## 2017-09-30 ENCOUNTER — Telehealth: Payer: Self-pay | Admitting: Gastroenterology

## 2017-09-30 NOTE — Telephone Encounter (Signed)
Gastroenterology Pre-Procedure Review  Request Date: 11/09/17  [facility] Requesting Physician: Dr. Allen Norris  PATIENT REVIEW QUESTIONS: The patient responded to the following health history questions as indicated:    1. Are you having any GI issues? no 2. Do you have a personal history of Polyps? no 3. Do you have a family history of Colon Cancer or Polyps? yes (Maternal Uncle CC) 4. Diabetes Mellitus? no 5. Joint replacements in the past 12 months?no 6. Major health problems in the past 3 months?no 7. Any artificial heart valves, MVP, or defibrillator?no    MEDICATIONS & ALLERGIES:    Patient reports the following regarding taking any anticoagulation/antiplatelet therapy:   Plavix, Coumadin, Eliquis, Xarelto, Lovenox, Pradaxa, Brilinta, or Effient? no Aspirin? no  Patient confirms/reports the following medications:  Current Outpatient Medications  Medication Sig Dispense Refill  . aspirin EC 81 MG tablet Take 1 tablet (81 mg total) by mouth daily.    . cetirizine (ZYRTEC) 10 MG tablet Take by mouth.    . cholecalciferol (VITAMIN D) 1000 units tablet Take 1,000 Units by mouth daily.    Marland Kitchen levothyroxine (SYNTHROID) 112 MCG tablet TAKE ONE TABLET DAILY. EVERY SUNDAY TAKE AN EXTRA ONE-HALF TABLET. 92 tablet 3  . losartan (COZAAR) 50 MG tablet Take 1.5 tablets (75 mg total) by mouth daily. 135 tablet 1  . meloxicam (MOBIC) 15 MG tablet Take 1 tablet (15 mg total) by mouth daily. 10 tablet 0  . montelukast (SINGULAIR) 10 MG tablet Take 10 mg by mouth daily.  3  . ranitidine (ZANTAC) 150 MG tablet Take by mouth.    Marland Kitchen tiZANidine (ZANAFLEX) 4 MG tablet Take 1 tablet (4 mg total) by mouth every 6 (six) hours as needed for muscle spasms. 10 tablet 0  . triamcinolone ointment (KENALOG) 0.1 % Apply 1 application topically 2 (two) times daily. 80 g 1   No current facility-administered medications for this visit.     Patient confirms/reports the following allergies:  Allergies  Allergen Reactions  .  Scopolamine Other (See Comments)    "[place]"    No orders of the defined types were placed in this encounter.   AUTHORIZATION INFORMATION Primary Insurance: 1D#: Group #:  Secondary Insurance: 1D#: Group #:  SCHEDULE INFORMATION: Date: 11/09/17    Allen Norris Time: Location: [facility]

## 2017-10-04 ENCOUNTER — Other Ambulatory Visit: Payer: Self-pay

## 2017-10-04 DIAGNOSIS — Z1211 Encounter for screening for malignant neoplasm of colon: Secondary | ICD-10-CM

## 2017-10-20 ENCOUNTER — Ambulatory Visit: Payer: Commercial Managed Care - PPO | Admitting: Family Medicine

## 2017-10-25 ENCOUNTER — Other Ambulatory Visit: Payer: Self-pay | Admitting: Family Medicine

## 2017-10-25 NOTE — Telephone Encounter (Signed)
Lab Results  Component Value Date   TSH 1.04 07/26/2017   Desiree Walker, please resolve the thyroid medicine request with pharmacy They requested one a day but she is taking an extra half pill once a week and should NOT need refills until May 2020

## 2017-10-27 ENCOUNTER — Ambulatory Visit: Payer: Commercial Managed Care - PPO | Admitting: Orthotics

## 2017-10-27 DIAGNOSIS — M2042 Other hammer toe(s) (acquired), left foot: Principal | ICD-10-CM

## 2017-10-27 DIAGNOSIS — G5792 Unspecified mononeuropathy of left lower limb: Secondary | ICD-10-CM

## 2017-10-27 DIAGNOSIS — M659 Synovitis and tenosynovitis, unspecified: Secondary | ICD-10-CM

## 2017-10-27 DIAGNOSIS — M2041 Other hammer toe(s) (acquired), right foot: Secondary | ICD-10-CM

## 2017-10-27 NOTE — Progress Notes (Signed)
Patient came into today to be cast for [facility]. Upon recommendation of Dr. Amalia Hailey Patient presents with ankle fusion, pes planus, and hallux rigidus R Goals are RF stabiity and locking hallux down Plan vendor [place]

## 2017-10-29 ENCOUNTER — Encounter: Payer: Self-pay | Admitting: Podiatry

## 2017-10-29 ENCOUNTER — Ambulatory Visit: Payer: Commercial Managed Care - PPO | Admitting: Podiatry

## 2017-10-29 DIAGNOSIS — M545 Low back pain, unspecified: Secondary | ICD-10-CM

## 2017-10-29 DIAGNOSIS — G5792 Unspecified mononeuropathy of left lower limb: Secondary | ICD-10-CM | POA: Diagnosis not present

## 2017-10-29 DIAGNOSIS — M2041 Other hammer toe(s) (acquired), right foot: Secondary | ICD-10-CM

## 2017-10-29 DIAGNOSIS — M2042 Other hammer toe(s) (acquired), left foot: Secondary | ICD-10-CM

## 2017-10-29 DIAGNOSIS — M659 Synovitis and tenosynovitis, unspecified: Secondary | ICD-10-CM | POA: Diagnosis not present

## 2017-10-29 MED ORDER — MELOXICAM 15 MG PO TABS: 15.0000 mg | ORAL_TABLET | Freq: Every day | ORAL | 2 refills | Status: DC

## 2017-10-29 NOTE — Progress Notes (Signed)
   Subjective: 57 year old female presents today for follow up evaluation of bilateral foot pain. She reports an increase in pain and swelling. She states the injection she has received in the past has provided some relief of the pain. Walking increases the symptoms. Patient presents today for further treatment and evaluation.    Past Medical History:  Diagnosis Date  . Anemia    H/O  . Anxiety   . Complication of anesthesia   . Family history of breast cancer    cancer genetic testing letter sent 8/18  . Family history of ovarian cancer    cancer genetic testing letter sent 8/18  . GERD (gastroesophageal reflux disease)    NO MEDS  . Hyperlipidemia   . Hypertension    NO MEDS CURRENTLY-OFF MEDS SINCE 2015  . Hypothyroidism    Hashimoto's per patient  . IFG (impaired fasting glucose) 08/05/2015  . Metabolic syndrome 3/90/3009  . Palpitations 2012   Evaluated by Ida Rogue, MD, Holter moniter  . PONV (postoperative nausea and vomiting)   . Rectal mass   . Thyroid disease    unspecified hypothyroidism     Objective: Physical Exam General: The patient is alert and oriented x3 in no acute distress.  Dermatology: Skin is warm, dry and supple bilateral lower extremities. Negative for open lesions or macerations bilateral.   Vascular: Dorsalis Pedis and Posterior Tibial pulses palpable bilateral.  Capillary fill time is immediate to all digits.  Neurological: Epicritic and protective threshold intact bilateral.   Musculoskeletal: Tenderness to palpation at the medial calcaneal tubercale and through the insertion of the plantar fascia of the right foot. Hammertoe contracture deformity noted to the 5th digit of the bilateral feet. Pain with palpation noted to the medial, lateral and anterior aspects of the left ankle joint. All other joints range of motion within normal limits bilateral. Strength 5/5 in all groups bilateral.    Assessment: 1. Plantar fasciitis right 2.  Prominent orthopedic screws - Orthohelix 4.0 screws to metatarsal cuneiform joint left foot as per operative note dated 10/20/13 3. Painful hardware left lower extremity- 7.0 mm Orthohelix subtalar screws x 2.  4.0 mm Orthohelix Lapidus screws x 2. 4. Hammertoe deformity bilateral 5th toes 5. Left ankle synovitis   Plan of Care:  1. Patient evaluated.  2. Injection of 0.5 mLs Celestone Soluspan injected into the left ankle joint.  3. Refill prescription for Mobic provided to patient.  4. Appointment with Liliane Channel for new orthotics.  5. Return to clinic as needed.   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    Edrick Kins, DPM [facility]  Dr. Edrick Kins, DPM    [address], [place] [address]                Office [phone]  Fax [phone]

## 2017-11-09 ENCOUNTER — Encounter: Admission: RE | Payer: Self-pay | Source: Ambulatory Visit

## 2017-11-09 ENCOUNTER — Ambulatory Visit
Admission: RE | Admit: 2017-11-09 | Payer: Commercial Managed Care - PPO | Source: Ambulatory Visit | Admitting: Gastroenterology

## 2017-11-09 SURGERY — COLONOSCOPY WITH PROPOFOL
Anesthesia: General

## 2017-11-10 ENCOUNTER — Other Ambulatory Visit: Payer: Commercial Managed Care - PPO | Admitting: Orthotics

## 2017-11-17 ENCOUNTER — Other Ambulatory Visit: Payer: Commercial Managed Care - PPO | Admitting: Orthotics

## 2018-01-14 ENCOUNTER — Other Ambulatory Visit: Payer: Self-pay | Admitting: Family Medicine

## 2018-01-14 DIAGNOSIS — I10 Essential (primary) hypertension: Secondary | ICD-10-CM

## 2018-01-18 ENCOUNTER — Encounter: Payer: Self-pay | Admitting: Family Medicine

## 2018-01-19 ENCOUNTER — Ambulatory Visit (INDEPENDENT_AMBULATORY_CARE_PROVIDER_SITE_OTHER): Payer: Commercial Managed Care - PPO | Admitting: Family Medicine

## 2018-01-19 ENCOUNTER — Encounter: Payer: Self-pay | Admitting: Family Medicine

## 2018-01-19 VITALS — BP 138/82 | HR 98 | Temp 97.9°F | Ht 67.0 in | Wt 215.9 lb

## 2018-01-19 DIAGNOSIS — Z0000 Encounter for general adult medical examination without abnormal findings: Secondary | ICD-10-CM | POA: Diagnosis not present

## 2018-01-19 DIAGNOSIS — E6609 Other obesity due to excess calories: Secondary | ICD-10-CM

## 2018-01-19 DIAGNOSIS — Z6833 Body mass index (BMI) 33.0-33.9, adult: Secondary | ICD-10-CM

## 2018-01-19 DIAGNOSIS — Z1211 Encounter for screening for malignant neoplasm of colon: Secondary | ICD-10-CM | POA: Diagnosis not present

## 2018-01-19 DIAGNOSIS — Z1159 Encounter for screening for other viral diseases: Secondary | ICD-10-CM

## 2018-01-19 DIAGNOSIS — Z8041 Family history of malignant neoplasm of ovary: Secondary | ICD-10-CM

## 2018-01-19 NOTE — Progress Notes (Signed)
BP 138/82   Pulse 98   Temp 97.9 F (36.6 C) (Oral)   Ht 5' 7"  (1.702 m)   Wt 215 lb 14.4 oz (97.9 kg)   SpO2 98%   BMI 33.81 kg/m    Subjective:    Patient ID: Desiree Walker, female    DOB: [birth year], 57 y.o.   MRN: [id number]  HPI: Desiree Walker is a 57 y.o. female  Chief Complaint  Patient presents with  . Annual Exam    HPI Patient here for a physical  USPSTF grade A and B recommendations Depression:  Depression screen [facility] 2/9 01/19/2018 09/21/2017 07/30/2017 01/15/2017 10/13/2016  Decreased Interest 0 0 0 0 0  Down, Depressed, Hopeless 0 0 1 0 0  PHQ - 2 Score 0 0 1 0 0  Altered sleeping 1 - - - -  Tired, decreased energy 1 - - - -  Change in appetite 0 - - - -  Feeling bad or failure about yourself  0 - - - -  Trouble concentrating 0 - - - -  Moving slowly or fidgety/restless 0 - - - -  Suicidal thoughts 0 - - - -  PHQ-9 Score 2 - - - -  Difficult doing work/chores - - - - -  had a subtalar fusion, walking on foot, but impairs her sleep; works evening shift  Hypertension: did not take BP medicine, sleep deprived BP Readings from Last 3 Encounters:  01/19/18 138/82  09/21/17 124/76  07/30/17 136/85   Obesity: Wt Readings from Last 3 Encounters:  01/19/18 215 lb 14.4 oz (97.9 kg)  09/21/17 216 lb 8 oz (98.2 kg)  07/30/17 218 lb 8 oz (99.1 kg)   BMI Readings from Last 3 Encounters:  01/19/18 33.81 kg/m  09/21/17 33.91 kg/m  07/30/17 34.22 kg/m    Skin cancer: nothing worrisome Lung cancer:  nonsmoker Breast cancer: April 2019; not doing monthly SBE but encouraged Colorectal cancer: overdue, referred to GI Cervical cancer screening: UTD BRCA gene screening: family hx of breast and/or ovarian cancer and/or metastatic prostate cancer? Sisters have ovarian cancer HIV, hep B, hep C: not needed but I talked pt into Hep C screening STD testing and prevention (chl/gon/syphilis): not needed Intimate partner violence: no abuse Contraception:  n/a Osteoporosis: n/a Fall prevention/vitamin D: discussed Immunizations: flu shot UTD; recommend shingrix  Diet: eating healthier, baked chicken with brown rice; found healthier salad Exercise: does not walk outside of work; getting 5 miles a night at work, easier; may start walking in the neighborhood Alcohol:    [facility] Visit from 01/19/2018 in [facility]  AUDIT-C Score  0     Tobacco use: nevr AAA: n/a Aspirin: will take aspirin she says Glucose:  Glucose  Date Value Ref Range Status  12/20/2015 108 (H) 65 - 99 mg/dL Final  10/10/2013 81 65 - 99 mg/dL Final  09/14/2013 111 (H) 65 - 99 mg/dL Final  04/10/2013 110 (H) 65 - 99 mg/dL Final   Glucose, Bld  Date Value Ref Range Status  07/26/2017 103 (H) 65 - 99 mg/dL Final    Comment:    .            Fasting reference interval . For someone without known diabetes, a glucose value between 100 and 125 mg/dL is consistent with prediabetes and should be confirmed with a follow-up test. .   10/13/2016 102 (H) 65 - 99 mg/dL Final   Glucose-Capillary  Date Value Ref Range  Status  11/30/2014 97 65 - 99 mg/dL Final   Lipids:  Lab Results  Component Value Date   CHOL 226 (H) 07/26/2017   CHOL 195 10/13/2016   CHOL 222 (H) 08/06/2015   Lab Results  Component Value Date   HDL 54 07/26/2017   HDL 48 (L) 10/13/2016   HDL 53 08/06/2015   Lab Results  Component Value Date   LDLCALC 145 (H) 07/26/2017   LDLCALC 114 (H) 10/13/2016   LDLCALC 139 (H) 08/06/2015   Lab Results  Component Value Date   TRIG 147 07/26/2017   TRIG 167 (H) 10/13/2016   TRIG 149 08/06/2015   Lab Results  Component Value Date   CHOLHDL 4.2 07/26/2017   CHOLHDL 4.1 10/13/2016   No results found for: LDLDIRECT   Depression screen [facility] 2/9 01/19/2018 09/21/2017 07/30/2017 01/15/2017 10/13/2016  Decreased Interest 0 0 0 0 0  Down, Depressed, Hopeless 0 0 1 0 0  PHQ - 2 Score 0 0 1 0 0  Altered sleeping 1 - - - -  Tired,  decreased energy 1 - - - -  Change in appetite 0 - - - -  Feeling bad or failure about yourself  0 - - - -  Trouble concentrating 0 - - - -  Moving slowly or fidgety/restless 0 - - - -  Suicidal thoughts 0 - - - -  PHQ-9 Score 2 - - - -  Difficult doing work/chores - - - - -   Fall Risk  01/19/2018 09/21/2017 07/30/2017 01/15/2017 10/13/2016  Falls in the past year? 0 No No No Yes  Number falls in past yr: 0 - - - 1  Injury with Fall? - - - - No    Relevant past medical, surgical, family and social history reviewed Past Medical History:  Diagnosis Date  . Anemia    H/O  . Anxiety   . Complication of anesthesia   . Family history of breast cancer    cancer genetic testing letter sent 8/18  . Family history of ovarian cancer    cancer genetic testing letter sent 8/18  . GERD (gastroesophageal reflux disease)    NO MEDS  . Hyperlipidemia   . Hypertension    NO MEDS CURRENTLY-OFF MEDS SINCE 2015  . Hypothyroidism    Hashimoto's per patient  . IFG (impaired fasting glucose) 08/05/2015  . Metabolic syndrome 07/10/8339  . Palpitations 2012   Evaluated by Ida Rogue, MD, Holter moniter  . PONV (postoperative nausea and vomiting)   . Rectal mass   . Thyroid disease    unspecified hypothyroidism   Past Surgical History:  Procedure Laterality Date  . ANAL FISTULOTOMY N/A 11/30/2014   Procedure: ANAL FISTULOTOMY;  Surgeon: Dia Crawford III, MD;  Location: [facility] ORS;  Service: General;  Laterality: N/A;  . CESAREAN SECTION    . DILATION AND CURETTAGE OF UTERUS    . left foot surgery    . RECTAL EXAM UNDER ANESTHESIA N/A 11/30/2014   Procedure: RECTAL EXAM UNDER ANESTHESIA;  Surgeon: Dia Crawford III, MD;  Location: [facility] ORS;  Service: General;  Laterality: N/A;   Family History  Problem Relation Age of Onset  . Hypertension Mother   . Heart disease Mother 50       "blockages"  . Alzheimer's disease Mother   . Diverticulitis Father   . Hernia Father   . Depression Father   .  Alzheimer's disease Father   . Hypothyroidism Sister   . Uterine  cancer Sister 52  . Breast cancer Maternal Aunt 51  . Hypothyroidism Daughter   . Hypertension Brother   . Stroke Maternal Grandfather   . Heart attack Maternal Grandfather   . Stroke Paternal Grandmother   . Hypothyroidism Sister   . Hypothyroidism Daughter   . Ovarian cancer Paternal Aunt 70  . Breast cancer Other 30   Social History   Tobacco Use  . Smoking status: Never Smoker  . Smokeless tobacco: Never Used  Substance Use Topics  . Alcohol use: No    Alcohol/week: 0.0 standard drinks  . Drug use: No     [facility] Visit from 01/19/2018 in [facility]  AUDIT-C Score  0      Interim medical history since last visit reviewed. Allergies and medications reviewed  Review of Systems Per HPI unless specifically indicated above     Objective:    BP 138/82   Pulse 98   Temp 97.9 F (36.6 C) (Oral)   Ht 5' 7"  (1.702 m)   Wt 215 lb 14.4 oz (97.9 kg)   SpO2 98%   BMI 33.81 kg/m   Wt Readings from Last 3 Encounters:  01/19/18 215 lb 14.4 oz (97.9 kg)  09/21/17 216 lb 8 oz (98.2 kg)  07/30/17 218 lb 8 oz (99.1 kg)    Physical Exam  Constitutional: She appears well-developed and well-nourished.  HENT:  Right Ear: Hearing, tympanic membrane, external ear and ear canal normal.  Left Ear: Hearing, tympanic membrane, external ear and ear canal normal.  Mouth/Throat: No posterior oropharyngeal edema or posterior oropharyngeal erythema.  Eyes: Conjunctivae and EOM are normal. Right eye exhibits no hordeolum. Left eye exhibits no hordeolum. No scleral icterus.  Neck: Carotid bruit is not present. No thyromegaly present.  Cardiovascular: Normal rate, regular rhythm, S1 normal, S2 normal and normal heart sounds.  No extrasystoles are present.  Pulmonary/Chest: Effort normal and breath sounds normal. No respiratory distress.  Abdominal: Soft. Normal appearance and bowel sounds are normal. She  exhibits no distension, no abdominal bruit, no pulsatile midline mass and no mass. There is no hepatosplenomegaly. There is no tenderness. No hernia.  Musculoskeletal: Normal range of motion. She exhibits no edema.  Lymphadenopathy:       Head (right side): No submandibular adenopathy present.       Head (left side): No submandibular adenopathy present.    She has no cervical adenopathy.  Neurological: She is alert. She displays no tremor. She exhibits normal muscle tone. Gait normal.  Reflex Scores:      Patellar reflexes are 2+ on the right side and 2+ on the left side. Skin: Skin is warm and dry. No bruising and no ecchymosis noted. No cyanosis. No pallor.  Psychiatric: Her speech is normal and behavior is normal. Thought content normal. Her mood appears not anxious. She does not exhibit a depressed mood.    Results for orders placed or performed in visit on 07/26/17  COMPLETE METABOLIC PANEL WITH GFR  Result Value Ref Range   Glucose, Bld 103 (H) 65 - 99 mg/dL   BUN 18 7 - 25 mg/dL   Creat 0.80 0.50 - 1.05 mg/dL   GFR, Est Non African American 81 > OR = 60 mL/min/1.47m   GFR, Est African American 94 > OR = 60 mL/min/1.739m  BUN/Creatinine Ratio NOT APPLICABLE 6 - 22 (calc)   Sodium 138 135 - 146 mmol/L   Potassium 4.3 3.5 - 5.3 mmol/L   Chloride 104 98 -  110 mmol/L   CO2 27 20 - 32 mmol/L   Calcium 9.5 8.6 - 10.4 mg/dL   Total Protein 6.8 6.1 - 8.1 g/dL   Albumin 4.3 3.6 - 5.1 g/dL   Globulin 2.5 1.9 - 3.7 g/dL (calc)   AG Ratio 1.7 1.0 - 2.5 (calc)   Total Bilirubin 0.4 0.2 - 1.2 mg/dL   Alkaline phosphatase (APISO) 82 33 - 130 U/L   AST 16 10 - 35 U/L   ALT 12 6 - 29 U/L  Hemoglobin A1c  Result Value Ref Range   Hgb A1c MFr Bld 5.9 (H) <5.7 % of total Hgb   Mean Plasma Glucose 123 (calc)   eAG (mmol/L) 6.8 (calc)  Lipid panel  Result Value Ref Range   Cholesterol 226 (H) <200 mg/dL   HDL 54 >50 mg/dL   Triglycerides 147 <150 mg/dL   LDL Cholesterol (Calc) 145 (H)  mg/dL (calc)   Total CHOL/HDL Ratio 4.2 <5.0 (calc)   Non-HDL Cholesterol (Calc) 172 (H) <130 mg/dL (calc)  VITAMIN D 25 Hydroxy (Vit-D Deficiency, Fractures)  Result Value Ref Range   Vit D, 25-Hydroxy 22 (L) 30 - 100 ng/mL  TSH  Result Value Ref Range   TSH 1.04 0.40 - 4.50 mIU/L      Assessment & Plan:   Problem List Items Addressed This Visit      Other   Preventative health care - Primary    USPSTF grade A and B recommendations reviewed with patient; age-appropriate recommendations, preventive care, screening tests, etc discussed and encouraged; healthy living encouraged; see AVS for patient education given to patient       Relevant Orders   CBC with Differential/Platelet   COMPLETE METABOLIC PANEL WITH GFR   Lipid panel   TSH   Obesity    Encouragement given; she politely declined referral to nutritionist       Other Visit Diagnoses    Screen for colon cancer       Relevant Orders   Ambulatory referral to Gastroenterology   Family history of ovarian cancer       Relevant Orders   Ambulatory referral to Genetics   Need for hepatitis C screening test       Relevant Orders   Hepatitis C antibody       Follow up plan: Return in about 1 year (around 01/20/2019) for complete physical; 2 months for regular follow-up.  An after-visit summary was printed and given to the patient at [place].  Please see the patient instructions which may contain other information and recommendations beyond what is mentioned above in the assessment and plan.  No orders of the defined types were placed in this encounter.   Orders Placed This Encounter  Procedures  . CBC with Differential/Platelet  . COMPLETE METABOLIC PANEL WITH GFR  . Lipid panel  . TSH  . Hepatitis C antibody  . Ambulatory referral to Gastroenterology  . Ambulatory referral to [facility]

## 2018-01-19 NOTE — Assessment & Plan Note (Signed)
Encouragement given; she politely declined referral to nutritionist

## 2018-01-19 NOTE — Assessment & Plan Note (Signed)
USPSTF grade A and B recommendations reviewed with patient; age-appropriate recommendations, preventive care, screening tests, etc discussed and encouraged; healthy living encouraged; see AVS for patient education given to patient  

## 2018-01-19 NOTE — Patient Instructions (Addendum)
Try to follow the DASH guidelines (DASH stands for Dietary Approaches to Stop Hypertension). Try to limit the sodium in your diet to no more than 1,500mg  of sodium per day. Certainly try to not exceed 2,000 mg per day at the very most. Do not add salt when cooking or at the table.  Check the sodium amount on labels when shopping, and choose items lower in sodium when given a choice. Avoid or limit foods that already contain a lot of sodium. Eat a diet rich in fruits and vegetables and whole grains, and try to lose weight if overweight or obese  Consider getting the new shingles vaccine called Shingrix; that is available for individuals 37 years of age and older, and is recommended even if you have had shingles in the past and/or already received the old shingles vaccine (Zostavax); it is a two-part series, and is available at many local pharmacies  Try to get 800 iu of vitamin D3 daily on days when you don't get much sun exposure  Check out the information at familydoctor.org entitled "Nutrition for Weight Loss: What You Need to Know about Fad Diets" Try to lose between 1-2 pounds per week by taking in fewer calories and burning off more calories You can succeed by limiting portions, limiting foods dense in calories and fat, becoming more active, and drinking 8 glasses of water a day (64 ounces) Don't skip meals, especially breakfast, as skipping meals may alter your metabolism Do not use over-the-counter weight loss pills or gimmicks that claim rapid weight loss A healthy BMI (or body mass index) is between 18.5 and 24.9 You can calculate your ideal BMI at the [place] website ClubMonetize.fr   Obesity, Adult Obesity is the condition of having too much total body fat. Being overweight or obese means that your weight is greater than what is considered healthy for your body size. Obesity is determined by a measurement called BMI. BMI is an estimate of  body fat and is calculated from height and weight. For adults, a BMI of 30 or higher is considered obese. Obesity can eventually lead to other health concerns and major illnesses, including:  Stroke.  Coronary artery disease (CAD).  Type 2 diabetes.  Some types of cancer, including cancers of the colon, breast, uterus, and gallbladder.  Osteoarthritis.  High blood pressure (hypertension).  High cholesterol.  Sleep apnea.  Gallbladder stones.  Infertility problems.  What are the causes? The main cause of obesity is taking in (consuming) more calories than your body uses for energy. Other factors that contribute to this condition may include:  Being born with genes that make you more likely to become obese.  Having a medical condition that causes obesity. These conditions include: ? Hypothyroidism. ? Polycystic ovarian syndrome (PCOS). ? Binge-eating disorder. ? Cushing syndrome.  Taking certain medicines, such as steroids, antidepressants, and seizure medicines.  Not being physically active (sedentary lifestyle).  Living where there are limited places to exercise safely or buy healthy foods.  Not getting enough sleep.  What increases the risk? The following factors may increase your risk of this condition:  Having a family history of obesity.  Being a woman of African-American descent.  Being a man of Hispanic descent.  What are the signs or symptoms? Having excessive body fat is the main symptom of this condition. How is this diagnosed? This condition may be diagnosed based on:  Your symptoms.  Your medical history.  A physical exam. Your health care provider may measure: ?  Your BMI. If you are an adult with a BMI between 25 and less than 30, you are considered overweight. If you are an adult with a BMI of 30 or higher, you are considered obese. ? The distances around your hips and your waist (circumferences). These may be compared to each other to help  diagnose your condition. ? Your skinfold thickness. Your health care provider may gently pinch a fold of your skin and measure it.  How is this treated? Treatment for this condition often includes changing your lifestyle. Treatment may include some or all of the following:  Dietary changes. Work with your health care provider and a dietitian to set a weight-loss goal that is healthy and reasonable for you. Dietary changes may include eating: ? Smaller portions. A portion size is the amount of a particular food that is healthy for you to eat at one time. This varies from person to person. ? Low-calorie or low-fat options. ? More whole grains, fruits, and vegetables.  Regular physical activity. This may include aerobic activity (cardio) and strength training.  Medicine to help you lose weight. Your health care provider may prescribe medicine if you are unable to lose 1 pound a week after 6 weeks of eating more healthily and doing more physical activity.  Surgery. Surgical options may include gastric banding and gastric bypass. Surgery may be done if: ? Other treatments have not helped to improve your condition. ? You have a BMI of 40 or higher. ? You have life-threatening health problems related to obesity.  Follow these instructions at home:  Eating and drinking   Follow recommendations from your health care provider about what you eat and drink. Your health care provider may advise you to: ? Limit fast foods, sweets, and processed snack foods. ? Choose low-fat options, such as low-fat milk instead of whole milk. ? Eat 5 or more servings of fruits or vegetables every day. ? Eat at home more often. This gives you more control over what you eat. ? Choose healthy foods when you eat out. ? Learn what a healthy portion size is. ? Keep low-fat snacks on hand. ? Avoid sugary drinks, such as soda, fruit juice, iced tea sweetened with sugar, and flavored milk. ? Eat a healthy  breakfast.  Drink enough water to keep your urine clear or pale yellow.  Do not go without eating for long periods of time (do not fast) or follow a fad diet. Fasting and fad diets can be unhealthy and even dangerous. Physical Activity  Exercise regularly, as told by your health care provider. Ask your health care provider what types of exercise are safe for you and how often you should exercise.  Warm up and stretch before being active.  Cool down and stretch after being active.  Rest between periods of activity. Lifestyle  Limit the time that you spend in front of your TV, computer, or video game system.  Find ways to reward yourself that do not involve food.  Limit alcohol intake to no more than 1 drink a day for nonpregnant women and 2 drinks a day for men. One drink equals 12 oz of beer, 5 oz of wine, or 1 oz of hard liquor. General instructions  Keep a weight loss journal to keep track of the food you eat and how much you exercise you get.  Take over-the-counter and prescription medicines only as told by your health care provider.  Take vitamins and supplements only as told by your health  care provider.  Consider joining a support group. Your health care provider may be able to recommend a support group.  Keep all follow-up visits as told by your health care provider. This is important. Contact a health care provider if:  You are unable to meet your weight loss goal after 6 weeks of dietary and lifestyle changes. This information is not intended to replace advice given to you by your health care provider. Make sure you discuss any questions you have with your health care provider. Document Released: 04/09/2004 Document Revised: 08/05/2015 Document Reviewed: 12/19/2014 Elsevier Interactive Patient Education  2018 [place].  Preventing Unhealthy [employer], Adult Staying at a healthy weight is important. When fat builds up in your body, you may become overweight or  obese. These conditions put you at greater risk for developing certain health problems, such as heart disease, diabetes, sleeping problems, joint problems, and some cancers. Unhealthy weight gain is often the result of making unhealthy choices in what you eat. It is also a result of not getting enough exercise. You can make changes to your lifestyle to prevent obesity and stay as healthy as possible. What nutrition changes can be made? To maintain a healthy weight and prevent obesity:  Eat only as much as your body needs. To do this: ? Pay attention to signs that you are hungry or full. Stop eating as soon as you feel full. ? If you feel hungry, try drinking water first. Drink enough water so your urine is clear or pale yellow. ? Eat smaller portions. ? Look at serving sizes on food labels. Most foods contain more than one serving per container. ? Eat the recommended amount of calories for your gender and activity level. While most active people should eat around 2,000 calories per day, if you are trying to lose weight or are not very active, you main need to eat less calories. Talk to your health care provider or dietitian about how many calories you should eat each day.  Choose healthy foods, such as: ? Fruits and vegetables. Try to fill at least half of your plate at each meal with fruits and vegetables. ? Whole grains, such as whole wheat bread, brown rice, and quinoa. ? Lean meats, such as chicken or fish. ? Other healthy proteins, such as beans, eggs, or tofu. ? Healthy fats, such as nuts, seeds, fatty fish, and olive oil. ? Low-fat or fat-free dairy.  Check food labels and avoid food and drinks that: ? Are high in calories. ? Have added sugar. ? Are high in sodium. ? Have saturated fats or trans fats.  Limit how much you eat of the following foods: ? Prepackaged meals. ? Fast food. ? Fried foods. ? Processed meat, such as bacon, sausage, and deli meats. ? Fatty cuts of red meat  and poultry with skin.  Cook foods in healthier ways, such as by baking, broiling, or grilling.  When grocery shopping, try to shop around the outside of the store. This helps you buy mostly fresh foods and avoid canned and prepackaged foods.  What lifestyle changes can be made?  Exercise at least 30 minutes 5 or more days each week. Exercising includes brisk walking, yard work, biking, running, swimming, and team sports like basketball and soccer. Ask your health care provider which exercises are safe for you.  Do not use any products that contain nicotine or tobacco, such as cigarettes and e-cigarettes. If you need help quitting, ask your health care provider.  Limit  alcohol intake to no more than 1 drink a day for nonpregnant women and 2 drinks a day for men. One drink equals 12 oz of beer, 5 oz of wine, or 1 oz of hard liquor.  Try to get 7-9 hours of sleep each night. What other changes can be made?  Keep a food and activity journal to keep track of: ? What you ate and how many calories you had. Remember to count sauces, dressings, and side dishes. ? Whether you were active, and what exercises you did. ? Your calorie, weight, and activity goals.  Check your weight regularly. Track any changes. If you notice you have gained weight, make changes to your diet or activity routine.  Avoid taking weight-loss medicines or supplements. Talk to your health care provider before starting any new medicine or supplement.  Talk to your health care provider before trying any new diet or exercise plan. Why are these changes important? Eating healthy, staying active, and having healthy habits not only help prevent obesity, they also:  Help you to manage stress and emotions.  Help you to connect with friends and family.  Improve your self-esteem.  Improve your sleep.  Prevent long-term health problems.  What can happen if changes are not made? Being obese or overweight can cause you to  develop joint or bone problems, which can make it hard for you to stay active or do activities you enjoy. Being obese or overweight also puts stress on your heart and lungs and can lead to health problems like diabetes, heart disease, and some cancers. Where to find more information: Talk with your health care provider or a dietitian about healthy eating and healthy lifestyle choices. You may also find other information through these resources:  U.S. Department of Agriculture MyPlate: FormerBoss.no  American Heart Association: www.heart.org  Centers for Disease Control and Prevention: http://www.wolf.info/  Summary  Staying at a healthy weight is important. It helps prevent certain diseases and health problems, such as heart disease, diabetes, joint problems, sleep disorders, and some cancers.  Being obese or overweight can cause you to develop joint or bone problems, which can make it hard for you to stay active or do activities you enjoy.  You can prevent unhealthy weight gain by eating a healthy diet, exercising regularly, not smoking, limiting alcohol, and getting enough sleep.  Talk with your health care provider or a dietitian for guidance about healthy eating and healthy lifestyle choices. This information is not intended to replace advice given to you by your health care provider. Make sure you discuss any questions you have with your health care provider. Document Released: 03/03/2016 Document Revised: 04/08/2016 Document Reviewed: 04/08/2016 Elsevier Interactive Patient Education  [employer].

## 2018-01-31 ENCOUNTER — Encounter: Payer: Self-pay | Admitting: *Deleted

## 2018-02-24 ENCOUNTER — Other Ambulatory Visit: Payer: Self-pay | Admitting: Family Medicine

## 2018-02-24 NOTE — Telephone Encounter (Signed)
Pt came in for labs today and would like to have her thyroid medication sent in once the results come back to [facility] in [place].

## 2018-02-25 LAB — CBC WITH DIFFERENTIAL/PLATELET
BASOS ABS: 40 {cells}/uL (ref 0–200)
Basophils Relative: 0.8 %
Eosinophils Absolute: 160 cells/uL (ref 15–500)
Eosinophils Relative: 3.2 %
HEMATOCRIT: 40 % (ref 35.0–45.0)
Hemoglobin: 13.3 g/dL (ref 11.7–15.5)
Lymphs Abs: 1940 cells/uL (ref 850–3900)
MCH: 26.6 pg — ABNORMAL LOW (ref 27.0–33.0)
MCHC: 33.3 g/dL (ref 32.0–36.0)
MCV: 80 fL (ref 80.0–100.0)
MPV: 10.7 fL (ref 7.5–12.5)
Monocytes Relative: 8.2 %
Neutro Abs: 2450 cells/uL (ref 1500–7800)
Neutrophils Relative %: 49 %
Platelets: 238 10*3/uL (ref 140–400)
RBC: 5 10*6/uL (ref 3.80–5.10)
RDW: 13.2 % (ref 11.0–15.0)
Total Lymphocyte: 38.8 %
WBC mixed population: 410 cells/uL (ref 200–950)
WBC: 5 10*3/uL (ref 3.8–10.8)

## 2018-02-25 LAB — COMPLETE METABOLIC PANEL WITH GFR
AG Ratio: 1.5 (calc) (ref 1.0–2.5)
ALBUMIN MSPROF: 4 g/dL (ref 3.6–5.1)
ALKALINE PHOSPHATASE (APISO): 93 U/L (ref 33–130)
ALT: 16 U/L (ref 6–29)
AST: 15 U/L (ref 10–35)
BILIRUBIN TOTAL: 0.2 mg/dL (ref 0.2–1.2)
BUN: 18 mg/dL (ref 7–25)
CHLORIDE: 105 mmol/L (ref 98–110)
CO2: 27 mmol/L (ref 20–32)
Calcium: 9.2 mg/dL (ref 8.6–10.4)
Creat: 0.82 mg/dL (ref 0.50–1.05)
GFR, Est African American: 91 mL/min/{1.73_m2} (ref >=60)
GFR, Est Non African American: 79 mL/min/{1.73_m2} (ref >=60)
GLUCOSE: 107 mg/dL — AB (ref 65–99)
Globulin: 2.7 g/dL (calc) (ref 1.9–3.7)
Potassium: 4.5 mmol/L (ref 3.5–5.3)
Sodium: 139 mmol/L (ref 135–146)
Total Protein: 6.7 g/dL (ref 6.1–8.1)

## 2018-02-25 LAB — TSH: TSH: 2.44 mIU/L (ref 0.40–4.50)

## 2018-02-25 LAB — LIPID PANEL
CHOL/HDL RATIO: 4.4 (calc) (ref <5.0)
Cholesterol: 216 mg/dL — ABNORMAL HIGH (ref <200)
HDL: 49 mg/dL — ABNORMAL LOW (ref >50)
LDL CHOLESTEROL (CALC): 139 mg/dL — AB
NON-HDL CHOLESTEROL (CALC): 167 mg/dL — AB (ref <130)
TRIGLYCERIDES: 150 mg/dL — AB (ref <150)

## 2018-02-25 LAB — HEPATITIS C ANTIBODY
HEP C AB: NONREACTIVE
SIGNAL TO CUT-OFF: 0.18 (ref <1.00)

## 2018-02-26 MED ORDER — LEVOTHYROXINE SODIUM 112 MCG PO TABS: ORAL_TABLET | ORAL | 3 refills | Status: DC

## 2018-02-26 NOTE — Telephone Encounter (Signed)
Lab Results  Component Value Date   TSH 2.44 02/24/2018   Okay for Rx

## 2018-03-03 ENCOUNTER — Other Ambulatory Visit: Payer: Self-pay | Admitting: Family Medicine

## 2018-03-03 DIAGNOSIS — E785 Hyperlipidemia, unspecified: Secondary | ICD-10-CM

## 2018-03-03 NOTE — Progress Notes (Signed)
Diet, weight loss, activity; recheck lipids in 3 months

## 2018-04-26 DIAGNOSIS — L239 Allergic contact dermatitis, unspecified cause: Secondary | ICD-10-CM | POA: Diagnosis not present

## 2018-04-26 DIAGNOSIS — R21 Rash and other nonspecific skin eruption: Secondary | ICD-10-CM | POA: Diagnosis not present

## 2018-04-29 DIAGNOSIS — M25572 Pain in left ankle and joints of left foot: Secondary | ICD-10-CM | POA: Diagnosis not present

## 2018-05-05 ENCOUNTER — Encounter: Payer: Self-pay | Admitting: Family Medicine

## 2018-05-30 ENCOUNTER — Ambulatory Visit: Payer: Commercial Managed Care - PPO | Admitting: Obstetrics & Gynecology

## 2018-06-01 NOTE — Progress Notes (Signed)
Gynecology Annual Exam  PCP: Arnetha Courser, MD  Chief Complaint:  Chief Complaint  Patient presents with  . Gynecologic Exam    pain on right lower abdomen x couple of weeks     History of Present Illness:ANNUAL EXAM:  58 year old Caucasian/White female is here for annual exam. She also complains of an intermittent RLQ/ groin pain x 2 weeks. The pain can be dull and achy or sharp. She rates the pain 4-5/10. The pain usually lasts an hour or less. Bending over makes the pain worse, nothing makes it better. Denies fever, dysuria, hematuria, diarrhea or constipation with the pain. She had similar complaints 10/2016 and had an ultrasound which showed a small fibroid. No adnexal masses were seen. Has a concern for ovarian cancer as two sisters had ovarian cancer. Genetic testing has not been done.  She is menopausal. and is not taking HRT.  Hx of endometrial ablation in 2011 with  no periods since then Past medical history is remarkable for hypertension, hypothyroidism, eczema, anxiety disorder.  PAP History:  Her last PAP smear was 09/06/2015 and was NIL/negative Mammogram:  She denies breast symptoms. The patient performs breast self-exams sporadically. Her most recent mammogram was 07/05/2017 and was negative. There is a family history of breast cancer in her maternal aunt and paternal aunt. Genetic testing has not been done Osteoporosis prevention:  She is currently using nothing for osteoporosis prevention. She does not exercise. Probably get adequate calcium and vitamin D3 in diet.. A bone density scan has not been previously obtained.   Colon cancer screening: Last colonoscopy was 2014 and was negative. Was told she needed another one in 5 years?  Last lipid panel in 02/2018 was borderline. History of low vitamin D3   The patient denies smoking.  She denies drinking.alcohol.   She denies illegal drug use.  The patient does not exercise.  The patient denies current  symptoms of depression.    Review of Systems: Review of Systems  Constitutional: Negative for chills, fever and weight loss.  HENT: Negative for congestion, sinus pain and sore throat.   Eyes: Negative for blurred vision and pain.  Respiratory: Negative for hemoptysis, shortness of breath and wheezing.   Cardiovascular: Negative for chest pain, palpitations and leg swelling.  Gastrointestinal: Positive for abdominal pain (RLQ). Negative for blood in stool, diarrhea, heartburn, nausea and vomiting.  Genitourinary: Negative for dysuria, frequency, hematuria and urgency.  Musculoskeletal: Negative for back pain, joint pain and myalgias.  Skin: Positive for rash (allergy to gloves). Negative for itching.  Neurological: Negative for dizziness, tingling and headaches.  Endo/Heme/Allergies: Positive for environmental allergies. Negative for polydipsia. Does not bruise/bleed easily.       Negative for hirsutism   Psychiatric/Behavioral: Negative for depression. The patient is not nervous/anxious and does not have insomnia.     Past Medical History:  Past Medical History:  Diagnosis Date  . Anemia    H/O  . Anxiety   . Complication of anesthesia   . Family history of breast cancer    cancer genetic testing letter sent 8/18  . Family history of ovarian cancer    cancer genetic testing letter sent 8/18  . GERD (gastroesophageal reflux disease)    NO MEDS  . Hyperlipidemia   . Hypertension   . Hypothyroidism    Hashimoto's per patient  . IFG (impaired fasting glucose) 08/05/2015  . Metabolic syndrome 1/96/2229  . Palpitations 2012   Evaluated by Christia Reading  Rockey Situ, MD, Holter moniter  . PONV (postoperative nausea and vomiting)   . Rectal mass   . Thyroid disease    unspecified hypothyroidism    Past Surgical History:  Past Surgical History:  Procedure Laterality Date  . ANAL FISTULOTOMY N/A 11/30/2014   Procedure: ANAL FISTULOTOMY;  Surgeon: Dia Crawford III, MD;  Location: [facility] ORS;   Service: General;  Laterality: N/A;  . CESAREAN SECTION    . COLONOSCOPY  2014  . DILATION AND CURETTAGE OF UTERUS    . ENDOMETRIAL ABLATION W/ NOVASURE  05/29/2009   Dr Kenton Kingfisher  . left foot surgery    . RECTAL EXAM UNDER ANESTHESIA N/A 11/30/2014   Procedure: RECTAL EXAM UNDER ANESTHESIA;  Surgeon: Dia Crawford III, MD;  Location: [facility] ORS;  Service: General;  Laterality: N/A;    Family History:  Family History  Problem Relation Age of Onset  . Hypertension Mother   . Heart disease Mother 70       "blockages"  . Alzheimer's disease Mother   . Diverticulitis Father   . Hernia Father   . Depression Father   . Alzheimer's disease Father   . Hypothyroidism Sister   . Uterine cancer Sister 74  . Ovarian cancer Sister   . Breast cancer Maternal Aunt 76  . Hypothyroidism Daughter   . Hypertension Brother   . Stroke Maternal Grandfather   . Heart attack Maternal Grandfather   . Stroke Paternal Grandmother   . Hypothyroidism Sister   . Ovarian cancer Sister   . Hypothyroidism Daughter   . Breast cancer Paternal Aunt 24  . Breast cancer Other 30    Social History:  Social History   Socioeconomic History  . Marital status: Married    Spouse name: Herbie Baltimore A. Dant  . Number of children: 3  . Years of education: Not on file  . Highest education level: Associate degree: occupational, Hotel manager, or vocational program  Occupational History  . Occupation: Insurance underwriter Pediatrics  Social Needs  . Financial resource strain: Not hard at all  . Food insecurity:    Worry: Never true    Inability: Never true  . Transportation needs:    Medical: No    Non-medical: No  Tobacco Use  . Smoking status: Never Smoker  . Smokeless tobacco: Never Used  Substance and Sexual Activity  . Alcohol use: No    Alcohol/week: 0.0 standard drinks  . Drug use: No  . Sexual activity: Yes    Birth control/protection: Post-menopausal  Lifestyle  . Physical activity:    Days per week: 0 days     Minutes per session: 0 min  . Stress: Not at all  Relationships  . Social connections:    Talks on phone: More than three times a week    Gets together: More than three times a week    Attends religious service: Never    Active member of club or organization: No    Attends meetings of clubs or organizations: Never    Relationship status: Married  . Intimate partner violence:    Fear of current or ex partner: No    Emotionally abused: No    Physically abused: No    Forced sexual activity: No  Other Topics Concern  . Not on file  Social History Narrative  . Not on file    Allergies:  Allergies  Allergen Reactions  . Scopolamine Other (See Comments)    "[place]"    Medications: Current  Outpatient Medications on File Prior to Visit  Medication Sig Dispense Refill  . aspirin EC 81 MG tablet Take 81 mg by mouth 3 (three) times a week.     . clobetasol cream (TEMOVATE) 0.05 % APPLY TO AFFECTED AREA EVERY DAY    . levothyroxine (SYNTHROID) 112 MCG tablet TAKE ONE TABLET DAILY. EVERY SUNDAY TAKE AN EXTRA ONE-HALF TABLET. 90 tablet 3  . losartan (COZAAR) 50 MG tablet TAKE 1.5 TABLETS (75 MG TOTAL) BY MOUTH DAILY. 135 tablet 1  . triamcinolone ointment (KENALOG) 0.1 % Apply 1 application topically 2 (two) times daily. 80 g 1   No current facility-administered medications on file prior to visit.       Physical Exam Vitals: BP (!) 150/80   Ht 5\' 7"  (1.702 m)   Wt 211 lb (95.7 kg)   BMI 33.05 kg/m   General: WF in NAD HEENT: normocephalic, anicteric Neck: no thyroid enlargement, no palpable nodules, no cervical lymphadenopathy  Pulmonary: No increased work of breathing, CTAB Cardiovascular: RRR, without murmur  Breast: Breast symmetrical, no tenderness, no palpable nodules or masses, no skin or nipple retraction present, no nipple discharge.  No axillary, infraclavicular or supraclavicular lymphadenopathy. Abdomen: Soft, non-tender, non-distended.   Umbilicus without lesions.  No hepatomegaly or masses palpable. No evidence of hernia. Vertical scar on lower abdomen. Genitourinary:  External: Normal external female genitalia.  Normal urethral meatus, normal  Bartholin's and Skene's glands.    Vagina: Normal vaginal mucosa, no evidence of prolapse.    Cervix: Grossly normal in appearance, no bleeding, non-tender  Uterus: Anteverted, normal size, shape, and consistency, mobile, and non-tender  Adnexa: No adnexal masses, non-tender  Rectal: deferred  Lymphatic: no evidence of inguinal lymphadenopathy Extremities: no edema, erythema, or tenderness Neurologic: Grossly intact Psychiatric: mood appropriate, affect full     Assessment: 58 y.o. [id number] for annual gyn exam RLQ pain, chronic, intermittent Family hx of breast and ovarian cancer  Plan:    1) Breast cancer screening - recommend monthly self breast exam. Mammogram was ordered today. Patient to schedule after 07/06/2018  2) Pelvic ultrasound ordered for RLQ pain. Discussed family history of ovarian and breast cancer. Encouraged genetic testing. Would like to have testing done when she returns for the ultrasound.  3) Cervical cancer screening - Pap was done. ASCCP guidelines and rational discussed.  Patient opts for every 3 years screening interval  4) Colon cancer screening: explained colon cancer screening options. Would prefer to do Cologuard. Order sent.  5) Routine healthcare maintenance including cholesterol and diabetes screening managed by PCP   Dalia Heading, CNM

## 2018-06-02 ENCOUNTER — Other Ambulatory Visit: Payer: Self-pay

## 2018-06-02 ENCOUNTER — Other Ambulatory Visit (HOSPITAL_COMMUNITY)
Admission: RE | Admit: 2018-06-02 | Discharge: 2018-06-02 | Disposition: A | Discharge status: Home / Self Care | Payer: Commercial Managed Care - PPO | Source: Ambulatory Visit | Attending: Obstetrics & Gynecology | Admitting: Obstetrics & Gynecology

## 2018-06-02 ENCOUNTER — Ambulatory Visit (INDEPENDENT_AMBULATORY_CARE_PROVIDER_SITE_OTHER): Payer: Commercial Managed Care - PPO | Admitting: Certified Nurse Midwife

## 2018-06-02 ENCOUNTER — Encounter: Payer: Self-pay | Admitting: Certified Nurse Midwife

## 2018-06-02 VITALS — BP 150/80 | Ht 67.0 in | Wt 211.0 lb

## 2018-06-02 DIAGNOSIS — Z124 Encounter for screening for malignant neoplasm of cervix: Secondary | ICD-10-CM

## 2018-06-02 DIAGNOSIS — Z01419 Encounter for gynecological examination (general) (routine) without abnormal findings: Secondary | ICD-10-CM | POA: Insufficient documentation

## 2018-06-02 DIAGNOSIS — G8929 Other chronic pain: Secondary | ICD-10-CM

## 2018-06-02 DIAGNOSIS — Z803 Family history of malignant neoplasm of breast: Secondary | ICD-10-CM

## 2018-06-02 DIAGNOSIS — Z8041 Family history of malignant neoplasm of ovary: Secondary | ICD-10-CM | POA: Insufficient documentation

## 2018-06-02 DIAGNOSIS — R1031 Right lower quadrant pain: Secondary | ICD-10-CM

## 2018-06-02 DIAGNOSIS — Z1239 Encounter for other screening for malignant neoplasm of breast: Secondary | ICD-10-CM

## 2018-06-02 DIAGNOSIS — Z1211 Encounter for screening for malignant neoplasm of colon: Secondary | ICD-10-CM

## 2018-06-06 LAB — CYTOLOGY - PAP
Diagnosis: NEGATIVE
HPV: NOT DETECTED

## 2018-06-07 ENCOUNTER — Encounter: Payer: Self-pay | Admitting: Family Medicine

## 2018-06-07 ENCOUNTER — Other Ambulatory Visit: Payer: Self-pay

## 2018-06-07 ENCOUNTER — Ambulatory Visit: Payer: Commercial Managed Care - PPO | Admitting: Family Medicine

## 2018-06-07 ENCOUNTER — Ambulatory Visit: Payer: Self-pay | Admitting: Family Medicine

## 2018-06-07 VITALS — BP 128/76 | HR 84 | Temp 98.4°F | Resp 16 | Ht 67.0 in | Wt 212.4 lb

## 2018-06-07 DIAGNOSIS — E063 Autoimmune thyroiditis: Secondary | ICD-10-CM | POA: Diagnosis not present

## 2018-06-07 DIAGNOSIS — J302 Other seasonal allergic rhinitis: Secondary | ICD-10-CM | POA: Diagnosis not present

## 2018-06-07 DIAGNOSIS — F418 Other specified anxiety disorders: Secondary | ICD-10-CM | POA: Diagnosis not present

## 2018-06-07 MED ORDER — FLUTICASONE PROPIONATE 50 MCG/ACT NA SUSP: 2.0000 | Freq: Every day | NASAL | 6 refills | Status: DC

## 2018-06-07 MED ORDER — BUSPIRONE HCL 7.5 MG PO TABS: ORAL_TABLET | ORAL | 0 refills | Status: DC

## 2018-06-07 NOTE — Progress Notes (Signed)
Name: Desiree Walker   MRN: [id number]    DOB: 23-Mar-1960   Date:06/07/2018       Progress Note  Subjective  Chief Complaint  Chief Complaint  Patient presents with  . Follow-up    HPI  Pt presents with concerns over COVID-19 and her Hashimoto's hypothyroidism (has had since her late 68's). 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 She wants FMLA while the COVID-19 virus pandemic is ongoing.  Lengthy discussion regarding her risk for COVID-19 - she has hashimoto's and anxiety.  Did discuss medication options for her anxiety that can be temporary for the time being.  Rhinitis: Nasal congestion, has a very mild intermittent cough - worse at night.  Has seasonal allergies and has not been taking antihistamine.  Will restart Zyrtec this evening, and we will rx flonase.   Patient Active Problem List   Diagnosis Date Noted  . Family history of breast cancer 06/02/2018  . Family history of ovarian cancer 06/02/2018  . Eczema 06/10/2017  . Preventative health care 01/15/2017  . History of uterine fibroid 10/23/2016  . Metabolic syndrome 62/94/7654  . Medication monitoring encounter 10/13/2016  . Pressure in chest 12/17/2015  . Anxiety disorder 12/17/2015  . Obesity 12/17/2015  . Sleep apnea 08/29/2015  . Snoring 08/27/2015  . Vitamin D deficiency 08/19/2015  . Leg cramps 08/05/2015  . IFG (impaired fasting glucose) 08/05/2015  . Anal fistula   . Other specified symptoms and signs involving the digestive system and abdomen 10/31/2014  . 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 Bladder neoplasm of uncertain malignant potential 06/09/2013  . Female genuine stress incontinence 06/05/2013  . Incomplete bladder emptying 06/05/2013  . Urge incontinence 06/05/2013  . Dysuria 06/05/2013  . Other chronic cystitis without hematuria 06/05/2013  . Retention of urine  06/05/2013  . Right lower quadrant pain 07/27/2012  . Palpitations 09/11/2010  . Hypothyroidism, adult 05/10/2009  . Hyperlipidemia LDL goal <130 05/10/2009  . Hypertension goal BP (blood pressure) < 140/90 05/10/2009    Social History   Tobacco Use  . Smoking status: Never Smoker  . Smokeless tobacco: Never Used  Substance Use Topics  . Alcohol use: No    Alcohol/week: 0.0 standard drinks     Current Outpatient Medications:  .  aspirin EC 81 MG tablet, Take 81 mg by mouth 3 (three) times a week. , Disp: , Rfl:  .  clobetasol cream (TEMOVATE) 0.05 %, APPLY TO AFFECTED AREA EVERY DAY, Disp: , Rfl:  .  levothyroxine (SYNTHROID) 112 MCG tablet, TAKE ONE TABLET DAILY. EVERY SUNDAY TAKE AN EXTRA ONE-HALF TABLET., Disp: 90 tablet, Rfl: 3 .  losartan (COZAAR) 50 MG tablet, TAKE 1.5 TABLETS (75 MG TOTAL) BY MOUTH DAILY., Disp: 135 tablet, Rfl: 1 .  triamcinolone ointment (KENALOG) 0.1 %, Apply 1 application topically 2 (two) times daily. (Patient not taking: Reported on 06/07/2018), Disp: 80 g, Rfl: 1  Allergies  Allergen Reactions  . Scopolamine Other (See Comments)    "Olivet"    I personally reviewed active problem list, medication list, allergies, health maintenance, lab results with the patient/caregiver today.  ROS  Constitutional: Negative for fever or weight change.  Respiratory: Positive for cough for about 1 week - has a lot of nasal congestion and attributes it to PND. No shortness of breath.   Cardiovascular: Negative for  chest pain or palpitations.  Gastrointestinal: Negative for abdominal pain, no bowel changes.  Musculoskeletal: Negative for gait problem or joint swelling.  Skin: Negative for rash.  Neurological: Negative for dizziness or headache.  No other specific complaints in a complete review of systems (except as listed in HPI above).  Objective  Vitals:   06/07/18 0908  BP: 128/76  Pulse: 84  Resp: 16  Temp: 98.4 F (36.9 C)   TempSrc: Oral  SpO2: 98%  Weight: 212 lb 6.4 oz (96.3 kg)  Height: 5\' 7"  (1.702 m)   Body mass index is 33.27 kg/m.  Nursing Note and Vital Signs reviewed.  Physical Exam  Constitutional: Patient appears well-developed and well-nourished. No distress.  HENT: Head: Normocephalic and atraumatic. Eyes: Conjunctivae and EOM are normal. No scleral icterus. Nares are WNL without boggy turbinates. Neck: Normal range of motion. Neck supple, no lymphadenopathy. No JVD present. No thyromegaly present.  Cardiovascular: Normal rate, regular rhythm and normal heart sounds.  No murmur heard. No BLE edema. Pulmonary/Chest: Effort normal and breath sounds normal. No respiratory distress.  There is no cough throughout the approx 79min appointment. Musculoskeletal: Normal range of motion, no joint effusions. No gross deformities Neurological: Pt is alert and oriented to person, place, and time. No cranial nerve deficit. Coordination, balance, strength, speech and gait are normal.  Skin: Skin is warm and dry. No rash noted. No erythema.  Psychiatric: Patient has anxious mood and affect. behavior is normal. Judgment and thought content normal.  No results found for this or any previous visit (from the past 72 hour(s)).  Assessment & Plan  1. Autoimmune lymphocytic chronic thyroiditis - Very lengthy discussion regarding her request for FMLA, education on Hashimoto's and the CDC recommendations for high risk category.  Did discuss mitigation strategies for COVID-19 in great detail and answered all questions asked.  2. Other specified anxiety disorders - busPIRone (BUSPAR) 7.5 MG tablet; Take 1 tablet once daily at night; may take 1 additional tab once daily as needed for anxiety.  Dispense: 90 tablet; Refill: 0  3. Seasonal allergic rhinitis, unspecified trigger - Restart Zyrtec this evening. - fluticasone (FLONASE) 50 MCG/ACT nasal spray; Place 2 sprays into both nostrils daily.  Dispense: 16 g;  Refill: 6  -Red flags and when to present for emergency care or RTC including fever >101.38F, chest pain, shortness of breath, new/worsening/un-resolving symptoms, reviewed with patient at time of visit. Follow up and care instructions discussed and provided in AVS.  - Face-to-face time with patient was more than 25 minutes, >50% time spent counseling and coordination of care

## 2018-06-07 NOTE — Telephone Encounter (Signed)
Pt called in and was  Talking with an agent when she got disconnected.   When she called back the call was connected to the nurse call phone tree instead of going to an agent.  I assisted her.   She was questioning whether to cancel her appt with Raelyn Ensign at the [facility] or do a virtual visit that she got a message about.   After answering many of her questions she has decided to keep her appt.   She would prefer a face to face encounter.   So appt kept.  She thanked me for my help.

## 2018-06-07 NOTE — Patient Instructions (Signed)
Coronavirus (COVID-19) Are you at risk?  Are you at risk for the Coronavirus (COVID-19)?  To be considered HIGH RISK for Coronavirus (COVID-19), you have to meet the following criteria:  . Traveled to [place], [place], [place], [place] or [place]; or in the [place] to [place], [place], [place], or [place]; and have fever, cough, and shortness of breath within the last 2 weeks of travel OR . Been in close contact with a person diagnosed with COVID-19 within the last 2 weeks and have fever, cough, and shortness of breath . IF YOU DO NOT MEET THESE CRITERIA, YOU ARE CONSIDERED LOW RISK FOR COVID-19.  What to do if you are HIGH RISK for COVID-19?  . If you are having a medical emergency, call 911. . Seek medical care right away. Before you go to a doctor's office, urgent care or emergency department, call ahead and tell them about your recent travel, contact with someone diagnosed with COVID-19, and your symptoms. You should receive instructions from your physician's office regarding next steps of care.  . When you arrive at healthcare provider, tell the healthcare staff immediately you have returned from visiting [place], [place], [place], [place] or [place]; or traveled in the [place] to [place], [place], [place], or [place]; in the last two weeks or you have been in close contact with a person diagnosed with COVID-19 in the last 2 weeks.   . Tell the health care staff about your symptoms: fever, cough and shortness of breath. . After you have been seen by a medical provider, you will be either: o Tested for (COVID-19) and discharged home on quarantine except to seek medical care if symptoms worsen, and asked to  - Stay home and avoid contact with others until you get your results (4-5 days)  - Avoid travel on public transportation if possible (such as bus, train, or airplane) or o Sent to the Emergency Department by EMS for evaluation, COVID-19 testing, and possible  admission depending on your condition and test results.  What to do if you are LOW RISK for COVID-19?  Reduce your risk of any infection by using the same precautions used for avoiding the common cold or flu:  . Wash your hands often with soap and warm water for at least 20 seconds.  If soap and water are not readily available, use an alcohol-based hand sanitizer with at least 60% alcohol.  . If coughing or sneezing, cover your mouth and nose by coughing or sneezing into the elbow areas of your shirt or coat, into a tissue or into your sleeve (not your hands). . Avoid shaking hands with others and consider head nods or verbal greetings only. . Avoid touching your eyes, nose, or mouth with unwashed hands.  . Avoid close contact with people who are sick. . Avoid places or events with large numbers of people in one location, like concerts or sporting events. . Carefully consider travel plans you have or are making. . If you are planning any travel outside or inside the [place], visit the CDC's Travelers' Health webpage for the latest health notices. . If you have some symptoms but not all symptoms, continue to monitor at home and seek medical attention if your symptoms worsen. . If you are having a medical emergency, call 911.   ADDITIONAL HEALTHCARE OPTIONS FOR PATIENTS  [facility] / e-Visit: [email or web address]         [facility]: [phone]  [facility]: [phone]                   [facility]: [phone]   

## 2018-06-15 DIAGNOSIS — E038 Other specified hypothyroidism: Secondary | ICD-10-CM | POA: Diagnosis not present

## 2018-06-15 DIAGNOSIS — E063 Autoimmune thyroiditis: Secondary | ICD-10-CM | POA: Diagnosis not present

## 2018-06-21 ENCOUNTER — Other Ambulatory Visit: Payer: Self-pay | Admitting: Family Medicine

## 2018-06-21 DIAGNOSIS — F418 Other specified anxiety disorders: Secondary | ICD-10-CM

## 2018-06-22 ENCOUNTER — Other Ambulatory Visit: Payer: Self-pay | Admitting: Family Medicine

## 2018-06-22 ENCOUNTER — Ambulatory Visit: Payer: Commercial Managed Care - PPO | Admitting: Certified Nurse Midwife

## 2018-06-22 ENCOUNTER — Other Ambulatory Visit: Payer: Commercial Managed Care - PPO

## 2018-06-22 DIAGNOSIS — F418 Other specified anxiety disorders: Secondary | ICD-10-CM

## 2018-07-04 ENCOUNTER — Other Ambulatory Visit: Payer: Self-pay

## 2018-07-04 DIAGNOSIS — F418 Other specified anxiety disorders: Secondary | ICD-10-CM

## 2018-07-04 MED ORDER — BUSPIRONE HCL 7.5 MG PO TABS: ORAL_TABLET | ORAL | 0 refills | Status: DC

## 2018-07-04 NOTE — Telephone Encounter (Signed)
Refill request for general medication: Buspirone HCL 7.5 mg  Last office visit: 06/07/2018  Follow-ups on file. 07/08/2018

## 2018-07-07 DIAGNOSIS — R21 Rash and other nonspecific skin eruption: Secondary | ICD-10-CM | POA: Diagnosis not present

## 2018-07-07 DIAGNOSIS — Z79899 Other long term (current) drug therapy: Secondary | ICD-10-CM | POA: Diagnosis not present

## 2018-07-07 DIAGNOSIS — L2389 Allergic contact dermatitis due to other agents: Secondary | ICD-10-CM | POA: Diagnosis not present

## 2018-07-08 ENCOUNTER — Ambulatory Visit: Payer: Commercial Managed Care - PPO | Admitting: Family Medicine

## 2018-07-14 ENCOUNTER — Other Ambulatory Visit: Payer: Self-pay | Admitting: Family Medicine

## 2018-07-14 DIAGNOSIS — F418 Other specified anxiety disorders: Secondary | ICD-10-CM

## 2018-07-21 ENCOUNTER — Ambulatory Visit: Payer: Commercial Managed Care - PPO

## 2018-07-21 ENCOUNTER — Ambulatory Visit: Payer: Commercial Managed Care - PPO | Admitting: Obstetrics & Gynecology

## 2018-07-26 ENCOUNTER — Other Ambulatory Visit: Payer: Commercial Managed Care - PPO

## 2018-07-26 ENCOUNTER — Ambulatory Visit: Payer: Commercial Managed Care - PPO | Admitting: Obstetrics & Gynecology

## 2018-07-27 ENCOUNTER — Other Ambulatory Visit: Payer: Self-pay | Admitting: Emergency Medicine

## 2018-07-27 DIAGNOSIS — F418 Other specified anxiety disorders: Secondary | ICD-10-CM

## 2018-08-05 ENCOUNTER — Telehealth: Payer: Self-pay

## 2018-08-05 NOTE — Telephone Encounter (Signed)
Attempted to call pt to remind her to send in her cologard sample.  [place].

## 2018-08-05 NOTE — Telephone Encounter (Signed)
Pt aware to send in cologard sample.  Pt requested to be tx'd back to appts to sched appt c [facility].  Tx'd to [facility].

## 2018-08-10 ENCOUNTER — Other Ambulatory Visit: Payer: Self-pay | Admitting: Obstetrics & Gynecology

## 2018-08-10 DIAGNOSIS — Z8041 Family history of malignant neoplasm of ovary: Secondary | ICD-10-CM

## 2018-08-10 DIAGNOSIS — R102 Pelvic and perineal pain: Secondary | ICD-10-CM

## 2018-08-11 ENCOUNTER — Ambulatory Visit (INDEPENDENT_AMBULATORY_CARE_PROVIDER_SITE_OTHER): Payer: Commercial Managed Care - PPO

## 2018-08-11 ENCOUNTER — Encounter: Payer: Self-pay | Admitting: Obstetrics & Gynecology

## 2018-08-11 ENCOUNTER — Ambulatory Visit (INDEPENDENT_AMBULATORY_CARE_PROVIDER_SITE_OTHER): Payer: Commercial Managed Care - PPO | Admitting: Obstetrics & Gynecology

## 2018-08-11 ENCOUNTER — Other Ambulatory Visit: Payer: Self-pay

## 2018-08-11 VITALS — BP 130/80 | Ht 67.0 in | Wt 212.0 lb

## 2018-08-11 DIAGNOSIS — R102 Pelvic and perineal pain unspecified side: Secondary | ICD-10-CM

## 2018-08-11 DIAGNOSIS — Z8041 Family history of malignant neoplasm of ovary: Secondary | ICD-10-CM

## 2018-08-11 DIAGNOSIS — Z1239 Encounter for other screening for malignant neoplasm of breast: Secondary | ICD-10-CM | POA: Diagnosis not present

## 2018-08-11 DIAGNOSIS — G8929 Other chronic pain: Secondary | ICD-10-CM

## 2018-08-11 DIAGNOSIS — Z803 Family history of malignant neoplasm of breast: Secondary | ICD-10-CM | POA: Diagnosis not present

## 2018-08-11 DIAGNOSIS — R1031 Right lower quadrant pain: Secondary | ICD-10-CM

## 2018-08-11 DIAGNOSIS — D251 Intramural leiomyoma of uterus: Secondary | ICD-10-CM | POA: Diagnosis not present

## 2018-08-11 NOTE — Progress Notes (Signed)
HPI: Abdominal Pain Patient presents for evaluation of abdominal pain. The pain is described as aching and dull, and is 3/10 in intensity. Pain is located in the RLQ area without radiation. Onset was gradual occurring several months ago. Symptoms have been unchanged since. Aggravating factors: none. Alleviating factors: none. Associated symptoms: none. The patient denies constipation, diarrhea, fever, frequency, nausea and vomiting. Risk factors for pelvic/abdominal pain include none. Family history for ovarian cancer, 2 sisters, also breast cancer (aunts).  Ultrasound demonstrates no adnexal masses.  Small fibroid, 2 cm.  PMHx: She  has a past medical history of Anemia, Anxiety, Complication of anesthesia, Family history of breast cancer, Family history of ovarian cancer, GERD (gastroesophageal reflux disease), Hyperlipidemia, Hypertension, Hypothyroidism, IFG (impaired fasting glucose) (5/45/6256), Metabolic syndrome (3/89/3734), Palpitations (2012), PONV (postoperative nausea and vomiting), Rectal mass, and Thyroid disease. Also,  has a past surgical history that includes Dilation and curettage of uterus; Cesarean section; left foot surgery; Rectal exam under anesthesia (N/A, 11/30/2014); Anal fistulotomy (N/A, 11/30/2014); Endometrial ablation w/ novasure (05/29/2009); and Colonoscopy (2014)., family history includes Alzheimer's disease in her father and mother; Breast cancer (age of onset: 16) in an other family member; Breast cancer (age of onset: 70) in her maternal aunt; Breast cancer (age of onset: 61) in her paternal aunt; Depression in her father; Diverticulitis in her father; Heart attack in her maternal grandfather; Heart disease (age of onset: 25) in her mother; Hernia in her father; Hypertension in her brother and mother; Hypothyroidism in her daughter, daughter, sister, and sister; Ovarian cancer in her sister and sister; Stroke in her maternal grandfather and paternal grandmother; Uterine  cancer (age of onset: 38) in her sister.,  reports that she has never smoked. She has never used smokeless tobacco. She reports that she does not drink alcohol or use drugs.  She has a current medication list which includes the following prescription(s): aspirin ec, levothyroxine, losartan, buspirone, clobetasol cream, and fluticasone. Also, is allergic to scopolamine.  Review of Systems  All other systems reviewed and are negative.   Objective: BP 130/80    Ht 5' 7"  (1.702 m)    Wt 212 lb (96.2 kg)    BMI 33.20 kg/m   Physical examination Constitutional NAD, Conversant  Skin No rashes, lesions or ulceration.   Extremities: Moves all appropriately.  Normal ROM for age. No lymphadenopathy.  Neuro: Grossly intact  Psych: Oriented to PPT.  Normal mood. Normal affect.   Assessment:  Family history of ovarian cancer  - Plan: [place] [facility] - My Risk genetic testing  She presents with a significant personal and/or family history of ovarian and breast cancer. Details of which can be found in her medical/family history. She does not have a previously identified BRCA and Lynch syndrome mutation in her family. Due to her personal and/or family history of cancer she is a candidate for the [facility] test(s).    Risk for cancer, genetic susceptibility discussed.  Patient has requested gene testing.  Discussed BRCA as well as Lynch syndrome and other cancer risk assessments available based on her family history and personal history. Pros and cons of testing discussed.   Chronic RLQ pain No s/sx GYN etiology GI eval or PCP next  Due for [facility] - ordered  A total of 15 minutes were spent face-to-face with the patient during this encounter and over half of that time dealt with counseling and coordination of care.  Barnett Applebaum, MD, [facility], [facility] 08/11/2018  1:57 PM

## 2018-08-11 NOTE — Patient Instructions (Signed)
Multigene Panel Testing for Cancer  What is cancer? Normal cells in the body grow, divide, and are replaced on a routine basis. Sometimes, cells divide abnormally and begin to grow out of control. These cells may form growths or tumors. Tumors can be benign (not cancer) or malignant (cancer). Benign tumors do not spread to other body tissues. Cancer tumors can invade and destroy nearby healthy tissues, bones, and organs. Cancer cells also can spread to other parts of the body and form new cancerous areas.  What causes cancer? Cancer is caused by many different factors. A few types of cancer are caused by changes in genes that can be passed from parent to child. Changes in genes are called mutations. Certain gene mutations are associated with family cancer syndromes. What are family cancer syndromes?  Family cancer syndromes are genetic conditions that increase the risk of certain types of cancer. They also are called hereditary or inherited cancer syndromes. Common family cancer syndromes include hereditary breast and ovarian cancer (HBOC) syndrome, Lynch syndrome, Li-Fraumeni syndrome, Cowden syndrome, and Peutz-Jeghers syndrome. What is genetic testing for cancer? Genetic testing for cancer looks for mutations in certain genes that are known to be linked to cancer. The results can help determine your risk of developing a disease like cancer or passing on a genetic disorder. What is multigene panel testing? Multigene panel testing is a type of genetic testing that looks for mutations in several genes at once. This is different from single-gene testing, which looks for a mutation in a specific gene. Single-gene testing is often used when there is already a known gene mutation in a family. For example, testing for BRCA mutations only looks for changes in BRCA1 and BRCA2 genes. Who should have genetic testing? You may consider genetic testing if your personal or family history shows that you have an  increased risk of cancer. Your obstetrician-gynecologist (ob-gyn) or other health care professional may ask you these and other questions: . Have you or any family members been diagnosed with cancer?  . If yes, which family members were diagnosed, with what types of cancer, and at what ages?  . Were you or any of your family members born with birth defects?  . Are you of Eastern or Central European Jewish ancestry? Depending on your answers, your ob-gyn or other health care professional may suggest that you talk about genetic testing with a genetic counselor or a physician who is an expert in genetics. You can choose to have genetic testing, or you can choose not to. Before you decide, you should have genetic counseling. What is genetic counseling? In genetic counseling, you will talk with a genetic counselor or physician expert about the following: . Your risk of getting a hereditary type of cancer  . Who in your family could potentially get tested  . How testing is done  . What the test results may mean  . What you may do depending on the results How is genetic testing done? Genetic testing typically is done from a blood sample or saliva sample. When is multigene panel testing recommended? Multigene panel testing may be useful if you . are at risk of a family cancer syndrome that has more than one gene associated with it  . have a personal or family history of cancer and single-gene testing has not found a mutation, or the result is uncertain What are the benefits of multigene panel testing? Multigene panel testing looks at multiple genes with one test. If a gene   mutation is found, multigene panel testing may . give you a better understanding of your cancer risk than single-gene testing  . help your health care team decide what cancer screenings you might need beyond routine screenings  . help you think about what you can do to prevent cancer What are the risks of multigene panel  testing? The risks of multigene panel testing may include the following: . Results can be complicated to interpret.  . Testing may find gene mutations that show a moderate or uncertain risk of cancer.  . It may be hard to know what you should do with your test results. You should talk with a genetic counselor or physician expert before and after genetic testing to learn what the results mean. If I have a gene mutation, should I tell my family? Having a gene mutation means you can pass the mutation to your children. Your siblings also may have the gene mutation. Although you do not have to tell your family members, sharing the information could be life-saving for them. With this information, your family members can decide whether to be tested and get cancer screenings at an early age. How can I prevent cancer if I test positive for a gene mutation? If you test positive for a gene mutation, you can discuss cancer screening and prevention options with your ob-gyn, genetic counselor, or other health care professional. It may be helpful to have earlier or more frequent cancer screening tests, which can find cancer at an early and more curable stage. Risk reduction steps like medication, surgery, and lifestyle changes also may be recommended. I'm concerned about discrimination based on genetic testing results. What should I know? Many people are concerned about possible employment discrimination or denial of insurance coverage based on genetic testing results. The Genetic Information Nondiscrimination Act of 2008 (GINA) makes it illegal for health insurers to require genetic testing results or use results to make decisions about coverage, rates, or preexisting conditions. GINA also makes it illegal for employers to discriminate against employees or applicants because of genetic information. GINA does not apply to life insurance, long-term care insurance, or disability insurance. What should I know about  direct-to-consumer genetic tests? Direct-to-consumer (or at-home) genetic tests are sold over the internet. You do not need a doctor's order to get one. The [employer] discourages use of direct-to-consumer genetic tests because the results may be misleading. For example, one commercial test for BRCA mutations only looks for three mutations, even though there are more than 500 BRCA mutations linked to cancer. The test results could cause unnecessary fear, or a false sense that you are not at risk. You should see a health care professional if you want a genetic test.  Glossary BRCA1 and BRCA2: Genes that keep cells from growing too rapidly. Changes in these genes have been linked to an increased risk of breast cancer and ovarian cancer.  Cowden Syndrome: A genetic condition that increases a person's risk of cancer of the breast, thyroid, uterus, colon, kidney, and skin. Genes: Segments of DNA that contain instructions for the development of a person's physical traits and control of the processes in the body. The gene is the basic unit of heredity and can be passed from parent to child. Genetic Counselor: A health care professional with special training in genetics who can provide expert advice about genetic disorders and prenatal testing. Hereditary Breast and Ovarian Cancer (HBOC) Syndrome: A genetic condition that increases a person's risk of cancer  of the breast, ovary, prostate, pancreas, and skin (melanoma). Li-Fraumeni Syndrome: A genetic condition that increases a person's risk of cancer of the breast, bones, soft tissue, brain, and outer layer of the adrenal glands. Lynch Syndrome: A genetic condition that increases a person's risk of cancer of the colon, rectum, ovary, uterus, pancreas, and bile duct. Multigene Panel Testing: A type of genetic test that can look for mutations in multiple genes at once. Mutations: Changes in genes that can be passed from  parent to child. Obstetrician-Gynecologist (Ob-Gyn): A doctor with special training and education in women's health. Peutz-Jeghers Syndrome: A genetic condition that increases a person's risk of cancer of the stomach, intestines, pancreas, cervix, ovary, and breast.

## 2018-08-12 LAB — CA 125: Cancer Antigen (CA) 125: 10.9 U/mL (ref 0.0–38.1)

## 2018-08-15 DIAGNOSIS — Z1371 Encounter for nonprocreative screening for genetic disease carrier status: Secondary | ICD-10-CM

## 2018-08-15 DIAGNOSIS — Z803 Family history of malignant neoplasm of breast: Secondary | ICD-10-CM

## 2018-08-15 HISTORY — DX: Family history of malignant neoplasm of breast: Z80.3

## 2018-08-15 HISTORY — DX: Encounter for nonprocreative screening for genetic disease carrier status: Z13.71

## 2018-08-17 ENCOUNTER — Telehealth: Payer: Self-pay

## 2018-08-17 NOTE — Telephone Encounter (Signed)
Pt calling; is there an order in for a colonoscopy?  [phone]

## 2018-08-17 NOTE — Telephone Encounter (Signed)
Please advise. I didn't see anything in the notes about referral for colonoscopy.

## 2018-08-18 ENCOUNTER — Other Ambulatory Visit: Payer: Self-pay | Admitting: Obstetrics & Gynecology

## 2018-08-18 DIAGNOSIS — Z1211 Encounter for screening for malignant neoplasm of colon: Secondary | ICD-10-CM

## 2018-08-19 ENCOUNTER — Encounter: Payer: Self-pay | Admitting: Family Medicine

## 2018-08-19 ENCOUNTER — Ambulatory Visit
Admission: RE | Admit: 2018-08-19 | Discharge: 2018-08-19 | Disposition: A | Discharge status: Home / Self Care | Payer: Commercial Managed Care - PPO | Source: Ambulatory Visit | Attending: Family Medicine | Admitting: Family Medicine

## 2018-08-19 ENCOUNTER — Ambulatory Visit: Payer: Commercial Managed Care - PPO | Admitting: Family Medicine

## 2018-08-19 ENCOUNTER — Other Ambulatory Visit: Payer: Self-pay

## 2018-08-19 ENCOUNTER — Other Ambulatory Visit
Admission: RE | Admit: 2018-08-19 | Discharge: 2018-08-19 | Disposition: A | Discharge status: Home / Self Care | Payer: Commercial Managed Care - PPO | Source: Ambulatory Visit | Attending: Family Medicine | Admitting: Family Medicine

## 2018-08-19 VITALS — BP 132/86 | HR 90 | Temp 97.8°F | Resp 16 | Ht 67.0 in | Wt 209.9 lb

## 2018-08-19 DIAGNOSIS — Z1211 Encounter for screening for malignant neoplasm of colon: Secondary | ICD-10-CM

## 2018-08-19 DIAGNOSIS — R1031 Right lower quadrant pain: Secondary | ICD-10-CM | POA: Insufficient documentation

## 2018-08-19 DIAGNOSIS — L309 Dermatitis, unspecified: Secondary | ICD-10-CM

## 2018-08-19 LAB — CBC WITH DIFFERENTIAL/PLATELET
Abs Immature Granulocytes: 0.01 K/uL (ref 0.00–0.07)
Basophils Absolute: 0 K/uL (ref 0.0–0.1)
Basophils Relative: 1 %
Eosinophils Absolute: 0.1 K/uL (ref 0.0–0.5)
Eosinophils Relative: 3 %
HCT: 39.7 % (ref 36.0–46.0)
Hemoglobin: 12.9 g/dL (ref 12.0–15.0)
Immature Granulocytes: 0 %
Lymphocytes Relative: 39 %
Lymphs Abs: 2 K/uL (ref 0.7–4.0)
MCH: 26.5 pg (ref 26.0–34.0)
MCHC: 32.5 g/dL (ref 30.0–36.0)
MCV: 81.5 fL (ref 80.0–100.0)
Monocytes Absolute: 0.3 K/uL (ref 0.1–1.0)
Monocytes Relative: 6 %
Neutro Abs: 2.6 K/uL (ref 1.7–7.7)
Neutrophils Relative %: 51 %
Platelets: 217 K/uL (ref 150–400)
RBC: 4.87 MIL/uL (ref 3.87–5.11)
RDW: 13.2 % (ref 11.5–15.5)
WBC: 5.2 K/uL (ref 4.0–10.5)
nRBC: 0 % (ref 0.0–0.2)

## 2018-08-19 LAB — COMPREHENSIVE METABOLIC PANEL
ALT: 14 U/L (ref 0–44)
AST: 16 U/L (ref 15–41)
Albumin: 4.2 g/dL (ref 3.5–5.0)
Alkaline Phosphatase: 82 U/L (ref 38–126)
Anion gap: 9 (ref 5–15)
BUN: 13 mg/dL (ref 6–20)
CO2: 26 mmol/L (ref 22–32)
Calcium: 8.9 mg/dL (ref 8.9–10.3)
Chloride: 105 mmol/L (ref 98–111)
Creatinine, Ser: 0.84 mg/dL (ref 0.44–1.00)
GFR calc Af Amer: >60 mL/min (ref >60)
GFR calc non Af Amer: >60 mL/min (ref >60)
Glucose, Bld: 111 mg/dL — ABNORMAL HIGH (ref 70–99)
Potassium: 3.9 mmol/L (ref 3.5–5.1)
Sodium: 140 mmol/L (ref 135–145)
Total Bilirubin: 0.6 mg/dL (ref 0.3–1.2)
Total Protein: 7.1 g/dL (ref 6.5–8.1)

## 2018-08-19 MED ORDER — IOHEXOL 300 MG/ML  SOLN
100.0000 mL | Freq: Once | INTRAMUSCULAR | Status: AC | PRN
  Administered 2018-08-19: 100 mL via INTRAVENOUS

## 2018-08-19 NOTE — Progress Notes (Signed)
Name: Desiree Walker   MRN: [id number]    DOB: 09/25/1960   Date:08/19/2018       Progress Note  Subjective  Chief Complaint  Chief Complaint  Patient presents with  . 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 She states the first evening with the pain she had worsening of symptoms when on right lateral decubitus. Pain is dull , aching, does not radiate, no bulging , redness or warmth. She was worried about being an ovarian issue so she saw Dr. Kenton Kingfisher had a normal pelvic US except for fibroids and also negative CA 125. No dysuria, hematuria or urinary frequency. No personal history of kidney stones. No change in appetite, bowel movements or fever and chills.    Patient Active Problem List   Diagnosis Date Noted  . Family history of breast cancer 06/02/2018  . Family history of ovarian cancer 06/02/2018  . Eczema 06/10/2017  . Preventative health care 01/15/2017  . History of uterine fibroid 10/23/2016  . Metabolic syndrome 35/70/1779  . Medication monitoring encounter 10/13/2016  . Pressure in chest 12/17/2015  . Anxiety disorder 12/17/2015  . Obesity 12/17/2015  . Sleep apnea 08/29/2015  . Snoring 08/27/2015  . Vitamin D deficiency 08/19/2015  . Leg cramps 08/05/2015  . IFG (impaired fasting glucose) 08/05/2015  . Anal fistula   . Other specified symptoms and signs involving the digestive system and abdomen 10/31/2014  . 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 Bladder neoplasm of uncertain malignant potential 06/09/2013  . Female genuine stress incontinence 06/05/2013  . Incomplete bladder emptying 06/05/2013  . Urge incontinence 06/05/2013  . Dysuria 06/05/2013   . Other chronic cystitis without hematuria 06/05/2013  . Retention of urine 06/05/2013  . Chronic RLQ pain 07/27/2012  . Palpitations 09/11/2010  . Hypothyroidism, adult 05/10/2009  . Hyperlipidemia LDL goal <130 05/10/2009  . Hypertension goal BP (blood pressure) < 140/90 05/10/2009    Past Surgical History:  Procedure Laterality Date  . ANAL FISTULOTOMY N/A 11/30/2014   Procedure: ANAL FISTULOTOMY;  Surgeon: Dia Crawford III, MD;  Location: [facility] ORS;  Service: General;  Laterality: N/A;  . CESAREAN SECTION    . COLONOSCOPY  2014  . DILATION AND CURETTAGE OF UTERUS    . ENDOMETRIAL ABLATION W/ NOVASURE  05/29/2009   Dr Kenton Kingfisher  . left foot surgery    . RECTAL EXAM UNDER ANESTHESIA N/A 11/30/2014   Procedure: RECTAL EXAM UNDER ANESTHESIA;  Surgeon: Dia Crawford III, MD;  Location: [facility] ORS;  Service: General;  Laterality: N/A;    Family History  Problem Relation Age of Onset  . Hypertension Mother   . Heart disease Mother 66       "blockages"  . Alzheimer's disease Mother   . Diverticulitis Father   . Hernia Father   . Depression Father   . Alzheimer's disease Father   . Hypothyroidism Sister   . Uterine cancer Sister 54  . Ovarian cancer Sister   . Breast cancer Maternal Aunt 8  . Hypothyroidism Daughter   . Hypertension Brother   . Stroke Maternal Grandfather   . Heart attack Maternal Grandfather   .  Stroke Paternal Grandmother   . Hypothyroidism Sister   . Ovarian cancer Sister   . Hypothyroidism Daughter   . Breast cancer Paternal Aunt 52  . Breast cancer Other 30    Social History   Socioeconomic History  . Marital status: Married    Spouse name: Herbie Baltimore A. Mogel  . Number of children: 3  . Years of education: Not on file  . Highest education level: Associate degree: occupational, Hotel manager, or vocational program  Occupational History  . Occupation: Insurance underwriter Pediatrics  Social Needs  . Financial resource strain: Not hard at all  . Food insecurity:     Worry: Never true    Inability: Never true  . Transportation needs:    Medical: No    Non-medical: No  Tobacco Use  . Smoking status: Never Smoker  . Smokeless tobacco: Never Used  Substance and Sexual Activity  . Alcohol use: No    Alcohol/week: 0.0 standard drinks  . Drug use: No  . Sexual activity: Yes    Birth control/protection: Post-menopausal  Lifestyle  . Physical activity:    Days per week: 0 days    Minutes per session: 0 min  . Stress: Not at all  Relationships  . Social connections:    Talks on phone: More than three times a week    Gets together: More than three times a week    Attends religious service: Never    Active member of club or organization: No    Attends meetings of clubs or organizations: Never    Relationship status: Married  . Intimate partner violence:    Fear of current or ex partner: No    Emotionally abused: No    Physically abused: No    Forced sexual activity: No  Other Topics Concern  . Not on file  Social History Narrative  . Not on file     Current Outpatient Medications:  .  aspirin EC 81 MG tablet, Take 81 mg by mouth once. , Disp: , Rfl:  .  clobetasol cream (TEMOVATE) 0.05 %, APPLY TO AFFECTED AREA EVERY DAY, Disp: , Rfl:  .  hydrOXYzine (ATARAX/VISTARIL) 10 MG tablet, Take 5 mg by mouth as needed., Disp: , Rfl:  .  levothyroxine (SYNTHROID) 112 MCG tablet, TAKE ONE TABLET DAILY. EVERY SUNDAY TAKE AN EXTRA ONE-HALF TABLET., Disp: 90 tablet, Rfl: 3 .  losartan (COZAAR) 50 MG tablet, TAKE 1.5 TABLETS (75 MG TOTAL) BY MOUTH DAILY., Disp: 135 tablet, Rfl: 1 .  busPIRone (BUSPAR) 7.5 MG tablet, TAKE 1 TABLET ONCE DAILY AT NIGHT MAY TAKE 1 ADDITIONAL TAB ONCE DAILY AS NEEDED FOR ANXIETY. (Patient not taking: Reported on 08/11/2018), Disp: 270 tablet, Rfl: 1 .  fluticasone (FLONASE) 50 MCG/ACT nasal spray, Place 2 sprays into both nostrils daily. (Patient not taking: Reported on 08/11/2018), Disp: 16 g, Rfl: 6  Allergies  Allergen  Reactions  . Scopolamine Other (See Comments)    "[place]"    I personally reviewed active problem list, medication list, allergies, family history, social history with the patient/caregiver today.   ROS  Ten systems reviewed and is negative except as mentioned in HPI   Objective  Vitals:   08/19/18 1424  BP: 132/86  Pulse: 90  Resp: 16  Temp: 97.8 F (36.6 C)  TempSrc: Oral  SpO2: 99%  Weight: 209 lb 14.4 oz (95.2 kg)  Height: 5\' 7"  (1.702 m)    Body mass index is 32.87 kg/m.  Physical  Exam  Constitutional: Patient appears well-developed and well-nourished. Obese  No distress.  HEENT: head atraumatic, normocephalic, pupils equal and reactive to light, neck supple, throat within normal limits Cardiovascular: Normal rate, regular rhythm and normal heart sounds.  No murmur heard. No BLE edema. Pulmonary/Chest: Effort normal and breath sounds normal. No respiratory distress. Abdominal: Soft.  There is tenderness on right lower quadrant, right side seems more pronounced than left side, no redness or increase in warmth, could not feel a mass.  Skin: eczematous patches on both hands  Psychiatric: Patient has a normal mood and affect. behavior is normal. Judgment and thought content normal.  Recent Results (from the past 2160 hour(s))  Cytology - PAP     Status: None   Collection Time: 06/02/18 12:00 AM  Result Value Ref Range   Adequacy      Satisfactory for evaluation  endocervical/transformation zone component PRESENT.   Diagnosis      NEGATIVE FOR INTRAEPITHELIAL LESIONS OR MALIGNANCY.   HPV NOT DETECTED     Comment: Normal Reference Range - NOT Detected   Material Submitted CervicoVaginal Pap [ThinPrep Imaged]   CA 125     Status: None   Collection Time: 08/11/18  2:11 PM  Result Value Ref Range   Cancer Antigen (CA) 125 10.9 0.0 - 38.1 U/mL    Comment: Roche Diagnostics Electrochemiluminescence Immunoassay (ECLIA) Values obtained with  different assay methods or kits cannot be used interchangeably.  Results cannot be interpreted as absolute evidence of the presence or absence of malignant disease.      PHQ2/9: Depression screen [facility] 2/9 08/19/2018 06/07/2018 01/19/2018 09/21/2017 07/30/2017  Decreased Interest 0 0 0 0 0  Down, Depressed, Hopeless 0 0 0 0 1  PHQ - 2 Score 0 0 0 0 1  Altered sleeping 0 0 1 - -  Tired, decreased energy 0 0 1 - -  Change in appetite 0 0 0 - -  Feeling bad or failure about yourself  0 0 0 - -  Trouble concentrating 0 0 0 - -  Moving slowly or fidgety/restless 0 0 0 - -  Suicidal thoughts 0 0 0 - -  PHQ-9 Score 0 0 2 - -  Difficult doing work/chores Not difficult at all Not difficult at all - - -    phq 9 is negative   Fall Risk: Fall Risk  08/19/2018 06/07/2018 01/19/2018 09/21/2017 07/30/2017  Falls in the past year? 0 0 0 No No  Number falls in past yr: 0 0 0 - -  Injury with Fall? 0 0 - - -  Follow up - Falls evaluation completed - - -     Functional Status Survey: Is the patient deaf or have difficulty hearing?: No Does the patient have difficulty seeing, even when wearing glasses/contacts?: Yes Does the patient have difficulty concentrating, remembering, or making decisions?: No Does the patient have difficulty walking or climbing stairs?: No Does the patient have difficulty dressing or bathing?: No Does the patient have difficulty doing errands alone such as visiting a doctor's office or shopping?: No    Assessment & Plan  1. Continuous right lower quadrant pain   2. Colon cancer screening  - Ambulatory referral to Gastroenterology  3. Eczema of both hands   4. Right lower quadrant abdominal pain  - CT ABDOMEN PELVIS W CONTRAST; Future - CBC with Differential/Platelet; Future - Comprehensive metabolic panel; Future

## 2018-08-25 ENCOUNTER — Other Ambulatory Visit: Payer: Self-pay

## 2018-08-25 ENCOUNTER — Ambulatory Visit
Admission: RE | Admit: 2018-08-25 | Discharge: 2018-08-25 | Disposition: A | Discharge status: Home / Self Care | Payer: Commercial Managed Care - PPO | Source: Ambulatory Visit | Attending: Obstetrics & Gynecology | Admitting: Obstetrics & Gynecology

## 2018-08-25 DIAGNOSIS — Z803 Family history of malignant neoplasm of breast: Secondary | ICD-10-CM | POA: Diagnosis present

## 2018-08-25 DIAGNOSIS — Z1231 Encounter for screening mammogram for malignant neoplasm of breast: Secondary | ICD-10-CM | POA: Insufficient documentation

## 2018-08-25 DIAGNOSIS — Z1239 Encounter for other screening for malignant neoplasm of breast: Secondary | ICD-10-CM | POA: Diagnosis present

## 2018-08-26 ENCOUNTER — Other Ambulatory Visit: Payer: Self-pay | Admitting: Obstetrics & Gynecology

## 2018-08-26 DIAGNOSIS — R928 Other abnormal and inconclusive findings on diagnostic imaging of breast: Secondary | ICD-10-CM

## 2018-08-26 DIAGNOSIS — N6310 Unspecified lump in the right breast, unspecified quadrant: Secondary | ICD-10-CM

## 2018-08-29 ENCOUNTER — Encounter: Payer: Self-pay | Admitting: *Deleted

## 2018-08-29 ENCOUNTER — Encounter: Payer: Self-pay | Admitting: Family Medicine

## 2018-08-29 ENCOUNTER — Ambulatory Visit
Admission: RE | Admit: 2018-08-29 | Discharge: 2018-08-29 | Disposition: A | Discharge status: Home / Self Care | Payer: Commercial Managed Care - PPO | Source: Ambulatory Visit | Attending: Obstetrics & Gynecology | Admitting: Obstetrics & Gynecology

## 2018-08-29 ENCOUNTER — Other Ambulatory Visit: Payer: Self-pay

## 2018-08-29 ENCOUNTER — Telehealth: Payer: Self-pay | Admitting: Obstetrics & Gynecology

## 2018-08-29 ENCOUNTER — Other Ambulatory Visit: Payer: Self-pay | Admitting: Obstetrics & Gynecology

## 2018-08-29 DIAGNOSIS — N6310 Unspecified lump in the right breast, unspecified quadrant: Secondary | ICD-10-CM

## 2018-08-29 DIAGNOSIS — R928 Other abnormal and inconclusive findings on diagnostic imaging of breast: Secondary | ICD-10-CM | POA: Insufficient documentation

## 2018-08-29 NOTE — Progress Notes (Signed)
Discussed results and options for management, referral, biopsy  Barnett Applebaum, MD, [facility], [facility] 08/29/2018  4:43 PM

## 2018-08-29 NOTE — Telephone Encounter (Signed)
Patient returning [facility] call to go over results.  Same cb

## 2018-08-31 ENCOUNTER — Ambulatory Visit: Payer: Commercial Managed Care - PPO | Admitting: Family Medicine

## 2018-09-07 ENCOUNTER — Ambulatory Visit: Payer: Commercial Managed Care - PPO | Admitting: Nurse Practitioner

## 2018-09-08 ENCOUNTER — Ambulatory Visit
Admission: RE | Admit: 2018-09-08 | Discharge: 2018-09-08 | Disposition: A | Discharge status: Home / Self Care | Payer: Commercial Managed Care - PPO | Source: Ambulatory Visit | Attending: Obstetrics & Gynecology | Admitting: Obstetrics & Gynecology

## 2018-09-08 ENCOUNTER — Other Ambulatory Visit: Payer: Self-pay

## 2018-09-08 DIAGNOSIS — N6310 Unspecified lump in the right breast, unspecified quadrant: Secondary | ICD-10-CM | POA: Insufficient documentation

## 2018-09-08 DIAGNOSIS — R928 Other abnormal and inconclusive findings on diagnostic imaging of breast: Secondary | ICD-10-CM | POA: Diagnosis not present

## 2018-09-08 HISTORY — PX: BREAST BIOPSY: SHX20

## 2018-09-09 LAB — SURGICAL PATHOLOGY

## 2018-09-20 ENCOUNTER — Other Ambulatory Visit: Payer: Self-pay

## 2018-09-20 ENCOUNTER — Ambulatory Visit: Payer: Commercial Managed Care - PPO | Admitting: Family Medicine

## 2018-10-07 ENCOUNTER — Ambulatory Visit (INDEPENDENT_AMBULATORY_CARE_PROVIDER_SITE_OTHER): Payer: BC Managed Care – PPO

## 2018-10-07 ENCOUNTER — Encounter

## 2018-10-07 ENCOUNTER — Ambulatory Visit: Payer: BC Managed Care – PPO | Admitting: Podiatry

## 2018-10-07 ENCOUNTER — Other Ambulatory Visit: Payer: Self-pay | Admitting: Podiatry

## 2018-10-07 ENCOUNTER — Other Ambulatory Visit: Payer: Self-pay

## 2018-10-07 VITALS — Temp 98.0°F

## 2018-10-07 DIAGNOSIS — M7752 Other enthesopathy of left foot: Secondary | ICD-10-CM

## 2018-10-07 DIAGNOSIS — M19072 Primary osteoarthritis, left ankle and foot: Secondary | ICD-10-CM

## 2018-10-07 DIAGNOSIS — T8484XA Pain due to internal orthopedic prosthetic devices, implants and grafts, initial encounter: Secondary | ICD-10-CM

## 2018-10-07 NOTE — Patient Instructions (Signed)
Pre-Operative Instructions  Congratulations, you have decided to take an important step towards improving your quality of life.  You can be assured that the doctors and staff at [facility] will be with you every step of the way.  Here are some important things you should know:  1. Plan to be at the surgery center/hospital at least 1 (one) hour prior to your scheduled time, unless otherwise directed by the surgical center/hospital staff.  You must have a responsible adult accompany you, remain during the surgery and drive you home.  Make sure you have directions to the surgical center/hospital to ensure you arrive on time. 2. If you are having surgery at [facility] or [facility] hospitals, you will need a copy of your medical history and physical form from your family physician within one month prior to the date of surgery. We will give you a form for your primary physician to complete.  3. We make every effort to accommodate the date you request for surgery.  However, there are times where surgery dates or times have to be moved.  We will contact you as soon as possible if a change in schedule is required.   4. No aspirin/ibuprofen for one week before surgery.  If you are on aspirin, any non-steroidal anti-inflammatory medications (Mobic, Aleve, Ibuprofen) should not be taken seven (7) days prior to your surgery.  You make take Tylenol for pain prior to surgery.  5. Medications - If you are taking daily heart and blood pressure medications, seizure, reflux, allergy, asthma, anxiety, pain or diabetes medications, make sure you notify the surgery center/hospital before the day of surgery so they can tell you which medications you should take or avoid the day of surgery. 6. No food or drink after midnight the night before surgery unless directed otherwise by surgical center/hospital staff. 7. No alcoholic beverages 24-hours prior to surgery.  No smoking 24-hours prior or 24-hours after  surgery. 8. Wear loose pants or shorts. They should be loose enough to fit over bandages, boots, and casts. 9. Don't wear slip-on shoes. Sneakers are preferred. 10. Bring your boot with you to the surgery center/hospital.  Also bring crutches or a walker if your physician has prescribed it for you.  If you do not have this equipment, it will be provided for you after surgery. 11. If you have not been contacted by the surgery center/hospital by the day before your surgery, call to confirm the date and time of your surgery. 12. Leave-time from work may vary depending on the type of surgery you have.  Appropriate arrangements should be made prior to surgery with your employer. 13. Prescriptions will be provided immediately following surgery by your doctor.  Fill these as soon as possible after surgery and take the medication as directed. Pain medications will not be refilled on weekends and must be approved by the doctor. 14. Remove nail polish on the operative foot and avoid getting pedicures prior to surgery. 15. Wash the night before surgery.  The night before surgery wash the foot and leg well with water and the antibacterial soap provided. Be sure to pay special attention to beneath the toenails and in between the toes.  Wash for at least three (3) minutes. Rinse thoroughly with water and dry well with a towel.  Perform this wash unless told not to do so by your physician.  Enclosed: 1 Ice pack (please put in freezer the night before surgery)   1 Hibiclens skin cleaner     Pre-op instructions  If you have any questions regarding the instructions, please do not hesitate to call our office.  [place]: [address], [place], [place] [address] -- [phone]  [place]: [address] [place], [place] [address] -- [phone]  [place]: [address]  [place], [place] [address] -- [phone]  [place]: [address], [address], [place], [place] [address] -- [phone]  Website:  [email or web address] 

## 2018-10-10 ENCOUNTER — Telehealth: Payer: Self-pay | Admitting: *Deleted

## 2018-10-10 NOTE — Telephone Encounter (Signed)
"  I'm calling to schedule my surgery with Dr. Amalia Hailey.  I'd like to have it done on August 3."  Dr. Amalia Hailey does not have anything available on August 3.  He can do a lunch time case at 12:15 pm.  That's the only time he has available in August.  "I guess I will take it.  I hate to do it so late in the day but I have no other choice do I?"  You do not, Dr. Amalia Hailey' next available date is not going to be until towards the end of September.  "I don't want to wait that long.  I am going on vacation and I don't want to do it before then.  I have no choice, go ahead and put me down for then."  I'll get it scheduled.

## 2018-10-14 ENCOUNTER — Telehealth: Payer: Self-pay | Admitting: Podiatry

## 2018-10-14 NOTE — Progress Notes (Signed)
Subjective: 58 year old female presents today with a chief complaint of sharp pain to the lateral aspect of the left foot and ankle that began 3-4 weeks ago. She states walking increases the pain. She has been taking Medrol prescribed by her PCP for a skin issue that has not provided any significant relief for her current symptoms.  She also reports pain to the lateral border of the left hallux that began a few days ago. Wearing shoes increases the pain. She has not had any treatment for the pain. Patient presents today for further treatment and evaluation.   Past Medical History:  Diagnosis Date  . Anemia    H/O  . Anxiety   . BRCA negative 08/2018   MyRisk neg except NTHL1 VUS  . Complication of anesthesia   . Family history of breast cancer 08/2018   IBIS=8.65/riskscore=7.9%  . Family history of ovarian cancer    MyRisk neg except NTHL1 VUS  . GERD (gastroesophageal reflux disease)    NO MEDS  . Hyperlipidemia   . Hypertension   . Hypothyroidism    Hashimoto's per patient  . IFG (impaired fasting glucose) 08/05/2015  . Metabolic syndrome 8/76/8115  . Palpitations 2012   Evaluated by Ida Rogue, MD, Holter moniter  . PONV (postoperative nausea and vomiting)   . Rectal mass   . Thyroid disease    unspecified hypothyroidism     Objective: Physical Exam General: The patient is alert and oriented x3 in no acute distress.  Dermatology:  Lateral border of the left hallux appears to be erythematous with evidence of an ingrowing nail. Pain on palpation noted to the border of the nail fold. Skin is warm, dry and supple bilateral lower extremities. Negative for open lesions or macerations bilateral.   Vascular: Dorsalis Pedis and Posterior Tibial pulses palpable bilateral.  Capillary fill time is immediate to all digits.  Neurological: Epicritic and protective threshold intact bilateral.   Musculoskeletal: Pain with palpation noted to the medial, lateral and anterior  aspects of the left ankle joint. All other joints range of motion within normal limits bilateral. Strength 5/5 in all groups bilateral.   Radiographic Exam: DJD with joint space narrowing noted to anterior aspect of ankle joint.    Assessment: 1. Ingrown nail lateral border left hallux  2. Prominent orthopedic screws - Orthohelix 4.0 screws to metatarsal cuneiform joint left foot as per operative note dated 10/20/13 3. Painful hardware left lower extremity- 7.0 mm Orthohelix subtalar screws x 2.  4.0 mm Orthohelix Lapidus screws x 2. 4. DJD left ankle   Plan of Care:  1. Patient evaluated. X-Rays reviewed.  2. Today we discussed the conservative versus surgical management of the presenting pathology. The patient opts for surgical management. All possible complications and details of the procedure were explained. All patient questions were answered. No guarantees were expressed or implied. 3. Authorization for surgery was initiated today. Surgery will consist of ankle arthroscopy left; ROH x 4 left.  4. Patient declined nail avulsion. Light debridement performed with tissue nipper.  5. Continue taking Medrol as directed by PCP for atopic dermatitis of hands.  6. Return to clinic one week post op.    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    Edrick Kins, DPM [facility]  Dr. Edrick Kins, DPM    [address]  [place], [place] [address]                Office [phone]  Fax [phone]     

## 2018-10-17 NOTE — Telephone Encounter (Signed)
Calling to make sure I have the correct date and time of my surgery with Dr. Amalia Hailey. I have it scheduled on Wednesday, 02 September at 12:15 pm.

## 2018-10-17 NOTE — Telephone Encounter (Signed)
Tried calling pt to confirm she has the correct date and time for her surgery scheduled in [place] at [place]. Unable to leave a voicemail as her mailbox is full.

## 2018-10-25 ENCOUNTER — Encounter: Payer: Self-pay | Admitting: Nurse Practitioner

## 2018-10-25 ENCOUNTER — Ambulatory Visit (INDEPENDENT_AMBULATORY_CARE_PROVIDER_SITE_OTHER): Payer: BC Managed Care – PPO | Admitting: Nurse Practitioner

## 2018-10-25 DIAGNOSIS — L700 Acne vulgaris: Secondary | ICD-10-CM | POA: Diagnosis not present

## 2018-10-25 DIAGNOSIS — L2089 Other atopic dermatitis: Secondary | ICD-10-CM | POA: Diagnosis not present

## 2018-10-25 MED ORDER — PREDNISONE 10 MG (21) PO TBPK: ORAL_TABLET | Freq: Every day | ORAL | 0 refills | Status: DC

## 2018-10-25 NOTE — Patient Instructions (Addendum)
Try to avoid touching your face as much as possible. But you should also be sure to wash your face with a gentle cleanser before applying a mask to prevent trapping bacteria under the mask and pushing it further into your skin, I recommend starting with a benzoyl peroxide cleanser once a day to target bacteria and remove excess oil. Immediately cleanse your skin after removing your mask and to use a cleanser that won't over-dry or stripping your skin, which can worsen irritation. Cetaphil Gentle Skin Cleanser is good to use, which can also be used without water. If you have irritated or sensitive skin, gently swipe a cotton round with the cleanser over your skin.  After cleansing, be sure to add moisture back into the skin by using a gentle, fragrance-free and non-comedogenic moisturizer like Cetaphil Daily Hydrating Lotion, which is formulated with hyaluronic acid to help hydrate, soothe, and restore the skin protective barrier.  You can use a very low potency steroid in small amounts for no more than 2 weeks on your face. I would recommend hydrocortisone 1% on areas that are itchy without raised bumps.   Follow- up with dermatologist if not improving.

## 2018-10-25 NOTE — Progress Notes (Signed)
Virtual Visit via Video Note  I connected with Dayna Ramus on 10/25/18 at 10:40 AM EDT by a video enabled telemedicine application and verified that I am speaking with the correct person using two identifiers.   Staff discussed the limitations of evaluation and management by telemedicine and the availability of in person appointments. The patient expressed understanding and agreed to proceed.  Patient location: home  My location: work office Other people present: none HPI  Patient has atopic dermatitis on hands, notice since wearing mask and hand washing more frequestnly she has worsening rash on hands and started to develop rash on face from ear to ear. Skin is dry and itchy and has some acne on chin and by ears.   States about 2 weeks ago took a 6 day taper of prednisone that cleared everything up.- dermatologist.   States on her face she has uses cera ve moisturizing cream on face with some relief.  States on her hands cool water helps, warm water makes it worse, has used cera ve and has used clobetasol cream on hands.  Took benadryl last night that helped her sleep and reduced itching.   PHQ2/9: Depression screen [facility] 2/9 10/25/2018 08/19/2018 06/07/2018 01/19/2018 09/21/2017  Decreased Interest 0 0 0 0 0  Down, Depressed, Hopeless 0 0 0 0 0  PHQ - 2 Score 0 0 0 0 0  Altered sleeping 0 0 0 1 -  Tired, decreased energy 0 0 0 1 -  Change in appetite 0 0 0 0 -  Feeling bad or failure about yourself  0 0 0 0 -  Trouble concentrating 0 0 0 0 -  Moving slowly or fidgety/restless 0 0 0 0 -  Suicidal thoughts 0 0 0 0 -  PHQ-9 Score 0 0 0 2 -  Difficult doing work/chores - Not difficult at all Not difficult at all - -     PHQ reviewed. Negative  Patient Active Problem List   Diagnosis Date Noted  . Family history of breast cancer 06/02/2018  . Family history of ovarian cancer 06/02/2018  . Eczema 06/10/2017  . Preventative health care 01/15/2017  . History of uterine fibroid  10/23/2016  . Metabolic syndrome 29/57/4734  . Medication monitoring encounter 10/13/2016  . Pressure in chest 12/17/2015  . Anxiety disorder 12/17/2015  . Obesity 12/17/2015  . Sleep apnea 08/29/2015  . Snoring 08/27/2015  . Vitamin D deficiency 08/19/2015  . Leg cramps 08/05/2015  . IFG (impaired fasting glucose) 08/05/2015  . Anal fistula   . Other specified symptoms and signs involving the digestive system and abdomen 10/31/2014  . 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 Bladder neoplasm of uncertain malignant potential 06/09/2013  . Female genuine stress incontinence 06/05/2013  . Incomplete bladder emptying 06/05/2013  . Urge incontinence 06/05/2013  . Dysuria 06/05/2013  . Other chronic cystitis without hematuria 06/05/2013  . Retention of urine 06/05/2013  . Chronic RLQ pain 07/27/2012  . Palpitations 09/11/2010  . Hypothyroidism, adult 05/10/2009  . Hyperlipidemia LDL goal <130 05/10/2009  . Hypertension goal BP (blood pressure) < 140/90 05/10/2009    Past Medical History:  Diagnosis Date  . Anemia    H/O  . Anxiety   . BRCA negative 08/2018   MyRisk neg except NTHL1 VUS  . Complication of anesthesia   . Family history of breast cancer 08/2018   IBIS=8.65/riskscore=7.9%  . Family history of ovarian cancer    MyRisk neg except NTHL1 VUS  . GERD (gastroesophageal reflux  disease)    NO MEDS  . Hyperlipidemia   . Hypertension   . Hypothyroidism    Hashimoto's per patient  . IFG (impaired fasting glucose) 08/05/2015  . Metabolic syndrome 6/57/8469  . Palpitations 2012   Evaluated by Ida Rogue, MD, Holter moniter  . PONV (postoperative nausea and vomiting)   . Rectal mass   . Thyroid disease    unspecified hypothyroidism    Past Surgical History:  Procedure Laterality Date  . ANAL FISTULOTOMY N/A 11/30/2014   Procedure: ANAL FISTULOTOMY;  Surgeon: Dia Crawford III, MD;  Location: [facility] ORS;  Service: General;  Laterality: N/A;  . BREAST  BIOPSY Right 09/08/2018   affirm bx of mass, coil clip, path pending  . CESAREAN SECTION    . COLONOSCOPY  2014  . DILATION AND CURETTAGE OF UTERUS    . ENDOMETRIAL ABLATION W/ NOVASURE  05/29/2009   Dr Kenton Kingfisher  . left foot surgery    . RECTAL EXAM UNDER ANESTHESIA N/A 11/30/2014   Procedure: RECTAL EXAM UNDER ANESTHESIA;  Surgeon: Dia Crawford III, MD;  Location: [facility] ORS;  Service: General;  Laterality: N/A;    Social History   Tobacco Use  . Smoking status: Never Smoker  . Smokeless tobacco: Never Used  Substance Use Topics  . Alcohol use: No    Alcohol/week: 0.0 standard drinks     Current Outpatient Medications:  .  aspirin EC 81 MG tablet, Take 81 mg by mouth once. , Disp: , Rfl:  .  clobetasol cream (TEMOVATE) 0.05 %, APPLY TO AFFECTED AREA EVERY DAY, Disp: , Rfl:  .  DUPIXENT 300 MG/2ML prefilled syringe, , Disp: , Rfl:  .  hydrOXYzine (ATARAX/VISTARIL) 10 MG tablet, Take 5 mg by mouth as needed., Disp: , Rfl:  .  levothyroxine (SYNTHROID) 112 MCG tablet, TAKE ONE TABLET DAILY. EVERY SUNDAY TAKE AN EXTRA ONE-HALF TABLET., Disp: 90 tablet, Rfl: 3 .  losartan (COZAAR) 50 MG tablet, TAKE 1.5 TABLETS (75 MG TOTAL) BY MOUTH DAILY., Disp: 135 tablet, Rfl: 1 .  EUCRISA 2 % OINT, 1(ONE) APPLICATION(S) TOPICAL EVERY DAY, Disp: , Rfl:  .  fluticasone (FLONASE) 50 MCG/ACT nasal spray, Place 2 sprays into both nostrils daily. (Patient not taking: Reported on 08/11/2018), Disp: 16 g, Rfl: 6 .  predniSONE (STERAPRED UNI-PAK 21 TAB) 10 MG (21) TBPK tablet, TAKE AS DIRECTED , 6 DAY DOSE PACK, Disp: , Rfl:   Allergies  Allergen Reactions  . Scopolamine Other (See Comments)    "[place]"    ROS   No other specific complaints in a complete review of systems (except as listed in HPI above).  Objective  There were no vitals filed for this visit.   There is no height or weight on file to calculate BMI.  Nursing Note and Vital Signs reviewed.  Physical Exam HENT:      Head:           Assessment & Plan  1. Other atopic dermatitis Oral antihistamine for itching; cleansing instructions- if still not improved can do steroid taper and follow-up with derm - predniSONE (STERAPRED UNI-PAK 21 TAB) 10 MG (21) TBPK tablet; Take by mouth daily. Take as directed  Dispense: 21 tablet; Refill: 0  2. Acne vulgaris Cleansing and benzyl peroxide     Follow Up Instructions:    I discussed the assessment and treatment plan with the patient. The patient was provided an opportunity to ask questions and all were answered. The patient agreed with  the plan and demonstrated an understanding of the instructions.   The patient was advised to call back or seek an in-person evaluation if the symptoms worsen or if the condition fails to improve as anticipated.  I provided 16 minutes of non-face-to-face time during this encounter.   Fredderick Severance, NP

## 2018-11-02 ENCOUNTER — Telehealth: Payer: Self-pay | Admitting: Family Medicine

## 2018-11-02 ENCOUNTER — Telehealth: Payer: Self-pay | Admitting: Podiatry

## 2018-11-02 DIAGNOSIS — L2089 Other atopic dermatitis: Secondary | ICD-10-CM

## 2018-11-02 NOTE — Telephone Encounter (Signed)
Patient calling to inquire if an RX for hydrocortisone can be called into pharmacy. At last visit patient states that she was advised to use OTC and that seems to have burned her face a bit.

## 2018-11-02 NOTE — Telephone Encounter (Signed)
Pt is has some questions and concerned about her upcoming sx on 11/17/2018. She would like to speak to Dr. Amalia Hailey some time today.

## 2018-11-03 ENCOUNTER — Encounter: Payer: Self-pay | Admitting: Podiatry

## 2018-11-03 ENCOUNTER — Other Ambulatory Visit: Payer: Self-pay | Admitting: Emergency Medicine

## 2018-11-03 NOTE — Telephone Encounter (Signed)
Pt states the a topic dermatis did not clear up.  Pt states the pharmacist advised she needs a Rx hydrocortisone 1% ophthalmic topical ointment.. Pt states she is going on vacation next week Hopes it can be called in today  [id number], [place] [phone] (Phone) [phone] (Fax)

## 2018-11-03 NOTE — Telephone Encounter (Signed)
Please advise 

## 2018-11-04 ENCOUNTER — Telehealth: Payer: Self-pay | Admitting: Podiatry

## 2018-11-04 MED ORDER — EUCRISA 2 % EX OINT: 1.0000 "application " | TOPICAL_OINTMENT | Freq: Every day | CUTANEOUS | 1 refills | Status: DC | PRN

## 2018-11-04 MED ORDER — PIMECROLIMUS 1 % EX CREA: TOPICAL_CREAM | Freq: Every day | CUTANEOUS | 0 refills | Status: AC

## 2018-11-04 NOTE — Telephone Encounter (Signed)
eucrisa is deemed safe for use on face. We can trial elidel on face for atopic dermatitis- do not use for more thatn 7 days, do not put in eyes. Pimecrolimus is for use on the skin only. Avoid getting this medication in your eyes or on the inside of your nose or mouth. Do not apply this medication to open wounds or infected areas.

## 2018-11-04 NOTE — Telephone Encounter (Signed)
Hydrocortisone ophthalmic is to be prescribed by specialist. Have her reach out to her dermatologist. I am sending eucrisa that she can use on her face safely.

## 2018-11-04 NOTE — Telephone Encounter (Signed)
Pt called stating she is needing this medication. Pt states she is going out of town and is needing this medication before then.  Please advise.

## 2018-11-04 NOTE — Telephone Encounter (Signed)
Patient called stating she is scheduled for surgery on 11/16/18 and has broke out in a rash and her doctor has prescribed her prednisone. Patient is wanting to know if taking the prednisone will affect her having her surgery.   Please give patient a call.

## 2018-11-04 NOTE — Addendum Note (Signed)
Addended by: Fredderick Severance on: 11/04/2018 03:40 PM   Modules accepted: Orders

## 2018-11-04 NOTE — Telephone Encounter (Addendum)
I attempted to return her call.  I left her a message to call me back on Monday.  I asked Dr. Jacqualyn Posey about patient having the surgery.  He stated that if the patient is prescribed to take the Prednisone longer than a week, he suggests we hold off on the surgery.  It appears that she is taking a Sterapred 21 day dose pack.

## 2018-11-04 NOTE — Telephone Encounter (Signed)
Told her you would not give hydrocortisone but u did send eucrisa.  She state she can't use eucrisa on her face, is there anything she can use otc.

## 2018-11-11 ENCOUNTER — Telehealth: Payer: Self-pay | Admitting: *Deleted

## 2018-11-11 NOTE — Telephone Encounter (Signed)
DOS 11/16/2018 ANKLE ARTHROSCOPY - [id number] AND REMOVAL FIXATION DEEP KWIRE/SCREWS X 4 - 20680 LT FOOT/ANKLE  BCBS: Effective Date - 09/14/2018 - 03/15/9998   In-Network    Max Per Benefit Period Year-to-Date Remaining  CoInsurance  20%   Deductible  $1250.00 $327.66  Out-Of-Pocket  $4890.00 $3554.76   AMBULATORY SURGERY  In Network  [employer] Coinsurance  Not Applicable  [id number] per [place]

## 2018-11-14 ENCOUNTER — Other Ambulatory Visit: Payer: Self-pay

## 2018-11-14 ENCOUNTER — Ambulatory Visit: Payer: BC Managed Care – PPO | Admitting: Podiatry

## 2018-11-14 ENCOUNTER — Encounter: Payer: Self-pay | Admitting: Podiatry

## 2018-11-14 DIAGNOSIS — M19072 Primary osteoarthritis, left ankle and foot: Secondary | ICD-10-CM

## 2018-11-14 DIAGNOSIS — T8484XA Pain due to internal orthopedic prosthetic devices, implants and grafts, initial encounter: Secondary | ICD-10-CM

## 2018-11-15 ENCOUNTER — Telehealth: Payer: Self-pay | Admitting: *Deleted

## 2018-11-15 NOTE — Telephone Encounter (Signed)
"  I'm scheduled to have surgery tomorrow at the surgery center at 12:30 pm.  My husband is going to be driving me.  Will he be able to go in and wait on me or will he have to sit in the car?"  I don't know.  You need to call the surgery center.  "What's their phone number?"  It is [phone].  "Alright, I'll give them a call."

## 2018-11-16 ENCOUNTER — Encounter: Payer: Self-pay | Admitting: Podiatry

## 2018-11-16 ENCOUNTER — Other Ambulatory Visit: Payer: Self-pay | Admitting: Podiatry

## 2018-11-16 ENCOUNTER — Telehealth: Payer: Self-pay | Admitting: Podiatry

## 2018-11-16 DIAGNOSIS — M19072 Primary osteoarthritis, left ankle and foot: Secondary | ICD-10-CM | POA: Diagnosis not present

## 2018-11-16 MED ORDER — OXYCODONE-ACETAMINOPHEN 5-325 MG PO TABS: 1.0000 | ORAL_TABLET | Freq: Four times a day (QID) | ORAL | 0 refills | Status: DC | PRN

## 2018-11-16 MED ORDER — IBUPROFEN 800 MG PO TABS: 800.0000 mg | ORAL_TABLET | Freq: Three times a day (TID) | ORAL | 0 refills | Status: DC

## 2018-11-16 NOTE — Telephone Encounter (Signed)
Patient called asking for explantation of her prescribed meds post-operatively today by Dr.  Amalia Hailey.  Contacted Dr.  Amalia Hailey and he will call the patient and answer her questions.  Gardiner Barefoot DPM

## 2018-11-16 NOTE — Progress Notes (Signed)
   Subjective: 58 year old female presents today for follow up evaluation of left foot and ankle pain. Surgery is scheduled for 11/16/2018 and patient would like to discuss some concerns prior. She has been taking Medrol as prescribed by her PCP for another condition which is helping to alleviate some of her foot pain. There are no other modifying factors noted at this time. Patient is here for further evaluation and treatment.   Past Medical History:  Diagnosis Date  . Anemia    H/O  . Anxiety   . BRCA negative 08/2018   MyRisk neg except NTHL1 VUS  . Complication of anesthesia   . Family history of breast cancer 08/2018   IBIS=8.65/riskscore=7.9%  . Family history of ovarian cancer    MyRisk neg except NTHL1 VUS  . GERD (gastroesophageal reflux disease)    NO MEDS  . Hyperlipidemia   . Hypertension   . Hypothyroidism    Hashimoto's per patient  . IFG (impaired fasting glucose) 08/05/2015  . Metabolic syndrome 4/82/5003  . Palpitations 2012   Evaluated by Ida Rogue, MD, Holter moniter  . PONV (postoperative nausea and vomiting)   . Rectal mass   . Thyroid disease    unspecified hypothyroidism     Objective: Physical Exam General: The patient is alert and oriented x3 in no acute distress.  Dermatology:  Lateral border of the left hallux appears to be erythematous with evidence of an ingrowing nail. Pain on palpation noted to the border of the nail fold. Skin is warm, dry and supple bilateral lower extremities. Negative for open lesions or macerations bilateral.   Vascular: Dorsalis Pedis and Posterior Tibial pulses palpable bilateral.  Capillary fill time is immediate to all digits.  Neurological: Epicritic and protective threshold intact bilateral.   Musculoskeletal: Pain with palpation noted to the medial, lateral and anterior aspects of the left ankle joint. All other joints range of motion within normal limits bilateral. Strength 5/5 in all groups bilateral.    Assessment: 1. Ingrown nail lateral border left hallux  2. Prominent orthopedic screws - Orthohelix 4.0 screws to metatarsal cuneiform joint left foot as per operative note dated 10/20/13 3. Painful hardware left lower extremity- 7.0 mm Orthohelix subtalar screws x 2.  4.0 mm Orthohelix Lapidus screws x 2. 4. DJD left ankle   Plan of Care:  1. Patient evaluated.  2. Surgery scheduled for Wednesday 11/16/2018. We will modify consent and not remove the screws from the midfoot.  3. Return to clinic one week post op.   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    Edrick Kins, DPM [facility]  Dr. Edrick Kins, DPM    [address], [place] [address]                Office [phone]  Fax [phone]

## 2018-11-16 NOTE — Progress Notes (Signed)
.  postop

## 2018-11-22 ENCOUNTER — Telehealth: Payer: Self-pay | Admitting: *Deleted

## 2018-11-22 NOTE — Telephone Encounter (Signed)
Sure

## 2018-11-22 NOTE — Telephone Encounter (Signed)
Pt asked if she could take the flu shot she had surgery 11/17/2018.

## 2018-11-22 NOTE — Telephone Encounter (Signed)
I informed pt Dr. Amalia Hailey said it would be fine to get the flu shot.

## 2018-11-23 ENCOUNTER — Other Ambulatory Visit: Payer: BC Managed Care – PPO

## 2018-11-25 ENCOUNTER — Other Ambulatory Visit: Payer: Self-pay

## 2018-11-25 ENCOUNTER — Ambulatory Visit: Payer: BC Managed Care – PPO

## 2018-11-25 ENCOUNTER — Encounter: Payer: Self-pay | Admitting: Podiatry

## 2018-11-25 ENCOUNTER — Ambulatory Visit (INDEPENDENT_AMBULATORY_CARE_PROVIDER_SITE_OTHER): Payer: Self-pay | Admitting: Podiatry

## 2018-11-25 VITALS — BP 154/99 | HR 86 | Temp 97.7°F

## 2018-11-25 DIAGNOSIS — T8484XA Pain due to internal orthopedic prosthetic devices, implants and grafts, initial encounter: Secondary | ICD-10-CM

## 2018-11-25 DIAGNOSIS — M19072 Primary osteoarthritis, left ankle and foot: Secondary | ICD-10-CM

## 2018-11-25 DIAGNOSIS — Z9889 Other specified postprocedural states: Secondary | ICD-10-CM

## 2018-11-27 NOTE — Progress Notes (Signed)
   Subjective:  Patient presents today status post left ankle arthroscopy. DOS: 11/16/2018. She states she is doing well. She reports some mild pain that is tolerated by taking Ibuprofen. She denies any worsening factors. She has been using the CAM boot as directed. Patient is here for further evaluation and treatment.    Past Medical History:  Diagnosis Date  . Anemia    H/O  . Anxiety   . BRCA negative 08/2018   MyRisk neg except NTHL1 VUS  . Complication of anesthesia   . Family history of breast cancer 08/2018   IBIS=8.65/riskscore=7.9%  . Family history of ovarian cancer    MyRisk neg except NTHL1 VUS  . GERD (gastroesophageal reflux disease)    NO MEDS  . Hyperlipidemia   . Hypertension   . Hypothyroidism    Hashimoto's per patient  . IFG (impaired fasting glucose) 08/05/2015  . Metabolic syndrome 0/63/8685  . Palpitations 2012   Evaluated by Ida Rogue, MD, Holter moniter  . PONV (postoperative nausea and vomiting)   . Rectal mass   . Thyroid disease    unspecified hypothyroidism      Objective/Physical Exam Neurovascular status intact.  Skin incisions appear to be well coapted with sutures and staples intact. No sign of infectious process noted. No dehiscence. No active bleeding noted. Moderate edema noted to the surgical extremity.  Assessment: 1. s/p left ankle arthroscopy. DOS: 11/16/2018   Plan of Care:  1. Patient was evaluated. X-rays reviewed 2. Dressing changed.  3. Continue using CAM boot.  4. Return to clinic in one week for suture removal.    Edrick Kins, DPM [facility]  Dr. Edrick Kins, [address]                                        [place], [place] [address]                Office [phone]  Fax [phone]

## 2018-11-30 ENCOUNTER — Other Ambulatory Visit: Payer: Self-pay

## 2018-11-30 ENCOUNTER — Encounter: Payer: Self-pay | Admitting: Obstetrics & Gynecology

## 2018-11-30 ENCOUNTER — Ambulatory Visit (INDEPENDENT_AMBULATORY_CARE_PROVIDER_SITE_OTHER): Payer: BC Managed Care – PPO | Admitting: Obstetrics & Gynecology

## 2018-11-30 VITALS — BP 140/90 | Ht 67.0 in | Wt 211.0 lb

## 2018-11-30 DIAGNOSIS — N644 Mastodynia: Secondary | ICD-10-CM | POA: Diagnosis not present

## 2018-11-30 NOTE — Progress Notes (Signed)
HPI:      [name] is a 58 y.o. [phone] who is postmenopausal, presents today for a problem visit.  She complains of breast tenderness on the rightside which she first noticed after SCNB 2 mos ago fir benign breast finding.  It has not significantly changed.  Associated symptoms include none- no skin changes, mass.   Prior breast problems: Yes Family History: Breast Cancer-relatedfamily history includes Breast cancer (age of onset: 8) in an other family member; Breast cancer (age of onset: 47) in her maternal aunt; Breast cancer (age of onset: 81) in her paternal aunt.  PMHx: She  has a past medical history of Anemia, Anxiety, BRCA negative (44/9675), Complication of anesthesia, Family history of breast cancer (08/2018), Family history of ovarian cancer, GERD (gastroesophageal reflux disease), Hyperlipidemia, Hypertension, Hypothyroidism, IFG (impaired fasting glucose) (11/29/3844), Metabolic syndrome (6/59/9357), Palpitations (2012), PONV (postoperative nausea and vomiting), Rectal mass, and Thyroid disease. Also,  has a past surgical history that includes Dilation and curettage of uterus; Cesarean section; left foot surgery; Rectal exam under anesthesia (N/A, 11/30/2014); Anal fistulotomy (N/A, 11/30/2014); Endometrial ablation w/ novasure (05/29/2009); Colonoscopy (2014); and Breast biopsy (Right, 09/08/2018)., family history includes Alzheimer's disease in her father and mother; Breast cancer (age of onset: 22) in an other family member; Breast cancer (age of onset: 97) in her maternal aunt; Breast cancer (age of onset: 63) in her paternal aunt; Depression in her father; Diverticulitis in her father; Heart attack in her maternal grandfather; Heart disease (age of onset: 73) in her mother; Hernia in her father; Hypertension in her brother and mother; Hypothyroidism in her daughter, daughter, sister, and sister; Ovarian cancer in her sister and sister; Stroke in her maternal grandfather and  paternal grandmother; Uterine cancer (age of onset: 44) in her sister.,  reports that she has never smoked. She has never used smokeless tobacco. She reports that she does not drink alcohol or use drugs.  She has a current medication list which includes the following prescription(s): amoxicillin, aspirin ec, clobetasol cream, dupixent, eucrisa, fluticasone, hydroxyzine, ibuprofen, levothyroxine, losartan, oxycodone-acetaminophen, and prednisone. Also, is allergic to scopolamine.  Review of Systems  Constitutional: Negative for chills, fever and malaise/fatigue.  HENT: Negative for congestion, sinus pain and sore throat.   Eyes: Negative for blurred vision and pain.  Respiratory: Negative for cough and wheezing.   Cardiovascular: Negative for chest pain and leg swelling.  Gastrointestinal: Negative for abdominal pain, constipation, diarrhea, heartburn, nausea and vomiting.  Genitourinary: Negative for dysuria, frequency, hematuria and urgency.  Musculoskeletal: Negative for back pain, joint pain, myalgias and neck pain.  Skin: Negative for itching and rash.  Neurological: Negative for dizziness, tremors and weakness.  Endo/Heme/Allergies: Does not bruise/bleed easily.  Psychiatric/Behavioral: Negative for depression. The patient is not nervous/anxious and does not have insomnia.     Objective: BP 140/90    Ht 5' 7"  (1.702 m)    Wt 211 lb (95.7 kg)    BMI 33.05 kg/m  Physical Exam Constitutional:      General: She is not in acute distress.    Appearance: Normal appearance. She is well-developed.  Cardiovascular:     Rate and Rhythm: Normal rate and regular rhythm.     Pulses: Normal pulses.     Heart sounds: Normal heart sounds. No murmur. No friction rub. No gallop.   Pulmonary:     Effort: Pulmonary effort is normal.     Breath sounds: Normal breath sounds.  Chest:     Chest  wall: No mass, tenderness or edema.     Breasts:        Right: No inverted nipple, mass, nipple discharge,  skin change or tenderness.        Left: No inverted nipple, mass, nipple discharge, skin change or tenderness.     Comments: No mass, asymmetry, skin change; min T in UIQ of right breast to palpation Musculoskeletal: Normal range of motion.  Lymphadenopathy:     Upper Body:     Right upper body: No pectoral adenopathy.     Left upper body: No pectoral adenopathy.  Neurological:     Mental Status: She is alert and oriented to person, place, and time.  Skin:    General: Skin is warm and dry.     Findings: No abrasion, bruising, erythema, lesion or rash.  Psychiatric:        Speech: Speech normal.        Behavior: Behavior normal.        Judgment: Judgment normal.  Vitals signs reviewed.     ASSESSMENT/PLAN: normal breast exam 1. Breast pain, right Likely nerve healing from biopsy  No real treatment options needed at this time Allow time for healing   Barnett Applebaum, MD, [facility], [facility] 11/30/2018  11:22 AM

## 2018-12-02 ENCOUNTER — Other Ambulatory Visit: Payer: Self-pay

## 2018-12-02 ENCOUNTER — Ambulatory Visit (INDEPENDENT_AMBULATORY_CARE_PROVIDER_SITE_OTHER): Payer: BC Managed Care – PPO | Admitting: Podiatry

## 2018-12-02 ENCOUNTER — Encounter: Payer: Self-pay | Admitting: Podiatry

## 2018-12-02 DIAGNOSIS — T8484XA Pain due to internal orthopedic prosthetic devices, implants and grafts, initial encounter: Secondary | ICD-10-CM

## 2018-12-02 DIAGNOSIS — Z9889 Other specified postprocedural states: Secondary | ICD-10-CM

## 2018-12-04 NOTE — Progress Notes (Signed)
   Subjective:  Patient presents today status post left ankle arthroscopy. DOS: 11/16/2018. She states she is doing better. She reports some mild soreness and stiffness but states it has improved since her last visit. She has been using the CAM boot as directed. She denies any modifying factors. Patient is here for further evaluation and treatment.    Past Medical History:  Diagnosis Date  . Anemia    H/O  . Anxiety   . BRCA negative 08/2018   MyRisk neg except NTHL1 VUS  . Complication of anesthesia   . Family history of breast cancer 08/2018   IBIS=8.65/riskscore=7.9%  . Family history of ovarian cancer    MyRisk neg except NTHL1 VUS  . GERD (gastroesophageal reflux disease)    NO MEDS  . Hyperlipidemia   . Hypertension   . Hypothyroidism    Hashimoto's per patient  . IFG (impaired fasting glucose) 08/05/2015  . Metabolic syndrome 10/13/2016  . Palpitations 2012   Evaluated by Timothy Gollan, MD, Holter moniter  . PONV (postoperative nausea and vomiting)   . Rectal mass   . Thyroid disease    unspecified hypothyroidism      Objective/Physical Exam Neurovascular status intact.  Skin incisions appear to be well coapted with sutures and staples intact. No sign of infectious process noted. No dehiscence. No active bleeding noted. Moderate edema noted to the surgical extremity.  Assessment: 1. s/p left ankle arthroscopy. DOS: 11/16/2018   Plan of Care:  1. Patient was evaluated.  2. Sutures removed.  3. Recommended good shoe gear.  4. Compression anklet dispensed.  5. Return to clinic in 4 weeks.    Brent M. Evans, DPM [facility]  Dr. Brent M. Evans, DPM    [address]                                        [place], [place] [address]                Office [phone]  Fax [phone]      

## 2018-12-20 ENCOUNTER — Other Ambulatory Visit: Payer: BC Managed Care – PPO

## 2018-12-27 ENCOUNTER — Other Ambulatory Visit: Payer: BC Managed Care – PPO

## 2018-12-27 ENCOUNTER — Encounter: Payer: Self-pay | Admitting: Podiatry

## 2018-12-27 ENCOUNTER — Other Ambulatory Visit: Payer: Self-pay

## 2018-12-27 ENCOUNTER — Ambulatory Visit (INDEPENDENT_AMBULATORY_CARE_PROVIDER_SITE_OTHER): Payer: BC Managed Care – PPO

## 2018-12-27 ENCOUNTER — Ambulatory Visit (INDEPENDENT_AMBULATORY_CARE_PROVIDER_SITE_OTHER): Payer: BC Managed Care – PPO | Admitting: Podiatry

## 2018-12-27 DIAGNOSIS — Z9889 Other specified postprocedural states: Secondary | ICD-10-CM | POA: Diagnosis not present

## 2018-12-27 DIAGNOSIS — T8484XA Pain due to internal orthopedic prosthetic devices, implants and grafts, initial encounter: Secondary | ICD-10-CM

## 2018-12-27 DIAGNOSIS — M659 Synovitis and tenosynovitis, unspecified: Secondary | ICD-10-CM

## 2018-12-27 MED ORDER — MELOXICAM 15 MG PO TABS: 15.0000 mg | ORAL_TABLET | Freq: Every day | ORAL | 2 refills | Status: DC

## 2018-12-30 ENCOUNTER — Telehealth: Payer: Self-pay

## 2018-12-30 ENCOUNTER — Other Ambulatory Visit: Payer: Self-pay | Admitting: Obstetrics & Gynecology

## 2018-12-30 DIAGNOSIS — N644 Mastodynia: Secondary | ICD-10-CM

## 2018-12-30 NOTE — Telephone Encounter (Signed)
Let her know I will set up referral for her to a breast specialist, ask her if Dr Celine Ahr is an option for her here in [place].

## 2018-12-30 NOTE — Progress Notes (Signed)
   Subjective:  Patient presents today status post left ankle arthroscopy. DOS: 11/16/2018. She states she is doing well but reports some pain to the lateral aspect of the left foot and ankle. She reports some continued associated swelling. She has been using the compression anklet as directed. Patient is here for further evaluation and treatment.    Past Medical History:  Diagnosis Date  . Anemia    H/O  . Anxiety   . BRCA negative 08/2018   MyRisk neg except NTHL1 VUS  . Complication of anesthesia   . Family history of breast cancer 08/2018   IBIS=8.65/riskscore=7.9%  . Family history of ovarian cancer    MyRisk neg except NTHL1 VUS  . GERD (gastroesophageal reflux disease)    NO MEDS  . Hyperlipidemia   . Hypertension   . Hypothyroidism    Hashimoto's per patient  . IFG (impaired fasting glucose) 08/05/2015  . Metabolic syndrome 0/52/5910  . Palpitations 2012   Evaluated by Ida Rogue, MD, Holter moniter  . PONV (postoperative nausea and vomiting)   . Rectal mass   . Thyroid disease    unspecified hypothyroidism      Objective/Physical Exam Neurovascular status intact.  Skin incisions appear to be well coapted. No sign of infectious process noted. No dehiscence. No active bleeding noted. Moderate edema noted to the surgical extremity. Pain with palpation noted to the anterior, lateral and medial aspects of the left ankle joint.   Radiographic Exam:  Orthopedic hardware and osteotomies sites appear to be stable with routine healing.   Assessment: 1. s/p left ankle arthroscopy. DOS: 11/16/2018 2. Ankle synovitis left    Plan of Care:  1. Patient was evaluated. X-Rays reviewed.  2. Return to work on 01/02/2019.  3. Prescription for Meloxicam 15 mg provided to patient. 4. Recommended good shoe gear.  5. Return to clinic in 4 weeks.     Edrick Kins, DPM [facility]  Dr. Edrick Kins, [address]                                         [place], [place] [address]                Office [phone]  Fax [phone]

## 2018-12-30 NOTE — Telephone Encounter (Signed)
Pt states she doesn't know anything about Dr. Celine Ahr but trusts your judgement.

## 2018-12-30 NOTE — Telephone Encounter (Signed)
Please advise 

## 2018-12-30 NOTE — Telephone Encounter (Signed)
Pt calling to request a referral to an oncologist.  She had a mammogram, then a bx.  She is still having pain and tenderness at the bx site.  Would like to further check in to this.  [phone]

## 2019-01-02 ENCOUNTER — Other Ambulatory Visit: Payer: Self-pay | Admitting: Obstetrics & Gynecology

## 2019-01-02 DIAGNOSIS — N644 Mastodynia: Secondary | ICD-10-CM

## 2019-01-02 NOTE — Telephone Encounter (Signed)
Pt called back returning phone call. 

## 2019-01-02 NOTE — Telephone Encounter (Signed)
Pt mailbox is full

## 2019-01-02 NOTE — Telephone Encounter (Signed)
Pt aware.

## 2019-01-05 ENCOUNTER — Ambulatory Visit: Payer: BC Managed Care – PPO | Admitting: General Surgery

## 2019-01-06 ENCOUNTER — Ambulatory Visit: Payer: BC Managed Care – PPO | Admitting: Family Medicine

## 2019-01-06 ENCOUNTER — Encounter: Payer: Self-pay | Admitting: Family Medicine

## 2019-01-06 ENCOUNTER — Other Ambulatory Visit: Payer: Self-pay

## 2019-01-06 VITALS — BP 124/86 | HR 86 | Temp 97.9°F | Resp 16 | Ht 67.0 in | Wt 216.2 lb

## 2019-01-06 DIAGNOSIS — E785 Hyperlipidemia, unspecified: Secondary | ICD-10-CM

## 2019-01-06 DIAGNOSIS — L308 Other specified dermatitis: Secondary | ICD-10-CM | POA: Diagnosis not present

## 2019-01-06 DIAGNOSIS — Z6833 Body mass index (BMI) 33.0-33.9, adult: Secondary | ICD-10-CM

## 2019-01-06 DIAGNOSIS — I10 Essential (primary) hypertension: Secondary | ICD-10-CM

## 2019-01-06 DIAGNOSIS — E8881 Metabolic syndrome: Secondary | ICD-10-CM

## 2019-01-06 DIAGNOSIS — E6609 Other obesity due to excess calories: Secondary | ICD-10-CM

## 2019-01-06 DIAGNOSIS — E039 Hypothyroidism, unspecified: Secondary | ICD-10-CM | POA: Diagnosis not present

## 2019-01-06 MED ORDER — TRIAMCINOLONE ACETONIDE 0.025 % EX OINT: 1.0000 "application " | TOPICAL_OINTMENT | Freq: Two times a day (BID) | CUTANEOUS | 1 refills | Status: DC | PRN

## 2019-01-06 MED ORDER — DESONIDE 0.05 % EX LOTN: TOPICAL_LOTION | Freq: Two times a day (BID) | CUTANEOUS | 1 refills | Status: DC

## 2019-01-06 MED ORDER — EUCRISA 2 % EX OINT: 1.0000 "application " | TOPICAL_OINTMENT | Freq: Every day | CUTANEOUS | 1 refills | Status: DC | PRN

## 2019-01-06 NOTE — Patient Instructions (Signed)
HDL removes extra cholesterol and plaque buildup in your arteries and then sends it to your liver to remove it from your body; this helps reduce your risk of heart disease, heart attack, and stroke.  Foods that increase HDL include beans and legumes, whole grains, high-fiber fruits:prunes, apples, and pears; fatty fish- salmon, tuna, sardines; nuts, olive oil.  Your LDL is above normal.  The LDL is the "lousy" or bad cholesterol. Over time and in combination with inflammation and other factors, this contributes to plaque which in turn may lead to stroke and/or heart attack down the road.  Sometimes high LDL is primarily genetic, and people might be eating all the right foods but still have high numbers.  Other times, there is room for improvement in one's diet and eating healthier can bring this number down and potentially reduce one's risk of heart attack and/or stroke.  Your LDL level should be below 100. If you have diabetes or a possible heart problem, your LDL should be below 70.  Some strategies to focus on to help improve your LDL levels:  - Eat 20 to 30 grams of fiber every day.  - Eat Foods such as fruits and vegetables, whole grains, beans, peas, nuts, and seeds can help lower LDL. - Avoid Saturated fats - Dairy foods - such as butter, cream, ghee, regular-fat milk and cheese. Meat - such as fatty cuts of beef, pork and lamb, processed meats like salami, sausages and the skin on chicken. Lard., fatty snack foods, cakes, biscuits, pies and deep fried foods) - Avoid smoking    Fat and Cholesterol Restricted Eating Plan Getting too much fat and cholesterol in your diet may cause health problems. Choosing the right foods helps keep your fat and cholesterol at normal levels. This can keep you from getting certain diseases. Your doctor may recommend an eating plan that includes:  Total fat: ______% or less of total calories a day.  Saturated fat: ______% or less of total calories a day.   Cholesterol: less than _________mg a day.  Fiber: ______g a day. What are tips for following this plan? Meal planning  At meals, divide your plate into four equal parts: ? Fill one-half of your plate with vegetables and green salads. ? Fill one-fourth of your plate with whole grains. ? Fill one-fourth of your plate with low-fat (lean) protein foods.  Eat fish that is high in omega-3 fats at least two times a week. This includes mackerel, tuna, sardines, and salmon.  Eat foods that are high in fiber, such as whole grains, beans, apples, broccoli, carrots, peas, and barley. General tips   Work with your doctor to lose weight if you need to.  Avoid: ? Foods with added sugar. ? Fried foods. ? Foods with partially hydrogenated oils.  Limit alcohol intake to no more than 1 drink a day for nonpregnant women and 2 drinks a day for men. One drink equals 12 oz of beer, 5 oz of wine, or 1 oz of hard liquor. Reading food labels  Check food labels for: ? Trans fats. ? Partially hydrogenated oils. ? Saturated fat (g) in each serving. ? Cholesterol (mg) in each serving. ? Fiber (g) in each serving.  Choose foods with healthy fats, such as: ? Monounsaturated fats. ? Polyunsaturated fats. ? Omega-3 fats.  Choose grain products that have whole grains. Look for the word "whole" as the first word in the ingredient list. Cooking  Cook foods using low-fat methods. These include baking, boiling,  grilling, and broiling.  Eat more home-cooked foods. Eat at restaurants and buffets less often.  Avoid cooking using saturated fats, such as butter, cream, palm oil, palm kernel oil, and coconut oil. Recommended foods  Fruits  All fresh, canned (in natural juice), or frozen fruits. Vegetables  Fresh or frozen vegetables (raw, steamed, roasted, or grilled). Green salads. Grains  Whole grains, such as whole wheat or whole grain breads, crackers, cereals, and pasta. Unsweetened oatmeal,  bulgur, barley, quinoa, or brown rice. Corn or whole wheat flour tortillas. Meats and other protein foods  Ground beef (85% or leaner), grass-fed beef, or beef trimmed of fat. Skinless chicken or [place]. Ground chicken or [place]. Pork trimmed of fat. All fish and seafood. Egg whites. Dried beans, peas, or lentils. Unsalted nuts or seeds. Unsalted canned beans. Nut butters without added sugar or oil. Dairy  Low-fat or nonfat dairy products, such as skim or 1% milk, 2% or reduced-fat cheeses, low-fat and fat-free ricotta or cottage cheese, or plain low-fat and nonfat yogurt. Fats and oils  Tub margarine without trans fats. Light or reduced-fat mayonnaise and salad dressings. Avocado. Olive, canola, sesame, or safflower oils. The items listed above may not be a complete list of foods and beverages you can eat. Contact a dietitian for more information. Foods to avoid Fruits  Canned fruit in heavy syrup. Fruit in cream or butter sauce. Fried fruit. Vegetables  Vegetables cooked in cheese, cream, or butter sauce. Fried vegetables. Grains  White bread. White pasta. White rice. Cornbread. Bagels, pastries, and croissants. Crackers and snack foods that contain trans fat and hydrogenated oils. Meats and other protein foods  Fatty cuts of meat. Ribs, chicken wings, bacon, sausage, bologna, salami, chitterlings, fatback, hot dogs, bratwurst, and packaged lunch meats. Liver and organ meats. Whole eggs and egg yolks. Chicken and [place] with skin. Fried meat. Dairy  Whole or 2% milk, cream, half-and-half, and cream cheese. Whole milk cheeses. Whole-fat or sweetened yogurt. Full-fat cheeses. Nondairy creamers and whipped toppings. Processed cheese, cheese spreads, and cheese curds. Beverages  Alcohol. Sugar-sweetened drinks such as sodas, lemonade, and fruit drinks. Fats and oils  Butter, stick margarine, lard, shortening, ghee, or bacon fat. Coconut, palm kernel, and palm oils. Sweets and desserts   Corn syrup, sugars, honey, and molasses. Candy. Jam and jelly. Syrup. Sweetened cereals. Cookies, pies, cakes, donuts, muffins, and ice cream. The items listed above may not be a complete list of foods and beverages you should avoid. Contact a dietitian for more information. Summary  Choosing the right foods helps keep your fat and cholesterol at normal levels. This can keep you from getting certain diseases.  At meals, fill one-half of your plate with vegetables and green salads.  Eat high-fiber foods, like whole grains, beans, apples, carrots, peas, and barley.  Limit added sugar, saturated fats, alcohol, and fried foods. This information is not intended to replace advice given to you by your health care provider. Make sure you discuss any questions you have with your health care provider. Document Released: 09/01/2011 Document Revised: 11/03/2017 Document Reviewed: 11/17/2016 Elsevier Patient Education  2020 [employer].

## 2019-01-06 NOTE — Progress Notes (Signed)
Name: Desiree Walker   MRN: [id number]    DOB: 02-26-1961   Date:01/06/2019       Progress Note  Subjective  Chief Complaint  Chief Complaint  Patient presents with  . Hypothyroidism    medication refill  . Rash    side of ear, chin from mask  . Follow-up    pe diabetic    HPI  Hypothyroidism Desiree Walker is a 58 y.o. female who presents for follow up of hypothyroidism. Current symptoms: none . Patient denies diarrhea, heat / cold intolerance, nervousness, palpitations and weight changes. Symptoms have been well-controlled. Was seeing endocrinology, but lost follow up; we will check labs today.  She notes she has been on brand synthroid since the 90's, states she was on generic initially and things would not stabilize; she notes brand is quite expensive and would like to try to switch back to generic. Discussed typical course when changing manufacturers - she would like to talk with her pharmacy first, and then call us back if she wants to proceed.  Atopic Dermatitis On her face right now - thinks may be from mask wearing - has slight area just under the left eye with some erythema and some scaling; also has patch on the LEFT cheek and tragus and behind the right ear. Also has a breakout on her hands - she has to wear gloves all day during work.   Prediabetes/Metabolic Syndrome/Obesity She is due for labs.  Denies polyuria, polydipsia, or polyphagia. Walks at work a lot, but no dedicated exercise.  Her diet is a lot better recently because her husband was diagnosed with T2DM. Trying to eat more steamed vegetables, salads, no longer drinking sodas; increasing her complex carbs, more fruit/vegetables.    HTN Taking losartan and tolerating well; no lightheadedness, dizziness, chest pain, shortness of breath, BLE edema.  No concerns today.  HLD Working on diet; no medication indicated at this time. Denies chest pain. The 10-year ASCVD risk score Mikey Bussing DC Brooke Bonito., et al., 2013) is: 4.3%    Values used to calculate the score:     Age: 58 years     Sex: Female     Is Non-Hispanic African American: No     Diabetic: No     Tobacco smoker: No     Systolic Blood Pressure: A999333 mmHg     Is BP treated: Yes     HDL Cholesterol: 49 mg/dL     Total Cholesterol: 216 mg/dL   Patient Active Problem List   Diagnosis Date Noted  . Family history of breast cancer 06/02/2018  . Family history of ovarian cancer 06/02/2018  . Eczema 06/10/2017  . Preventative health care 01/15/2017  . History of uterine fibroid 10/23/2016  . Metabolic syndrome [id number]  . Medication monitoring encounter 10/13/2016  . Pressure in chest 12/17/2015  . Anxiety disorder 12/17/2015  . Obesity 12/17/2015  . Sleep apnea 08/29/2015  . Snoring 08/27/2015  . Vitamin D deficiency 08/19/2015  . Leg cramps 08/05/2015  . IFG (impaired fasting glucose) 08/05/2015  . Anal fistula   . Other specified symptoms and signs involving the digestive system and abdomen 10/31/2014  . 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 Bladder neoplasm of uncertain malignant potential 06/09/2013  . Female genuine stress incontinence 06/05/2013  . Incomplete bladder emptying 06/05/2013  . Urge incontinence 06/05/2013  . Dysuria 06/05/2013  . Other chronic cystitis without hematuria 06/05/2013  . Retention of urine 06/05/2013  . Chronic RLQ pain  07/27/2012  . Palpitations 09/11/2010  . Hypothyroidism, adult 05/10/2009  . Hyperlipidemia LDL goal <130 05/10/2009  . Hypertension goal BP (blood pressure) < 140/90 05/10/2009    Past Surgical History:  Procedure Laterality Date  . ANAL FISTULOTOMY N/A 11/30/2014   Procedure: ANAL FISTULOTOMY;  Surgeon: Dia Crawford III, MD;  Location: [facility] ORS;  Service: General;  Laterality: N/A;  . BREAST BIOPSY Right 09/08/2018   affirm bx of mass, coil clip, path pending  . CESAREAN SECTION    . COLONOSCOPY  2014  . DILATION AND CURETTAGE OF UTERUS    . ENDOMETRIAL ABLATION W/  NOVASURE  05/29/2009   Dr Kenton Kingfisher  . left foot surgery    . RECTAL EXAM UNDER ANESTHESIA N/A 11/30/2014   Procedure: RECTAL EXAM UNDER ANESTHESIA;  Surgeon: Dia Crawford III, MD;  Location: [facility] ORS;  Service: General;  Laterality: N/A;    Family History  Problem Relation Age of Onset  . Hypertension Mother   . Heart disease Mother 99       "blockages"  . Alzheimer's disease Mother   . Diverticulitis Father   . Hernia Father   . Depression Father   . Alzheimer's disease Father   . Hypothyroidism Sister   . Uterine cancer Sister 4  . Ovarian cancer Sister   . Breast cancer Maternal Aunt 13  . Hypothyroidism Daughter   . Hypertension Brother   . Stroke Maternal Grandfather   . Heart attack Maternal Grandfather   . Stroke Paternal Grandmother   . Hypothyroidism Sister   . Ovarian cancer Sister   . Hypothyroidism Daughter   . Breast cancer Paternal Aunt 79  . Breast cancer Other 30    Social History   Socioeconomic History  . Marital status: Married    Spouse name: Herbie [place] A. Backlund  . Number of children: 3  . Years of education: Not on file  . Highest education level: Associate degree: occupational, Hotel manager, or vocational program  Occupational History  . Occupation: Insurance underwriter Pediatrics  Social Needs  . Financial resource strain: Not hard at all  . Food insecurity    Worry: Never true    Inability: Never true  . Transportation needs    Medical: No    Non-medical: No  Tobacco Use  . Smoking status: Never Smoker  . Smokeless tobacco: Never Used  Substance and Sexual Activity  . Alcohol use: No    Alcohol/week: 0.0 standard drinks  . Drug use: No  . Sexual activity: Yes    Birth control/protection: Post-menopausal  Lifestyle  . Physical activity    Days per week: 0 days    Minutes per session: 0 min  . Stress: Not at all  Relationships  . Social connections    Talks on phone: More than three times a week    Gets together: More than three times a week     Attends religious service: Never    Active member of club or organization: No    Attends meetings of clubs or organizations: Never    Relationship status: Married  . Intimate partner violence    Fear of current or ex partner: No    Emotionally abused: No    Physically abused: No    Forced sexual activity: No  Other Topics Concern  . Not on file  Social History Narrative  . Not on file     Current Outpatient Medications:  Marland Kitchen  DUPIXENT 300 MG/2ML prefilled syringe, , Disp: , Rfl:  .  fluticasone (FLONASE) 50 MCG/ACT nasal spray, Place 2 sprays into both nostrils daily., Disp: 16 g, Rfl: 6 .  ibuprofen (ADVIL) 800 MG tablet, Take 1 tablet (800 mg total) by mouth 3 (three) times daily., Disp: 60 tablet, Rfl: 0 .  levothyroxine (SYNTHROID) 112 MCG tablet, TAKE ONE TABLET DAILY. EVERY SUNDAY TAKE AN EXTRA ONE-HALF TABLET., Disp: 90 tablet, Rfl: 3 .  losartan (COZAAR) 50 MG tablet, TAKE 1.5 TABLETS (75 MG TOTAL) BY MOUTH DAILY., Disp: 135 tablet, Rfl: 1 .  amoxicillin (AMOXIL) 500 MG capsule, TAKE 2 TABLETS BY MOUTH STAT THEN 1 TABLET 3 TIMES A DAY UNTIL GONE ONLY IF PRESSURE/SWELLING/PAIN, Disp: , Rfl:  .  aspirin EC 81 MG tablet, Take 81 mg by mouth once. , Disp: , Rfl:  .  clobetasol cream (TEMOVATE) 0.05 %, APPLY TO AFFECTED AREA EVERY DAY, Disp: , Rfl:  .  EUCRISA 2 % OINT, Apply 1 application topically daily as needed. (Patient not taking: Reported on 01/06/2019), Disp: 60 g, Rfl: 1 .  hydrOXYzine (ATARAX/VISTARIL) 10 MG tablet, Take 5 mg by mouth as needed., Disp: , Rfl:  .  meloxicam (MOBIC) 15 MG tablet, Take 1 tablet (15 mg total) by mouth daily. (Patient not taking: Reported on 01/06/2019), Disp: 30 tablet, Rfl: 2 .  oxyCODONE-acetaminophen (PERCOCET) 5-325 MG tablet, Take 1 tablet by mouth every 6 (six) hours as needed for severe pain. (Patient not taking: Reported on 01/06/2019), Disp: 30 tablet, Rfl: 0 .  predniSONE (STERAPRED UNI-PAK 21 TAB) 10 MG (21) TBPK tablet, Take by  mouth daily. Take as directed (Patient not taking: Reported on 01/06/2019), Disp: 21 tablet, Rfl: 0  Allergies  Allergen Reactions  . Scopolamine Other (See Comments)    "[place]"    I personally reviewed active problem list, medication list, allergies, notes from last encounter, lab results with the patient/caregiver today.   ROS  Constitutional: Negative for fever or weight change.  Respiratory: Negative for cough and shortness of breath.   Cardiovascular: Negative for chest pain or palpitations.  Gastrointestinal: Negative for abdominal pain, no bowel changes.  Musculoskeletal: Negative for gait problem or joint swelling.  Skin: Positive for rash.  Neurological: Negative for dizziness or headache.  No other specific complaints in a complete review of systems (except as listed in HPI above).   Objective  Vitals:   01/06/19 1051  BP: 124/86  Pulse: 86  Resp: 16  Temp: 97.9 F (36.6 C)  TempSrc: Temporal  SpO2: 99%  Weight: 216 lb 3.2 oz (98.1 kg)  Height: 5\' 7"  (1.702 m)    Body mass index is 33.86 kg/m.  Physical Exam  Constitutional: Patient appears well-developed and well-nourished. No distress.  HENT: Head: Normocephalic and atraumatic.  Eyes: Conjunctivae and EOM are normal. No scleral icterus.  Neck: Normal range of motion. Neck supple. No JVD present. No thyromegaly present.  Cardiovascular: Normal rate, regular rhythm and normal heart sounds.  No murmur heard. No BLE edema. Pulmonary/Chest: Effort normal and breath sounds normal. No respiratory distress. Musculoskeletal: Normal range of motion, no joint effusions. No gross deformities Neurological: Pt is alert and oriented to person, place, and time. No cranial nerve deficit. Coordination, balance, strength, speech and gait are normal.  Skin: Skin is warm and dry. There is a small area just under the left eye with some erythema and some scaling; also has patch on the LEFT cheek and  tragus and behind the right ear. Psychiatric: Patient has a normal mood and  affect. behavior is normal. Judgment and thought content normal.  No results found for this or any previous visit (from the past 72 hour(s)).  PHQ2/9: Depression screen [facility] 2/9 01/06/2019 10/25/2018 08/19/2018 06/07/2018 01/19/2018  Decreased Interest 0 0 0 0 0  Down, Depressed, Hopeless 0 0 0 0 0  PHQ - 2 Score 0 0 0 0 0  Altered sleeping 0 0 0 0 1  Tired, decreased energy 0 0 0 0 1  Change in appetite 1 0 0 0 0  Feeling bad or failure about yourself  0 0 0 0 0  Trouble concentrating 0 0 0 0 0  Moving slowly or fidgety/restless 0 0 0 0 0  Suicidal thoughts 0 0 0 0 0  PHQ-9 Score 1 0 0 0 2  Difficult doing work/chores Not difficult at all - Not difficult at all Not difficult at all -   PHQ-2/9 Result is negative.    Fall Risk: Fall Risk  01/06/2019 10/25/2018 08/19/2018 06/07/2018 01/19/2018  Falls in the past year? 0 0 0 0 0  Number falls in past yr: 0 0 0 0 0  Injury with Fall? 0 0 0 0 -  Follow up Falls evaluation completed - - Falls evaluation completed -   Assessment & Plan  1. Hypothyroidism, adult - TSH  2. Other eczema - Will follow up with dermatology as well - EUCRISA 2 % OINT; Apply 1 application topically daily as needed.  Dispense: 60 g; Refill: 1 - triamcinolone (KENALOG) 0.025 % ointment; Apply 1 application topically 2 (two) times daily as needed. For your hands - use for maximum 2 weeks; not for face, armpits, breasts, or genitals.  Dispense: 30 g; Refill: 1 - desonide (DESOWEN) 0.05 % lotion; Apply topically 2 (two) times daily. Use on face; avoid eyes.  Use for max 2-3 weeks at a time.  Dispense: 59 mL; Refill: 1  3. Metabolic syndrome - Lipid panel - COMPLETE METABOLIC PANEL WITH GFR - Hemoglobin A1c  4. Class 1 obesity due to excess calories without serious comorbidity with body mass index (BMI) of 33.0 to 33.9 in adult - Discussed importance of 150 minutes of physical activity weekly,  eat two servings of fish weekly, eat one serving of tree nuts ( cashews, pistachios, pecans, almonds.Marland Kitchen) every other day, eat 6 servings of fruit/vegetables daily and drink plenty of water and avoid sweet beverages.  - Lipid panel - TSH - COMPLETE METABOLIC PANEL WITH GFR  5. Hyperlipidemia LDL goal <130 - Discussed dietary changes at length. - Lipid panel  6. Hypertension goal BP (blood pressure) < 140/90 - COMPLETE METABOLIC PANEL WITH GFR

## 2019-01-07 LAB — LIPID PANEL
Cholesterol: 179 mg/dL (ref <200)
HDL: 45 mg/dL — ABNORMAL LOW (ref >=50)
LDL Cholesterol (Calc): 115 mg/dL (calc) — ABNORMAL HIGH
Non-HDL Cholesterol (Calc): 134 mg/dL (calc) — ABNORMAL HIGH (ref <130)
Total CHOL/HDL Ratio: 4 (calc) (ref <5.0)
Triglycerides: 89 mg/dL (ref <150)

## 2019-01-07 LAB — COMPLETE METABOLIC PANEL WITH GFR
AG Ratio: 1.6 (calc) (ref 1.0–2.5)
ALT: 13 U/L (ref 6–29)
AST: 15 U/L (ref 10–35)
Albumin: 4 g/dL (ref 3.6–5.1)
Alkaline phosphatase (APISO): 75 U/L (ref 37–153)
BUN: 19 mg/dL (ref 7–25)
CO2: 27 mmol/L (ref 20–32)
Calcium: 9 mg/dL (ref 8.6–10.4)
Chloride: 107 mmol/L (ref 98–110)
Creat: 0.81 mg/dL (ref 0.50–1.05)
GFR, Est African American: 92 mL/min/{1.73_m2} (ref >=60)
GFR, Est Non African American: 79 mL/min/{1.73_m2} (ref >=60)
Globulin: 2.5 g/dL (calc) (ref 1.9–3.7)
Glucose, Bld: 108 mg/dL — ABNORMAL HIGH (ref 65–99)
Potassium: 4.5 mmol/L (ref 3.5–5.3)
Sodium: 139 mmol/L (ref 135–146)
Total Bilirubin: 0.3 mg/dL (ref 0.2–1.2)
Total Protein: 6.5 g/dL (ref 6.1–8.1)

## 2019-01-07 LAB — HEMOGLOBIN A1C
Hgb A1c MFr Bld: 5.8 % of total Hgb — ABNORMAL HIGH (ref <5.7)
Mean Plasma Glucose: 120 (calc)
eAG (mmol/L): 6.6 (calc)

## 2019-01-07 LAB — TSH: TSH: 2.74 mIU/L (ref 0.40–4.50)

## 2019-01-12 ENCOUNTER — Ambulatory Visit (INDEPENDENT_AMBULATORY_CARE_PROVIDER_SITE_OTHER): Payer: BC Managed Care – PPO | Admitting: General Surgery

## 2019-01-12 ENCOUNTER — Encounter: Payer: Self-pay | Admitting: General Surgery

## 2019-01-12 ENCOUNTER — Other Ambulatory Visit: Payer: Self-pay

## 2019-01-12 VITALS — BP 149/82 | HR 101 | Temp 97.2°F | Ht 67.0 in | Wt 215.6 lb

## 2019-01-12 DIAGNOSIS — N644 Mastodynia: Secondary | ICD-10-CM

## 2019-01-12 DIAGNOSIS — Z9889 Other specified postprocedural states: Secondary | ICD-10-CM | POA: Diagnosis not present

## 2019-01-12 NOTE — Progress Notes (Signed)
Patient ID: Desiree Walker, female   DOB: 04-01-1960, 58 y.o.   MRN: [id number]  Chief Complaint  Patient presents with  . New Patient (Initial Visit)    breast pain    HPI Desiree Walker is a 58 y.o. female.   She was referred by her OB/GYN, Dr. Kenton Kingfisher, for evaluation of breast pain.  In June, Desiree Walker underwent a stereotactic core biopsy of a breast mass that was appreciated on her annual screening mammogram.  The biopsy was benign on final pathology.  She has continued to have discomfort in the area of the biopsy since that time.  She states that she has not taken anything or really done anything to help with the pain.  It is not constant.  The pain comes in twinges, she says, and when it occurs, the pain is roughly a 3-4 on a 1-10 scale.  She states that she is mostly "just scared" as she knows several people who have had breast cancer, including her brother's daughter who recently had bilateral mastectomies.  Desiree Walker reports menarche between ages [age] and underwent menopause at age 29.  She had 4 pregnancies but did not breast-feed any of her children.  She has not taken any hormone replacement therapy or oral contraceptives.  She reports that her mother's sister had breast cancer.  She has been tested and is BRCA negative.  She does not appreciate a lump in the area.  She has not noticed any skin changes or any nipple discharge.  She says that she is not consistent with monthly exams.  She consumes approximately 3 cups of regular coffee daily.   Past Medical History:  Diagnosis Date  . Anemia    H/O  . Anxiety   . BRCA negative 08/2018   MyRisk neg except NTHL1 VUS  . Complication of anesthesia   . Family history of breast cancer 08/2018   IBIS=8.65/riskscore=7.9%  . Family history of ovarian cancer    MyRisk neg except NTHL1 VUS  . GERD (gastroesophageal reflux disease)    NO MEDS  . Hyperlipidemia   . Hypertension   . Hypothyroidism    Hashimoto's per patient  . IFG (impaired  fasting glucose) 08/05/2015  . Metabolic syndrome 1/82/9937  . Palpitations 2012   Evaluated by Desiree Rogue, MD, Holter moniter  . PONV (postoperative nausea and vomiting)   . Rectal mass   . Thyroid disease    unspecified hypothyroidism    Past Surgical History:  Procedure Laterality Date  . ANAL FISTULOTOMY N/A 11/30/2014   Procedure: ANAL FISTULOTOMY;  Surgeon: Dia Crawford III, MD;  Location: [facility] ORS;  Service: General;  Laterality: N/A;  . BREAST BIOPSY Right 09/08/2018   affirm bx of mass, coil clip, path pending  . CESAREAN SECTION    . COLONOSCOPY  2014  . DILATION AND CURETTAGE OF UTERUS    . ENDOMETRIAL ABLATION W/ NOVASURE  05/29/2009   Dr Kenton Kingfisher  . left foot surgery    . RECTAL EXAM UNDER ANESTHESIA N/A 11/30/2014   Procedure: RECTAL EXAM UNDER ANESTHESIA;  Surgeon: Dia Crawford III, MD;  Location: [facility] ORS;  Service: General;  Laterality: N/A;    Family History  Problem Relation Age of Onset  . Hypertension Mother   . Heart disease Mother 76       "blockages"  . Alzheimer's disease Mother   . Diverticulitis Father   . Hernia Father   . Depression Father   . Alzheimer's disease Father   .  Hypothyroidism Sister   . Uterine cancer Sister 90  . Ovarian cancer Sister   . Breast cancer Maternal Aunt 50  . Hypothyroidism Daughter   . Hypertension Brother   . Stroke Maternal Grandfather   . Heart attack Maternal Grandfather   . Stroke Paternal Grandmother   . Hypothyroidism Sister   . Ovarian cancer Sister   . Hypothyroidism Daughter   . Breast cancer Paternal Aunt 46  . Breast cancer Other 30    Social History Social History   Tobacco Use  . Smoking status: Never Smoker  . Smokeless tobacco: Never Used  Substance Use Topics  . Alcohol use: No    Alcohol/week: 0.0 standard drinks  . Drug use: No    Allergies  Allergen Reactions  . Scopolamine Other (See Comments)    "[place]"    Current Outpatient Medications  Medication Sig  Dispense Refill  . aspirin EC 81 MG tablet Take 81 mg by mouth once.     Marland Kitchen desonide (DESOWEN) 0.05 % lotion Apply topically 2 (two) times daily. Use on face; avoid eyes.  Use for max 2-3 weeks at a time. 59 mL 1  . DUPIXENT 300 MG/2ML prefilled syringe Inject 300 mg into the skin every 14 (fourteen) days.     . EUCRISA 2 % OINT Apply 1 application topically daily as needed. 60 g 1  . levothyroxine (SYNTHROID) 112 MCG tablet TAKE ONE TABLET DAILY. EVERY SUNDAY TAKE AN EXTRA ONE-HALF TABLET. 90 tablet 3  . losartan (COZAAR) 50 MG tablet TAKE 1.5 TABLETS (75 MG TOTAL) BY MOUTH DAILY. 135 tablet 1  . triamcinolone (KENALOG) 0.025 % ointment Apply 1 application topically 2 (two) times daily as needed. For your hands - use for maximum 2 weeks; not for face, armpits, breasts, or genitals. 30 g 1   No current facility-administered medications for this visit.     Review of Systems Review of Systems  All other systems reviewed and are negative.   Blood pressure (!) 149/82, pulse (!) 101, temperature (!) 97.2 F (36.2 C), height 5' 7"  (1.702 m), weight 215 lb 9.6 oz (97.8 kg), SpO2 98 %. Body mass index is 33.77 kg/m.  Physical Exam Physical Exam Constitutional:      Appearance: Normal appearance. She is obese.     Comments: Anxious   HENT:     Head: Normocephalic and atraumatic.     Nose:     Comments: Covered with a mask secondary to COVID-19 precautions    Mouth/Throat:     Comments: Covered with a mask secondary to COVID-19 precautions Eyes:     General: No scleral icterus.       Right eye: No discharge.        Left eye: No discharge.  Neck:     Musculoskeletal: Normal range of motion.     Comments: No dominant thyroid masses appreciated. Cardiovascular:     Rate and Rhythm: Regular rhythm. Tachycardia present.     Pulses: Normal pulses.  Pulmonary:     Effort: Pulmonary effort is normal.     Breath sounds: Normal breath sounds.  Chest:       Comments: The breasts are large  and pendulous.  There is a small scar on her skin where the biopsy was performed.  The breast tissue is dense.  A couple of tiny, 1 to 2 mm cysts were palpated in the left breast.  No other masses or skin changes identified. Abdominal:  General: Bowel sounds are normal.     Palpations: Abdomen is soft.  Genitourinary:    Comments: Deferred Musculoskeletal:     Right lower leg: No edema.     Left lower leg: No edema.  Lymphadenopathy:     Cervical: No cervical adenopathy.     Upper Body:     Right upper body: No supraclavicular, axillary or pectoral adenopathy.     Left upper body: No supraclavicular, axillary or pectoral adenopathy.  Skin:    General: Skin is warm and dry.  Neurological:     General: No focal deficit present.     Mental Status: She is alert and oriented to person, place, and time.  Psychiatric:        Mood and Affect: Mood normal.        Behavior: Behavior normal.     Data Reviewed I reviewed the pre and postbiopsy mammograms.  The clip marking the spot for the biopsy is quite close to the chest wall.  Assessment This is a 58 year old woman who underwent a biopsy of a lesion seen on her annual screening mammography.  The biopsy was benign.  The lesion in question was quite close to the chest wall.  She has had pain since the time of her biopsy.  I suspect, that this is more chest wall pain in origin rather than actual mastalgia.   Plan I reassured her that I do not think any of these findings or her symptoms are at all suggestive of a malignant process.  I have recommended that she invest in several high-quality supportive bras to limit the weight of her breasts on her chest wall.  She should take ibuprofen 600 mg every 6 hours on a scheduled basis for 1 week, and then decrease to an as-needed basis.  She should also try to cut back on her caffeine consumption as this has been anecdotally related to breast pain.  She does not have any indication for surgery or  additional imaging at this time.  I also suggested that she set an alarm on her phone to remind her to perform her monthly breast exams.  She should continue with annual mammography and follow-up as usual with her regular providers.  We will see her on an as-needed basis.    Fredirick Maudlin 01/12/2019, 11:32 AM

## 2019-01-12 NOTE — Patient Instructions (Addendum)
You should take 600 mg(three over the counter tablets) of Ibuprofen every 6 hours daily for the next week. Then as needed.   Try to reduce your caffeine intake.    Continue self breast exams. Call office for any new breast issues or concerns.

## 2019-01-17 ENCOUNTER — Other Ambulatory Visit: Payer: Self-pay

## 2019-01-18 MED ORDER — LEVOTHYROXINE SODIUM 112 MCG PO TABS: ORAL_TABLET | ORAL | 3 refills | Status: AC

## 2019-01-20 ENCOUNTER — Other Ambulatory Visit: Payer: Self-pay

## 2019-01-20 ENCOUNTER — Telehealth: Payer: Self-pay | Admitting: Family Medicine

## 2019-01-20 ENCOUNTER — Ambulatory Visit: Payer: BC Managed Care – PPO | Admitting: Podiatry

## 2019-01-20 DIAGNOSIS — I10 Essential (primary) hypertension: Secondary | ICD-10-CM

## 2019-01-20 MED ORDER — LOSARTAN POTASSIUM 50 MG PO TABS: 75.0000 mg | ORAL_TABLET | Freq: Every day | ORAL | 1 refills | Status: DC

## 2019-01-20 NOTE — Telephone Encounter (Signed)
Pt wants to know if the triamcinolone (KENALOG) 0.025 % ointment can be changed to a cream.  Pt is noticing a lot of stains on sheets and she is having trouble wearing the ointment during the day. Pt uses  [id number] - [place], [place] [phone] (Phone) [phone] (Fax)

## 2019-01-23 NOTE — Telephone Encounter (Signed)
Left message for [facility] to change medication to a cream

## 2019-01-23 NOTE — Telephone Encounter (Signed)
Patient notified

## 2019-01-23 NOTE — Telephone Encounter (Signed)
May I call to give pharmacy a verbal to verify and change

## 2019-01-23 NOTE — Telephone Encounter (Signed)
Yes - may change to cream

## 2019-01-26 ENCOUNTER — Ambulatory Visit: Payer: BC Managed Care – PPO | Admitting: Family Medicine

## 2019-01-27 ENCOUNTER — Encounter: Payer: Self-pay | Admitting: Podiatry

## 2019-01-27 ENCOUNTER — Encounter: Payer: BC Managed Care – PPO | Admitting: Podiatry

## 2019-01-27 ENCOUNTER — Ambulatory Visit (INDEPENDENT_AMBULATORY_CARE_PROVIDER_SITE_OTHER): Payer: BC Managed Care – PPO | Admitting: Podiatry

## 2019-01-27 ENCOUNTER — Other Ambulatory Visit: Payer: Self-pay

## 2019-01-27 DIAGNOSIS — Z9889 Other specified postprocedural states: Secondary | ICD-10-CM

## 2019-01-27 DIAGNOSIS — M659 Synovitis and tenosynovitis, unspecified: Secondary | ICD-10-CM

## 2019-01-31 NOTE — Progress Notes (Signed)
   Subjective:  Patient presents today status post left ankle arthroscopy. DOS: 11/16/2018. She states she still has some pain in the left foot. She states the left ankle feels better. She reports the 5th toe still drops down. She has received injections which do no provide any significant relief. She has been taking Meloxicam as directed. Patient is here for further evaluation and treatment.    Past Medical History:  Diagnosis Date  . Anemia    H/O  . Anxiety   . BRCA negative 08/2018   MyRisk neg except NTHL1 VUS  . Complication of anesthesia   . Family history of breast cancer 08/2018   IBIS=8.65/riskscore=7.9%  . Family history of ovarian cancer    MyRisk neg except NTHL1 VUS  . GERD (gastroesophageal reflux disease)    NO MEDS  . Hyperlipidemia   . Hypertension   . Hypothyroidism    Hashimoto's per patient  . IFG (impaired fasting glucose) 08/05/2015  . Metabolic syndrome 2/55/2589  . Palpitations 2012   Evaluated by Ida Rogue, MD, Holter moniter  . PONV (postoperative nausea and vomiting)   . Rectal mass   . Thyroid disease    unspecified hypothyroidism      Objective/Physical Exam Neurovascular status intact.  Skin incisions appear to be well coapted. No sign of infectious process noted. No dehiscence. No active bleeding noted. Moderate edema noted to the surgical extremity. Pain with palpation noted to the anterior, lateral and medial aspects of the left ankle joint.   Assessment: 1. s/p left ankle arthroscopy. DOS: 11/16/2018 2. Ankle synovitis left - resolved    Plan of Care:  1. Patient was evaluated.  2. May resume full activity with no restrictions.  3. Recommended good shoe gear.  4. Continue taking Meloxicam as needed.  5. Return to clinic as needed.    Edrick Kins, DPM [facility]  Dr. Edrick Kins, [address]                                        [place], [place] [address]                Office [phone]   Fax [phone]

## 2019-02-27 ENCOUNTER — Other Ambulatory Visit: Payer: Self-pay | Admitting: Family Medicine

## 2019-02-28 ENCOUNTER — Ambulatory Visit: Payer: BC Managed Care – PPO | Admitting: Podiatry

## 2019-03-01 ENCOUNTER — Other Ambulatory Visit: Payer: Self-pay | Admitting: Family Medicine

## 2019-03-03 ENCOUNTER — Ambulatory Visit: Payer: BC Managed Care – PPO | Admitting: Podiatry

## 2019-03-06 ENCOUNTER — Other Ambulatory Visit: Payer: Self-pay | Admitting: Family Medicine

## 2019-03-12 ENCOUNTER — Ambulatory Visit
Admission: EM | Admit: 2019-03-12 | Discharge: 2019-03-12 | Disposition: A | Discharge status: Home / Self Care | Payer: BC Managed Care – PPO | Attending: Internal Medicine | Admitting: Internal Medicine

## 2019-03-12 ENCOUNTER — Ambulatory Visit (INDEPENDENT_AMBULATORY_CARE_PROVIDER_SITE_OTHER): Payer: BC Managed Care – PPO

## 2019-03-12 ENCOUNTER — Other Ambulatory Visit: Payer: Self-pay

## 2019-03-12 ENCOUNTER — Encounter: Payer: Self-pay | Admitting: Emergency Medicine

## 2019-03-12 DIAGNOSIS — R079 Chest pain, unspecified: Secondary | ICD-10-CM | POA: Insufficient documentation

## 2019-03-12 DIAGNOSIS — Z20828 Contact with and (suspected) exposure to other viral communicable diseases: Secondary | ICD-10-CM | POA: Diagnosis not present

## 2019-03-12 DIAGNOSIS — R05 Cough: Secondary | ICD-10-CM | POA: Diagnosis not present

## 2019-03-12 DIAGNOSIS — E039 Hypothyroidism, unspecified: Secondary | ICD-10-CM | POA: Insufficient documentation

## 2019-03-12 DIAGNOSIS — R0989 Other specified symptoms and signs involving the circulatory and respiratory systems: Secondary | ICD-10-CM

## 2019-03-12 DIAGNOSIS — R6889 Other general symptoms and signs: Secondary | ICD-10-CM | POA: Diagnosis not present

## 2019-03-12 DIAGNOSIS — Z79899 Other long term (current) drug therapy: Secondary | ICD-10-CM | POA: Insufficient documentation

## 2019-03-12 DIAGNOSIS — E785 Hyperlipidemia, unspecified: Secondary | ICD-10-CM | POA: Diagnosis not present

## 2019-03-12 DIAGNOSIS — R519 Headache, unspecified: Secondary | ICD-10-CM | POA: Insufficient documentation

## 2019-03-12 MED ORDER — ONDANSETRON 4 MG PO TBDP: 4.0000 mg | ORAL_TABLET | Freq: Three times a day (TID) | ORAL | 0 refills | Status: DC | PRN

## 2019-03-12 MED ORDER — BENZONATATE 100 MG PO CAPS: 100.0000 mg | ORAL_CAPSULE | Freq: Three times a day (TID) | ORAL | 0 refills | Status: DC

## 2019-03-12 NOTE — ED Provider Notes (Addendum)
[facility]    CSN: [id number] Arrival date & time: 03/12/19  1340      History   Chief Complaint Chief Complaint  Patient presents with  . Headache  . Cough    HPI Desiree Walker is a 58 y.o. female comes to urgent care with complaints of cough, chest congestion and headaches that started 2 days ago.  Patient denies any fever, chills, nausea or vomiting.  No loss of taste or smell.  No sick contacts or exposures.   No nausea, vomiting or diarrhea.  Cough is not productive and is associated with some chest tightness.  No actual chest pain.  No wheezing.  HPI  Past Medical History:  Diagnosis Date  . Anemia    H/O  . Anxiety   . BRCA negative 08/2018   MyRisk neg except NTHL1 VUS  . Complication of anesthesia   . Family history of breast cancer 08/2018   IBIS=8.65/riskscore=7.9%  . Family history of ovarian cancer    MyRisk neg except NTHL1 VUS  . GERD (gastroesophageal reflux disease)    NO MEDS  . Hyperlipidemia   . Hypertension   . Hypothyroidism    Hashimoto's per patient  . IFG (impaired fasting glucose) 08/05/2015  . Metabolic syndrome 10/08/3662  . Palpitations 2012   Evaluated by Ida Rogue, MD, Holter moniter  . PONV (postoperative nausea and vomiting)   . Rectal mass   . Thyroid disease    unspecified hypothyroidism    Patient Active Problem List   Diagnosis Date Noted  . S/P breast biopsy, right 01/12/2019  . Breast pain, right 01/12/2019  . Family history of breast cancer 06/02/2018  . Family history of ovarian cancer 06/02/2018  . Eczema 06/10/2017  . Preventative health care 01/15/2017  . History of uterine fibroid 10/23/2016  . Metabolic syndrome 40/34/7425  . Medication monitoring encounter 10/13/2016  . Pressure in chest 12/17/2015  . Anxiety disorder 12/17/2015  . Obesity 12/17/2015  . Sleep apnea 08/29/2015  . Snoring 08/27/2015  . Vitamin D deficiency 08/19/2015  . Leg cramps 08/05/2015  . IFG (impaired fasting  glucose) 08/05/2015  . Anal fistula   . Other specified symptoms and signs involving the digestive system and abdomen 10/31/2014  . 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 Bladder neoplasm of uncertain malignant potential 06/09/2013  . Female genuine stress incontinence 06/05/2013  . Incomplete bladder emptying 06/05/2013  . Urge incontinence 06/05/2013  . Dysuria 06/05/2013  . Other chronic cystitis without hematuria 06/05/2013  . Retention of urine 06/05/2013  . Chronic RLQ pain 07/27/2012  . Palpitations 09/11/2010  . Hypothyroidism, adult 05/10/2009  . Hyperlipidemia LDL goal <130 05/10/2009  . Hypertension goal BP (blood pressure) < 140/90 05/10/2009    Past Surgical History:  Procedure Laterality Date  . ANAL FISTULOTOMY N/A 11/30/2014   Procedure: ANAL FISTULOTOMY;  Surgeon: Dia Crawford III, MD;  Location: [facility] ORS;  Service: General;  Laterality: N/A;  . BREAST BIOPSY Right 09/08/2018   affirm bx of mass, coil clip, path pending  . CESAREAN SECTION    . COLONOSCOPY  2014  . DILATION AND CURETTAGE OF UTERUS    . ENDOMETRIAL ABLATION W/ NOVASURE  05/29/2009   Dr Kenton Kingfisher  . left foot surgery    . RECTAL EXAM UNDER ANESTHESIA N/A 11/30/2014   Procedure: RECTAL EXAM UNDER ANESTHESIA;  Surgeon: Dia Crawford III, MD;  Location: [facility] ORS;  Service: General;  Laterality: N/A;    OB History  Gravida  4   Para  3   Term  3   Preterm      AB  1   Living  3     SAB  1   TAB      Ectopic      Multiple      Live Births  3        Obstetric Comments  1st Menstrual Cycle:  11 1st Pregnancy:  20         Home Medications    Prior to Admission medications   Medication Sig Start Date End Date Taking? Authorizing Provider  levothyroxine (SYNTHROID) 112 MCG tablet TAKE ONE TABLET DAILY. EVERY SUNDAY TAKE AN EXTRA ONE-HALF TABLET. 01/18/19  Yes Hubbard Hartshorn, FNP  losartan (COZAAR) 50 MG tablet Take 1.5 tablets (75 mg total) by mouth daily. 01/20/19   Yes Hubbard Hartshorn, FNP  benzonatate (TESSALON) 100 MG capsule Take 1 capsule (100 mg total) by mouth every 8 (eight) hours. 03/12/19   LampteyMyrene Galas, MD  desonide (DESOWEN) 0.05 % lotion Apply topically 2 (two) times daily. Use on face; avoid eyes.  Use for max 2-3 weeks at a time. 01/06/19   Hubbard Hartshorn, FNP  DUPIXENT 300 MG/2ML prefilled syringe Inject 300 mg into the skin every 14 (fourteen) days.  10/23/18   [provider]  EUCRISA 2 % OINT Apply 1 application topically daily as needed. 01/06/19   Hubbard Hartshorn, FNP  ondansetron (ZOFRAN ODT) 4 MG disintegrating tablet Take 1 tablet (4 mg total) by mouth every 8 (eight) hours as needed for nausea or vomiting. 03/12/19   Kess Mcilwain, Myrene Galas, MD  triamcinolone (KENALOG) 0.025 % ointment Apply 1 application topically 2 (two) times daily as needed. For your hands - use for maximum 2 weeks; not for face, armpits, breasts, or genitals. 01/06/19   Hubbard Hartshorn, FNP    Family History Family History  Problem Relation Age of Onset  . Hypertension Mother   . Heart disease Mother 96       "blockages"  . Alzheimer's disease Mother   . Diverticulitis Father   . Hernia Father   . Depression Father   . Alzheimer's disease Father   . Hypothyroidism Sister   . Uterine cancer Sister 37  . Ovarian cancer Sister   . Breast cancer Maternal Aunt 55  . Hypothyroidism Daughter   . Hypertension Brother   . Stroke Maternal Grandfather   . Heart attack Maternal Grandfather   . Stroke Paternal Grandmother   . Hypothyroidism Sister   . Ovarian cancer Sister   . Hypothyroidism Daughter   . Breast cancer Paternal Aunt 74  . Breast cancer Other 30    Social History Social History   Tobacco Use  . Smoking status: Never Smoker  . Smokeless tobacco: Never Used  Substance Use Topics  . Alcohol use: No    Alcohol/week: 0.0 standard drinks  . Drug use: No     Allergies   Scopolamine   Review of Systems Review of Systems  HENT:  Positive for congestion. Negative for rhinorrhea and sore throat.   Respiratory: Positive for cough. Negative for chest tightness and shortness of breath.   Musculoskeletal: Negative for arthralgias and myalgias.  Skin: Negative for rash and wound.  Neurological: Positive for headaches. Negative for dizziness and light-headedness.  Psychiatric/Behavioral: Negative for confusion and decreased concentration.     Physical Exam Triage Vital Signs ED Triage Vitals  Enc Vitals  Group     BP 03/12/19 1435 (!) 156/75     Pulse Rate 03/12/19 1435 97     Resp 03/12/19 1435 16     Temp 03/12/19 1435 98.7 F (37.1 C)     Temp Source 03/12/19 1435 Oral     SpO2 03/12/19 1435 100 %     Weight 03/12/19 1431 214 lb (97.1 kg)     Height 03/12/19 1431 5' 7"  (1.702 m)     Head Circumference --      Peak Flow --      Pain Score 03/12/19 1431 0     Pain Loc --      Pain Edu? --      Excl. in [place]? --    No data found.  Updated Vital Signs BP (!) 156/75 (BP Location: Right Arm)   Pulse 97   Temp 98.7 F (37.1 C) (Oral)   Resp 16   Ht 5' 7"  (1.702 m)   Wt 97.1 kg   SpO2 100%   BMI 33.52 kg/m   Visual Acuity Right Eye Distance:   Left Eye Distance:   Bilateral Distance:    Right Eye Near:   Left Eye Near:    Bilateral Near:     Physical Exam Constitutional:      General: She is not in acute distress.    Appearance: She is not ill-appearing.  Eyes:     Extraocular Movements: Extraocular movements intact.     Pupils: Pupils are equal, round, and reactive to light.  Cardiovascular:     Rate and Rhythm: Normal rate and regular rhythm.     Heart sounds: Normal heart sounds. No murmur. No friction rub.  Pulmonary:     Effort: Pulmonary effort is normal.     Breath sounds: Normal breath sounds.  Abdominal:     General: Bowel sounds are normal. There is no distension.     Palpations: Abdomen is soft. There is no mass.  Neurological:     Mental Status: She is alert.     GCS: GCS eye  subscore is 4. GCS verbal subscore is 5. GCS motor subscore is 6.      UC Treatments / Results  Labs (all labs ordered are listed, but only abnormal results are displayed) Labs Reviewed  NOVEL CORONAVIRUS, NAA (HOSP ORDER, SEND-OUT TO REF LAB; TAT 18-24 HRS)    EKG   Radiology DG Chest 2 View  Result Date: 03/12/2019 CLINICAL DATA:  58 year old female with cough and chest pressure. EXAM: CHEST - 2 VIEW COMPARISON:  Chest radiograph dated 10/17/2013. FINDINGS: The heart size and mediastinal contours are within normal limits. Both lungs are clear. The visualized skeletal structures are unremarkable. IMPRESSION: No active cardiopulmonary disease. Electronically Signed   By: Anner Crete M.D.   On: 03/12/2019 15:11    Procedures Procedures (including critical care time)  Medications Ordered in UC Medications - No data to display  Initial Impression / Assessment and Plan / UC Course  I have reviewed the triage vital signs and the nursing notes.  Pertinent labs & imaging results that were available during my care of the patient were reviewed by me and considered in my medical decision making (see chart for details).     1.  Flulike symptoms with chest tightness: EKG shows normal sinus rhythm COVID-19 testing has been sent Tessalon Perles as needed for cough Zofran as needed for nausea vomiting If patient symptoms worsen she is advised to return to urgent  care to be reevaluated. Patient's heart score is low given the patient's atypical history for coronary artery disease and normal EKG. Final Clinical Impressions(s) / UC Diagnoses   Final diagnoses:  Flu-like symptoms   Discharge Instructions   None    ED Prescriptions    Medication Sig Dispense Auth. Provider   ondansetron (ZOFRAN ODT) 4 MG disintegrating tablet Take 1 tablet (4 mg total) by mouth every 8 (eight) hours as needed for nausea or vomiting. 20 tablet Comfort Iversen, Myrene Galas, MD   benzonatate (TESSALON) 100 MG  capsule Take 1 capsule (100 mg total) by mouth every 8 (eight) hours. 30 capsule Lekita Kerekes, Myrene Galas, MD     PDMP not reviewed this encounter.   Chase Picket, MD 03/13/19 1611    Chase Picket, MD 03/13/19 2130    Chase Picket, MD 03/13/19 [phone]

## 2019-03-12 NOTE — ED Triage Notes (Signed)
Patient c/o cough, chest congestion, and HAs that started 2 days ago.  Patient denies fevers.

## 2019-03-14 ENCOUNTER — Telehealth: Payer: Self-pay | Admitting: Family Medicine

## 2019-03-14 DIAGNOSIS — U071 COVID-19: Secondary | ICD-10-CM | POA: Insufficient documentation

## 2019-03-14 LAB — NOVEL CORONAVIRUS, NAA (HOSP ORDER, SEND-OUT TO REF LAB; TAT 18-24 HRS): SARS-CoV-2, NAA: NOT DETECTED

## 2019-03-14 NOTE — Telephone Encounter (Signed)
Patient requesting call from Raelyn Ensign, FNP to discuss bamlanivimab, she states she has read about it. Patient has recently tested positive for covid-19, results came in today.

## 2019-03-14 NOTE — Telephone Encounter (Signed)
Please see if she will do a virtual appointment tomorrow with Raquel Sarna

## 2019-03-15 ENCOUNTER — Other Ambulatory Visit: Payer: Self-pay

## 2019-03-15 ENCOUNTER — Ambulatory Visit (INDEPENDENT_AMBULATORY_CARE_PROVIDER_SITE_OTHER): Payer: BC Managed Care – PPO | Admitting: Family Medicine

## 2019-03-15 ENCOUNTER — Encounter: Payer: Self-pay | Admitting: Family Medicine

## 2019-03-15 NOTE — Progress Notes (Signed)
Called patient to do virtual visit - she reports is going to the ER because she is short of breath, EMS is on their way to her home at this time.

## 2019-03-16 ENCOUNTER — Ambulatory Visit: Payer: BC Managed Care – PPO | Admitting: Obstetrics & Gynecology

## 2019-03-21 ENCOUNTER — Telehealth: Payer: Self-pay | Admitting: Family Medicine

## 2019-03-21 NOTE — Telephone Encounter (Signed)
Pt called to inquire about her appt today, which is actually not until Wed. Pt was upset, because she wants to ask Raquel Sarna if she thinks the new infusion Bamlanivimab Is a good idea, and if she thinks she should do it?  Pt has appt at  [facility] today.  She is going to [place] to have it done, so needs to at  [facility]. Pt would like a call back before then.

## 2019-03-21 NOTE — Telephone Encounter (Signed)
Please advise 

## 2019-03-21 NOTE — Telephone Encounter (Signed)
Please advise that many patients have found this to be helpful in reducing the severity and longevity of their symptomsm.  Being that she had significant symptoms that led her to see emergency care last week, I would recommend she obtain the infusion if she qualifies for it.

## 2019-03-21 NOTE — Telephone Encounter (Signed)
Patient notified

## 2019-03-22 ENCOUNTER — Other Ambulatory Visit: Payer: Self-pay

## 2019-03-22 ENCOUNTER — Ambulatory Visit (INDEPENDENT_AMBULATORY_CARE_PROVIDER_SITE_OTHER): Payer: BC Managed Care – PPO | Admitting: Family Medicine

## 2019-03-22 ENCOUNTER — Encounter: Payer: Self-pay | Admitting: Family Medicine

## 2019-03-22 DIAGNOSIS — U071 COVID-19: Secondary | ICD-10-CM | POA: Diagnosis not present

## 2019-03-22 DIAGNOSIS — L308 Other specified dermatitis: Secondary | ICD-10-CM

## 2019-03-22 MED ORDER — ALBUTEROL SULFATE HFA 108 (90 BASE) MCG/ACT IN AERS: 2.0000 | INHALATION_SPRAY | Freq: Four times a day (QID) | RESPIRATORY_TRACT | 2 refills | Status: DC | PRN

## 2019-03-22 MED ORDER — TRIAMCINOLONE ACETONIDE 0.025 % EX OINT: 1.0000 "application " | TOPICAL_OINTMENT | Freq: Two times a day (BID) | CUTANEOUS | 1 refills | Status: DC | PRN

## 2019-03-22 NOTE — Progress Notes (Signed)
Name: Desiree Walker   MRN: [id number]    DOB: 1960-08-15   Date:03/22/2019       Progress Note  Subjective  Chief Complaint  Chief Complaint  Patient presents with  . Covid 19 positive    I connected with  Desiree Walker  on 03/22/19 at 10:20 AM EST by a video enabled telemedicine application and verified that I am speaking with the correct person using two identifiers.  I discussed the limitations of evaluation and management by telemedicine and the availability of in person appointments. The patient expressed understanding and agreed to proceed. Staff also discussed with the patient that there may be a patient responsible charge related to this service. Patient Location: Home Provider Location: Home Office Additional Individuals present: None  HPI  Pt presents to follow up on COVID-19 infection.  She tested positive 03/12/2019, symptoms started 03/11/2019.  She went to ER via EMS 03/15/2019 to the ER - she notes that she refused to do ambulating O2 saturation and was told she did not need admission.  She was given incentive spirometer in the ER which she has been using regularly.  She is having some shortness of breath intermittently - 2 episodes in the mornings, but none in the last 24 hours.  Headache has been improved in the last 24 hours.  She is still having significant fatigue and body aches. - She has been taking tylenol for fever and body aches, zofran for nausea, mucinex PRN drinking plenty of water.  - She was given hydroxyzine PRN while in the ER, but has not taken this yet.  She know to take PRN.  - She has FMLA set up with her work to return 03/26/2019.  We discucssed the common severe fatigue and possibility of prolonged symptoms.  If not improving by 03/26/19, we will extend her FMLA by 2 additional weeks.   Eczmema of hands: Red and itchy, "looks like poison ivy", on 2 of her knuckles and right little finger.  Has hx eczema.   Patient Active Problem List   Diagnosis  Date Noted  . S/P breast biopsy, right 01/12/2019  . Breast pain, right 01/12/2019  . Family history of breast cancer 06/02/2018  . Family history of ovarian cancer 06/02/2018  . Eczema 06/10/2017  . Preventative health care 01/15/2017  . History of uterine fibroid 10/23/2016  . Metabolic syndrome [id number]  . Medication monitoring encounter 10/13/2016  . Pressure in chest 12/17/2015  . Anxiety disorder 12/17/2015  . Obesity 12/17/2015  . Sleep apnea 08/29/2015  . Snoring 08/27/2015  . Vitamin D deficiency 08/19/2015  . Leg cramps 08/05/2015  . IFG (impaired fasting glucose) 08/05/2015  . Anal fistula   . Other specified symptoms and signs involving the digestive system and abdomen 10/31/2014  . 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 Bladder neoplasm of uncertain malignant potential 06/09/2013  . Female genuine stress incontinence 06/05/2013  . Incomplete bladder emptying 06/05/2013  . Urge incontinence 06/05/2013  . Dysuria 06/05/2013  . Other chronic cystitis without hematuria 06/05/2013  . Retention of urine 06/05/2013  . Chronic RLQ pain 07/27/2012  . Palpitations 09/11/2010  . Hypothyroidism, adult 05/10/2009  . Hyperlipidemia LDL goal <130 05/10/2009  . Hypertension goal BP (blood pressure) < 140/90 05/10/2009    Past Surgical History:  Procedure Laterality Date  . ANAL FISTULOTOMY N/A 11/30/2014   Procedure: ANAL FISTULOTOMY;  Surgeon: Dia Crawford III, MD;  Location: [facility] ORS;  Service: General;  Laterality: N/A;  .  BREAST BIOPSY Right 09/08/2018   affirm bx of mass, coil clip, path pending  . CESAREAN SECTION    . COLONOSCOPY  2014  . DILATION AND CURETTAGE OF UTERUS    . ENDOMETRIAL ABLATION W/ NOVASURE  05/29/2009   Dr Kenton Kingfisher  . left foot surgery    . RECTAL EXAM UNDER ANESTHESIA N/A 11/30/2014   Procedure: RECTAL EXAM UNDER ANESTHESIA;  Surgeon: Dia Crawford III, MD;  Location: [facility] ORS;  Service: General;  Laterality: N/A;    Family History    Problem Relation Age of Onset  . Hypertension Mother   . Heart disease Mother 83       "blockages"  . Alzheimer's disease Mother   . Diverticulitis Father   . Hernia Father   . Depression Father   . Alzheimer's disease Father   . Hypothyroidism Sister   . Uterine cancer Sister 39  . Ovarian cancer Sister   . Breast cancer Maternal Aunt 45  . Hypothyroidism Daughter   . Hypertension Brother   . Stroke Maternal Grandfather   . Heart attack Maternal Grandfather   . Stroke Paternal Grandmother   . Hypothyroidism Sister   . Ovarian cancer Sister   . Hypothyroidism Daughter   . Breast cancer Paternal Aunt 51  . Breast cancer Other 30    Social History   Socioeconomic History  . Marital status: Married    Spouse name: Herbie Baltimore A. Lien  . Number of children: 3  . Years of education: Not on file  . Highest education level: Associate degree: occupational, Hotel manager, or vocational program  Occupational History  . 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 Smoking status: Never Smoker  . Smokeless tobacco: Never Used  Substance and Sexual Activity  . Alcohol use: No    Alcohol/week: 0.0 standard drinks  . Drug use: No  . Sexual activity: Yes    Birth control/protection: Post-menopausal  Other Topics Concern  . Not on file  Social History Narrative  . Not on file   Social Determinants of Health   Financial Resource Strain:   . Difficulty of Paying Living Expenses: Not on file  Food Insecurity:   . Worried About Charity fundraiser in the Last Year: Not on file  . Ran Out of Food in the Last Year: Not on file  Transportation Needs:   . Lack of Transportation (Medical): Not on file  . Lack of Transportation (Non-Medical): Not on file  Physical Activity:   . Days of Exercise per Week: Not on file  . Minutes of Exercise per Session: Not on file  Stress:   . Feeling of Stress : Not on file  Social Connections:   . Frequency of Communication with Friends  and Family: Not on file  . Frequency of Social Gatherings with Friends and Family: Not on file  . Attends Religious Services: Not on file  . Active Member of Clubs or Organizations: Not on file  . Attends Archivist Meetings: Not on file  . Marital Status: Not on file  Intimate Partner Violence:   . Fear of Current or Ex-Partner: Not on file  . Emotionally Abused: Not on file  . Physically Abused: Not on file  . Sexually Abused: Not on file     Current Outpatient Medications:  .  benzonatate (TESSALON) 100 MG capsule, Take 1 capsule (100 mg total) by mouth every 8 (eight) hours., Disp: 30 capsule, Rfl: 0 .  DUPIXENT 300 MG/2ML prefilled syringe,  Inject 300 mg into the skin every 14 (fourteen) days. , Disp: , Rfl:  .  levothyroxine (SYNTHROID) 112 MCG tablet, TAKE ONE TABLET DAILY. EVERY SUNDAY TAKE AN EXTRA ONE-HALF TABLET., Disp: 90 tablet, Rfl: 3 .  losartan (COZAAR) 50 MG tablet, Take 1.5 tablets (75 mg total) by mouth daily., Disp: 135 tablet, Rfl: 1 .  ondansetron (ZOFRAN ODT) 4 MG disintegrating tablet, Take 1 tablet (4 mg total) by mouth every 8 (eight) hours as needed for nausea or vomiting., Disp: 20 tablet, Rfl: 0 .  desonide (DESOWEN) 0.05 % lotion, Apply topically 2 (two) times daily. Use on face; avoid eyes.  Use for max 2-3 weeks at a time. (Patient not taking: Reported on 03/15/2019), Disp: 59 mL, Rfl: 1 .  EUCRISA 2 % OINT, Apply 1 application topically daily as needed. (Patient not taking: Reported on 03/15/2019), Disp: 60 g, Rfl: 1 .  triamcinolone (KENALOG) 0.025 % ointment, Apply 1 application topically 2 (two) times daily as needed. For your hands - use for maximum 2 weeks; not for face, armpits, breasts, or genitals. (Patient not taking: Reported on 03/15/2019), Disp: 30 g, Rfl: 1  Allergies  Allergen Reactions  . Scopolamine Other (See Comments)    "[place]"    I personally reviewed active problem list, medication list, allergies,  notes from last 1 encounters, lab results, imaging with the patient/caregiver today.   ROS  Ten systems reviewed and is negative except as mentioned in HPI  Objective  Virtual encounter, vitals not obtained.  There is no height or weight on file to calculate BMI.  Physical Exam Constitutional: Patient appears well-developed and well-nourished. No distress.  HENT: Head: Normocephalic and atraumatic.  Neck: Normal range of motion. Pulmonary/Chest: Effort normal. No respiratory distress. Speaking in multiple complete sentences.  Intermittent cough is occasionally present. Neurological: Pt is alert and oriented to person, place, and time. Coordination, speech and gait are normal.  Psychiatric: Patient has a slightly anxious mood and affect. behavior is appropriate. Judgment and thought content normal.  No results found for this or any previous visit (from the past 72 hour(s)).  PHQ2/9: Depression screen [facility] 2/9 03/22/2019 03/15/2019 01/06/2019 10/25/2018 08/19/2018  Decreased Interest 0 0 0 0 0  Down, Depressed, Hopeless 0 0 0 0 0  PHQ - 2 Score 0 0 0 0 0  Altered sleeping 0 0 0 0 0  Tired, decreased energy 0 0 0 0 0  Change in appetite 0 0 1 0 0  Feeling bad or failure about yourself  0 0 0 0 0  Trouble concentrating 0 0 0 0 0  Moving slowly or fidgety/restless 0 0 0 0 0  Suicidal thoughts 0 0 0 0 0  PHQ-9 Score 0 0 1 0 0  Difficult doing work/chores Not difficult at all Not difficult at all Not difficult at all - Not difficult at all  Some recent data might be hidden   PHQ-2/9 Result is negative.    Fall Risk: Fall Risk  03/22/2019 03/15/2019 01/12/2019 01/12/2019 01/06/2019  Falls in the past year? 0 0 0 0 0  Number falls in past yr: 0 0 0 0 0  Injury with Fall? 0 0 0 0 0  Follow up Falls evaluation completed Falls evaluation completed - - Falls evaluation completed    Assessment & Plan  1. COVID-19 virus infection - Reassurance provided, continue current medications, add  Albuterol PRN.  Monitor symptoms for worsening.  FMLA for 2 additional  weeks if not improving on 03/26/19 - albuterol (VENTOLIN HFA) 108 (90 Base) MCG/ACT inhaler; Inhale 2 puffs into the lungs every 6 (six) hours as needed for wheezing or shortness of breath.  Dispense: 18 g; Refill: 2  2. Other eczema - triamcinolone (KENALOG) 0.025 % ointment; Apply 1 application topically 2 (two) times daily as needed. For your hands - use for maximum 2 weeks; not for face, armpits, breasts, or genitals.  Dispense: 30 g; Refill: 1  I discussed the assessment and treatment plan with the patient. The patient was provided an opportunity to ask questions and all were answered. The patient agreed with the plan and demonstrated an understanding of the instructions.  The patient was advised to call back or seek an in-person evaluation if the symptoms worsen or if the condition fails to improve as anticipated.  I provided 21 minutes of non-face-to-face time during this encounter.

## 2019-03-23 ENCOUNTER — Ambulatory Visit: Payer: BC Managed Care – PPO | Admitting: General Surgery

## 2019-03-26 ENCOUNTER — Encounter: Payer: Self-pay | Admitting: Family Medicine

## 2019-03-27 ENCOUNTER — Encounter: Payer: Self-pay | Admitting: Family Medicine

## 2019-03-27 ENCOUNTER — Telehealth: Payer: Self-pay | Admitting: Family Medicine

## 2019-03-27 ENCOUNTER — Telehealth: Payer: Self-pay | Admitting: Emergency Medicine

## 2019-03-27 DIAGNOSIS — U071 COVID-19: Secondary | ICD-10-CM

## 2019-03-27 MED ORDER — PROMETHAZINE-DM 6.25-15 MG/5ML PO SYRP: 5.0000 mL | ORAL_SOLUTION | Freq: Four times a day (QID) | ORAL | 0 refills | Status: DC | PRN

## 2019-03-27 NOTE — Telephone Encounter (Signed)
Patient dropped off FMLA paperwork. 1/10 was benefit end date. Would like end date to be changes to 01/17 to return to work on 1/18. Has already spoke to [facility]. Please also send copy of test results.

## 2019-03-27 NOTE — Telephone Encounter (Signed)
Patient requesting call back from [facility] or [employer] to discuss when to return to work. Patient has already had virtual visit and is just requesting phone call.

## 2019-03-27 NOTE — Telephone Encounter (Signed)
Copied from [place] [phone]. Topic: General - Call Back - No Documentation >> Mar 24, 2019  2:40 PM Desiree Walker wrote: Pt is requesting to have something called in for cough, cough has not gone away. Pt states the pearls are not working  [phone]

## 2019-03-27 NOTE — Telephone Encounter (Signed)
Cough syrup sent in

## 2019-03-28 NOTE — Telephone Encounter (Signed)
FMLA paperwork to be completed.

## 2019-04-19 ENCOUNTER — Other Ambulatory Visit: Payer: Self-pay | Admitting: General Surgery

## 2019-04-19 DIAGNOSIS — N644 Mastodynia: Secondary | ICD-10-CM

## 2019-04-25 ENCOUNTER — Other Ambulatory Visit: Payer: Self-pay

## 2019-04-25 ENCOUNTER — Encounter: Payer: Self-pay | Admitting: Family Medicine

## 2019-04-25 ENCOUNTER — Ambulatory Visit (INDEPENDENT_AMBULATORY_CARE_PROVIDER_SITE_OTHER): Payer: BC Managed Care – PPO | Admitting: Family Medicine

## 2019-04-25 ENCOUNTER — Ambulatory Visit: Payer: BC Managed Care – PPO | Admitting: Internal Medicine

## 2019-04-25 DIAGNOSIS — R5383 Other fatigue: Secondary | ICD-10-CM

## 2019-04-25 DIAGNOSIS — I10 Essential (primary) hypertension: Secondary | ICD-10-CM | POA: Diagnosis not present

## 2019-04-25 DIAGNOSIS — E785 Hyperlipidemia, unspecified: Secondary | ICD-10-CM | POA: Diagnosis not present

## 2019-04-25 DIAGNOSIS — Z8616 Personal history of COVID-19: Secondary | ICD-10-CM

## 2019-04-25 DIAGNOSIS — R21 Rash and other nonspecific skin eruption: Secondary | ICD-10-CM

## 2019-04-25 DIAGNOSIS — E8881 Metabolic syndrome: Secondary | ICD-10-CM

## 2019-04-25 DIAGNOSIS — E039 Hypothyroidism, unspecified: Secondary | ICD-10-CM | POA: Diagnosis not present

## 2019-04-25 MED ORDER — HYDROXYZINE HCL 25 MG PO TABS: 25.0000 mg | ORAL_TABLET | Freq: Three times a day (TID) | ORAL | 1 refills | Status: DC | PRN

## 2019-04-25 NOTE — Progress Notes (Signed)
Name: Desiree Walker   MRN: [id number]    DOB: 10/17/1960   Date:04/25/2019       Progress Note  Subjective  Chief Complaint  Chief Complaint  Patient presents with  . Hypertension    and headache     I connected with  [name]  on 04/25/19 at  9:00 AM EST by a video enabled telemedicine application and verified that I am speaking with the correct person using two identifiers.  I discussed the limitations of evaluation and management by telemedicine and the availability of in person appointments. The patient expressed understanding and agreed to proceed. Staff also discussed with the patient that there may be a patient responsible charge related to this service. Patient Location: Home Provider Location: Home office Additional Individuals present: None  HPI  Pt presents with concern for headache and HTN.  She recently switched over to evening shift (3pm-11pm) full time. She also had COVID-19 infection recently, did not take her BP medication during that time because she did not feel well and had heard that BP's sometimes run low with COVID-19 infection.  She restarted her BP medication (Losartan 50mg ) about a week ago because she noticed she was starting to get headaches. Has not been able to check BP at home. Headaches have since resolved. Denies chest pain, shortness of breath, palpitations, confusion, weakness, vision changes, BLE edema.  She does endorse fatigue.  She has been tired lately, not eating a health/balanced diet, but she has a good appetite. Pertinent history: Hashimoto's thyroiditis, HLD, and HTN.   Persistent Rash: Present on chin, just in front of her ears, back, posterior bilateral calves, anterior thighs all have breakout currently.  She asked her dermatologist about going back on dupixent, but was told it may not help.  She has appointment with rheumatologist in [facility] March 22nd. We will perform CRP, Sed rate, and rheumatology panel to    Patient Active Problem  List   Diagnosis Date Noted  . S/P breast biopsy, right 01/12/2019  . Breast pain, right 01/12/2019  . Family history of breast cancer 06/02/2018  . Family history of ovarian cancer 06/02/2018  . Eczema 06/10/2017  . Preventative health care 01/15/2017  . History of uterine fibroid 10/23/2016  . Metabolic syndrome A999333  . Medication monitoring encounter 10/13/2016  . Pressure in chest 12/17/2015  . Anxiety disorder 12/17/2015  . Obesity 12/17/2015  . Sleep apnea 08/29/2015  . Snoring 08/27/2015  . Vitamin D deficiency 08/19/2015  . Leg cramps 08/05/2015  . IFG (impaired fasting glucose) 08/05/2015  . Anal fistula   . Other specified symptoms and signs involving the digestive system and abdomen 10/31/2014  . 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 Bladder neoplasm of uncertain malignant potential 06/09/2013  . Female genuine stress incontinence 06/05/2013  . Incomplete bladder emptying 06/05/2013  . Urge incontinence 06/05/2013  . Dysuria 06/05/2013  . Other chronic cystitis without hematuria 06/05/2013  . Retention of urine 06/05/2013  . Chronic RLQ pain 07/27/2012  . Palpitations 09/11/2010  . Hypothyroidism, adult 05/10/2009  . Hyperlipidemia LDL goal <130 05/10/2009  . Hypertension goal BP (blood pressure) < 140/90 05/10/2009    Past Surgical History:  Procedure Laterality Date  . ANAL FISTULOTOMY N/A 11/30/2014   Procedure: ANAL FISTULOTOMY;  Surgeon: Dia Crawford III, MD;  Location: [facility] ORS;  Service: General;  Laterality: N/A;  . BREAST BIOPSY Right 09/08/2018   affirm bx of mass, coil clip, path pending  . CESAREAN SECTION    .  COLONOSCOPY  2014  . DILATION AND CURETTAGE OF UTERUS    . ENDOMETRIAL ABLATION W/ NOVASURE  05/29/2009   Dr Kenton Kingfisher  . left foot surgery    . RECTAL EXAM UNDER ANESTHESIA N/A 11/30/2014   Procedure: RECTAL EXAM UNDER ANESTHESIA;  Surgeon: Dia Crawford III, MD;  Location: [facility] ORS;  Service: General;  Laterality: N/A;     Family History  Problem Relation Age of Onset  . Hypertension Mother   . Heart disease Mother 83       "blockages"  . Alzheimer's disease Mother   . Diverticulitis Father   . Hernia Father   . Depression Father   . Alzheimer's disease Father   . Hypothyroidism Sister   . Uterine cancer Sister 62  . Ovarian cancer Sister   . Breast cancer Maternal Aunt 32  . Hypothyroidism Daughter   . Hypertension Brother   . Stroke Maternal Grandfather   . Heart attack Maternal Grandfather   . Stroke Paternal Grandmother   . Hypothyroidism Sister   . Ovarian cancer Sister   . Hypothyroidism Daughter   . Breast cancer Paternal Aunt 57  . Breast cancer Other 30    Social History   Socioeconomic History  . Marital status: Married    Spouse name: Herbie Baltimore A. Oblinger  . Number of children: 3  . Years of education: Not on file  . Highest education level: Associate degree: occupational, Hotel manager, or vocational program  Occupational History  . 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 Smoking status: Never Smoker  . Smokeless tobacco: Never Used  Substance and Sexual Activity  . Alcohol use: No    Alcohol/week: 0.0 standard drinks  . Drug use: No  . Sexual activity: Yes    Birth control/protection: Post-menopausal  Other Topics Concern  . Not on file  Social History Narrative  . Not on file   Social Determinants of Health   Financial Resource Strain:   . Difficulty of Paying Living Expenses: Not on file  Food Insecurity:   . Worried About Charity fundraiser in the Last Year: Not on file  . Ran Out of Food in the Last Year: Not on file  Transportation Needs:   . Lack of Transportation (Medical): Not on file  . Lack of Transportation (Non-Medical): Not on file  Physical Activity:   . Days of Exercise per Week: Not on file  . Minutes of Exercise per Session: Not on file  Stress:   . Feeling of Stress : Not on file  Social Connections:   . Frequency of  Communication with Friends and Family: Not on file  . Frequency of Social Gatherings with Friends and Family: Not on file  . Attends Religious Services: Not on file  . Active Member of Clubs or Organizations: Not on file  . Attends Archivist Meetings: Not on file  . Marital Status: Not on file  Intimate Partner Violence:   . Fear of Current or Ex-Partner: Not on file  . Emotionally Abused: Not on file  . Physically Abused: Not on file  . Sexually Abused: Not on file     Current Outpatient Medications:  .  levothyroxine (SYNTHROID) 112 MCG tablet, TAKE ONE TABLET DAILY. EVERY SUNDAY TAKE AN EXTRA ONE-HALF TABLET., Disp: 90 tablet, Rfl: 3 .  losartan (COZAAR) 50 MG tablet, Take 1.5 tablets (75 mg total) by mouth daily., Disp: 135 tablet, Rfl: 1 .  albuterol (VENTOLIN HFA) 108 (90 Base) MCG/ACT inhaler,  Inhale 2 puffs into the lungs every 6 (six) hours as needed for wheezing or shortness of breath. (Patient not taking: Reported on 04/25/2019), Disp: 18 g, Rfl: 2 .  desonide (DESOWEN) 0.05 % lotion, Apply topically 2 (two) times daily. Use on face; avoid eyes.  Use for max 2-3 weeks at a time. (Patient not taking: Reported on 03/15/2019), Disp: 59 mL, Rfl: 1 .  DUPIXENT 300 MG/2ML prefilled syringe, Inject 300 mg into the skin every 14 (fourteen) days. , Disp: , Rfl:  .  EUCRISA 2 % OINT, Apply 1 application topically daily as needed. (Patient not taking: Reported on 03/15/2019), Disp: 60 g, Rfl: 1 .  ondansetron (ZOFRAN ODT) 4 MG disintegrating tablet, Take 1 tablet (4 mg total) by mouth every 8 (eight) hours as needed for nausea or vomiting. (Patient not taking: Reported on 04/25/2019), Disp: 20 tablet, Rfl: 0 .  promethazine-dextromethorphan (PROMETHAZINE-DM) 6.25-15 MG/5ML syrup, Take 5 mLs by mouth 4 (four) times daily as needed. (Patient not taking: Reported on 04/25/2019), Disp: 118 mL, Rfl: 0 .  triamcinolone (KENALOG) 0.025 % ointment, Apply 1 application topically 2 (two) times  daily as needed. For your hands - use for maximum 2 weeks; not for face, armpits, breasts, or genitals. (Patient not taking: Reported on 04/25/2019), Disp: 30 g, Rfl: 1  Allergies  Allergen Reactions  . Scopolamine Other (See Comments)    "[place]"    I personally reviewed active problem list, medication list, allergies, notes from last encounter, lab results with the patient/caregiver today.   ROS  Ten systems reviewed and is negative except as mentioned in HPI  Objective  Virtual encounter, vitals not obtained.  There is no height or weight on file to calculate BMI.  Physical Exam  Constitutional: Patient appears well-developed and well-nourished. No distress.  HENT: Head: Normocephalic and atraumatic.  Neck: Normal range of motion. Pulmonary/Chest: Effort normal. No respiratory distress. Speaking in complete sentences Neurological: Pt is alert and oriented to person, place, and time. Coordination, speech are normal.  Psychiatric: Patient has a normal mood and affect. behavior is normal. Judgment and thought content normal.   No results found for this or any previous visit (from the past 72 hour(s)).  PHQ2/9: Depression screen [facility] 2/9 04/25/2019 03/22/2019 03/15/2019 01/06/2019 10/25/2018  Decreased Interest 0 0 0 0 0  Down, Depressed, Hopeless 0 0 0 0 0  PHQ - 2 Score 0 0 0 0 0  Altered sleeping - 0 0 0 0  Tired, decreased energy 1 0 0 0 0  Change in appetite 0 0 0 1 0  Feeling bad or failure about yourself  0 0 0 0 0  Trouble concentrating 0 0 0 0 0  Moving slowly or fidgety/restless 0 0 0 0 0  Suicidal thoughts 0 0 0 0 0  PHQ-9 Score - 0 0 1 0  Difficult doing work/chores Not difficult at all Not difficult at all Not difficult at all Not difficult at all -  Some recent data might be hidden   PHQ-2/9 Result is negative.    Fall Risk: Fall Risk  04/25/2019 03/22/2019 03/15/2019 01/12/2019 01/12/2019  Falls in the past year? 0 0 0 0 0  Number falls in  past yr: 0 0 0 0 0  Injury with Fall? 0 0 0 0 0  Follow up Falls evaluation completed Falls evaluation completed Falls evaluation completed - -    Assessment & Plan  1. Hypothyroidism, adult - Hx Hashimoto's -  TSH  2. Hypertension goal BP (blood pressure) < 140/90 - Will come for labs + BP check this week - COMPLETE METABOLIC PANEL WITH GFR - CBC with Differential/Platelet - TSH  3. Hyperlipidemia LDL goal <130 - Lipid panel  4. Personal history of covid-19 - Discussed recent COVID-19 infection and that many patients do experience fatigue ongoing for several months afterwards.  We will check labs as ordered, however will also consider prolonged fatigue secondary to hx COVID-19 infection in differential.  5. Rash and nonspecific skin eruption - She has appt with rheumatologist for evaluation.  Also seeing dermatology - C-reactive protein - Sedimentation rate - ANA,IFA RA Diag Pnl w/rflx Tit/Patn - hydrOXYzine (ATARAX/VISTARIL) 25 MG tablet; Take 1 tablet (25 mg total) by mouth 3 (three) times daily as needed for anxiety or itching.  Dispense: 30 tablet; Refill: 1  6. Metabolic syndrome - COMPLETE METABOLIC PANEL WITH GFR - Lipid panel - Hemoglobin A1c  7. Fatigue, unspecified type - COMPLETE METABOLIC PANEL WITH GFR - CBC with Differential/Platelet - TSH - C-reactive protein - Sedimentation rate - ANA,IFA RA Diag Pnl w/rflx Tit/Patn   I discussed the assessment and treatment plan with the patient. The patient was provided an opportunity to ask questions and all were answered. The patient agreed with the plan and demonstrated an understanding of the instructions.  The patient was advised to call back or seek an in-person evaluation if the symptoms worsen or if the condition fails to improve as anticipated.  I provided 24 minutes of non-face-to-face time during this encounter.

## 2019-04-28 ENCOUNTER — Ambulatory Visit: Payer: BC Managed Care – PPO

## 2019-05-03 ENCOUNTER — Ambulatory Visit: Payer: BC Managed Care – PPO

## 2019-05-04 ENCOUNTER — Ambulatory Visit: Payer: BC Managed Care – PPO

## 2019-05-08 ENCOUNTER — Ambulatory Visit: Payer: BC Managed Care – PPO

## 2019-05-12 ENCOUNTER — Ambulatory Visit: Payer: BC Managed Care – PPO

## 2019-05-12 ENCOUNTER — Other Ambulatory Visit: Payer: Self-pay

## 2019-05-12 VITALS — BP 126/74 | HR 95

## 2019-05-12 DIAGNOSIS — I10 Essential (primary) hypertension: Secondary | ICD-10-CM

## 2019-05-12 NOTE — Progress Notes (Signed)
Patient is here for a blood pressure check. Patient denies chest pain, palpitations, shortness of breath or visual disturbances. Blood pressure today is 126/74 heart rate of 95.

## 2019-05-13 NOTE — Progress Notes (Signed)
Clarene Critchley, I reviewed your recent history and noted your last visit with Raquel Sarna in early February with the labs ordered reviewed. Raquel Sarna is currently out of the office).  The glucose remains slightly high as does the A1C (c/w prediabetes). The remainder of the complete metabolic panel was ok (with the protein just below the normal range and best to follow presently). The CBC was normal, as was the TSH (thyroid screen). The lipid panel remains abnormal with high cholesterol (221), high triglycerides (180) and high LDL cholesterol (lousy type) at 137. I do think a medication (a statin like lipitor or crestor) should be considered to help improve your lipid panel and lessen future risk of heart disease). I would be happy to prescribe if you are interested in this, and if you would prefer to wait and talk about it with Raquel Sarna when she returns, that is also a very reasonable option. Just let us know. I will add some brief recommendations to help with increased lipids, especially the LDL cholesterol at the end of this message. The CRP (C-reactive protein which is a non-specific marker of inflammation) and the sed rate ordered were both normal, and the other rheumatologic screens ordered are still pending at present. I hope the above is helpful. Dr. Roxan Hockey  Your cholesterol, triglycerides and LDL are above normal.  The LDL is the "lousy" or bad cholesterol. Over time and in combination with inflammation and other factors, this contributes to plaque which in turn may lead to stroke and/or heart attack down the road.  Sometimes high LDL is primarily genetic, and people might be eating all the right foods but still have high numbers.  Other times, there is room for improvement in one's diet and eating healthier can bring this number down and potentially reduce one's risk of heart attack and/or stroke.  The desired LDL level is below 100. If you have diabetes or a possible heart problem, the desired LDL is below  70.  Some strategies to focus on to help improve your cholesterol/LDL levels:  - Eat 20 to 30 grams of fiber every day.  - Eat Foods such as fruits and vegetables, whole grains, beans, peas, nuts, and seeds can help lower LDL. - Avoid Saturated fats - Dairy foods - such as butter, cream, ghee, regular-fat milk and cheese. Meat - such as fatty cuts of beef, pork and lamb, processed meats like salami, sausages and the skin on chicken. Lard., fatty snack foods, cakes, biscuits, pies and deep fried foods) - Avoid smoking

## 2019-05-15 LAB — ANA,IFA RA DIAG PNL W/RFLX TIT/PATN
Anti Nuclear Antibody (ANA): NEGATIVE
Cyclic Citrullin Peptide Ab: <16 UNITS
Rheumatoid fact SerPl-aCnc: <14 IU/mL (ref <14)

## 2019-05-15 LAB — COMPLETE METABOLIC PANEL WITH GFR
AG Ratio: 2 (calc) (ref 1.0–2.5)
ALT: 11 U/L (ref 6–29)
AST: 11 U/L (ref 10–35)
Albumin: 4 g/dL (ref 3.6–5.1)
Alkaline phosphatase (APISO): 74 U/L (ref 37–153)
BUN: 18 mg/dL (ref 7–25)
CO2: 25 mmol/L (ref 20–32)
Calcium: 9.1 mg/dL (ref 8.6–10.4)
Chloride: 107 mmol/L (ref 98–110)
Creat: 0.84 mg/dL (ref 0.50–0.99)
GFR, Est African American: 88 mL/min/{1.73_m2} (ref >=60)
GFR, Est Non African American: 76 mL/min/{1.73_m2} (ref >=60)
Globulin: 2 g/dL (calc) (ref 1.9–3.7)
Glucose, Bld: 114 mg/dL — ABNORMAL HIGH (ref 65–99)
Potassium: 4.2 mmol/L (ref 3.5–5.3)
Sodium: 139 mmol/L (ref 135–146)
Total Bilirubin: 0.3 mg/dL (ref 0.2–1.2)
Total Protein: 6 g/dL — ABNORMAL LOW (ref 6.1–8.1)

## 2019-05-15 LAB — TSH: TSH: 2.49 mIU/L (ref 0.40–4.50)

## 2019-05-15 LAB — CBC WITH DIFFERENTIAL/PLATELET
Absolute Monocytes: 332 cells/uL (ref 200–950)
Basophils Absolute: 29 cells/uL (ref 0–200)
Basophils Relative: 0.7 %
Eosinophils Absolute: 151 cells/uL (ref 15–500)
Eosinophils Relative: 3.6 %
HCT: 38 % (ref 35.0–45.0)
Hemoglobin: 12.7 g/dL (ref 11.7–15.5)
Lymphs Abs: 1739 cells/uL (ref 850–3900)
MCH: 27.1 pg (ref 27.0–33.0)
MCHC: 33.4 g/dL (ref 32.0–36.0)
MCV: 81 fL (ref 80.0–100.0)
MPV: 10 fL (ref 7.5–12.5)
Monocytes Relative: 7.9 %
Neutro Abs: 1949 cells/uL (ref 1500–7800)
Neutrophils Relative %: 46.4 %
Platelets: 204 10*3/uL (ref 140–400)
RBC: 4.69 10*6/uL (ref 3.80–5.10)
RDW: 13.7 % (ref 11.0–15.0)
Total Lymphocyte: 41.4 %
WBC: 4.2 10*3/uL (ref 3.8–10.8)

## 2019-05-15 LAB — LIPID PANEL
Cholesterol: 221 mg/dL — ABNORMAL HIGH (ref <200)
HDL: 52 mg/dL (ref >=50)
LDL Cholesterol (Calc): 137 mg/dL (calc) — ABNORMAL HIGH
Non-HDL Cholesterol (Calc): 169 mg/dL (calc) — ABNORMAL HIGH (ref <130)
Total CHOL/HDL Ratio: 4.3 (calc) (ref <5.0)
Triglycerides: 180 mg/dL — ABNORMAL HIGH (ref <150)

## 2019-05-15 LAB — HEMOGLOBIN A1C
Hgb A1c MFr Bld: 5.9 % of total Hgb — ABNORMAL HIGH (ref <5.7)
Mean Plasma Glucose: 123 (calc)
eAG (mmol/L): 6.8 (calc)

## 2019-05-15 LAB — SEDIMENTATION RATE: Sed Rate: 9 mm/h (ref 0–30)

## 2019-05-15 LAB — C-REACTIVE PROTEIN: CRP: 2.3 mg/L (ref <8.0)

## 2019-05-16 ENCOUNTER — Telehealth: Payer: Self-pay | Admitting: Family Medicine

## 2019-05-16 NOTE — Telephone Encounter (Signed)
Medication Refill - Medication: synthroid  Has the patient contacted their pharmacy? Yes.   (Agent: If no, request that the patient contact the pharmacy for the refill.) (Agent: If yes, when and what did the pharmacy advise?)  Preferred Pharmacy (with phone number or street name):  [id number] - [place], [place]  [place] [address]  Phone: [phone] Fax: [phone]  Not a 24 hour pharmacy; exact hours not known.     Agent: Please be advised that RX refills may take up to 3 business days. We ask that you follow-up with your pharmacy.

## 2019-05-19 NOTE — Telephone Encounter (Signed)
[facility] [place] patient has 3 refills (270) left of Synthroid on file. Cannot refill until 3/23. Patient was notified to go online and get a Synthroid coupon card and she can get medication $25 a month which will come out to $75 instead of the $90 she is paying.

## 2019-05-19 NOTE — Telephone Encounter (Signed)
Pt called back in to follow up on med refill request. Spoke with nurse that is working on request, advised to send request to the office instead.   Please assist pt with refill as she has been waiting for days to receive   Pharmacy:  [id number] - [place], [place] [address]  Phone: [phone] Fax: [phone]  Not a 24 hour pharmacy; exact hours not known.

## 2019-05-19 NOTE — Telephone Encounter (Signed)
Pt calling back, would like to know what is going on with this.  Pt states that she is out and is very frustrated that she can not get an answer.

## 2019-06-02 ENCOUNTER — Encounter: Payer: Self-pay | Admitting: Certified Nurse Midwife

## 2019-06-02 LAB — COLOGUARD

## 2019-06-06 ENCOUNTER — Telehealth: Payer: Self-pay

## 2019-06-06 NOTE — Telephone Encounter (Signed)
[employer] send notification that cologard order has expired.

## 2019-06-09 ENCOUNTER — Ambulatory Visit: Payer: BC Managed Care – PPO | Admitting: Podiatry

## 2019-06-19 ENCOUNTER — Ambulatory Visit
Admission: RE | Admit: 2019-06-19 | Discharge: 2019-06-19 | Disposition: A | Discharge status: Home / Self Care | Payer: BC Managed Care – PPO | Source: Ambulatory Visit | Attending: General Surgery | Admitting: General Surgery

## 2019-06-19 DIAGNOSIS — N644 Mastodynia: Secondary | ICD-10-CM | POA: Insufficient documentation

## 2019-06-22 ENCOUNTER — Ambulatory Visit: Payer: BC Managed Care – PPO | Admitting: Family Medicine

## 2019-06-23 ENCOUNTER — Ambulatory Visit: Payer: BC Managed Care – PPO | Admitting: Podiatry

## 2019-06-27 ENCOUNTER — Other Ambulatory Visit: Payer: Self-pay

## 2019-06-27 ENCOUNTER — Encounter: Payer: Self-pay | Admitting: Podiatry

## 2019-06-27 ENCOUNTER — Ambulatory Visit (INDEPENDENT_AMBULATORY_CARE_PROVIDER_SITE_OTHER): Payer: BC Managed Care – PPO

## 2019-06-27 ENCOUNTER — Ambulatory Visit: Payer: BC Managed Care – PPO | Admitting: Podiatry

## 2019-06-27 DIAGNOSIS — L84 Corns and callosities: Secondary | ICD-10-CM | POA: Diagnosis not present

## 2019-06-27 DIAGNOSIS — M2011 Hallux valgus (acquired), right foot: Secondary | ICD-10-CM | POA: Diagnosis not present

## 2019-06-27 DIAGNOSIS — L600 Ingrowing nail: Secondary | ICD-10-CM | POA: Diagnosis not present

## 2019-07-02 NOTE — Progress Notes (Signed)
   Subjective: Patient presents today for evaluation of intermittent pain to the lateral border of the left hallux that began 6 weeks ago. Patient is concerned for possible ingrown nail. Touching the toe increases the pain. She has trimmed the nail herself for treatment.  She also notes a callus lesion to the left fifth toe that appeared 3-4 months ago. She reports intermittent pain that is often worse at night. She denies modifying factors and has not had any treatment. Patient presents today for further treatment and evaluation.  Past Medical History:  Diagnosis Date  . Anemia    H/O  . Anxiety   . BRCA negative 08/2018   MyRisk neg except NTHL1 VUS  . Complication of anesthesia   . Family history of breast cancer 08/2018   IBIS=8.65/riskscore=7.9%  . Family history of ovarian cancer    MyRisk neg except NTHL1 VUS  . GERD (gastroesophageal reflux disease)    NO MEDS  . Hyperlipidemia   . Hypertension   . Hypothyroidism    Hashimoto's per patient  . IFG (impaired fasting glucose) 08/05/2015  . Metabolic syndrome 9/49/4473  . Palpitations 2012   Evaluated by Ida Rogue, MD, Holter moniter  . PONV (postoperative nausea and vomiting)   . Rectal mass   . Thyroid disease    unspecified hypothyroidism    Objective:  General: Well developed, nourished, in no acute distress, alert and oriented x3   Dermatology: Skin is warm, dry and supple bilateral. Lateral border of the left hallux appears to be erythematous with evidence of an ingrowing nail. Pain on palpation noted to the border of the nail fold. Corn noted to the left fifth toe. The remaining nails appear unremarkable at this time. There are no open sores, lesions.  Vascular: Dorsalis Pedis artery and Posterior Tibial artery pedal pulses palpable. No lower extremity edema noted.   Neruologic: Grossly intact via light touch bilateral.  Musculoskeletal: Muscular strength within normal limits in all groups bilateral. Normal  range of motion noted to all pedal and ankle joints.   Assesement: #1 Paronychia with ingrowing nail lateral border left hallux  #2 Pain in toe #3 Incurvated nail #4 Corn left fifth toe   Plan of Care:  1. Patient evaluated.  2. Excisional debridement of keratotic lesion(s) using a chisel blade was performed without incident. Light dressing applied.  3. Mechanical debridement of the left great toenail performed using a nail nipper. Filed with dremel without incident.  4. Recommended good shoe gear.  5. Return to clinic as needed.    Edrick Kins, DPM [facility]  Dr. Edrick Kins, [place]                                        [place], [place] [address]                Office [phone]  Fax [phone]

## 2019-07-05 ENCOUNTER — Ambulatory Visit: Payer: BC Managed Care – PPO | Admitting: Family Medicine

## 2019-07-14 ENCOUNTER — Ambulatory Visit: Payer: BC Managed Care – PPO | Admitting: Family Medicine

## 2019-07-14 ENCOUNTER — Other Ambulatory Visit: Payer: Self-pay

## 2019-07-14 ENCOUNTER — Encounter: Payer: Self-pay | Admitting: Family Medicine

## 2019-07-14 VITALS — BP 136/86 | HR 99 | Temp 98.1°F | Resp 14 | Ht 67.0 in | Wt 209.0 lb

## 2019-07-14 DIAGNOSIS — E669 Obesity, unspecified: Secondary | ICD-10-CM

## 2019-07-14 DIAGNOSIS — Z889 Allergy status to unspecified drugs, medicaments and biological substances status: Secondary | ICD-10-CM

## 2019-07-14 DIAGNOSIS — R21 Rash and other nonspecific skin eruption: Secondary | ICD-10-CM | POA: Diagnosis not present

## 2019-07-14 DIAGNOSIS — Z9189 Other specified personal risk factors, not elsewhere classified: Secondary | ICD-10-CM

## 2019-07-14 DIAGNOSIS — Z5181 Encounter for therapeutic drug level monitoring: Secondary | ICD-10-CM | POA: Diagnosis not present

## 2019-07-14 DIAGNOSIS — I10 Essential (primary) hypertension: Secondary | ICD-10-CM | POA: Diagnosis not present

## 2019-07-14 DIAGNOSIS — E785 Hyperlipidemia, unspecified: Secondary | ICD-10-CM

## 2019-07-14 DIAGNOSIS — Z6832 Body mass index (BMI) 32.0-32.9, adult: Secondary | ICD-10-CM

## 2019-07-14 DIAGNOSIS — E66811 Obesity, class 1: Secondary | ICD-10-CM

## 2019-07-14 MED ORDER — MONTELUKAST SODIUM 10 MG PO TABS: 10.0000 mg | ORAL_TABLET | Freq: Every day | ORAL | 3 refills | Status: DC

## 2019-07-14 NOTE — Progress Notes (Signed)
Name: Desiree Walker   MRN: [id number]    DOB: [birth year]   Date:07/14/2019       Progress Note  Chief Complaint  Patient presents with  . Follow-up  . Hypertension  . Hypothyroidism  . Hyperlipidemia     Subjective:   Desiree Walker is a 59 y.o. female, presents to clinic for routine follow up on the conditions listed above.  Pt new to me, PCP out of office She presents for f/up on routine conditions, she did just have routine f/up visit with PCP in Feb about 2 months ago, labs completed shortly afterwards.  Reviewed labs with pt today, specifically lipid panel, past recommendations, ASCVD risk and statin meds. Pt is anxious and concerned about high cholesterol, she reports eating very poorly for most of the last year during pandemic, she would like to start to work on some changes for her health and to improve HLD and to decrease risk of heart attacks and strokes since it runs in her family.  She would like to avoid medications if possible, and she has several questions about labs, risks, statin meds, diet/lifestyle changes, requests print out of prior recommendations from Dr. Lemmie Evens.   Hyperlipidemia: Current Medication Regimen:  nothing Last Lipids: Lab Results  Component Value Date   CHOL 221 (H) 05/12/2019   HDL 52 05/12/2019   LDLCALC 137 (H) 05/12/2019   TRIG 180 (H) 05/12/2019   CHOLHDL 4.3 05/12/2019  - Current Diet:  Poor, eating out a lot and eating a lot of junk food. - Denies: Chest pain, shortness of breath, myalgias. - Documented aortic atherosclerosis? No - Risk factors for atherosclerosis: hypercholesterolemia and hypertension The 10-year ASCVD risk score Mikey Bussing DC Jr., et al., 2013) is: 5.6%   Values used to calculate the score:     Age: 56 years     Sex: Female     Is Non-Hispanic African American: No     Diabetic: No     Tobacco smoker: No     Systolic Blood Pressure: [id number] mmHg     Is BP treated: Yes     HDL Cholesterol: 52 mg/dL     Total Cholesterol: 221  mg/dL    Hypertension:  Currently managed on losartan 75 mg daily Pt reports good med compliance and denies any SE.  No lightheadedness, hypotension, syncope. Blood pressure today is borderline controlled for age, would prefer goal of < 130/80 BP Readings from Last 3 Encounters:  07/14/19 136/86  05/12/19 126/74  03/12/19 (!) 156/75  Pt denies CP, SOB, exertional sx, LE edema, palpitation, Ha's, visual disturbances Dietary efforts for BP?  None  Hx of rash dx with dermatitis, and multiple allergies, tx by dermatology, she has rash to face, neck, chest hand and arms, just that I can see.  She has topical medicine which is not helping, she started zyrtec but hasn't noticed a different with rash or itching.  Raquel Sarna previously prescribed hydroxyzine which made her feel jittery, she tries to sparingly take benadryl to help her sleep and not scratch skin at night.  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  She has temovate but she reports its not helping much with rash, it continues to go in cycles of flares and then she treats then it gets a little better then it starts all over again  Patient Active Problem List   Diagnosis Date Noted  . S/P breast biopsy, right 01/12/2019  . Breast pain, right 01/12/2019  . Family history of breast cancer 06/02/2018  . Family history of ovarian cancer 06/02/2018  . Eczema 06/10/2017  . History of uterine fibroid 10/23/2016  . Metabolic syndrome [id number]  . Medication monitoring encounter 10/13/2016  . Pressure in chest 12/17/2015  . Anxiety disorder 12/17/2015  . Obesity 12/17/2015  . Sleep apnea 08/29/2015  . Snoring 08/27/2015  . Vitamin D deficiency 08/19/2015  . Leg cramps 08/05/2015  . IFG (impaired fasting glucose) 08/05/2015  . Anal fistula   . Other specified symptoms and signs involving the digestive  system and abdomen 10/31/2014  . 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 Bladder neoplasm of uncertain malignant potential 06/09/2013  . Female genuine stress incontinence 06/05/2013  . Incomplete bladder emptying 06/05/2013  . Urge incontinence 06/05/2013  . Dysuria 06/05/2013  . Other chronic cystitis without hematuria 06/05/2013  . Retention of urine 06/05/2013  . Chronic RLQ pain 07/27/2012  . Palpitations 09/11/2010  . Hypothyroidism, adult 05/10/2009  . Hyperlipidemia LDL goal <130 05/10/2009  . Hypertension goal BP (blood pressure) < 140/90 05/10/2009    Past Surgical History:  Procedure Laterality Date  . ANAL FISTULOTOMY N/A 11/30/2014   Procedure: ANAL FISTULOTOMY;  Surgeon: Dia Crawford III, MD;  Location: [facility] ORS;  Service: General;  Laterality: N/A;  . BREAST BIOPSY Right 09/08/2018   affirm bx of mass, coil clip, ORGANIZING FAT NECROSIS AND  . CESAREAN SECTION    . COLONOSCOPY  2014  . DILATION AND CURETTAGE OF UTERUS    . ENDOMETRIAL ABLATION W/ NOVASURE  05/29/2009   Dr Kenton Kingfisher  . left foot surgery    . RECTAL EXAM UNDER ANESTHESIA N/A 11/30/2014   Procedure: RECTAL EXAM UNDER ANESTHESIA;  Surgeon: Dia Crawford III, MD;  Location: [facility] ORS;  Service: General;  Laterality: N/A;    Family History  Problem Relation Age of Onset  . Hypertension Mother   . Heart disease Mother 18       "blockages"  . Alzheimer's disease Mother   . Diverticulitis Father   . Hernia Father   . Depression Father   . Alzheimer's disease Father   . Hypothyroidism Sister   . Uterine cancer Sister 32  . Ovarian cancer Sister   . Breast cancer Maternal Aunt 82  . Hypothyroidism Daughter   . Hypertension Brother   . Stroke Maternal Grandfather   . Heart attack Maternal Grandfather   . Stroke Paternal Grandmother   . Hypothyroidism Sister   . Ovarian cancer Sister   . Hypothyroidism Daughter   . Breast cancer Paternal Aunt 39  . Breast cancer Other 30    Social  History   Tobacco Use  . Smoking status: Never Smoker  . Smokeless tobacco: Never Used  Substance Use Topics  . Alcohol use: No    Alcohol/week: 0.0 standard drinks  . Drug use: No      Current Outpatient Medications:  .  levothyroxine (SYNTHROID) 112 MCG tablet, TAKE ONE TABLET DAILY. EVERY SUNDAY TAKE AN EXTRA ONE-HALF TABLET., Disp: 90 tablet, Rfl: 3 .  losartan (COZAAR) 50 MG tablet, Take 1.5 tablets (75 mg total) by mouth daily., Disp: 135 tablet, Rfl: 1 .  meloxicam (MOBIC) 15 MG tablet, Take 15 mg by mouth daily as needed. , Disp: , Rfl:  .  hydrOXYzine (ATARAX/VISTARIL) 25 MG tablet, Take 1 tablet (25 mg total) by mouth  3 (three) times daily as needed for anxiety or itching. (Patient not taking: Reported on 07/14/2019), Disp: 30 tablet, Rfl: 1  Allergies  Allergen Reactions  . Scopolamine Other (See Comments)    "Worth"    Chart Review Today: I personally reviewed active problem list, medication list, allergies, family history, social history, health maintenance, notes from last encounter, lab results, imaging with the patient/caregiver today.   Review of Systems  10 Systems reviewed and are negative for acute change except as noted in the HPI.  Objective:    Vitals:   07/14/19 1113  BP: 136/86  Pulse: 99  Resp: 14  Temp: 98.1 F (36.7 C)  SpO2: 94%  Weight: 209 lb (94.8 kg)  Height: 5\' 7"  (1.702 m)    Body mass index is 32.73 kg/m.  Physical Exam Vitals and nursing note reviewed.  Constitutional:      General: She is not in acute distress.    Appearance: Normal appearance. She is well-developed. She is obese. She is not ill-appearing, toxic-appearing or diaphoretic.  HENT:     Head: Normocephalic and atraumatic.     Nose: Nose normal.  Eyes:     General:        Right eye: No discharge.        Left eye: No discharge.     Conjunctiva/sclera: Conjunctivae normal.  Neck:     Trachea: No tracheal deviation.  Cardiovascular:      Rate and Rhythm: Normal rate and regular rhythm.     Pulses: Normal pulses.     Heart sounds: Normal heart sounds.  Pulmonary:     Effort: Pulmonary effort is normal. No respiratory distress.     Breath sounds: Normal breath sounds. No stridor.  Abdominal:     General: Bowel sounds are normal. There is no distension.     Palpations: Abdomen is soft.  Musculoskeletal:     Right lower leg: No edema.     Left lower leg: No edema.  Skin:    General: Skin is warm and dry.     Findings: Rash present.  Neurological:     Mental Status: She is alert.     Motor: No abnormal muscle tone.     Coordination: Coordination normal.  Psychiatric:        Mood and Affect: Mood normal.       PHQ2/9: Depression screen [facility] 2/9 07/14/2019 04/25/2019 03/22/2019 03/15/2019 01/06/2019  Decreased Interest 0 0 0 0 0  Down, Depressed, Hopeless 0 0 0 0 0  PHQ - 2 Score 0 0 0 0 0  Altered sleeping 0 - 0 0 0  Tired, decreased energy 0 1 0 0 0  Change in appetite 0 0 0 0 1  Feeling bad or failure about yourself  0 0 0 0 0  Trouble concentrating 0 0 0 0 0  Moving slowly or fidgety/restless 0 0 0 0 0  Suicidal thoughts 0 0 0 0 0  PHQ-9 Score 0 - 0 0 1  Difficult doing work/chores Not difficult at all Not difficult at all Not difficult at all Not difficult at all Not difficult at all  Some recent data might be hidden    phq 9 is neg, reviewed  Fall Risk: Fall Risk  07/14/2019 04/25/2019 03/22/2019 03/15/2019 01/12/2019  Falls in the past year? 0 0 0 0 0  Number falls in past yr: 0 0 0 0 0  Injury with Fall? 0 0 0 0  0  Follow up - Falls evaluation completed Falls evaluation completed Falls evaluation completed -    Functional Status Survey: Is the patient deaf or have difficulty hearing?: No Does the patient have difficulty seeing, even when wearing glasses/contacts?: No Does the patient have difficulty concentrating, remembering, or making decisions?: No Does the patient have difficulty walking or climbing  stairs?: No Does the patient have difficulty dressing or bathing?: No Does the patient have difficulty doing errands alone such as visiting a doctor's office or shopping?: No   Assessment & Plan:     ICD-10-CM   1. Essential hypertension  I10    fairly well controlled, would like to see her BP be below 130/80, pt is motivated to work on [employer] and lifestyle changes  2. Hyperlipidemia LDL goal <130  E78.5    extensive discussion today re HLD and ASCVD risk, modifyable factors and diet and lifestyle changes, handouts and diet instructions given, pt to f/up 6 months  3. Medication monitoring encounter  Z51.81   4. Rash and nonspecific skin eruption  R21 montelukast (SINGULAIR) 10 MG tablet   encouraged her to tx underlying allergies with 2nd gen antihistamines, pepcid, benadryl and keep skin hydrated and avoid irritants   5. Class 1 obesity with body mass index (BMI) of 32.0 to 32.9 in adult, unspecified obesity type, unspecified whether serious comorbidity present  E66.9    Z68.32    encouraged smaller attainable goals of adding in healthy foods, cutting out bad, increasing exercise for overall healthier lifestyle  6. H/O multiple allergies  Z91.89 montelukast (SINGULAIR) 10 MG tablet   Patient noncompliant with suggested treatment from specialist encouraged second-generation antihistamines additional H2 blockers, Singulair   Gust possible side effects and black box warning with Singulair, patient has been on it in the past.  She does seem slightly anxious or hyperactive today, we discussed mood anxiety or suicidal ideations patient was encouraged to try it since she has tested positive for multiple allergies and her rash is diffuse and fairly severe, but encouraged her to stop the medications if it causes any anxiousness or change in moods. She did not tolerate hydroxyzine very well so she will continue to use Benadryl as needed  Patient did ask me for a different topical medication  for her rash however she is on Temovate and it is highest potency topical steroid that she can use, can offer to her as oral steroid burst while she starts her maintenance medications for allergies and start hydrating skin aggressively we will see if she would like to do this but unlikely that any other topical medication would be more helpful than what she already has from the specialist.  She was encouraged to follow-up with a specialist regarding her rash and allergy questions  Significant amount of time today spent counseling pt on HLD, CVD, risk reduction, healthy diet lifestyle changes, I did give her multiple printed resources on her after visit summary and also handout for American [employer] Association regarding lifestyle changes for hyperlipidemia also gave her information regarding statin medications and how they work and possible side effects were reviewed with her today.  She currently does not want to start a statin medication and with her current ASCVD risk score she is likely has some time and ability to work on decreasing cholesterol levels and blood pressure levels through lifestyle changes and may not need a statin medication for several more years.  Greater than 50% of this visit was spent in direct face-to-face  counseling, obtaining history and physical, discussing and educating pt on treatment plan.  Total time of this visit was 30+ min.  Remainder of time involved but was not limited to reviewing chart (recent and pertinent OV notes and labs), documentation in EMR, and coordinating care and treatment plan.   Return for 6 month f/up with labs HLD/HTN.   Delsa Grana, PA-C 07/14/19 11:25 AM

## 2019-07-14 NOTE — Patient Instructions (Addendum)
Lab Results  Component Value Date   CHOL 221 (H) 05/12/2019   HDL 52 05/12/2019   LDLCALC 137 (H) 05/12/2019   TRIG 180 (H) 05/12/2019   CHOLHDL 4.3 05/12/2019    Below is your risk score over the next 10 years:  The 10-year ASCVD risk score Mikey Bussing DC Brooke Bonito., et al., 2013) is: 5.6%   Values used to calculate the score:     Age: 59 years     Sex: Female     Is Non-Hispanic African American: No     Diabetic: No     Tobacco smoker: No     Systolic Blood Pressure: [id number] mmHg     Is BP treated: Yes     HDL Cholesterol: 52 mg/dL     Total Cholesterol: 221 mg/dL  See the cholesterol hand out for easy things you can do with diet and lifestyle. Just keep working on adding in good and healthy into you day and life, and try and work on every month or couple of weeks cutting out one bad food or habit at a time.       Info below from Dr. Roxan Hockey:  Clarene Critchley, I reviewed your recent history and noted your last visit with Raquel Sarna in early February with the labs ordered reviewed. Raquel Sarna is currently out of the office).  The glucose remains slightly high as does the A1C (c/w prediabetes). The remainder of the complete metabolic panel was ok (with the protein just below the normal range and best to follow presently). The CBC was normal, as was the TSH (thyroid screen). The lipid panel remains abnormal with high cholesterol (221), high triglycerides (180) and high LDL cholesterol (lousy type) at 137. I do think a medication (a statin like lipitor or crestor) should be considered to help improve your lipid panel and lessen future risk of heart disease). I would be happy to prescribe if you are interested in this, and if you would prefer to wait and talk about it with Raquel Sarna when she returns, that is also a very reasonable option. Just let us know. I will add some brief recommendations to help with increased lipids, especially the LDL cholesterol at the end of this message. The CRP (C-reactive protein  which is a non-specific marker of inflammation) and the sed rate ordered were both normal, and the other rheumatologic screens ordered are still pending at present. I hope the above is helpful.  Dr. Roxan Hockey  Your cholesterol, triglycerides and LDL are above normal. The LDL is the "lousy" or bad cholesterol. Over time and in combination with inflammation and other factors, this contributes to plaque which in turn may lead to stroke and/or heart attack down the road. Sometimes high LDL is primarily genetic, and people might be eating all the right foods but still have high numbers. Other times, there is room for improvement in one's diet and eating healthier can bring this number down and potentially reduce one's risk of heart attack and/or stroke. The desired LDL level is below 100. If you have diabetes or a possible heart problem, the desired LDL is below 70.  Some strategies to focus on to help improve your cholesterol/LDL levels:  - Eat 20 to 30 grams of fiber every day.  - Eat Foods such as fruits and vegetables, whole grains, beans, peas, nuts, and seeds can help lower LDL. - Avoid Saturated fats - Dairy foods - such as butter, cream, ghee, regular-fat milk and cheese. Meat - such as fatty cuts of  beef, pork and lamb, processed meats like salami, sausages and the skin on chicken. Lard., fatty snack foods, cakes, biscuits, pies and deep fried foods) - Avoid smoking

## 2019-08-11 ENCOUNTER — Ambulatory Visit: Payer: BC Managed Care – PPO | Admitting: Family Medicine

## 2019-09-22 ENCOUNTER — Ambulatory Visit (INDEPENDENT_AMBULATORY_CARE_PROVIDER_SITE_OTHER): Payer: BC Managed Care – PPO | Admitting: Podiatry

## 2019-09-22 ENCOUNTER — Other Ambulatory Visit: Payer: Self-pay

## 2019-09-22 DIAGNOSIS — M659 Synovitis and tenosynovitis, unspecified: Secondary | ICD-10-CM

## 2019-09-22 DIAGNOSIS — B070 Plantar wart: Secondary | ICD-10-CM | POA: Diagnosis not present

## 2019-09-22 DIAGNOSIS — M19072 Primary osteoarthritis, left ankle and foot: Secondary | ICD-10-CM | POA: Diagnosis not present

## 2019-09-22 MED ORDER — MELOXICAM 15 MG PO TABS: 15.0000 mg | ORAL_TABLET | Freq: Every day | ORAL | 1 refills | Status: DC | PRN

## 2019-09-22 NOTE — Progress Notes (Signed)
   HPI: 59 y.o. female presenting today for evaluation of a possible plantar wart to the plantar aspect of the left foot.  Patient states she went to go get a pedicure and the pedicurist told her that she did have a plantar wart to the left plantar forefoot.  She presents today to have it evaluated.  She also has some questions associated to her left foot and ankle.  She does have a history of subtalar joint arthrodesis several years prior and she has had pain and symptoms to the left foot and ankle ever since.  She presents for further treatment evaluation  Past Medical History:  Diagnosis Date  . Anemia    H/O  . Anxiety   . BRCA negative 08/2018   MyRisk neg except NTHL1 VUS  . Complication of anesthesia   . Family history of breast cancer 08/2018   IBIS=8.65/riskscore=7.9%  . Family history of ovarian cancer    MyRisk neg except NTHL1 VUS  . GERD (gastroesophageal reflux disease)    NO MEDS  . Hyperlipidemia   . Hypertension   . Hypothyroidism    Hashimoto's per patient  . IFG (impaired fasting glucose) 08/05/2015  . Metabolic syndrome 06/22/8117  . Palpitations 2012   Evaluated by Ida Rogue, MD, Holter moniter  . PONV (postoperative nausea and vomiting)   . Rectal mass   . Thyroid disease    unspecified hypothyroidism     Physical Exam: General: The patient is alert and oriented x3 in no acute distress.  Dermatology: Skin is warm, dry and supple bilateral lower extremities. Negative for open lesions or macerations.  Hyperkeratotic callus noted to the left plantar forefoot with a central nucleated core and pinpoint bleeding with debridement consistent with a plantar verruca  Vascular: Palpable pedal pulses bilaterally. No edema or erythema noted. Capillary refill within normal limits.  Neurological: Epicritic and protective threshold grossly intact bilaterally.   Musculoskeletal Exam: Range of motion within normal limits to all pedal and ankle joints bilateral. Muscle  strength 5/5 in all groups bilateral.  There is some pain on palpation to the lateral aspect of the left ankle joint.  Negative range of motion to the subtalar joint.  Assessment: 1.  Plantar verruca left foot 2. H/o isolated subtalar joint arthrodesis left 3.  DJD left foot and ankle  Plan of Care:  1. Patient evaluated.  2.  Debridement of the plantar verruca was performed using a chisel blade.  Salicylic acid was applied with a light dressing.  Recommend OTC wart remover x2-3 weeks 3.  Again we discussed the patient's history of prior surgery and explained that she is dealing with symptoms of generalized arthritis to the left foot and ankle.  Recommend meloxicam as needed 4.  Refill prescription for meloxicam 15 mg as needed 5.  Return to clinic as needed      Edrick Kins, DPM [facility]  Dr. Edrick Kins, DPM    [address], [place] [address]                Office [phone]  Fax [phone]

## 2019-09-27 ENCOUNTER — Ambulatory Visit: Payer: BC Managed Care – PPO | Admitting: Internal Medicine

## 2019-09-28 ENCOUNTER — Other Ambulatory Visit: Payer: Self-pay | Admitting: Nurse Practitioner

## 2019-09-28 DIAGNOSIS — Z1231 Encounter for screening mammogram for malignant neoplasm of breast: Secondary | ICD-10-CM

## 2019-10-02 NOTE — Telephone Encounter (Signed)
Colonoscopy not done.

## 2019-10-18 ENCOUNTER — Other Ambulatory Visit: Payer: Self-pay | Admitting: Physician Assistant

## 2019-10-18 DIAGNOSIS — M67911 Unspecified disorder of synovium and tendon, right shoulder: Secondary | ICD-10-CM

## 2019-10-22 ENCOUNTER — Ambulatory Visit: Payer: BC Managed Care – PPO

## 2019-10-22 ENCOUNTER — Ambulatory Visit
Admission: RE | Admit: 2019-10-22 | Discharge: 2019-10-22 | Disposition: A | Discharge status: Home / Self Care | Payer: BC Managed Care – PPO | Source: Ambulatory Visit | Attending: Physician Assistant | Admitting: Physician Assistant

## 2019-10-22 DIAGNOSIS — M67911 Unspecified disorder of synovium and tendon, right shoulder: Secondary | ICD-10-CM | POA: Diagnosis not present

## 2019-11-01 ENCOUNTER — Inpatient Hospital Stay: Admission: RE | Admit: 2019-11-01 | Payer: BC Managed Care – PPO | Source: Ambulatory Visit

## 2019-11-03 ENCOUNTER — Ambulatory Visit: Payer: BC Managed Care – PPO

## 2019-11-06 DIAGNOSIS — S46011A Strain of muscle(s) and tendon(s) of the rotator cuff of right shoulder, initial encounter: Secondary | ICD-10-CM | POA: Insufficient documentation

## 2019-11-06 DIAGNOSIS — M7581 Other shoulder lesions, right shoulder: Secondary | ICD-10-CM | POA: Insufficient documentation

## 2019-11-15 ENCOUNTER — Inpatient Hospital Stay: Admission: RE | Admit: 2019-11-15 | Payer: BC Managed Care – PPO | Source: Ambulatory Visit

## 2019-12-19 ENCOUNTER — Ambulatory Visit: Payer: BC Managed Care – PPO | Admitting: Podiatry

## 2020-01-15 ENCOUNTER — Other Ambulatory Visit: Payer: Self-pay | Admitting: Podiatry

## 2020-01-16 ENCOUNTER — Other Ambulatory Visit: Payer: Self-pay

## 2020-01-16 ENCOUNTER — Ambulatory Visit: Payer: BC Managed Care – PPO | Admitting: Podiatry

## 2020-01-16 DIAGNOSIS — M19072 Primary osteoarthritis, left ankle and foot: Secondary | ICD-10-CM | POA: Diagnosis not present

## 2020-01-16 DIAGNOSIS — M659 Synovitis and tenosynovitis, unspecified: Secondary | ICD-10-CM | POA: Diagnosis not present

## 2020-01-16 DIAGNOSIS — T8484XA Pain due to internal orthopedic prosthetic devices, implants and grafts, initial encounter: Secondary | ICD-10-CM

## 2020-01-16 MED ORDER — MELOXICAM 15 MG PO TABS: 15.0000 mg | ORAL_TABLET | Freq: Every day | ORAL | 1 refills | Status: DC | PRN

## 2020-01-16 NOTE — Progress Notes (Signed)
   HPI: 59 y.o. female presenting today for follow-up evaluation and questions associated to her left foot and ankle.  She does have a history of subtalar joint arthrodesis several years prior and she has had pain and symptoms to the left foot and ankle ever since.  She presents for further treatment evaluation  Past Medical History:  Diagnosis Date  . Anemia    H/O  . Anxiety   . BRCA negative 08/2018   MyRisk neg except NTHL1 VUS  . Complication of anesthesia   . Family history of breast cancer 08/2018   IBIS=8.65/riskscore=7.9%  . Family history of ovarian cancer    MyRisk neg except NTHL1 VUS  . GERD (gastroesophageal reflux disease)    NO MEDS  . Hyperlipidemia   . Hypertension   . Hypothyroidism    Hashimoto's per patient  . IFG (impaired fasting glucose) 08/05/2015  . Metabolic syndrome 8/59/0931  . Palpitations 2012   Evaluated by Ida Rogue, MD, Holter moniter  . PONV (postoperative nausea and vomiting)   . Rectal mass   . Thyroid disease    unspecified hypothyroidism     Physical Exam: General: The patient is alert and oriented x3 in no acute distress.  Dermatology: Skin is warm, dry and supple bilateral lower extremities. Negative for open lesions or macerations.  Hyperkeratotic callus noted to the left plantar forefoot with a central nucleated core and pinpoint bleeding with debridement consistent with a plantar verruca  Vascular: Palpable pedal pulses bilaterally. No edema or erythema noted. Capillary refill within normal limits.  Neurological: Epicritic and protective threshold grossly intact bilaterally.   Musculoskeletal Exam: Range of motion within normal limits to all pedal and ankle joints bilateral. Muscle strength 5/5 in all groups bilateral.  There is some pain on palpation to the lateral aspect of the left ankle joint.  Negative range of motion to the subtalar joint.  Assessment: 1.  H/o isolated subtalar joint arthrodesis left 4.  DJD left foot  and ankle  Plan of Care:  1. Patient evaluated.  2. Again we discussed the patient's history of prior surgery and explained that she is dealing with symptoms of generalized arthritis to the left foot and ankle.  Recommend meloxicam as needed 4.  Refill prescription for meloxicam 15 mg as needed 5.  Appointment with Pedorthist for new custom molded orthotics  6.  Return to clinic as needed      Edrick Kins, DPM [facility]  Dr. Edrick Kins, DPM    [address], [place] [address]                Office [phone]  Fax [phone]

## 2020-01-31 ENCOUNTER — Encounter: Payer: Self-pay | Admitting: Podiatry

## 2020-02-11 ENCOUNTER — Encounter: Payer: Self-pay | Admitting: Podiatry

## 2020-02-17 NOTE — Telephone Encounter (Signed)
Just have patient come into the office for surgical consult. - Dr. Amalia Hailey

## 2020-02-20 ENCOUNTER — Ambulatory Visit: Payer: BC Managed Care – PPO | Admitting: Podiatry

## 2020-02-28 ENCOUNTER — Other Ambulatory Visit: Payer: Self-pay

## 2020-02-28 ENCOUNTER — Ambulatory Visit (INDEPENDENT_AMBULATORY_CARE_PROVIDER_SITE_OTHER): Payer: BC Managed Care – PPO | Admitting: Orthotics

## 2020-02-28 DIAGNOSIS — M659 Synovitis and tenosynovitis, unspecified: Secondary | ICD-10-CM | POA: Diagnosis not present

## 2020-02-28 DIAGNOSIS — M19072 Primary osteoarthritis, left ankle and foot: Secondary | ICD-10-CM

## 2020-02-28 NOTE — Progress Notes (Signed)
Repeated previous order for CMFO to address synovitis of L ankle.

## 2020-03-05 ENCOUNTER — Ambulatory Visit (INDEPENDENT_AMBULATORY_CARE_PROVIDER_SITE_OTHER): Payer: BC Managed Care – PPO | Admitting: Podiatry

## 2020-03-05 ENCOUNTER — Other Ambulatory Visit: Payer: Self-pay

## 2020-03-05 ENCOUNTER — Ambulatory Visit (INDEPENDENT_AMBULATORY_CARE_PROVIDER_SITE_OTHER): Payer: BC Managed Care – PPO

## 2020-03-05 DIAGNOSIS — T8484XA Pain due to internal orthopedic prosthetic devices, implants and grafts, initial encounter: Secondary | ICD-10-CM

## 2020-03-05 DIAGNOSIS — G5792 Unspecified mononeuropathy of left lower limb: Secondary | ICD-10-CM

## 2020-03-05 MED ORDER — GABAPENTIN 100 MG PO CAPS: 100.0000 mg | ORAL_CAPSULE | Freq: Every day | ORAL | 1 refills | Status: DC

## 2020-03-05 NOTE — Progress Notes (Signed)
   HPI: 59 y.o. female presenting today for follow-up evaluation and questions associated to her left foot and ankle.  She does have a history of subtalar joint arthrodesis several years prior and she has had pain and symptoms to the left foot and ankle ever since.  She presents for further treatment evaluation  Past Medical History:  Diagnosis Date  . Anemia    H/O  . Anxiety   . BRCA negative 08/2018   MyRisk neg except NTHL1 VUS  . Complication of anesthesia   . Family history of breast cancer 08/2018   IBIS=8.65/riskscore=7.9%  . Family history of ovarian cancer    MyRisk neg except NTHL1 VUS  . GERD (gastroesophageal reflux disease)    NO MEDS  . Hyperlipidemia   . Hypertension   . Hypothyroidism    Hashimoto's per patient  . IFG (impaired fasting glucose) 08/05/2015  . Metabolic syndrome 10/13/2016  . Palpitations 2012   Evaluated by Timothy Gollan, MD, Holter moniter  . PONV (postoperative nausea and vomiting)   . Rectal mass   . Thyroid disease    unspecified hypothyroidism     Physical Exam: General: The patient is alert and oriented x3 in no acute distress.  Dermatology: Skin is warm, dry and supple bilateral lower extremities. Negative for open lesions or macerations.    Vascular: Palpable pedal pulses bilaterally. No edema or erythema noted. Capillary refill within normal limits.  Neurological: Epicritic and protective threshold grossly intact bilaterally.  There is some paresthesia with neuritis noted to the left foot  Musculoskeletal Exam: Range of motion within normal limits to all pedal and ankle joints bilateral. Muscle strength 5/5 in all groups bilateral.  There is some pain on palpation to the lateral aspect of the left ankle joint.  Negative range of motion to the subtalar joint.  Radiographic exam: Foot unchanged since prior x-rays.  Orthopedic hardware noted traversing the subtalar joint and first metatarsocuneiform joint of the left foot.  Hardware  appears intact and stable.  Assessment: 1.  H/o isolated subtalar joint arthrodesis left 2.  Neuritis left foot 3.  Minimally symptomatic orthopedic hardware left foot 4.  DJD left foot and ankle   Plan of Care:  1. Patient evaluated.  2. Again we discussed the patient's history of prior surgery and explained that she is dealing with symptoms of generalized arthritis to the left foot and ankle.  Recommend meloxicam as needed 3.  Patient is having some neuritis pins-and-needles sensation to the left foot.  Prescription for gabapentin 100 mg nightly 4.  Continue meloxicam 15 mg as needed 5.  I explained to the patient that I do not believe that most of her symptoms are coming from the orthopedic screws in her foot.  Do not feel there is much benefit to removing the screws since they are stable and intact 6.  Return to clinic as needed     Brent M. Evans, DPM [facility]  Dr. Brent M. Evans, DPM    [address]                                        , [place] [address]                Office [phone]  Fax [phone]     

## 2020-03-28 ENCOUNTER — Other Ambulatory Visit: Payer: Self-pay | Admitting: *Deleted

## 2020-03-28 DIAGNOSIS — M79606 Pain in leg, unspecified: Secondary | ICD-10-CM

## 2020-04-11 ENCOUNTER — Encounter (HOSPITAL_COMMUNITY): Payer: BC Managed Care – PPO

## 2020-04-15 ENCOUNTER — Ambulatory Visit (INDEPENDENT_AMBULATORY_CARE_PROVIDER_SITE_OTHER): Payer: BC Managed Care – PPO | Admitting: Obstetrics & Gynecology

## 2020-04-15 ENCOUNTER — Encounter: Payer: Self-pay | Admitting: Obstetrics & Gynecology

## 2020-04-15 ENCOUNTER — Other Ambulatory Visit: Payer: Self-pay

## 2020-04-15 VITALS — BP 140/90 | Ht 67.0 in | Wt 221.0 lb

## 2020-04-15 DIAGNOSIS — R1031 Right lower quadrant pain: Secondary | ICD-10-CM | POA: Diagnosis not present

## 2020-04-15 DIAGNOSIS — Z8041 Family history of malignant neoplasm of ovary: Secondary | ICD-10-CM | POA: Diagnosis not present

## 2020-04-15 NOTE — Progress Notes (Signed)
Abdominal Pain Patient is a 60 yo [id number] WF who presents for evaluation of abdominal pain. The pain is described as aching and colicky, and is 0/93 in intensity. Pain is located in the RLQ area without radiation. Onset was insidious occurring 2 weeks ago. Symptoms have been gradually improving since. Aggravating factors: none. Alleviating factors: none. Associated symptoms: none. The patient denies anorexia, chills, constipation, diarrhea, fever, nausea and vomiting. Risk factors for pelvic/abdominal pain include FH Ovarian cancer..   Pt also has urinary freq, urgency, and LOU at times w the urgency, and this has been going on for many mos.  PMHx: She  has a past medical history of Anemia, Anxiety, BRCA negative (23/5573), Complication of anesthesia, Family history of breast cancer (08/2018), Family history of ovarian cancer, GERD (gastroesophageal reflux disease), Hyperlipidemia, Hypertension, Hypothyroidism, IFG (impaired fasting glucose) (05/05/2540), Metabolic syndrome (09/19/2374), Palpitations (2012), PONV (postoperative nausea and vomiting), Rectal mass, and Thyroid disease. Also,  has a past surgical history that includes Dilation and curettage of uterus; Cesarean section; left foot surgery; Rectal exam under anesthesia (N/A, 11/30/2014); Anal fistulotomy (N/A, 11/30/2014); Endometrial ablation w/ novasure (05/29/2009); Colonoscopy (2014); and Breast biopsy (Right, 09/08/2018)., family history includes Alzheimer's disease in her father and mother; Breast cancer (age of onset: 70) in an other family member; Breast cancer (age of onset: 67) in her maternal aunt; Breast cancer (age of onset: 90) in her paternal aunt; Depression in her father; Diverticulitis in her father; Heart attack in her maternal grandfather; Heart disease (age of onset: 77) in her mother; Hernia in her father; Hypertension in her brother and mother; Hypothyroidism in her daughter, daughter, sister, and sister; Ovarian cancer in her  sister and sister; Stroke in her maternal grandfather and paternal grandmother; Uterine cancer (age of onset: 63) in her sister.,  reports that she has never smoked. She has never used smokeless tobacco. She reports that she does not drink alcohol and does not use drugs.  She has a current medication list which includes the following prescription(s): levothyroxine, losartan, gabapentin, meloxicam, and montelukast. Also, is allergic to scopolamine.  Review of Systems  Constitutional: Negative for chills, fever and malaise/fatigue.  HENT: Negative for congestion, sinus pain and sore throat.   Eyes: Negative for blurred vision and pain.  Respiratory: Negative for cough and wheezing.   Cardiovascular: Negative for chest pain and leg swelling.  Gastrointestinal: Positive for abdominal pain. Negative for constipation, diarrhea, heartburn, nausea and vomiting.  Genitourinary: Positive for frequency and urgency. Negative for dysuria and hematuria.  Musculoskeletal: Negative for back pain, joint pain, myalgias and neck pain.  Skin: Negative for itching and rash.  Neurological: Negative for dizziness, tremors and weakness.  Endo/Heme/Allergies: Does not bruise/bleed easily.  Psychiatric/Behavioral: Negative for depression. The patient is not nervous/anxious and does not have insomnia.     Objective: BP 140/90   Ht _0  (1.702 m)   Wt 221 lb (100.2 kg)   BMI 34.61 kg/m  Physical Exam Constitutional:      General: She is not in acute distress.    Appearance: She is well-developed.  Genitourinary:     Vagina and uterus normal.     Right Labia: No rash or tenderness.    Left Labia: No tenderness or rash.    No vaginal erythema or bleeding.      Right Adnexa: tender.    Right Adnexa: no mass present.    Left Adnexa: not tender and no mass present.    No cervical motion tenderness, discharge,  polyp or nabothian cyst.     Uterus is mobile.     Uterus is not enlarged.     No uterine mass  detected.    Uterus is midaxial.     Pelvic exam was performed with patient supine.  HENT:     Head: Normocephalic and atraumatic.     Nose: Nose normal.  Abdominal:     General: There is no distension.     Palpations: Abdomen is soft.     Tenderness: There is no abdominal tenderness.  Musculoskeletal:        General: Normal range of motion.  Neurological:     Mental Status: She is alert and oriented to person, place, and time.     Cranial Nerves: No cranial nerve deficit.  Skin:    General: Skin is warm and dry.  Psychiatric:        Attention and Perception: Attention normal.        Mood and Affect: Mood and affect normal.        Speech: Speech normal.        Behavior: Behavior normal.        Thought Content: Thought content normal.        Judgment: Judgment normal.     UA NEG  ASSESSMENT/PLAN:    Problem List Items Addressed This Visit      Other   Family history of ovarian cancer   Relevant Orders   US PELVIC COMPLETE WITH TRANSVAGINAL    Other Visit Diagnoses    RLQ abdominal pain    -  Primary   Relevant Orders   US PELVIC COMPLETE WITH TRANSVAGINAL    Pain could be arising from one of from multiple sources/ possibilities [place] to rule out ovarian etiology, esp as she has FH ovarian cancer and is concerned.  GI and other etiology possibilities discussed.  Low suspicion for appendicitis based on clinical hx and exam today.  Also discussed OAB and tx options for this problem if becomes more disruptive to lifestyle.  Barnett Applebaum, MD, [facility], [facility] 04/15/2020  10:21 AM

## 2020-04-15 NOTE — Patient Instructions (Signed)
Thank you for choosing [facility]. As part of our ongoing efforts to improve patient experience, we would appreciate your feedback. Please fill out the short survey that you will receive by mail or MyChart. Your opinion is important to [place]! -Dr Kenton Kingfisher   Transvaginal Ultrasound A transvaginal ultrasound, also called an endovaginal ultrasound, is a test that uses sound waves to take pictures of the female genital tract. The pictures are taken with a device, called a transducer, that is placed in the vagina. This test may be done to:  Check for problems with your pregnancy.  Check your developing baby.  Check for anything abnormal in the uterus or ovaries.  Find out why you have pelvic pain or bleeding. Tell a health care provider about:  Any allergies you have.  All medicines you are taking, including vitamins, herbs, eye drops, creams, and over-the-counter medicines.  Any blood disorders you have.  Any surgeries you have had.  Any medical conditions you have.  Whether you are pregnant or may be pregnant.  Whether you are having your menstrual period. What are the risks? This is a safe procedure. There are no known risks or complications of having this test. What happens before the procedure? This procedure needs to be done when your bladder is empty. Follow your health care provider's instructions about drinking fluids and emptying your bladder before the test. What happens during the procedure?  You will empty your bladder before the procedure.  You will undress from the waist down.  You will lie down on an exam table, with your knees bent and your feet in foot holders.  A health care provider will cover the transducer with a sterile cover.  A gel will be put on the transducer. The gel helps transmit the sound waves and prevents irritation of your vagina.  The technician will insert the transducer into your vagina to get images. These will be displayed on a monitor  that looks like a small television screen.  The transducer will be removed when the procedure is complete. The procedure may vary among health care providers and hospitals.   What happens after the procedure?  It is up to you to get the results of your procedure. Ask your health care provider, or the department that is doing the procedure, when your results will be ready.  Keep all follow-up visits as told by your health care provider. This is important. Summary  A transvaginal ultrasound, also called an endovaginal ultrasound, is a test that uses sound waves to take pictures of the female genital tract.  This is a safe procedure. There are no known risks associated with this test.  The procedure needs to be done when your bladder is empty. Follow your health care provider's instructions about drinking fluids and emptying your bladder before the test.  During the procedure, you will undress from the waist down and lie down on an exam table. A technician will insert a transducer into your vagina to obtain images.  Ask your health care provider, or the department that is doing the procedure, when your results will be ready. This information is not intended to replace advice given to you by your health care provider. Make sure you discuss any questions you have with your health care provider. Document Revised: 10/13/2017 Document Reviewed: 10/13/2017 Elsevier Patient Education  [employer].

## 2020-04-16 ENCOUNTER — Ambulatory Visit (INDEPENDENT_AMBULATORY_CARE_PROVIDER_SITE_OTHER): Payer: BC Managed Care – PPO

## 2020-04-16 DIAGNOSIS — R1031 Right lower quadrant pain: Secondary | ICD-10-CM | POA: Diagnosis not present

## 2020-04-16 DIAGNOSIS — Z8041 Family history of malignant neoplasm of ovary: Secondary | ICD-10-CM | POA: Diagnosis not present

## 2020-04-17 ENCOUNTER — Other Ambulatory Visit: Payer: Self-pay

## 2020-04-17 ENCOUNTER — Ambulatory Visit (HOSPITAL_COMMUNITY)
Admission: RE | Admit: 2020-04-17 | Discharge: 2020-04-17 | Disposition: A | Discharge status: Home / Self Care | Payer: BC Managed Care – PPO | Source: Ambulatory Visit | Attending: Vascular Surgery | Admitting: Vascular Surgery

## 2020-04-17 ENCOUNTER — Encounter (HOSPITAL_COMMUNITY): Payer: Self-pay

## 2020-04-17 DIAGNOSIS — M79606 Pain in leg, unspecified: Secondary | ICD-10-CM | POA: Diagnosis not present

## 2020-04-17 DIAGNOSIS — M79604 Pain in right leg: Secondary | ICD-10-CM

## 2020-04-19 ENCOUNTER — Encounter: Payer: Self-pay | Admitting: Obstetrics & Gynecology

## 2020-04-19 ENCOUNTER — Ambulatory Visit (INDEPENDENT_AMBULATORY_CARE_PROVIDER_SITE_OTHER): Payer: BC Managed Care – PPO | Admitting: Obstetrics & Gynecology

## 2020-04-19 DIAGNOSIS — D251 Intramural leiomyoma of uterus: Secondary | ICD-10-CM | POA: Diagnosis not present

## 2020-04-19 DIAGNOSIS — R1031 Right lower quadrant pain: Secondary | ICD-10-CM | POA: Diagnosis not present

## 2020-04-19 NOTE — Progress Notes (Signed)
Virtual Visit via Telephone Note  I connected with Desiree Walker on 04/19/20 at  4:10 PM EST by telephone and verified that I am speaking with the correct person using two identifiers.  Location: Patient: home w husband on phone call Provider: office   I discussed the limitations, risks, security and privacy concerns of performing an evaluation and management service by telephone and the availability of in person appointments. I also discussed with the patient that there may be a patient responsible charge related to this service. The patient expressed understanding and agreed to proceed.   History of Present Illness: Uterine Fibroids Patient presents with uterine fibroids.  She has known of small one in the past, but never had sx's.  Now she has RLQ pain and pressure. Periods are absent. No intermenstrual bleeding, spotting, or discharge.  The patient denies anorexia, chills, constipation, diarrhea, fever, nausea and vomiting.  She has some bladder pressure.  Ultrasound demonstrates lower uterine segment location of a 4 cm fibroid. Prior US 07/2018, 2 cm fibroid then.  PMHx: She  has a past medical history of Anemia, Anxiety, BRCA negative (17/4081), Complication of anesthesia, Family history of breast cancer (08/2018), Family history of ovarian cancer, GERD (gastroesophageal reflux disease), Hyperlipidemia, Hypertension, Hypothyroidism, IFG (impaired fasting glucose) (4/48/1856), Metabolic syndrome (05/27/9700), Palpitations (2012), PONV (postoperative nausea and vomiting), Rectal mass, and Thyroid disease. Also,  has a past surgical history that includes Dilation and curettage of uterus; Cesarean section; left foot surgery; Rectal exam under anesthesia (N/A, 11/30/2014); Anal fistulotomy (N/A, 11/30/2014); Endometrial ablation w/ novasure (05/29/2009); Colonoscopy (2014); and Breast biopsy (Right, 09/08/2018)., family history includes Alzheimer's disease in her father and mother; Breast cancer  (age of onset: 69) in an other family member; Breast cancer (age of onset: 46) in her maternal aunt; Breast cancer (age of onset: 68) in her paternal aunt; Depression in her father; Diverticulitis in her father; Heart attack in her maternal grandfather; Heart disease (age of onset: 57) in her mother; Hernia in her father; Hypertension in her brother and mother; Hypothyroidism in her daughter, daughter, sister, and sister; Ovarian cancer in her sister and sister; Stroke in her maternal grandfather and paternal grandmother; Uterine cancer (age of onset: 64) in her sister.,  reports that she has never smoked. She has never used smokeless tobacco. She reports that she does not drink alcohol and does not use drugs.  She has a current medication list which includes the following prescription(s): gabapentin, levothyroxine, losartan, meloxicam, and montelukast. Also, is allergic to scopolamine.  Review of Systems  All other systems reviewed and are negative.    Observations/Objective: No exam today, due to telephone eVisit due to [facility] virus restriction on elective visits and procedures.  Prior visits reviewed along with ultrasounds/labs as indicated.  Assessment and Plan:   ICD-10-CM   1. Intramural leiomyoma of uterus  D25.1   2. RLQ abdominal pain  R10.31     Follow Up Instructions: Pt to decide between surgery (hysterectomy) vs expectant management (f/u US 3-6 mos).  Info on hysterectomy given to pt (MyChart)   I discussed the assessment and treatment plan with the patient. The patient was provided an opportunity to ask questions and all were answered. The patient agreed with the plan and demonstrated an understanding of the instructions.   The patient was advised to call back or seek an in-person evaluation if the symptoms worsen or if the condition fails to improve as anticipated.  I provided 20 minutes of non-face-to-face time during  this encounter.   Hoyt Koch, MD

## 2020-04-19 NOTE — Patient Instructions (Signed)
Total Laparoscopic Hysterectomy A total laparoscopic hysterectomy is a minimally invasive surgery to remove the uterus and cervix. The fallopian tubes and ovaries can also be removed during this surgery, if necessary. This procedure may be done to treat problems such as:  Growths in the uterus (uterine fibroids) that are not cancer but cause symptoms.  A condition that causes the lining of the uterus to grow in other areas (endometriosis).  Problems with pelvic support.  Cancer of the cervix, ovaries, uterus, or tissue that lines the uterus (endometrium).  Excessive bleeding in the uterus. After this procedure, you will no longer be able to have a baby, and you will no longer have a menstrual period. Tell a health care provider about:  Any allergies you have.  All medicines you are taking, including vitamins, herbs, eye drops, creams, and over-the-counter medicines.  Any problems you or family members have had with anesthetic medicines.  Any blood disorders you have.  Any surgeries you have had.  Any medical conditions you have.  Whether you are pregnant or may be pregnant. What are the risks? Generally, this is a safe procedure. However, problems may occur, including:  Infection.  Bleeding.  Blood clots in the legs or lungs.  Allergic reactions to medicines.  Damage to nearby structures or organs.  Having to change from this surgery to one in which a large incision is made in the abdomen (abdominal hysterectomy). What happens before the procedure? Staying hydrated Follow instructions from your health care provider about hydration, which may include:  Up to 2 hours before the procedure - you may continue to drink clear liquids, such as water, clear fruit juice, black coffee, and plain tea.   Eating and drinking restrictions Follow instructions from your health care provider about eating and drinking, which may include:  8 hours before the procedure - stop eating  heavy meals or foods, such as meat, fried foods, or fatty foods.  6 hours before the procedure - stop eating light meals or foods, such as toast or cereal.  6 hours before the procedure - stop drinking milk or drinks that contain milk.  2 hours before the procedure - stop drinking clear liquids. Medicines  Ask your health care provider about: ? Changing or stopping your regular medicines. This is especially important if you are taking diabetes medicines or blood thinners. ? Taking medicines such as aspirin and ibuprofen. These medicines can thin your blood. Do not take these medicines unless your health care provider tells you to take them. ? Taking over-the-counter medicines, vitamins, herbs, and supplements.  You may be asked to take medicine that helps you have a bowel movement (laxative) to prevent constipation. General instructions  If you were asked to do bowel preparation before the procedure, follow instructions from your health care provider.  This procedure can affect the way you feel about yourself. Talk with your health care provider about the physical and emotional changes hysterectomy may cause.  Do not use any products that contain nicotine or tobacco for at least 4 weeks before the procedure. These products include cigarettes, chewing tobacco, and vaping devices, such as e-cigarettes. If you need help quitting, ask your health care provider.  Plan to have a responsible adult take you home from the hospital or clinic.  Plan to have a responsible adult care for you for the time you are told after you leave the hospital or clinic. This is important. Surgery safety Ask your health care provider:  How your surgery  site will be marked.  What steps will be taken to help prevent infection. These may include: ? Removing hair at the surgery site. ? Washing skin with a germ-killing soap. ? Receiving antibiotic medicine. What happens during the procedure?  An IV will be  inserted into one of your veins.  You will be given one or more of the following: ? A medicine to help you relax (sedative). ? A medicine to make you fall asleep (general anesthetic). ? A medicine to numb the area (local anesthetic). ? A medicine that is injected into your spine to numb the area below and slightly above the injection site (spinal anesthetic). ? A medicine that is injected into an area of your body to numb everything below the injection site (regional anesthetic).  A gas will be used to inflate your abdomen. This will allow your surgeon to look inside your abdomen and do the surgery.  Three or four small incisions will be made in your abdomen.  A small device with a light (laparoscope) will be inserted into one of your incisions. Surgical instruments will be inserted through the other incisions in order to perform the procedure.  Your uterus and cervix may be removed through your vagina or cut into small pieces and removed through the small incisions. Any other organs that need to be removed will also be removed this way.  The gas will be released from inside your abdomen.  Your incisions will be closed with stitches (sutures), skin glue, or adhesive strips.  A bandage (dressing) may be placed over your incisions. The procedure may vary among health care providers and hospitals. What happens after the procedure?  Your blood pressure, heart rate, breathing rate, and blood oxygen level will be monitored until you leave the hospital or clinic.  You will be given medicine for pain as needed.  You will be encouraged to walk as soon as possible. You will also use a device to help you breathe or do breathing exercises to keep your lungs clear.  You may have to wear compression stockings. These stockings help to prevent blood clots and reduce swelling in your legs.  You will need to wear a sanitary pad for vaginal discharge or bleeding. Summary  Total laparoscopic  hysterectomy is a procedure to remove your uterus, cervix, and sometimes the fallopian tubes and ovaries.  This procedure can affect the way you feel about yourself. Talk with your health care provider about the physical and emotional changes hysterectomy may cause.  After this procedure, you will no longer be able to have a baby, and you will no longer have a menstrual period.  You will be given pain medicine to control discomfort after this procedure.  Plan to have a responsible adult take you home from the hospital or clinic. This information is not intended to replace advice given to you by your health care provider. Make sure you discuss any questions you have with your health care provider. Document Revised: 11/03/2019 Document Reviewed: 11/03/2019 Elsevier Patient Education  [place].

## 2020-04-22 ENCOUNTER — Telehealth: Payer: Self-pay

## 2020-04-22 NOTE — Telephone Encounter (Signed)
Patient requesting a detailed work note concerning her fibroid that has doubled in size in the past 1.5 years. She wants proof instead of her just giving a verbal to her employer. [phone]

## 2020-04-22 NOTE — Telephone Encounter (Signed)
Spoke w/patient. Advised RPH out of office until tomorrow. She needs the note d/t being out recently on FMLA for rotator and bicep surgery. She has exhausted her FMLA and is to return to work Thursday. Given that she is already out and the fibroid has doubled in size. She would prefer to go ahead and have it out now than wait 6 mos. She's concerned d/t her  sisters having uterine cancer.

## 2020-04-23 ENCOUNTER — Other Ambulatory Visit: Payer: Self-pay | Admitting: Obstetrics & Gynecology

## 2020-04-23 NOTE — Telephone Encounter (Signed)
Desiree Walker to take letter to front desk for p/u.

## 2020-04-23 NOTE — Telephone Encounter (Signed)
Letter done in chart and also signed/ printed (at our work station).  She what she wants to be done about getting a copy.  Also, if desires surgery let me know so I can schedule thru [facility].

## 2020-04-23 NOTE — Telephone Encounter (Signed)
Spoke w/patient. She will come by the office to pick up the letter. She would like to discuss surgery with RPH a little more before scheduling. She will reach out by phone or schedule apt for this.

## 2020-04-25 ENCOUNTER — Telehealth: Payer: Self-pay

## 2020-04-25 NOTE — Telephone Encounter (Signed)
Pt called and left me a msg with only her name and DOB. She asked that I rtn the call. I did but her v/m is full and I was unable to leave msg.

## 2020-04-25 NOTE — Telephone Encounter (Signed)
Patient is added to schedule tomorrow 11:20

## 2020-04-26 ENCOUNTER — Encounter: Payer: Self-pay | Admitting: Obstetrics & Gynecology

## 2020-04-26 ENCOUNTER — Telehealth: Payer: Self-pay

## 2020-04-26 ENCOUNTER — Ambulatory Visit (INDEPENDENT_AMBULATORY_CARE_PROVIDER_SITE_OTHER): Payer: BC Managed Care – PPO | Admitting: Obstetrics & Gynecology

## 2020-04-26 ENCOUNTER — Other Ambulatory Visit: Payer: Self-pay

## 2020-04-26 VITALS — Ht 67.0 in | Wt 220.0 lb

## 2020-04-26 DIAGNOSIS — R1031 Right lower quadrant pain: Secondary | ICD-10-CM | POA: Diagnosis not present

## 2020-04-26 DIAGNOSIS — Z8041 Family history of malignant neoplasm of ovary: Secondary | ICD-10-CM | POA: Diagnosis not present

## 2020-04-26 DIAGNOSIS — D251 Intramural leiomyoma of uterus: Secondary | ICD-10-CM | POA: Diagnosis not present

## 2020-04-26 NOTE — Patient Instructions (Signed)
Total Laparoscopic Hysterectomy, Care After The following information offers guidance on how to care for yourself after your procedure. Your health care provider may also give you more specific instructions. If you have problems or questions, contact your health care provider. What can I expect after the procedure? After the procedure, it is common to have:  Pain, bruising, and numbness around your incisions.  Tiredness (fatigue).  Poor appetite.  Less interest in sex.  Vaginal discharge or bleeding. You will need to use a sanitary pad after this procedure.  Feelings of sadness or other emotions. If your ovaries were also removed, it is also common to have symptoms of menopause, such as hot flashes, night sweats, and lack of sleep (insomnia). Follow these instructions at home: Medicines  Take over-the-counter and prescription medicines only as told by your health care provider.  Ask your health care provider if the medicine prescribed to you: ? Requires you to avoid driving or using machinery. ? Can cause constipation. You may need to take these actions to prevent or treat constipation:  Drink enough fluid to keep your urine pale yellow.  Take over-the-counter or prescription medicines.  Eat foods that are high in fiber, such as beans, whole grains, and fresh fruits and vegetables.  Limit foods that are high in fat and processed sugars, such as fried or sweet foods. Incision care  Follow instructions from your health care provider about how to take care of your incisions. Make sure you: ? Wash your hands with soap and water for at least 20 seconds before and after you change your bandage (dressing). If soap and water are not available, use hand sanitizer. ? Change your dressing as told by your health care provider. ? Leave stitches (sutures), skin glue, or adhesive strips in place. These skin closures may need to stay in place for 2 weeks or longer. If adhesive strip edges start  to loosen and curl up, you may trim the loose edges. Do not remove adhesive strips completely unless your health care provider tells you to do that.  Check your incision areas every day for signs of infection. Check for: ? More redness, swelling, or pain. ? Fluid or blood. ? Warmth. ? Pus or a bad smell.   Activity  Rest as told by your health care provider.  Avoid sitting for a long time without moving. Get up to take short walks every 1-2 hours. This is important to improve blood flow and breathing. Ask for help if you feel weak or unsteady.  Return to your normal activities as told by your health care provider. Ask your health care provider what activities are safe for you.  Do not lift anything that is heavier than 10 lb (4.5 kg), or the limit that you are told, for one month after surgery or until your health care provider says that it is safe.  If you were given a sedative during the procedure, it can affect you for several hours. Do not drive or operate machinery until your health care provider says that it is safe.   Lifestyle  Do not use any products that contain nicotine or tobacco. These products include cigarettes, chewing tobacco, and vaping devices, such as e-cigarettes. These can delay healing after surgery. If you need help quitting, ask your health care provider.  Do not drink alcohol until your health care provider approves. General instructions  Do not douche, use tampons, or have sex for at least 6 weeks, or as told by your health   care provider.  If you struggle with physical or emotional changes after your procedure, speak with your health care provider or a therapist.  Do not take baths, swim, or use a hot tub until your health care provider approves. You may only be allowed to take showers for 2-3 weeks.  Keep your dressing dry until your health care provider says it can be removed.  Try to have someone at home with you for the first 1-2 weeks to help with your  daily chores.  Wear compression stockings as told by your health care provider. These stockings help to prevent blood clots and reduce swelling in your legs.  Keep all follow-up visits. This is important.   Contact a health care provider if:  You have any of these signs of infection: ? Chills or a fever. ? More redness, swelling, or pain around an incision. ? Fluid or blood coming from an incision. ? Warmth coming from an incision. ? Pus or a bad smell coming from an incision.  An incision opens.  You feel dizzy or light-headed.  You have pain or bleeding when you urinate, or you are unable to urinate.  You have abnormal vaginal discharge.  You have pain that does not get better with medicine. Get help right away if:  You have a fever and your symptoms suddenly get worse.  You have severe abdominal pain.  You have chest pain or shortness of breath.  You faint.  You have pain, swelling, or redness in your leg.  You have heavy vaginal bleeding with blood clots, soaking through a sanitary pad in less than 1 hour. These symptoms may represent a serious problem that is an emergency. Do not wait to see if the symptoms will go away. Get medical help right away. Call your local emergency services (911 in the [place] Do not drive yourself to the hospital. Summary  After the procedure, it is common to have pain and bruising around your incisions.  Do not take baths, swim, or use a hot tub until your health care provider approves.  Do not lift anything that is heavier than 10 lb (4.5 kg), or the limit that you are told, for one month after surgery or until your health care provider says that it is safe.  Tell your health care provider if you have any signs or symptoms of infection after the procedure.  Get help right away if you have severe abdominal pain, chest pain, shortness of breath, or heavy bleeding from your vagina. This information is not intended to replace advice given  to you by your health care provider. Make sure you discuss any questions you have with your health care provider. Document Revised: 11/03/2019 Document Reviewed: 11/03/2019 Elsevier Patient Education  2021 Elsevier Inc.  

## 2020-04-26 NOTE — Telephone Encounter (Signed)
-----   Message from Gae Dry, MD sent at 04/26/2020 11:39 AM EST ----- Regarding: Surgery Surgery Booking Request Patient Full Name:  Desiree Walker  MRN: [id number]  DOB: [birth year]  Surgeon: Hoyt Koch, MD  Requested Surgery Date and Time: Soon Primary Diagnosis AND Code:    1. Intramural leiomyoma of uterus  D25.1  2. RLQ abdominal pain  R10.31  3. Family history of ovarian cancer  Z80.41  Secondary Diagnosis and Code:  Surgical Procedure: TLH/BSO RNFA Requested?: No L&D Notification: No Admission Status: same day surgery Length of Surgery: 75 min Special Case Needs: No H&P: Yes Phone Interview???:  Yes Interpreter: No Medical Clearance:  No Special Scheduling Instructions: No Any known health/anesthesia issues, diabetes, sleep apnea, latex allergy, defibrillator/pacemaker?: No Acuity: P3   (P1 highest, P2 delay may cause harm, P3 low, elective gyn, P4 lowest)

## 2020-04-26 NOTE — Progress Notes (Signed)
Virtual Visit via Telephone Note  I connected with Desiree Walker on 04/26/20 at 11:20 AM EST by telephone and verified that I am speaking with the correct person using two identifiers.  Location: Patient: Home Provider: Office   I discussed the limitations, risks, security and privacy concerns of performing an evaluation and management service by telephone and the availability of in person appointments. I also discussed with the patient that there may be a patient responsible charge related to this service. The patient expressed understanding and agreed to proceed.   History of Present Illness: Pt has recent dx of fibroids, growing, w relationship to RLQ pain, concern for future worsening of sx's,a nd future worry over FH of ovarian cancer.   Observations/Objective: No exam today, due to telephone eVisit due to [facility] virus restriction on elective visits and procedures.  Prior visits reviewed along with ultrasounds/labs as indicated.  Assessment and Plan:   ICD-10-CM   1. Intramural leiomyoma of uterus  D25.1   2. RLQ abdominal pain  R10.31   3. Family history of ovarian cancer  Z80.41    Discussed pros and cons of surgery, hysterectomy;l recovery expectations  Sch for TLH BSO  Follow Up Instructions: For pre-op   I discussed the assessment and treatment plan with the patient. The patient was provided an opportunity to ask questions and all were answered. The patient agreed with the plan and demonstrated an understanding of the instructions.   The patient was advised to call back or seek an in-person evaluation if the symptoms worsen or if the condition fails to improve as anticipated.  I provided 15 minutes of non-face-to-face time during this encounter.   Hoyt Koch, MD

## 2020-04-26 NOTE — Telephone Encounter (Signed)
Patient called to schedule TLH/BSO w [facility]  I noted in her voice prior to admitting that she is very anxious.  DOS 3/1  H&P 2/21 @ 11:00   Covid testing 2/25 @ 8-10:30, [facility] [employer], drive up and wear mask. Advised pt to quarantine until DOS.  Pre-admit phone call appointment to be requested - date and time will be included on H&P paper work. Also all appointments will be updated on pt MyChart. Explained that this appointment has a call window. Based on the time scheduled will indicate if the call will be received within a 4 hour window before 1:00 or after.  Advised that pt may also receive calls from the hospital pharmacy and pre-service center.  Confirmed pt has [employer] as Chartered certified accountant. No secondary insurance.

## 2020-04-30 ENCOUNTER — Other Ambulatory Visit: Payer: Self-pay

## 2020-04-30 ENCOUNTER — Other Ambulatory Visit: Payer: Self-pay | Admitting: Advanced Practice Midwife

## 2020-04-30 ENCOUNTER — Ambulatory Visit (INDEPENDENT_AMBULATORY_CARE_PROVIDER_SITE_OTHER): Payer: BC Managed Care – PPO

## 2020-04-30 ENCOUNTER — Telehealth: Payer: Self-pay

## 2020-04-30 ENCOUNTER — Other Ambulatory Visit: Payer: Self-pay | Admitting: Obstetrics & Gynecology

## 2020-04-30 DIAGNOSIS — R300 Dysuria: Secondary | ICD-10-CM

## 2020-04-30 LAB — POCT URINALYSIS DIPSTICK
Appearance: NEGATIVE
Bilirubin, UA: NEGATIVE
Blood, UA: NEGATIVE
Glucose, UA: NEGATIVE
Ketones, UA: NEGATIVE
Nitrite, UA: NEGATIVE
Protein, UA: NEGATIVE
Spec Grav, UA: 1.01 (ref 1.010–1.025)
Urobilinogen, UA: 0.2 E.U./dL
pH, UA: 5 (ref 5.0–8.0)

## 2020-04-30 MED ORDER — CEPHALEXIN 500 MG PO CAPS: 500.0000 mg | ORAL_CAPSULE | Freq: Four times a day (QID) | ORAL | 2 refills | Status: DC

## 2020-04-30 MED ORDER — SULFAMETHOXAZOLE-TRIMETHOPRIM 800-160 MG PO TABS: 1.0000 | ORAL_TABLET | Freq: Two times a day (BID) | ORAL | 0 refills | Status: AC

## 2020-04-30 NOTE — Progress Notes (Signed)
Pt calling 12:12pm; was in for a u/a today; bactrim was called in by [facility]; keflex by [facility]. Which one is she supposed to take?  [phone]

## 2020-04-30 NOTE — Telephone Encounter (Signed)
Pt aware the urine culture showed some leukocytes and jane will send in a rx, culture sent off to lab

## 2020-04-30 NOTE — Progress Notes (Signed)
Rx keflex sent to treat UTI symptoms. Culture sent today.

## 2020-04-30 NOTE — Telephone Encounter (Signed)
Rx Keflex sent for patient. Follow up as needed after culture results.

## 2020-04-30 NOTE — Telephone Encounter (Signed)
Please advise 

## 2020-04-30 NOTE — Progress Notes (Signed)
Pt aware rx is being sent Culture sent to lab

## 2020-04-30 NOTE — Progress Notes (Signed)
Send culture and Ill send ABX.  Let her know probable UTI.  (I changed ABX from what Opal Sidles prescribed)

## 2020-04-30 NOTE — Telephone Encounter (Signed)
Can you send in rx for uti.

## 2020-04-30 NOTE — Telephone Encounter (Signed)
Add her to nurse schedule for 11:30 urine drop off, ill get it

## 2020-05-01 ENCOUNTER — Encounter: Payer: Self-pay | Admitting: Orthotics

## 2020-05-02 ENCOUNTER — Ambulatory Visit: Payer: BC Managed Care – PPO | Admitting: Obstetrics & Gynecology

## 2020-05-02 LAB — URINE CULTURE

## 2020-05-02 NOTE — Telephone Encounter (Signed)
Pt aware per PH to take the Bactrim.  Pt states that is what she is taking.

## 2020-05-06 ENCOUNTER — Other Ambulatory Visit: Payer: Self-pay

## 2020-05-06 ENCOUNTER — Encounter: Payer: Self-pay | Admitting: Obstetrics & Gynecology

## 2020-05-06 ENCOUNTER — Ambulatory Visit (INDEPENDENT_AMBULATORY_CARE_PROVIDER_SITE_OTHER): Payer: BC Managed Care – PPO | Admitting: Obstetrics & Gynecology

## 2020-05-06 VITALS — BP 140/90 | Ht 67.5 in | Wt 220.0 lb

## 2020-05-06 DIAGNOSIS — Z8041 Family history of malignant neoplasm of ovary: Secondary | ICD-10-CM

## 2020-05-06 DIAGNOSIS — D251 Intramural leiomyoma of uterus: Secondary | ICD-10-CM

## 2020-05-06 DIAGNOSIS — R1031 Right lower quadrant pain: Secondary | ICD-10-CM

## 2020-05-06 NOTE — Patient Instructions (Signed)
PRE ADMISSION TESTING For Covid, prior to procedure Friday 9:00-10:00 [facility] entrance (drive up)  Results in 48-72 hours You will not receive notification if test results are negative. If positive for Covid19, your provider will notify you by phone, with additional instructions.   Total Laparoscopic Hysterectomy, Care After The following information offers guidance on how to care for yourself after your procedure. Your health care provider may also give you more specific instructions. If you have problems or questions, contact your health care provider. What can I expect after the procedure? After the procedure, it is common to have:  Pain, bruising, and numbness around your incisions.  Tiredness (fatigue).  Poor appetite.  Less interest in sex.  Vaginal discharge or bleeding. You will need to use a sanitary pad after this procedure.  Feelings of sadness or other emotions. If your ovaries were also removed, it is also common to have symptoms of menopause, such as hot flashes, night sweats, and lack of sleep (insomnia). Follow these instructions at home: Medicines  Take over-the-counter and prescription medicines only as told by your health care provider.  Ask your health care provider if the medicine prescribed to you: ? Requires you to avoid driving or using machinery. ? Can cause constipation. You may need to take these actions to prevent or treat constipation:  Drink enough fluid to keep your urine pale yellow.  Take over-the-counter or prescription medicines.  Eat foods that are high in fiber, such as beans, whole grains, and fresh fruits and vegetables.  Limit foods that are high in fat and processed sugars, such as fried or sweet foods. Incision care  Follow instructions from your health care provider about how to take care of your incisions. Make sure you: ? Wash your hands with soap and water for at least 20 seconds before and after you change your  bandage (dressing). If soap and water are not available, use hand sanitizer. ? Change your dressing as told by your health care provider. ? Leave stitches (sutures), skin glue, or adhesive strips in place. These skin closures may need to stay in place for 2 weeks or longer. If adhesive strip edges start to loosen and curl up, you may trim the loose edges. Do not remove adhesive strips completely unless your health care provider tells you to do that.  Check your incision areas every day for signs of infection. Check for: ? More redness, swelling, or pain. ? Fluid or blood. ? Warmth. ? Pus or a bad smell.   Activity  Rest as told by your health care provider.  Avoid sitting for a long time without moving. Get up to take short walks every 1-2 hours. This is important to improve blood flow and breathing. Ask for help if you feel weak or unsteady.  Return to your normal activities as told by your health care provider. Ask your health care provider what activities are safe for you.  Do not lift anything that is heavier than 10 lb (4.5 kg), or the limit that you are told, for one month after surgery or until your health care provider says that it is safe.  If you were given a sedative during the procedure, it can affect you for several hours. Do not drive or operate machinery until your health care provider says that it is safe.   Lifestyle  Do not use any products that contain nicotine or tobacco. These products include cigarettes, chewing tobacco, and vaping devices, such as e-cigarettes. These can  delay healing after surgery. If you need help quitting, ask your health care provider.  Do not drink alcohol until your health care provider approves. General instructions  Do not douche, use tampons, or have sex for at least 6 weeks, or as told by your health care provider.  If you struggle with physical or emotional changes after your procedure, speak with your health care provider or a  therapist.  Do not take baths, swim, or use a hot tub until your health care provider approves. You may only be allowed to take showers for 2-3 weeks.  Keep your dressing dry until your health care provider says it can be removed.  Try to have someone at home with you for the first 1-2 weeks to help with your daily chores.  Wear compression stockings as told by your health care provider. These stockings help to prevent blood clots and reduce swelling in your legs.  Keep all follow-up visits. This is important.   Contact a health care provider if:  You have any of these signs of infection: ? Chills or a fever. ? More redness, swelling, or pain around an incision. ? Fluid or blood coming from an incision. ? Warmth coming from an incision. ? Pus or a bad smell coming from an incision.  An incision opens.  You feel dizzy or light-headed.  You have pain or bleeding when you urinate, or you are unable to urinate.  You have abnormal vaginal discharge.  You have pain that does not get better with medicine. Get help right away if:  You have a fever and your symptoms suddenly get worse.  You have severe abdominal pain.  You have chest pain or shortness of breath.  You faint.  You have pain, swelling, or redness in your leg.  You have heavy vaginal bleeding with blood clots, soaking through a sanitary pad in less than 1 hour. These symptoms may represent a serious problem that is an emergency. Do not wait to see if the symptoms will go away. Get medical help right away. Call your local emergency services (911 in the [place] Do not drive yourself to the hospital. Summary  After the procedure, it is common to have pain and bruising around your incisions.  Do not take baths, swim, or use a hot tub until your health care provider approves.  Do not lift anything that is heavier than 10 lb (4.5 kg), or the limit that you are told, for one month after surgery or until your health care  provider says that it is safe.  Tell your health care provider if you have any signs or symptoms of infection after the procedure.  Get help right away if you have severe abdominal pain, chest pain, shortness of breath, or heavy bleeding from your vagina. This information is not intended to replace advice given to you by your health care provider. Make sure you discuss any questions you have with your health care provider. Document Revised: 11/03/2019 Document Reviewed: 11/03/2019 Elsevier Patient Education  [place].

## 2020-05-06 NOTE — Progress Notes (Signed)
PRE-OPERATIVE HISTORY AND PHYSICAL EXAM  HPI:  Desiree Walker is a 60 y.o. [id number] No LMP recorded. Patient is postmenopausal.; she is being admitted for surgery related to fibroids and pelvic pain.  Pt has recent dx of fibroids, growing, w relationship to RLQ pain, concern for future worsening of sx's, and future worry over FH of ovarian cancer.  PMHx: Past Medical History:  Diagnosis Date  . Anemia    H/O  . Anxiety   . BRCA negative 08/2018   MyRisk neg except NTHL1 VUS  . Complication of anesthesia   . Family history of breast cancer 08/2018   IBIS=8.65/riskscore=7.9%  . Family history of ovarian cancer    MyRisk neg except NTHL1 VUS  . GERD (gastroesophageal reflux disease)    NO MEDS  . Hyperlipidemia   . Hypertension   . Hypothyroidism    Hashimoto's per patient  . IFG (impaired fasting glucose) 08/05/2015  . Metabolic syndrome 2/97/9892  . Palpitations 2012   Evaluated by Ida Rogue, MD, Holter moniter  . PONV (postoperative nausea and vomiting)   . Rectal mass   . Thyroid disease    unspecified hypothyroidism   Past Surgical History:  Procedure Laterality Date  . ANAL FISTULOTOMY N/A 11/30/2014   Procedure: ANAL FISTULOTOMY;  Surgeon: Dia Crawford III, MD;  Location: [facility] ORS;  Service: General;  Laterality: N/A;  . BREAST BIOPSY Right 09/08/2018   affirm bx of mass, coil clip, ORGANIZING FAT NECROSIS AND  . CESAREAN SECTION    . COLONOSCOPY  2014  . DILATION AND CURETTAGE OF UTERUS    . ENDOMETRIAL ABLATION W/ NOVASURE  05/29/2009   Dr Kenton Kingfisher  . left foot surgery    . RECTAL EXAM UNDER ANESTHESIA N/A 11/30/2014   Procedure: RECTAL EXAM UNDER ANESTHESIA;  Surgeon: Dia Crawford III, MD;  Location: [facility] ORS;  Service: General;  Laterality: N/A;   Family History  Problem Relation Age of Onset  . Hypertension Mother   . Heart disease Mother 54       "blockages"  . Alzheimer's disease Mother   . Diverticulitis Father   . Hernia Father   . Depression Father    . Alzheimer's disease Father   . Hypothyroidism Sister   . Uterine cancer Sister 38  . Ovarian cancer Sister   . Breast cancer Maternal Aunt 26  . Hypothyroidism Daughter   . Hypertension Brother   . Stroke Maternal Grandfather   . Heart attack Maternal Grandfather   . Stroke Paternal Grandmother   . Hypothyroidism Sister   . Ovarian cancer Sister   . Hypothyroidism Daughter   . Breast cancer Paternal Aunt 58  . Breast cancer Other 30   Social History   Tobacco Use  . Smoking status: Never Smoker  . Smokeless tobacco: Never Used  Vaping Use  . Vaping Use: Never used  Substance Use Topics  . Alcohol use: No    Alcohol/week: 0.0 standard drinks  . Drug use: No    Current Outpatient Medications:  .  cephALEXin (KEFLEX) 500 MG capsule, Take 1 capsule (500 mg total) by mouth 4 (four) times daily., Disp: 28 capsule, Rfl: 2 .  DUPIXENT 300 MG/2ML SOPN, Inject 300 mg into the skin every 14 (fourteen) days., Disp: , Rfl:  .  gabapentin (NEURONTIN) 100 MG capsule, Take 1 capsule (100 mg total) by mouth at bedtime., Disp: 60 capsule, Rfl: 1 .  levothyroxine (SYNTHROID) 112 MCG tablet, TAKE ONE TABLET DAILY. EVERY SUNDAY  TAKE AN EXTRA ONE-HALF TABLET. (Patient taking differently: Take 112-168 mcg by mouth See admin instructions. Take 1.5 tablets (168 mcg) by mouth on Sundays & take 1 tablet (112 mcg) by mouth on all other days.), Disp: 90 tablet, Rfl: 3 .  losartan (COZAAR) 50 MG tablet, Take 1.5 tablets (75 mg total) by mouth daily., Disp: 135 tablet, Rfl: 1 .  meloxicam (MOBIC) 15 MG tablet, Take 1 tablet (15 mg total) by mouth daily as needed., Disp: 60 tablet, Rfl: 1 .  montelukast (SINGULAIR) 10 MG tablet, Take 1 tablet (10 mg total) by mouth at bedtime., Disp: 30 tablet, Rfl: 3 Allergies: Scopolamine  Review of Systems  Constitutional: Negative for chills, fever and malaise/fatigue.  HENT: Negative for congestion, sinus pain and sore throat.   Eyes: Negative for blurred vision  and pain.  Respiratory: Negative for cough and wheezing.   Cardiovascular: Negative for chest pain and leg swelling.  Gastrointestinal: Negative for abdominal pain, constipation, diarrhea, heartburn, nausea and vomiting.  Genitourinary: Negative for dysuria, frequency, hematuria and urgency.  Musculoskeletal: Negative for back pain, joint pain, myalgias and neck pain.  Skin: Negative for itching and rash.  Neurological: Negative for dizziness, tremors and weakness.  Endo/Heme/Allergies: Does not bruise/bleed easily.  Psychiatric/Behavioral: Negative for depression. The patient is not nervous/anxious and does not have insomnia.     Objective: BP 140/90   Ht 5' 7.5" (1.715 m)   Wt 220 lb (99.8 kg)   BMI 33.95 kg/m   Filed Weights   05/06/20 1110  Weight: 220 lb (99.8 kg)   Physical Exam Constitutional:      General: She is not in acute distress.    Appearance: She is well-developed.  HENT:     Head: Normocephalic and atraumatic. No laceration.     Right Ear: Hearing normal.     Left Ear: Hearing normal.     Mouth/Throat:     Pharynx: Uvula midline.  Eyes:     Pupils: Pupils are equal, round, and reactive to light.  Neck:     Thyroid: No thyromegaly.  Cardiovascular:     Rate and Rhythm: Normal rate and regular rhythm.     Heart sounds: No murmur heard. No friction rub. No gallop.   Pulmonary:     Effort: Pulmonary effort is normal. No respiratory distress.     Breath sounds: Normal breath sounds. No wheezing.  Abdominal:     General: Bowel sounds are normal. There is no distension.     Palpations: Abdomen is soft.     Tenderness: There is no abdominal tenderness. There is no rebound.  Musculoskeletal:        General: Normal range of motion.     Cervical back: Normal range of motion and neck supple.  Neurological:     Mental Status: She is alert and oriented to person, place, and time.     Cranial Nerves: No cranial nerve deficit.  Skin:    General: Skin is warm and  dry.  Psychiatric:        Judgment: Judgment normal.  Vitals reviewed.     Assessment: 1. Intramural leiomyoma of uterus   2. RLQ abdominal pain   3. Family history of ovarian cancer   Plan TLH BSO  I have had a careful discussion with this patient about all the options available and the risk/benefits of each. I have fully informed this patient that surgery may subject her to a variety of discomforts and risks: She understands that most  patients have surgery with little difficulty, but problems can happen ranging from minor to fatal. These include nausea, vomiting, pain, bleeding, infection, poor healing, hernia, or formation of adhesions. Unexpected reactions may occur from any drug or anesthetic given. Unintended injury may occur to other pelvic or abdominal structures such as Fallopian tubes, ovaries, bladder, ureter (tube from kidney to bladder), or bowel. Nerves going from the pelvis to the legs may be injured. Any such injury may require immediate or later additional surgery to correct the problem. Excessive blood loss requiring transfusion is very unlikely but possible. Dangerous blood clots may form in the legs or lungs. Physical and sexual activity will be restricted in varying degrees for an indeterminate period of time but most often 2-6 weeks.  Finally, she understands that it is impossible to list every possible undesirable effect and that the condition for which surgery is done is not always cured or significantly improved, and in rare cases may be even worse.Ample time was given to answer all questions.  Barnett Applebaum, MD, [facility], [facility] 05/06/2020  11:31 AM

## 2020-05-06 NOTE — H&P (View-Only) (Signed)
   PRE-OPERATIVE HISTORY AND PHYSICAL EXAM  HPI:  Desiree Walker is a 60 y.o. G4P3013 No LMP recorded. Patient is postmenopausal.; she is being admitted for surgery related to fibroids and pelvic pain.  Pt has recent dx of fibroids, growing, w relationship to RLQ pain, concern for future worsening of sx's, and future worry over FH of ovarian cancer.  PMHx: Past Medical History:  Diagnosis Date  . Anemia    H/O  . Anxiety   . BRCA negative 08/2018   MyRisk neg except NTHL1 VUS  . Complication of anesthesia   . Family history of breast cancer 08/2018   IBIS=8.65/riskscore=7.9%  . Family history of ovarian cancer    MyRisk neg except NTHL1 VUS  . GERD (gastroesophageal reflux disease)    NO MEDS  . Hyperlipidemia   . Hypertension   . Hypothyroidism    Hashimoto's per patient  . IFG (impaired fasting glucose) 08/05/2015  . Metabolic syndrome 10/13/2016  . Palpitations 2012   Evaluated by Timothy Gollan, MD, Holter moniter  . PONV (postoperative nausea and vomiting)   . Rectal mass   . Thyroid disease    unspecified hypothyroidism   Past Surgical History:  Procedure Laterality Date  . ANAL FISTULOTOMY N/A 11/30/2014   Procedure: ANAL FISTULOTOMY;  Surgeon: Ralph Ely III, MD;  Location: [facility] ORS;  Service: General;  Laterality: N/A;  . BREAST BIOPSY Right 09/08/2018   affirm bx of mass, coil clip, ORGANIZING FAT NECROSIS AND  . CESAREAN SECTION    . COLONOSCOPY  2014  . DILATION AND CURETTAGE OF UTERUS    . ENDOMETRIAL ABLATION W/ NOVASURE  05/29/2009   Dr Miral Hoopes  . left foot surgery    . RECTAL EXAM UNDER ANESTHESIA N/A 11/30/2014   Procedure: RECTAL EXAM UNDER ANESTHESIA;  Surgeon: Ralph Ely III, MD;  Location: [facility] ORS;  Service: General;  Laterality: N/A;   Family History  Problem Relation Age of Onset  . Hypertension Mother   . Heart disease Mother 70       "blockages"  . Alzheimer's disease Mother   . Diverticulitis Father   . Hernia Father   . Depression Father    . Alzheimer's disease Father   . Hypothyroidism Sister   . Uterine cancer Sister 64  . Ovarian cancer Sister   . Breast cancer Maternal Aunt 43  . Hypothyroidism Daughter   . Hypertension Brother   . Stroke Maternal Grandfather   . Heart attack Maternal Grandfather   . Stroke Paternal Grandmother   . Hypothyroidism Sister   . Ovarian cancer Sister   . Hypothyroidism Daughter   . Breast cancer Paternal Aunt 50  . Breast cancer Other 30   Social History   Tobacco Use  . Smoking status: Never Smoker  . Smokeless tobacco: Never Used  Vaping Use  . Vaping Use: Never used  Substance Use Topics  . Alcohol use: No    Alcohol/week: 0.0 standard drinks  . Drug use: No    Current Outpatient Medications:  .  cephALEXin (KEFLEX) 500 MG capsule, Take 1 capsule (500 mg total) by mouth 4 (four) times daily., Disp: 28 capsule, Rfl: 2 .  DUPIXENT 300 MG/2ML SOPN, Inject 300 mg into the skin every 14 (fourteen) days., Disp: , Rfl:  .  gabapentin (NEURONTIN) 100 MG capsule, Take 1 capsule (100 mg total) by mouth at bedtime., Disp: 60 capsule, Rfl: 1 .  levothyroxine (SYNTHROID) 112 MCG tablet, TAKE ONE TABLET DAILY. EVERY SUNDAY   TAKE AN EXTRA ONE-HALF TABLET. (Patient taking differently: Take 112-168 mcg by mouth See admin instructions. Take 1.5 tablets (168 mcg) by mouth on Sundays & take 1 tablet (112 mcg) by mouth on all other days.), Disp: 90 tablet, Rfl: 3 .  losartan (COZAAR) 50 MG tablet, Take 1.5 tablets (75 mg total) by mouth daily., Disp: 135 tablet, Rfl: 1 .  meloxicam (MOBIC) 15 MG tablet, Take 1 tablet (15 mg total) by mouth daily as needed., Disp: 60 tablet, Rfl: 1 .  montelukast (SINGULAIR) 10 MG tablet, Take 1 tablet (10 mg total) by mouth at bedtime., Disp: 30 tablet, Rfl: 3 Allergies: Scopolamine  Review of Systems  Constitutional: Negative for chills, fever and malaise/fatigue.  HENT: Negative for congestion, sinus pain and sore throat.   Eyes: Negative for blurred vision  and pain.  Respiratory: Negative for cough and wheezing.   Cardiovascular: Negative for chest pain and leg swelling.  Gastrointestinal: Negative for abdominal pain, constipation, diarrhea, heartburn, nausea and vomiting.  Genitourinary: Negative for dysuria, frequency, hematuria and urgency.  Musculoskeletal: Negative for back pain, joint pain, myalgias and neck pain.  Skin: Negative for itching and rash.  Neurological: Negative for dizziness, tremors and weakness.  Endo/Heme/Allergies: Does not bruise/bleed easily.  Psychiatric/Behavioral: Negative for depression. The patient is not nervous/anxious and does not have insomnia.     Objective: BP 140/90   Ht 5' 7.5" (1.715 m)   Wt 220 lb (99.8 kg)   BMI 33.95 kg/m   Filed Weights   05/06/20 1110  Weight: 220 lb (99.8 kg)   Physical Exam Constitutional:      General: She is not in acute distress.    Appearance: She is well-developed.  HENT:     Head: Normocephalic and atraumatic. No laceration.     Right Ear: Hearing normal.     Left Ear: Hearing normal.     Mouth/Throat:     Pharynx: Uvula midline.  Eyes:     Pupils: Pupils are equal, round, and reactive to light.  Neck:     Thyroid: No thyromegaly.  Cardiovascular:     Rate and Rhythm: Normal rate and regular rhythm.     Heart sounds: No murmur heard. No friction rub. No gallop.   Pulmonary:     Effort: Pulmonary effort is normal. No respiratory distress.     Breath sounds: Normal breath sounds. No wheezing.  Abdominal:     General: Bowel sounds are normal. There is no distension.     Palpations: Abdomen is soft.     Tenderness: There is no abdominal tenderness. There is no rebound.  Musculoskeletal:        General: Normal range of motion.     Cervical back: Normal range of motion and neck supple.  Neurological:     Mental Status: She is alert and oriented to person, place, and time.     Cranial Nerves: No cranial nerve deficit.  Skin:    General: Skin is warm and  dry.  Psychiatric:        Judgment: Judgment normal.  Vitals reviewed.     Assessment: 1. Intramural leiomyoma of uterus   2. RLQ abdominal pain   3. Family history of ovarian cancer   Plan TLH BSO  I have had a careful discussion with this patient about all the options available and the risk/benefits of each. I have fully informed this patient that surgery may subject her to a variety of discomforts and risks: She understands that most   patients have surgery with little difficulty, but problems can happen ranging from minor to fatal. These include nausea, vomiting, pain, bleeding, infection, poor healing, hernia, or formation of adhesions. Unexpected reactions may occur from any drug or anesthetic given. Unintended injury may occur to other pelvic or abdominal structures such as Fallopian tubes, ovaries, bladder, ureter (tube from kidney to bladder), or bowel. Nerves going from the pelvis to the legs may be injured. Any such injury may require immediate or later additional surgery to correct the problem. Excessive blood loss requiring transfusion is very unlikely but possible. Dangerous blood clots may form in the legs or lungs. Physical and sexual activity will be restricted in varying degrees for an indeterminate period of time but most often 2-6 weeks.  Finally, she understands that it is impossible to list every possible undesirable effect and that the condition for which surgery is done is not always cured or significantly improved, and in rare cases may be even worse.Ample time was given to answer all questions.  Paul Avalynn Bowe, MD, FACOG [facility], [facility] 05/06/2020  11:31 AM  

## 2020-05-07 ENCOUNTER — Other Ambulatory Visit
Admission: RE | Admit: 2020-05-07 | Discharge: 2020-05-07 | Disposition: A | Discharge status: Home / Self Care | Payer: BC Managed Care – PPO | Source: Ambulatory Visit | Attending: Obstetrics & Gynecology | Admitting: Obstetrics & Gynecology

## 2020-05-07 HISTORY — DX: Prediabetes: R73.03

## 2020-05-07 HISTORY — DX: Unspecified osteoarthritis, unspecified site: M19.90

## 2020-05-07 NOTE — Addendum Note (Signed)
Addended by: Gae Dry on: 05/07/2020 09:09 AM   Modules accepted: Orders, SmartSet

## 2020-05-07 NOTE — Addendum Note (Signed)
Addended by: Gae Dry on: 05/07/2020 03:08 PM   Modules accepted: Miquel Dunn

## 2020-05-07 NOTE — Patient Instructions (Signed)
Your procedure is scheduled on: Tuesday May 14, 2020. Report to Day Surgery inside [facility] 2nd floor (stop by Admissions desk first before getting on Elevator). To find out your arrival time please call [phone] between 1PM - 3PM on Monday May 13, 2020.  Remember: Instructions that are not followed completely may result in serious medical risk,  up to and including death, or upon the discretion of your surgeon and anesthesiologist your  surgery may need to be rescheduled.     _X__ 1. Do not eat food after midnight the night before your procedure.                 No chewing gum or hard candies. You may drink clear liquids up to 2 hours                 before you are scheduled to arrive for your surgery- DO not drink clear                 liquids within 2 hours of the start of your surgery.                 Clear Liquids include:  water, apple juice without pulp, clear Gatorade, G2 or                  Gatorade Zero (avoid Red/Purple/Blue), Black Coffee or Tea (Do not add                 anything to coffee or tea).  __X__2.  On the morning of surgery brush your teeth with toothpaste and water, you                may rinse your mouth with mouthwash if you wish.  Do not swallow any toothpaste of mouthwash.     _X__ 3.  No Alcohol for 24 hours before or after surgery.   _X__ 4.  Do Not Smoke or use e-cigarettes For 24 Hours Prior to Your Surgery.                 Do not use any chewable tobacco products for at least 6 hours prior to                 Surgery.  _X__  5.  Do not use any recreational drugs (marijuana, cocaine, heroin, ecstasy, MDMA or other)                For at least one week prior to your surgery.  Combination of these drugs with anesthesia                May have life threatening results.  __X__ 6.  Notify your doctor if there is any change in your medical condition      (cold, fever, infections).     Do not wear jewelry, make-up,  hairpins, clips or nail polish. Do not wear lotions, powders, or perfumes. You may wear deodorant. Do not shave 48 hours prior to surgery. Men may shave face and neck. Do not bring valuables to the hospital.    [facility] is not responsible for any belongings or valuables.  Contacts, dentures or bridgework may not be worn into surgery. Leave your suitcase in the car. After surgery it may be brought to your room. For patients admitted to the hospital, discharge time is determined by your treatment team.   Patients discharged the day of surgery will not be allowed to drive  home.   Make arrangements for someone to be with you for the first 24 hours of your Same Day Discharge.   __X__ Take these medicines the morning of surgery with A SIP OF WATER:    1. levothyroxine (SYNTHROID) 112 MCG  2. acetaminophen (TYLENOL) 500 MG (if needed)  ____ Fleet Enema (as directed)   ____ Use CHG Soap (or wipes) as directed  ____ Use Benzoyl Peroxide Gel as instructed  ____ Use inhalers on the day of surgery  ____ Stop metformin 2 days prior to surgery    ____ Take 1/2 of usual insulin dose the night before surgery. No insulin the morning          of surgery.   __X__ Stop Anti-inflammatories such as meloxicam (MOBIC), Ibuprofen, Aleve, Advil, naproxen, aspirin and or BC powders.   __X__ Stop supplements until after surgery.    __X__ Do not start any herbal supplements before your procedure.     If you have any questions regarding your pre-procedure instructions,  Please call Pre-admit Testing at [phone].

## 2020-05-08 ENCOUNTER — Other Ambulatory Visit: Payer: Self-pay

## 2020-05-08 ENCOUNTER — Other Ambulatory Visit
Admission: RE | Admit: 2020-05-08 | Discharge: 2020-05-08 | Disposition: A | Discharge status: Home / Self Care | Payer: BC Managed Care – PPO | Source: Ambulatory Visit | Attending: Obstetrics & Gynecology | Admitting: Obstetrics & Gynecology

## 2020-05-08 DIAGNOSIS — Z01810 Encounter for preprocedural cardiovascular examination: Secondary | ICD-10-CM | POA: Diagnosis not present

## 2020-05-08 DIAGNOSIS — Z01818 Encounter for other preprocedural examination: Secondary | ICD-10-CM | POA: Diagnosis not present

## 2020-05-08 DIAGNOSIS — I10 Essential (primary) hypertension: Secondary | ICD-10-CM | POA: Insufficient documentation

## 2020-05-08 LAB — URINALYSIS, ROUTINE W REFLEX MICROSCOPIC
Bilirubin Urine: NEGATIVE
Glucose, UA: NEGATIVE mg/dL
Hgb urine dipstick: NEGATIVE
Ketones, ur: NEGATIVE mg/dL
Nitrite: NEGATIVE
Protein, ur: NEGATIVE mg/dL
Specific Gravity, Urine: 1.017 (ref 1.005–1.030)
pH: 5 (ref 5.0–8.0)

## 2020-05-08 LAB — TYPE AND SCREEN
ABO/RH(D): O POS
Antibody Screen: NEGATIVE

## 2020-05-08 LAB — CBC
HCT: 39 % (ref 36.0–46.0)
Hemoglobin: 13.1 g/dL (ref 12.0–15.0)
MCH: 27.5 pg (ref 26.0–34.0)
MCHC: 33.6 g/dL (ref 30.0–36.0)
MCV: 81.9 fL (ref 80.0–100.0)
Platelets: 226 10*3/uL (ref 150–400)
RBC: 4.76 MIL/uL (ref 3.87–5.11)
RDW: 13.4 % (ref 11.5–15.5)
WBC: 4.9 10*3/uL (ref 4.0–10.5)
nRBC: 0 % (ref 0.0–0.2)

## 2020-05-09 ENCOUNTER — Telehealth: Payer: Self-pay

## 2020-05-09 ENCOUNTER — Other Ambulatory Visit: Payer: Self-pay | Admitting: Obstetrics & Gynecology

## 2020-05-09 DIAGNOSIS — R300 Dysuria: Secondary | ICD-10-CM

## 2020-05-09 MED ORDER — ALPRAZOLAM 0.25 MG PO TABS: 0.2500 mg | ORAL_TABLET | Freq: Two times a day (BID) | ORAL | 0 refills | Status: DC | PRN

## 2020-05-09 MED ORDER — CEPHALEXIN 500 MG PO CAPS: 500.0000 mg | ORAL_CAPSULE | Freq: Four times a day (QID) | ORAL | 2 refills | Status: DC

## 2020-05-09 NOTE — Telephone Encounter (Signed)
Pt calling triage reporting that she got a message from Dr Kenton Kingfisher stating he had results back from Urine Culture.. Does she need a rx?

## 2020-05-09 NOTE — Telephone Encounter (Signed)
Let her know I will call in antibiotic refill for her UTI, although results revealed mild form of UTI. Also will call in medicine for anxiety for the week leading up to surgery.

## 2020-05-09 NOTE — Telephone Encounter (Signed)
Pt aware.

## 2020-05-10 ENCOUNTER — Other Ambulatory Visit: Payer: Self-pay

## 2020-05-10 ENCOUNTER — Other Ambulatory Visit
Admission: RE | Admit: 2020-05-10 | Discharge: 2020-05-10 | Disposition: A | Discharge status: Home / Self Care | Payer: BC Managed Care – PPO | Source: Ambulatory Visit | Attending: Obstetrics & Gynecology | Admitting: Obstetrics & Gynecology

## 2020-05-10 ENCOUNTER — Telehealth: Payer: Self-pay

## 2020-05-10 DIAGNOSIS — Z01812 Encounter for preprocedural laboratory examination: Secondary | ICD-10-CM | POA: Insufficient documentation

## 2020-05-10 DIAGNOSIS — Z20822 Contact with and (suspected) exposure to covid-19: Secondary | ICD-10-CM | POA: Insufficient documentation

## 2020-05-10 LAB — SARS CORONAVIRUS 2 (TAT 6-24 HRS): SARS Coronavirus 2: NEGATIVE

## 2020-05-10 NOTE — Telephone Encounter (Signed)
Pt calling; had covid test today; was told to quarantine for four days; is scheduled to work today; what to do?  [phone]  Pt wanted to know if the was strongly suggested or really needed to be done.  Adv it really needs to be done.  Pt wasn't aware of this and wishes she had knows sooner.

## 2020-05-13 MED ORDER — FAMOTIDINE 20 MG PO TABS: 20.0000 mg | ORAL_TABLET | Freq: Once | ORAL | Status: AC

## 2020-05-13 MED ORDER — LACTATED RINGERS IV SOLN: INTRAVENOUS | Status: DC

## 2020-05-13 MED ORDER — POVIDONE-IODINE 10 % EX SWAB: 2.0000 "application " | Freq: Once | CUTANEOUS | Status: DC

## 2020-05-13 MED ORDER — ORAL CARE MOUTH RINSE: 15.0000 mL | Freq: Once | OROMUCOSAL | Status: AC

## 2020-05-13 MED ORDER — CHLORHEXIDINE GLUCONATE 0.12 % MT SOLN: 15.0000 mL | Freq: Once | OROMUCOSAL | Status: AC

## 2020-05-13 MED ORDER — SODIUM CHLORIDE 0.9 % IV SOLN
2.0000 g | INTRAVENOUS | Status: AC
  Administered 2020-05-14: 2 g via INTRAVENOUS

## 2020-05-14 ENCOUNTER — Ambulatory Visit
Admission: RE | Admit: 2020-05-14 | Discharge: 2020-05-14 | Disposition: A | Discharge status: Home / Self Care | Payer: BC Managed Care – PPO | Attending: Obstetrics & Gynecology | Admitting: Obstetrics & Gynecology

## 2020-05-14 ENCOUNTER — Encounter: Payer: Self-pay | Admitting: Obstetrics & Gynecology

## 2020-05-14 ENCOUNTER — Ambulatory Visit: Payer: BC Managed Care – PPO | Admitting: Registered Nurse

## 2020-05-14 ENCOUNTER — Encounter
Admission: RE | Disposition: A | Discharge status: Home / Self Care | Payer: Self-pay | Source: Home / Self Care | Attending: Obstetrics & Gynecology

## 2020-05-14 ENCOUNTER — Other Ambulatory Visit: Payer: Self-pay

## 2020-05-14 DIAGNOSIS — Z803 Family history of malignant neoplasm of breast: Secondary | ICD-10-CM | POA: Diagnosis not present

## 2020-05-14 DIAGNOSIS — K219 Gastro-esophageal reflux disease without esophagitis: Secondary | ICD-10-CM | POA: Diagnosis not present

## 2020-05-14 DIAGNOSIS — Z79890 Hormone replacement therapy (postmenopausal): Secondary | ICD-10-CM | POA: Diagnosis not present

## 2020-05-14 DIAGNOSIS — D259 Leiomyoma of uterus, unspecified: Secondary | ICD-10-CM | POA: Diagnosis present

## 2020-05-14 DIAGNOSIS — E785 Hyperlipidemia, unspecified: Secondary | ICD-10-CM | POA: Insufficient documentation

## 2020-05-14 DIAGNOSIS — D251 Intramural leiomyoma of uterus: Secondary | ICD-10-CM | POA: Insufficient documentation

## 2020-05-14 DIAGNOSIS — R1031 Right lower quadrant pain: Secondary | ICD-10-CM | POA: Diagnosis not present

## 2020-05-14 DIAGNOSIS — I10 Essential (primary) hypertension: Secondary | ICD-10-CM | POA: Insufficient documentation

## 2020-05-14 DIAGNOSIS — Z8379 Family history of other diseases of the digestive system: Secondary | ICD-10-CM | POA: Insufficient documentation

## 2020-05-14 DIAGNOSIS — Z808 Family history of malignant neoplasm of other organs or systems: Secondary | ICD-10-CM | POA: Diagnosis not present

## 2020-05-14 DIAGNOSIS — E063 Autoimmune thyroiditis: Secondary | ICD-10-CM | POA: Insufficient documentation

## 2020-05-14 DIAGNOSIS — Z79899 Other long term (current) drug therapy: Secondary | ICD-10-CM | POA: Insufficient documentation

## 2020-05-14 DIAGNOSIS — Z8249 Family history of ischemic heart disease and other diseases of the circulatory system: Secondary | ICD-10-CM | POA: Insufficient documentation

## 2020-05-14 DIAGNOSIS — Z8041 Family history of malignant neoplasm of ovary: Secondary | ICD-10-CM | POA: Diagnosis not present

## 2020-05-14 HISTORY — PX: CYSTOSCOPY: SHX5120

## 2020-05-14 HISTORY — PX: TOTAL LAPAROSCOPIC HYSTERECTOMY WITH BILATERAL SALPINGO OOPHORECTOMY: SHX6845

## 2020-05-14 LAB — ABO/RH: ABO/RH(D): O POS

## 2020-05-14 SURGERY — HYSTERECTOMY, TOTAL, LAPAROSCOPIC, WITH BILATERAL SALPINGO-OOPHORECTOMY
Anesthesia: General

## 2020-05-14 MED ORDER — SUGAMMADEX SODIUM 200 MG/2ML IV SOLN
INTRAVENOUS | Status: DC | PRN
  Administered 2020-05-14: 200 mg via INTRAVENOUS

## 2020-05-14 MED ORDER — ONDANSETRON HCL 4 MG/2ML IJ SOLN
INTRAMUSCULAR | Status: DC | PRN
  Administered 2020-05-14: 4 mg via INTRAVENOUS

## 2020-05-14 MED ORDER — BUPIVACAINE HCL (PF) 0.5 % IJ SOLN
INTRAMUSCULAR | Status: AC
  Filled 2020-05-14: qty 30

## 2020-05-14 MED ORDER — HYDROCODONE-ACETAMINOPHEN 5-325 MG PO TABS
1.0000 | ORAL_TABLET | Freq: Once | ORAL | Status: AC
  Administered 2020-05-14: 1 via ORAL

## 2020-05-14 MED ORDER — PROPOFOL 10 MG/ML IV BOLUS
INTRAVENOUS | Status: AC
  Filled 2020-05-14: qty 20

## 2020-05-14 MED ORDER — METHYLENE BLUE 0.5 % INJ SOLN
INTRAVENOUS | Status: AC
  Filled 2020-05-14: qty 10

## 2020-05-14 MED ORDER — FAMOTIDINE 20 MG PO TABS
ORAL_TABLET | ORAL | Status: AC
  Administered 2020-05-14: 20 mg via ORAL
  Filled 2020-05-14: qty 1

## 2020-05-14 MED ORDER — GLYCOPYRROLATE 0.2 MG/ML IJ SOLN
INTRAMUSCULAR | Status: AC
  Filled 2020-05-14: qty 1

## 2020-05-14 MED ORDER — MICROFIBRILLAR COLL HEMOSTAT EX POWD
CUTANEOUS | Status: DC | PRN
  Administered 2020-05-14: 3 g via TOPICAL

## 2020-05-14 MED ORDER — SUCCINYLCHOLINE CHLORIDE 20 MG/ML IJ SOLN
INTRAMUSCULAR | Status: DC | PRN
  Administered 2020-05-14: 120 mg via INTRAVENOUS

## 2020-05-14 MED ORDER — METOPROLOL TARTRATE 5 MG/5ML IV SOLN
INTRAVENOUS | Status: DC | PRN
  Administered 2020-05-14 (×2): 1 mg via INTRAVENOUS

## 2020-05-14 MED ORDER — ACETAMINOPHEN 325 MG PO TABS: 650.0000 mg | ORAL_TABLET | ORAL | Status: DC | PRN

## 2020-05-14 MED ORDER — KETAMINE HCL 10 MG/ML IJ SOLN
INTRAMUSCULAR | Status: DC | PRN
  Administered 2020-05-14: 30 mg via INTRAVENOUS
  Administered 2020-05-14 (×3): 10 mg via INTRAVENOUS
  Administered 2020-05-14: 20 mg via INTRAVENOUS

## 2020-05-14 MED ORDER — MIDAZOLAM HCL 2 MG/2ML IJ SOLN: 1.0000 mg | Freq: Once | INTRAMUSCULAR | Status: AC

## 2020-05-14 MED ORDER — BUPIVACAINE HCL (PF) 0.5 % IJ SOLN
INTRAMUSCULAR | Status: DC | PRN
  Administered 2020-05-14: 16 mL

## 2020-05-14 MED ORDER — KETOROLAC TROMETHAMINE 30 MG/ML IJ SOLN
INTRAMUSCULAR | Status: DC | PRN
  Administered 2020-05-14: 30 mg via INTRAVENOUS

## 2020-05-14 MED ORDER — MIDAZOLAM HCL 2 MG/2ML IJ SOLN
INTRAMUSCULAR | Status: AC
  Filled 2020-05-14: qty 2

## 2020-05-14 MED ORDER — FENTANYL CITRATE (PF) 100 MCG/2ML IJ SOLN
25.0000 ug | INTRAMUSCULAR | Status: DC | PRN
Start: 2020-05-14 — End: 2020-05-14
  Administered 2020-05-14 (×2): 25 ug via INTRAVENOUS

## 2020-05-14 MED ORDER — MIDAZOLAM HCL 2 MG/2ML IJ SOLN
INTRAMUSCULAR | Status: AC
  Administered 2020-05-14: 1 mg via INTRAVENOUS
  Filled 2020-05-14: qty 2

## 2020-05-14 MED ORDER — HYDROCODONE-ACETAMINOPHEN 5-325 MG PO TABS
ORAL_TABLET | ORAL | Status: AC
  Filled 2020-05-14: qty 1

## 2020-05-14 MED ORDER — ROCURONIUM BROMIDE 10 MG/ML (PF) SYRINGE
PREFILLED_SYRINGE | INTRAVENOUS | Status: AC
  Filled 2020-05-14: qty 10

## 2020-05-14 MED ORDER — SODIUM CHLORIDE 0.9 % IV SOLN
INTRAVENOUS | Status: AC
  Filled 2020-05-14: qty 2

## 2020-05-14 MED ORDER — LIDOCAINE HCL (PF) 2 % IJ SOLN
INTRAMUSCULAR | Status: AC
  Filled 2020-05-14: qty 5

## 2020-05-14 MED ORDER — KETAMINE HCL 50 MG/5ML IJ SOSY
PREFILLED_SYRINGE | INTRAMUSCULAR | Status: AC
  Filled 2020-05-14: qty 5

## 2020-05-14 MED ORDER — HYDROCODONE-ACETAMINOPHEN 5-325 MG PO TABS: 1.0000 | ORAL_TABLET | ORAL | Status: DC | PRN

## 2020-05-14 MED ORDER — DIPHENHYDRAMINE HCL 50 MG/ML IJ SOLN
INTRAMUSCULAR | Status: DC | PRN
  Administered 2020-05-14: 25 mg via INTRAVENOUS

## 2020-05-14 MED ORDER — PROPOFOL 500 MG/50ML IV EMUL
INTRAVENOUS | Status: AC
  Filled 2020-05-14: qty 50

## 2020-05-14 MED ORDER — MIDAZOLAM HCL 2 MG/2ML IJ SOLN
INTRAMUSCULAR | Status: DC | PRN
  Administered 2020-05-14: 1 mg via INTRAVENOUS

## 2020-05-14 MED ORDER — PROMETHAZINE HCL 25 MG PO TABS: 12.5000 mg | ORAL_TABLET | ORAL | Status: DC | PRN

## 2020-05-14 MED ORDER — CHLORHEXIDINE GLUCONATE 0.12 % MT SOLN
OROMUCOSAL | Status: AC
  Administered 2020-05-14: 15 mL via OROMUCOSAL
  Filled 2020-05-14: qty 15

## 2020-05-14 MED ORDER — METOPROLOL TARTRATE 5 MG/5ML IV SOLN
INTRAVENOUS | Status: AC
  Filled 2020-05-14: qty 5

## 2020-05-14 MED ORDER — ACETAMINOPHEN 10 MG/ML IV SOLN
INTRAVENOUS | Status: DC | PRN
  Administered 2020-05-14: 1000 mg via INTRAVENOUS

## 2020-05-14 MED ORDER — DEXMEDETOMIDINE (PRECEDEX) IN NS 20 MCG/5ML (4 MCG/ML) IV SYRINGE
PREFILLED_SYRINGE | INTRAVENOUS | Status: AC
  Filled 2020-05-14: qty 5

## 2020-05-14 MED ORDER — MORPHINE SULFATE (PF) 2 MG/ML IV SOLN: 1.0000 mg | INTRAVENOUS | Status: DC | PRN

## 2020-05-14 MED ORDER — HYDROCODONE-ACETAMINOPHEN 5-325 MG PO TABS: 1.0000 | ORAL_TABLET | ORAL | 0 refills | Status: DC | PRN

## 2020-05-14 MED ORDER — DEXAMETHASONE SODIUM PHOSPHATE 10 MG/ML IJ SOLN
INTRAMUSCULAR | Status: DC | PRN
  Administered 2020-05-14: 5 mg via INTRAVENOUS

## 2020-05-14 MED ORDER — PROPOFOL 500 MG/50ML IV EMUL
INTRAVENOUS | Status: DC | PRN
Start: 2020-05-14 — End: 2020-05-14
  Administered 2020-05-14: 175 ug/kg/min via INTRAVENOUS

## 2020-05-14 MED ORDER — FENTANYL CITRATE (PF) 100 MCG/2ML IJ SOLN
INTRAMUSCULAR | Status: DC | PRN
  Administered 2020-05-14 (×4): 25 ug via INTRAVENOUS

## 2020-05-14 MED ORDER — PROPOFOL 10 MG/ML IV BOLUS
INTRAVENOUS | Status: DC | PRN
  Administered 2020-05-14: 150 mg via INTRAVENOUS
  Administered 2020-05-14: 20 mg via INTRAVENOUS

## 2020-05-14 MED ORDER — ONDANSETRON HCL 4 MG/2ML IJ SOLN
4.0000 mg | Freq: Once | INTRAMUSCULAR | Status: AC | PRN
  Administered 2020-05-14: 4 mg via INTRAVENOUS

## 2020-05-14 MED ORDER — FENTANYL CITRATE (PF) 100 MCG/2ML IJ SOLN
INTRAMUSCULAR | Status: AC
  Filled 2020-05-14: qty 2

## 2020-05-14 MED ORDER — ACETAMINOPHEN 650 MG RE SUPP
650.0000 mg | RECTAL | Status: DC | PRN
  Filled 2020-05-14: qty 1

## 2020-05-14 MED ORDER — ROCURONIUM BROMIDE 100 MG/10ML IV SOLN
INTRAVENOUS | Status: DC | PRN
  Administered 2020-05-14: 10 mg via INTRAVENOUS
  Administered 2020-05-14 (×2): 20 mg via INTRAVENOUS
  Administered 2020-05-14: 40 mg via INTRAVENOUS
  Administered 2020-05-14: 10 mg via INTRAVENOUS

## 2020-05-14 MED ORDER — KETOROLAC TROMETHAMINE 30 MG/ML IJ SOLN
INTRAMUSCULAR | Status: AC
  Filled 2020-05-14: qty 1

## 2020-05-14 MED ORDER — PROMETHAZINE HCL 12.5 MG PO TABS: 12.5000 mg | ORAL_TABLET | ORAL | 0 refills | Status: DC | PRN

## 2020-05-14 MED ORDER — ONDANSETRON HCL 4 MG/2ML IJ SOLN
INTRAMUSCULAR | Status: AC
  Filled 2020-05-14: qty 2

## 2020-05-14 MED ORDER — FENTANYL CITRATE (PF) 100 MCG/2ML IJ SOLN
INTRAMUSCULAR | Status: AC
  Administered 2020-05-14: 25 ug via INTRAVENOUS
  Filled 2020-05-14: qty 2

## 2020-05-14 MED ORDER — LIDOCAINE HCL (CARDIAC) PF 100 MG/5ML IV SOSY
PREFILLED_SYRINGE | INTRAVENOUS | Status: DC | PRN
  Administered 2020-05-14: 100 mg via INTRAVENOUS

## 2020-05-14 MED ORDER — DIPHENHYDRAMINE HCL 50 MG/ML IJ SOLN
INTRAMUSCULAR | Status: AC
  Filled 2020-05-14: qty 1

## 2020-05-14 MED ORDER — ACETAMINOPHEN 10 MG/ML IV SOLN
INTRAVENOUS | Status: AC
  Filled 2020-05-14: qty 100

## 2020-05-14 MED ORDER — LACTATED RINGERS IV SOLN: INTRAVENOUS | Status: DC

## 2020-05-14 MED ORDER — DEXMEDETOMIDINE HCL 200 MCG/2ML IV SOLN
INTRAVENOUS | Status: DC | PRN
  Administered 2020-05-14 (×2): 8 ug via INTRAVENOUS
  Administered 2020-05-14 (×2): 12 ug via INTRAVENOUS

## 2020-05-14 MED ORDER — PROPOFOL 10 MG/ML IV BOLUS
INTRAVENOUS | Status: AC
  Filled 2020-05-14: qty 40

## 2020-05-14 SURGICAL SUPPLY — 67 items
ADH SKN CLS APL DERMABOND .7 (GAUZE/BANDAGES/DRESSINGS) ×2
APL PRP STRL LF DISP 70% ISPRP (MISCELLANEOUS) ×2
APL SRG 38 LTWT LNG FL B (MISCELLANEOUS) ×2
APPLICATOR ARISTA FLEXITIP XL (MISCELLANEOUS) ×3 IMPLANT
BAG DRN RND TRDRP ANRFLXCHMBR (UROLOGICAL SUPPLIES) ×2
BAG LAPAROSCOPIC 12 15 PORT 16 (BASKET) ×2 IMPLANT
BAG RETRIEVAL 12/15 (BASKET) ×3
BAG URINE DRAIN 2000ML AR STRL (UROLOGICAL SUPPLIES) ×3 IMPLANT
BLADE SURG SZ11 CARB STEEL (BLADE) ×3 IMPLANT
CATH FOLEY 2WAY  5CC 16FR (CATHETERS) ×1
CATH FOLEY 2WAY 5CC 16FR (CATHETERS) ×2
CATH URTH 16FR FL 2W BLN LF (CATHETERS) ×2 IMPLANT
CHLORAPREP W/TINT 26 (MISCELLANEOUS) ×3 IMPLANT
COVER WAND RF STERILE (DRAPES) ×3 IMPLANT
DEFOGGER SCOPE WARMER CLEARIFY (MISCELLANEOUS) ×3 IMPLANT
DERMABOND ADVANCED (GAUZE/BANDAGES/DRESSINGS) ×1
DERMABOND ADVANCED .7 DNX12 (GAUZE/BANDAGES/DRESSINGS) ×2 IMPLANT
DEVICE SUTURE ENDOST 10MM (ENDOMECHANICALS) ×3 IMPLANT
DRSG TEGADERM 2-3/8X2-3/4 SM (GAUZE/BANDAGES/DRESSINGS) ×9 IMPLANT
GAUZE 4X4 16PLY RFD (DISPOSABLE) ×3 IMPLANT
GLOVE INDICATOR 8.0 STRL GRN (GLOVE) ×21 IMPLANT
GLOVE SURG ENC MOIS LTX SZ8 (GLOVE) ×21 IMPLANT
GOWN STRL REUS W/ TWL LRG LVL3 (GOWN DISPOSABLE) ×8 IMPLANT
GOWN STRL REUS W/ TWL XL LVL3 (GOWN DISPOSABLE) ×6 IMPLANT
GOWN STRL REUS W/TWL LRG LVL3 (GOWN DISPOSABLE) ×12
GOWN STRL REUS W/TWL XL LVL3 (GOWN DISPOSABLE) ×9
GRASPER SUT TROCAR 14GX15 (MISCELLANEOUS) ×3 IMPLANT
HEMOSTAT ARISTA ABSORB 3G PWDR (HEMOSTASIS) ×3 IMPLANT
IRRIGATION STRYKERFLOW (MISCELLANEOUS) ×2 IMPLANT
IRRIGATOR STRYKERFLOW (MISCELLANEOUS) ×3
IV LACTATED RINGERS 1000ML (IV SOLUTION) ×6 IMPLANT
KIT PINK PAD W/HEAD ARE REST (MISCELLANEOUS) ×3
KIT PINK PAD W/HEAD ARM REST (MISCELLANEOUS) ×2 IMPLANT
LABEL OR SOLS (LABEL) ×3 IMPLANT
MANIFOLD NEPTUNE II (INSTRUMENTS) ×3 IMPLANT
MANIPULATOR VCARE SML CRV RETR (MISCELLANEOUS) ×3 IMPLANT
MORCELLATOR XCISE  COR (MISCELLANEOUS) ×1
MORCELLATOR XCISE COR (MISCELLANEOUS) ×2 IMPLANT
NEEDLE VERESS 14GA 120MM (NEEDLE) ×3 IMPLANT
NS IRRIG 500ML POUR BTL (IV SOLUTION) ×3 IMPLANT
OCCLUDER COLPOPNEUMO (BALLOONS) ×3 IMPLANT
PACK GYN LAPAROSCOPIC (MISCELLANEOUS) ×3 IMPLANT
PAD OB MATERNITY 4.3X12.25 (PERSONAL CARE ITEMS) ×3 IMPLANT
PAD PREP 24X41 OB/GYN DISP (PERSONAL CARE ITEMS) ×3 IMPLANT
PORT ACCESS TROCAR AIRSEAL 12 (TROCAR) ×2 IMPLANT
PORT ACCESS TROCAR AIRSEAL 5M (TROCAR) ×1
RETRACTOR WOUND ALXS 18CM SML (MISCELLANEOUS) ×2 IMPLANT
RTRCTR WOUND ALEXIS O 18CM SML (MISCELLANEOUS) ×3
SCISSORS METZENBAUM CVD 33 (INSTRUMENTS) ×3 IMPLANT
SET CYSTO W/LG BORE CLAMP LF (SET/KITS/TRAYS/PACK) ×3 IMPLANT
SET TRI-LUMEN FLTR TB AIRSEAL (TUBING) ×3 IMPLANT
SHEARS HARMONIC ACE PLUS 36CM (ENDOMECHANICALS) ×3 IMPLANT
SLEEVE ENDOPATH XCEL 5M (ENDOMECHANICALS) ×3 IMPLANT
SPONGE GAUZE 2X2 8PLY STRL LF (GAUZE/BANDAGES/DRESSINGS) ×6 IMPLANT
SUT ENDO VLOC 180-0-8IN (SUTURE) ×6 IMPLANT
SUT MNCRL 4-0 (SUTURE) ×3
SUT MNCRL 4-0 27XMFL (SUTURE) ×2
SUT VIC AB 0 CT1 36 (SUTURE) ×3 IMPLANT
SUT VIC AB 4-0 FS2 27 (SUTURE) ×3 IMPLANT
SUT VICRYL 0 AB UR-6 (SUTURE) ×6 IMPLANT
SUTURE MNCRL 4-0 27XMF (SUTURE) ×2 IMPLANT
SYR 10ML LL (SYRINGE) ×3 IMPLANT
SYR 50ML LL SCALE MARK (SYRINGE) ×3 IMPLANT
SYR TOOMEY IRRIG 70ML (MISCELLANEOUS) ×3
SYRINGE TOOMEY IRRIG 70ML (MISCELLANEOUS) ×2 IMPLANT
TROCAR PORT AIRSEAL 8X100 (TROCAR) ×3 IMPLANT
TROCAR XCEL NON-BLD 5MMX100MML (ENDOMECHANICALS) ×3 IMPLANT

## 2020-05-14 NOTE — Anesthesia Procedure Notes (Signed)
Procedure Name: Intubation Date/Time: 05/14/2020 10:41 AM Performed by: Lia Foyer, CRNA Pre-anesthesia Checklist: Patient identified, Emergency Drugs available, Suction available and Patient being monitored Patient Re-evaluated:Patient Re-evaluated prior to induction Oxygen Delivery Method: Circle system utilized Preoxygenation: Pre-oxygenation with 100% oxygen Induction Type: IV induction Ventilation: Mask ventilation without difficulty Laryngoscope Size: McGraph and 3 Grade View: Grade I Tube type: Oral Tube size: 7.0 mm Number of attempts: 1 Airway Equipment and Method: Stylet and Video-laryngoscopy Placement Confirmation: ETT inserted through vocal cords under direct vision,  positive ETCO2 and breath sounds checked- equal and bilateral Secured at: 21 cm Tube secured with: Tape Dental Injury: Teeth and Oropharynx as per pre-operative assessment  Difficulty Due To: Difficulty was anticipated, Difficult Airway- due to limited oral opening and Difficult Airway- due to anterior larynx Future Recommendations: Recommend- induction with short-acting agent, and alternative techniques readily available

## 2020-05-14 NOTE — Anesthesia Postprocedure Evaluation (Signed)
Anesthesia Post Note  Patient: Desiree Walker  Procedure(s) Performed: TOTAL LAPAROSCOPIC HYSTERECTOMY WITH BILATERAL SALPINGO OOPHORECTOMY (Bilateral ) CYSTOSCOPY (N/A )  Patient location during evaluation: PACU Anesthesia Type: General Level of consciousness: awake and alert and oriented Pain management: pain level controlled Vital Signs Assessment: post-procedure vital signs reviewed and stable Respiratory status: spontaneous breathing, nonlabored ventilation and respiratory function stable Cardiovascular status: blood pressure returned to baseline and stable Postop Assessment: no signs of nausea or vomiting Anesthetic complications: no   No complications documented.   Last Vitals:  Vitals:   05/14/20 1512 05/14/20 1515  BP:  (!) 141/82  Pulse: 85 87  Resp: 13 12  Temp:    SpO2: 96% 95%    Last Pain:  Vitals:   05/14/20 1515  TempSrc:   PainSc: 3                  Arnoldo Hildreth

## 2020-05-14 NOTE — Progress Notes (Signed)
Pt states pain 7/10. This RN spoke with Dr. Randa Lynn and verbal order received.

## 2020-05-14 NOTE — Anesthesia Preprocedure Evaluation (Signed)
Anesthesia Evaluation  Patient identified by MRN, date of birth, ID band Patient awake    Reviewed: Allergy & Precautions, NPO status , Patient's Chart, lab work & pertinent test results  History of Anesthesia Complications (+) PONV and history of anesthetic complications  Airway Mallampati: III  TM Distance: >3 FB Neck ROM: Full    Dental  (+) Teeth Intact   Pulmonary neg pulmonary ROS, sleep apnea ,           Cardiovascular hypertension, + dysrhythmias (Tachycardia)      Neuro/Psych PSYCHIATRIC DISORDERS Anxiety negative neurological ROS     GI/Hepatic Neg liver ROS, GERD  ,  Endo/Other  diabetesHypothyroidism   Renal/GU negative Renal ROS  Female GU complaint     Musculoskeletal  (+) Arthritis ,   Abdominal   Peds negative pediatric ROS (+)  Hematology  (+) anemia ,   Anesthesia Other Findings Past Medical History: No date: Anemia     Comment:  H/O No date: Anxiety No date: Arthritis 08/2018: BRCA negative     Comment:  MyRisk neg except NTHL1 VUS No date: Complication of anesthesia 08/2018: Family history of breast cancer     Comment:  IBIS=8.65/riskscore=7.9% No date: Family history of ovarian cancer     Comment:  MyRisk neg except NTHL1 VUS No date: GERD (gastroesophageal reflux disease)     Comment:  NO MEDS No date: Hyperlipidemia No date: Hypertension No date: Hypothyroidism     Comment:  Hashimoto's per patient 08/05/2015: IFG (impaired fasting glucose) 8/72/7618: Metabolic syndrome 4859: Palpitations     Comment:  Evaluated by Ida Rogue, MD, Holter moniter No date: PONV (postoperative nausea and vomiting) No date: Pre-diabetes No date: Rectal mass No date: Thyroid disease     Comment:  unspecified hypothyroidism  Reproductive/Obstetrics                             Anesthesia Physical  Anesthesia Plan  ASA: II  Anesthesia Plan: General   Post-op  Pain Management:    Induction: Intravenous  PONV Risk Score and Plan:   Airway Management Planned: Oral ETT  Additional Equipment:   Intra-op Plan:   Post-operative Plan: Extubation in OR  Informed Consent: I have reviewed the patients History and Physical, chart, labs and discussed the procedure including the risks, benefits and alternatives for the proposed anesthesia with the patient or authorized representative who has indicated his/her understanding and acceptance.     Dental advisory given  Plan Discussed with: CRNA and Surgeon  Anesthesia Plan Comments:         Anesthesia Quick Evaluation

## 2020-05-14 NOTE — Transfer of Care (Signed)
Immediate Anesthesia Transfer of Care Note  Patient: Desiree Walker  Procedure(s) Performed: TOTAL LAPAROSCOPIC HYSTERECTOMY WITH BILATERAL SALPINGO OOPHORECTOMY (Bilateral ) CYSTOSCOPY (N/A )  Patient Location: PACU  Anesthesia Type:General  Level of Consciousness: patient cooperative , drowsy  Airway & Oxygen Therapy: Patient Spontanous Breathing and Patient connected to face mask oxygen  Post-op Assessment: Report given to RN and Post -op Vital signs reviewed and stable  Post vital signs: Reviewed and stable  Last Vitals:  Vitals Value Taken Time  BP 153/96 05/14/20 1330  Temp    Pulse 78 05/14/20 1334  Resp 15 05/14/20 1334  SpO2 100 % 05/14/20 1334  Vitals shown include unvalidated device data.  Last Pain:  Vitals:   05/14/20 0848  TempSrc: Temporal  PainSc: 0-No pain         Complications: No complications documented.

## 2020-05-14 NOTE — Discharge Instructions (Signed)
Total Laparoscopic Hysterectomy, Care After The following information offers guidance on how to care for yourself after your procedure. Your health care provider may also give you more specific instructions. If you have problems or questions, contact your health care provider. What can I expect after the procedure? After the procedure, it is common to have:  Pain, bruising, and numbness around your incisions.  Tiredness (fatigue).  Poor appetite.  Less interest in sex.  Vaginal discharge or bleeding. You will need to use a sanitary pad after this procedure.  Feelings of sadness or other emotions. If your ovaries were also removed, it is also common to have symptoms of menopause, such as hot flashes, night sweats, and lack of sleep (insomnia). Follow these instructions at home: Medicines  Take over-the-counter and prescription medicines only as told by your health care provider.  Ask your health care provider if the medicine prescribed to you: ? Requires you to avoid driving or using machinery. ? Can cause constipation. You may need to take these actions to prevent or treat constipation:  Drink enough fluid to keep your urine pale yellow.  Take over-the-counter or prescription medicines.  Eat foods that are high in fiber, such as beans, whole grains, and fresh fruits and vegetables.  Limit foods that are high in fat and processed sugars, such as fried or sweet foods. Incision care  Follow instructions from your health care provider about how to take care of your incisions. Make sure you: ? Wash your hands with soap and water for at least 20 seconds before and after you change your bandage (dressing). If soap and water are not available, use hand sanitizer. ? Change your dressing as told by your health care provider. ? Leave stitches (sutures), skin glue, or adhesive strips in place. These skin closures may need to stay in place for 2 weeks or longer. If adhesive strip edges start  to loosen and curl up, you may trim the loose edges. Do not remove adhesive strips completely unless your health care provider tells you to do that.  Check your incision areas every day for signs of infection. Check for: ? More redness, swelling, or pain. ? Fluid or blood. ? Warmth. ? Pus or a bad smell.   Activity  Rest as told by your health care provider.  Avoid sitting for a long time without moving. Get up to take short walks every 1-2 hours. This is important to improve blood flow and breathing. Ask for help if you feel weak or unsteady.  Return to your normal activities as told by your health care provider. Ask your health care provider what activities are safe for you.  Do not lift anything that is heavier than 10 lb (4.5 kg), or the limit that you are told, for one month after surgery or until your health care provider says that it is safe.  If you were given a sedative during the procedure, it can affect you for several hours. Do not drive or operate machinery until your health care provider says that it is safe.   Lifestyle  Do not use any products that contain nicotine or tobacco. These products include cigarettes, chewing tobacco, and vaping devices, such as e-cigarettes. These can delay healing after surgery. If you need help quitting, ask your health care provider.  Do not drink alcohol until your health care provider approves. General instructions  Do not douche, use tampons, or have sex for at least 6 weeks, or as told by your health  care provider.  If you struggle with physical or emotional changes after your procedure, speak with your health care provider or a therapist.  Do not take baths, swim, or use a hot tub until your health care provider approves. You may only be allowed to take showers for 2-3 weeks.  Keep your dressing dry until your health care provider says it can be removed.  Try to have someone at home with you for the first 1-2 weeks to help with your  daily chores.  Wear compression stockings as told by your health care provider. These stockings help to prevent blood clots and reduce swelling in your legs.  Keep all follow-up visits. This is important.   Contact a health care provider if:  You have any of these signs of infection: ? Chills or a fever. ? More redness, swelling, or pain around an incision. ? Fluid or blood coming from an incision. ? Warmth coming from an incision. ? Pus or a bad smell coming from an incision.  An incision opens.  You feel dizzy or light-headed.  You have pain or bleeding when you urinate, or you are unable to urinate.  You have abnormal vaginal discharge.  You have pain that does not get better with medicine. Get help right away if:  You have a fever and your symptoms suddenly get worse.  You have severe abdominal pain.  You have chest pain or shortness of breath.  You faint.  You have pain, swelling, or redness in your leg.  You have heavy vaginal bleeding with blood clots, soaking through a sanitary pad in less than 1 hour. These symptoms may represent a serious problem that is an emergency. Do not wait to see if the symptoms will go away. Get medical help right away. Call your local emergency services (911 in the [place] Do not drive yourself to the hospital. Summary  After the procedure, it is common to have pain and bruising around your incisions.  Do not take baths, swim, or use a hot tub until your health care provider approves.  Do not lift anything that is heavier than 10 lb (4.5 kg), or the limit that you are told, for one month after surgery or until your health care provider says that it is safe.  Tell your health care provider if you have any signs or symptoms of infection after the procedure.  Get help right away if you have severe abdominal pain, chest pain, shortness of breath, or heavy bleeding from your vagina. This information is not intended to replace advice given  to you by your health care provider. Make sure you discuss any questions you have with your health care provider. Document Revised: 11/03/2019 Document Reviewed: 11/03/2019 Elsevier Patient Education  2021 [place]   1) The drugs that you were given will stay in your system until tomorrow so for the next 24 hours you should not:  A) Drive an automobile B) Make any legal decisions C) Drink any alcoholic beverage   2) You may resume regular meals tomorrow.  Today it is better to start with liquids and gradually work up to solid foods.  You may eat anything you prefer, but it is better to start with liquids, then soup and crackers, and gradually work up to solid foods.   3) Please notify your doctor immediately if you have any unusual bleeding, trouble breathing, redness and pain at the surgery site, drainage, fever, or pain not relieved  by medication.    4) Additional Instructions:   Please contact your physician with any problems or Same Day Surgery at [phone], Monday through Friday 6 am to 4 pm, or [place] [facility] at [facility] number at [phone].

## 2020-05-14 NOTE — Interval H&P Note (Signed)
History and Physical Interval Note:  05/14/2020 10:19 AM  Desiree Walker  has presented today for surgery, with the diagnosis of Intramural leiomyoma of uterus D25.1  RLQ abdominal pain R10.31 Family history of ovarian cancer Z80.41.  The various methods of treatment have been discussed with the patient and family. After consideration of risks, benefits and other options for treatment, the patient has consented to  Procedure(s): TOTAL LAPAROSCOPIC HYSTERECTOMY WITH BILATERAL SALPINGO OOPHORECTOMY (Bilateral) as a surgical intervention.  The patient's history has been reviewed, patient examined, no change in status, stable for surgery.  I have reviewed the patient's chart and labs.  Questions were answered to the patient's satisfaction.     Hoyt Koch

## 2020-05-14 NOTE — Op Note (Addendum)
Operative Report:  PRE-OP DIAGNOSIS: Intramural leiomyoma of uterus D25.1  RLQ abdominal pain R10.31 Family history of ovarian cancer Z80.41   POST-OP DIAGNOSIS: Intramural leiomyoma of uterus D25.1  RLQ abdominal pain R10.31 Family history of ovarian cancer Z80.41   PROCEDURE: Procedure(s): LAPAROSCOPIC SUPRACERVICAL HYSTERECTOMY WITH BILATERAL SALPINGO OOPHORECTOMY CYSTOSCOPY  SURGEON: Barnett Applebaum, MD, FACOG  ASSISTANT: Dr Gilman Schmidt, No other capable assistant available, in surgery requiring high level assistant.  ANESTHESIA: General endotracheal anesthesia  ESTIMATED BLOOD LOSS: less than 100   SPECIMENS: Uterus without cervix, Tubes, Ovaries.  COMPLICATIONS: None  DISPOSITION: stable to PACU  FINDINGS: Intraabdominal adhesions were noted especially with the bladder over the cervix.  Atrophic ovaries.  Fibroid uterus.  PROCEDURE:  . The patient was taken to the OR where anesthesia was administed. She was prepped and draped in the normal sterile fashion in the dorsal lithotomy position in the Merlin stirrups. A time out was performed. A Graves speculum was inserted, the cervix was grasped with a single tooth tenaculum and the endometrial cavity was sounded. The cervix was progressively dilated to a size 18 [place] with [employer] dilators. A V-Care uterine manipulator was inserted in the usual fashion without incident. Gloves were changed and attention was turned to the abdomen.   . An infraumbilical transverse 57mm skin incision was made with the scalpel after local anesthesia applied to the skin. A Veress-step needle was inserted in the usual fashion and confirmed using the hanging drop technique. A pneumoperitoneum was obtained by insufflation of CO2 (opening pressure of 47mmHg) to 58mmHg. An 8 mm trocar is then placed under direct visualization with the laparoscope.  (This is later changes to a 15 mm trocar/port).  A diagnostic laparoscopy was performed yielding the previously described  findings. Attention was turned to the left lower quadrant where after visualization of the inferior epigastric vessels a 23mm skin incision was made with the scalpel. A 5 mm laparoscopic port was inserted. The same procedure was repeated in the right lower quadrant with a 28mm trocar (this is later changed to an 8 mm trocar/ port).   . Attention was turned to the left aspect of the uterus, where after visualization of the ureter, the round ligament was coagulated and transected using the 78mm Harmonic Scapel. The anterior and posterior leafs of the broad ligament were dissected off as the anterior one was coagulated and transected in a caudal direction towards the cuff of the uterine manipulator.  Attention was then turned to the left fallopian tube and ovary which was recognized by visualization of the fimbria. The infundibulopelvic ligament and its blood vessels were carefully coagulated and transected using the Harmonic scapel.  Attention was turned to the right aspect of the uterus where the same procedure was performed.  The vesicouterine reflection of the peritoneum was dissected with the harmonic scapel and the bladder flap was created bluntly.  The uterine vessels were coagulated and transected bilaterally using first bipolar cautery and then the harmonic scapel.   The bladder was too adherent to the cervix to safely dissect cervix free, so a supracervical approach was done, for the patients safety, with the preop knowledge also that she did not have a history of cervical dysplasia or cancer.   The uterus is dissected from the lower portion of the cervix with the harmonic scapel, and then the endocervical canal is oversewn with 2 delayed absorbable surtures in a figure of eight fashion.     . The cavity was copiously irrigated. A survey of  the pelvic cavity revealed adequate hemostasis and no injury to bowel, bladder, or ureter.    . The uterine and adnexa specimen is then to be removed from the abdomen.   A laparoscopic bag is placed through the larger umbilical port and the specimen placed in the bag.  It is then cinched up to the anterior abdominal wall, and an allexis skin retractor is placed through a slightly enlarged incision.  A power morecellator is placed into the bag for some morcellation and extraction of the tissue, and then scapel based hand morcellation is done to complete the removal of the specimen.  No spillage of tissue is visualized based on this technique.  . The umbilical and RLQ incisions are closed (fascia) with a 0 Vicryl suture after gas is expelled from the abdomen.  Skin is closed with 4-0 vicryl and skin adhesive.  The assistance of my assisting-physician was vital to resect and retract interchangably with self on each side.   . A diagnostic cystoscopy was performed using saline distension of bladder with no lesions or injuries noted.  Bilateral urine flow from each ureteral orifice is visualized.    . Patient goes to recovery room in stable condition.  All sponge, instrument, and needle counts are correct x2.    Barnett Applebaum, MD, [facility], [place] . 05/14/2020  1:28 PM

## 2020-05-15 ENCOUNTER — Encounter: Payer: Self-pay | Admitting: Obstetrics & Gynecology

## 2020-05-15 LAB — SURGICAL PATHOLOGY

## 2020-05-17 ENCOUNTER — Encounter: Payer: BC Managed Care – PPO | Admitting: Obstetrics & Gynecology

## 2020-05-20 ENCOUNTER — Encounter: Payer: Self-pay | Admitting: Podiatry

## 2020-05-27 ENCOUNTER — Ambulatory Visit (INDEPENDENT_AMBULATORY_CARE_PROVIDER_SITE_OTHER): Payer: BC Managed Care – PPO | Admitting: Obstetrics & Gynecology

## 2020-05-27 ENCOUNTER — Other Ambulatory Visit: Payer: Self-pay

## 2020-05-27 ENCOUNTER — Encounter: Payer: Self-pay | Admitting: Obstetrics & Gynecology

## 2020-05-27 VITALS — BP 122/74 | Ht 67.0 in | Wt 222.0 lb

## 2020-05-27 DIAGNOSIS — Z90711 Acquired absence of uterus with remaining cervical stump: Secondary | ICD-10-CM | POA: Insufficient documentation

## 2020-05-27 NOTE — Progress Notes (Signed)
  Postoperative Follow-up Patient presents post op from [facility], [place] for fibroids and pelvic pain, 2 weeks ago. Path: DIAGNOSIS:  A. UTERUS, BILATERAL FALLOPIAN TUBES AND OVARIES; SUPRA CERVICAL  HYSTERECTOMY AND BILATERAL SALPINGO-OOPHORECTOMY:  - BENIGN INACTIVE ENDOMETRIUM.  - INTRAMURAL LEIOMYOMATA (4).  - BILATERAL BENIGN OVARIES AND FALLOPIAN TUBES.  Subjective: Patient reports marked improvement in her preop symptoms. Eating a regular diet without difficulty. The patient is not having any pain.  Activity: normal activities of daily living. Patient reports additional symptom's since surgery of No bleeding  Objective: BP 122/74   Ht 5\' 7"  (1.702 m)   Wt 222 lb (100.7 kg)   BMI 34.77 kg/m  Physical Exam Constitutional:      General: She is not in acute distress.    Appearance: She is well-developed.  Cardiovascular:     Rate and Rhythm: Normal rate.  Pulmonary:     Effort: Pulmonary effort is normal.  Abdominal:     General: There is no distension.     Palpations: Abdomen is soft.     Tenderness: There is no abdominal tenderness.     Comments: Incision Healing Well   Musculoskeletal:        General: Normal range of motion.  Neurological:     Mental Status: She is alert and oriented to person, place, and time.     Cranial Nerves: No cranial nerve deficit.  Skin:    General: Skin is warm and dry.     Assessment: s/p :  [place] [facility] progressing well  Plan: Patient has done well after surgery with no apparent complications.  I have discussed the post-operative course to date, and the expected progress moving forward.  The patient understands what complications to be concerned about.  I will see the patient in routine follow up, or sooner if needed.    Activity plan: No restriction except for Pelvic rest.  Hoyt Koch 05/27/2020, 1:46 PM

## 2020-05-29 NOTE — Telephone Encounter (Signed)
Please advise 

## 2020-05-30 ENCOUNTER — Encounter: Payer: Self-pay | Admitting: Podiatry

## 2020-06-03 ENCOUNTER — Telehealth: Payer: Self-pay

## 2020-06-03 NOTE — Telephone Encounter (Signed)
Check w patient and see how she is doing

## 2020-06-03 NOTE — Telephone Encounter (Signed)
Pt calling; is having post op discomfort and pain; had hyst 3/1st; still having a lot of discomfort especially with sitting; doesn't know if it's the air they use during surgery or the surgery itself.  [phone]

## 2020-06-04 NOTE — Telephone Encounter (Signed)
All part of the post op healing process, so it seems, let her know this is to be expected, and will always see her if pain seems more pronounced or excessive.  Fatigue and stamina concerns takes many weeks to return to baseline.

## 2020-06-04 NOTE — Telephone Encounter (Signed)
Left message to return call 

## 2020-06-04 NOTE — Telephone Encounter (Signed)
Pt is back at work, been back since last Thursday, sitting down is painful. Really tired, just lays around and sleeps in. Her sleep pattern is messed up. She goes to the bathroom a lot. Would this pain be normal? Should she be seen or is she expecting to be well too soon?

## 2020-06-04 NOTE — Telephone Encounter (Signed)
Pt aware.

## 2020-06-11 ENCOUNTER — Encounter: Payer: Self-pay | Admitting: Vascular Surgery

## 2020-06-11 ENCOUNTER — Other Ambulatory Visit: Payer: Self-pay

## 2020-06-11 ENCOUNTER — Ambulatory Visit (INDEPENDENT_AMBULATORY_CARE_PROVIDER_SITE_OTHER): Payer: BC Managed Care – PPO | Admitting: Vascular Surgery

## 2020-06-11 VITALS — BP 148/88 | HR 93 | Temp 97.9°F | Resp 20 | Ht 67.0 in | Wt 222.0 lb

## 2020-06-11 DIAGNOSIS — I8393 Asymptomatic varicose veins of bilateral lower extremities: Secondary | ICD-10-CM

## 2020-06-11 DIAGNOSIS — I872 Venous insufficiency (chronic) (peripheral): Secondary | ICD-10-CM | POA: Diagnosis not present

## 2020-06-11 NOTE — Progress Notes (Signed)
[facility] [place]  ASSESSMENT / PLAN: 60 y.o. female with right leg chronic venous insufficiency: edema, reticular veins and greater saphenous vein incompetence. She is minimally symptomatic from this and would like to continue conservative therapy. I counseled her to return to care if her symptoms worsen or if she would like to discuss options for intervention.  CHIEF COMPLAINT: right leg swelling  HISTORY OF PRESENT ILLNESS: Desiree Walker is a 60 y.o. female who presents to clinic for evaluation of right leg swelling.  She reports this is been going on for some time.  She reports a strong family history of venous disease in her family. Has not had a DVT before.  Started noticing reticular veins across her right leg and wanted to discuss the significance of these.  Swelling in her right leg gets better with compression and elevation.  Past Medical History:  Diagnosis Date  . Anemia    H/O  . Anxiety   . Arthritis   . BRCA negative 08/2018   MyRisk neg except NTHL1 VUS  . Complication of anesthesia   . Family history of breast cancer 08/2018   IBIS=8.65/riskscore=7.9%  . Family history of ovarian cancer    MyRisk neg except NTHL1 VUS  . GERD (gastroesophageal reflux disease)    NO MEDS  . Hyperlipidemia   . Hypertension   . Hypothyroidism    Hashimoto's per patient  . IFG (impaired fasting glucose) 08/05/2015  . Metabolic syndrome 1/61/0960  . Palpitations 2012   Evaluated by Ida Rogue, MD, Holter moniter  . PONV (postoperative nausea and vomiting)   . Pre-diabetes   . Rectal mass   . Thyroid disease    unspecified hypothyroidism    Past Surgical History:  Procedure Laterality Date  . ANAL FISTULOTOMY N/A 11/30/2014   Procedure: ANAL FISTULOTOMY;  Surgeon: Dia Crawford III, MD;  Location: [facility] ORS;  Service: General;  Laterality: N/A;  . BREAST BIOPSY Right 09/08/2018   affirm bx of mass, coil clip, ORGANIZING FAT NECROSIS AND  . CESAREAN  SECTION    . COLONOSCOPY  2014  . CYSTOSCOPY N/A 05/14/2020   Procedure: CYSTOSCOPY;  Surgeon: Gae Dry, MD;  Location: [facility] ORS;  Service: Gynecology;  Laterality: N/A;  . DILATION AND CURETTAGE OF UTERUS    . ENDOMETRIAL ABLATION W/ NOVASURE  05/29/2009   Dr Kenton Kingfisher  . left foot surgery    . RECTAL EXAM UNDER ANESTHESIA N/A 11/30/2014   Procedure: RECTAL EXAM UNDER ANESTHESIA;  Surgeon: Dia Crawford III, MD;  Location: [facility] ORS;  Service: General;  Laterality: N/A;  . TOTAL LAPAROSCOPIC HYSTERECTOMY WITH BILATERAL SALPINGO OOPHORECTOMY Bilateral 05/14/2020   LAPAROSCOPIC SUPRACERVICAL HYSTERECTOMY WITH BILATERAL SALPINGO OOPHORECTOMY;  Surgeon: Gae Dry, MD;  Location: [facility] ORS;  Service: Gynecology;  Laterality: Bilateral;    Family History  Problem Relation Age of Onset  . Hypertension Mother   . Heart disease Mother 34       "blockages"  . Alzheimer's disease Mother   . Diverticulitis Father   . Hernia Father   . Depression Father   . Alzheimer's disease Father   . Hypothyroidism Sister   . Uterine cancer Sister 24  . Ovarian cancer Sister   . Breast cancer Maternal Aunt 44  . Hypothyroidism Daughter   . Hypertension Brother   . Stroke Maternal Grandfather   . Heart attack Maternal Grandfather   . Stroke Paternal Grandmother   . Hypothyroidism Sister   . Ovarian cancer Sister   .  Hypothyroidism Daughter   . Breast cancer Paternal Aunt 32  . Breast cancer Other 30    Social History   Socioeconomic History  . Marital status: Married    Spouse name: Herbie Baltimore A. Hostetter  . Number of children: 3  . Years of education: Not on file  . Highest education level: Associate degree: occupational, Hotel manager, or vocational program  Occupational History  . 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 Smoking status: Never Smoker  . Smokeless tobacco: Never Used  Vaping Use  . Vaping Use: Never used  Substance and Sexual Activity  . Alcohol use: No     Alcohol/week: 0.0 standard drinks  . Drug use: No  . Sexual activity: Yes    Birth control/protection: Post-menopausal  Other Topics Concern  . Not on file  Social History Narrative  . Not on file   Social Determinants of Health   Financial Resource Strain: Not on file  Food Insecurity: Not on file  Transportation Needs: Not on file  Physical Activity: Not on file  Stress: Not on file  Social Connections: Not on file  Intimate Partner Violence: Not on file    Allergies  Allergen Reactions  . Scopolamine Other (See Comments)    "[place]"    Current Outpatient Medications  Medication Sig Dispense Refill  . DUPIXENT 300 MG/2ML SOPN Inject 300 mg into the skin every 14 (fourteen) days.    Marland Kitchen levothyroxine (SYNTHROID) 112 MCG tablet TAKE ONE TABLET DAILY. EVERY SUNDAY TAKE AN EXTRA ONE-HALF TABLET. (Patient taking differently: Take 112-168 mcg by mouth See admin instructions. Take 1.5 tablets (168 mcg) by mouth on Sundays & take 1 tablet (112 mcg) by mouth on all other days.) 90 tablet 3  . losartan (COZAAR) 100 MG tablet Take 100 mg by mouth daily.    Marland Kitchen acetaminophen (TYLENOL) 500 MG tablet Take 500 mg by mouth every 6 (six) hours as needed. (Patient not taking: Reported on 06/11/2020)     No current facility-administered medications for this visit.    REVIEW OF SYSTEMS:  _0  denotes positive finding, _1  denotes negative finding Cardiac  Comments:  Chest pain or chest pressure:    Shortness of breath upon exertion:    Short of breath when lying flat:    Irregular heart rhythm:        Vascular    Pain in calf, thigh, or hip brought on by ambulation:    Pain in feet at night that wakes you up from your sleep:     Blood clot in your veins:    Leg swelling:         Pulmonary    Oxygen at home:    Productive cough:     Wheezing:         Neurologic    Sudden weakness in arms or legs:     Sudden numbness in arms or legs:     Sudden onset of  difficulty speaking or slurred speech:    Temporary loss of vision in one eye:     Problems with dizziness:         Gastrointestinal    Blood in stool:     Vomited blood:         Genitourinary    Burning when urinating:     Blood in urine:        Psychiatric    Major depression:         Hematologic  Bleeding problems:    Problems with blood clotting too easily:        Skin    Rashes or ulcers:        Constitutional    Fever or chills:      PHYSICAL EXAM  Vitals:   06/11/20 1121  BP: (!) 148/88  Pulse: 93  Resp: 20  Temp: 97.9 F (36.6 C)  SpO2: 97%  Weight: 222 lb (100.7 kg)  Height: _0  (1.702 m)    Constitutional: well appearing. no distress. Appears well nourished.  Neurologic: CN intact. no focal findings. no sensory loss. Psychiatric: Mood and affect symmetric and appropriate. Eyes: No icterus. No conjunctival pallor. Ears, nose, throat: mucous membranes moist. Midline trachea.  Cardiac: regular rate and rhythm.  Respiratory: unlabored. Abdominal: soft, non-tender, non-distended.  Peripheral vascular:  2+ Dps  Reticular veins about R calf   Mild varicosities medial / proximal R calf Extremity: No edema. No cyanosis. No pallor.  Skin: No gangrene. No ulceration.  Lymphatic: No Stemmer's sign. No palpable lymphadenopathy.  PERTINENT LABORATORY AND RADIOLOGIC DATA  Most recent CBC CBC Latest Ref Rng & Units 05/08/2020 05/12/2019 08/19/2018  WBC 4.0 - 10.5 K/uL 4.9 4.2 5.2  Hemoglobin 12.0 - 15.0 g/dL 13.1 12.7 12.9  Hematocrit 36.0 - 46.0 % 39.0 38.0 39.7  Platelets 150 - 400 K/uL 226 204 217     Most recent CMP CMP Latest Ref Rng & Units 05/12/2019 01/06/2019 08/19/2018  Glucose 65 - 99 mg/dL 114(H) 108(H) 111(H)  BUN 7 - 25 mg/dL _1 Creatinine 0.50 - 0.99 mg/dL 0.84 0.81 0.84  Sodium 135 - 146 mmol/L 139 139 140  Potassium 3.5 - 5.3 mmol/L 4.2 4.5 3.9  Chloride 98 - 110 mmol/L 107 107 105  CO2 20 - 32 mmol/L _2 Calcium 8.6 -  10.4 mg/dL 9.1 9.0 8.9  Total Protein 6.1 - 8.1 g/dL 6.0(L) 6.5 7.1  Total Bilirubin 0.2 - 1.2 mg/dL 0.3 0.3 0.6  Alkaline Phos 38 - 126 U/L - - 82  AST 10 - 35 U/L _3 ALT 6 - 29 U/L _4 Renal function CrCl cannot be calculated (Patient's most recent lab result is older than the maximum 21 days allowed.).  Hemoglobin A1C (%)  Date Value  09/14/2013 5.9   Hgb A1c MFr Bld (% of total Hgb)  Date Value  05/12/2019 5.9 (H)    Ldl Cholesterol, Calc  Date Value Ref Range Status  09/14/2013 153 (H) 0 - 100 mg/dL Final   LDL Cholesterol (Calc)  Date Value Ref Range Status  05/12/2019 137 (H) mg/dL (calc) Final    Comment:    Reference range: <100 . Desirable range <100 mg/dL for primary prevention;   <70 mg/dL for patients with CHD or diabetic patients  with > or = 2 CHD risk factors. Marland Kitchen LDL-C is now calculated using the Martin-Hopkins  calculation, which is a validated novel method providing  better accuracy than the Friedewald equation in the  estimation of LDL-C.  Cresenciano Genre et al. Annamaria Helling. 8550;158(68): 2061-2068  (http://education.[facility].com/faq/FAQ164)      Vascular Imaging: Lower Venous Reflux Study   Indications: Swelling, Pain, and varicosities.    Limitations: Unable to tolerate compressions.  Performing Technologist: Alvia Grove RVT     Examination Guidelines: A complete evaluation includes B-mode imaging,  spectral  Doppler, color Doppler, and power Doppler as needed of all accessible  portions  of  each vessel. Bilateral testing is considered an integral part of a  complete  examination. Limited examinations for reoccurring indications may be  performed  as noted. The reflux portion of the exam is performed with the patient in  reverse Trendelenburg.  Significant venous reflux is defined as >500 ms in the superficial venous  system, and >1 second in the deep venous system.     Venous Reflux Times   +--------------+---------+------+----------+------------+------------------  ----+  RIGHT     Reflux NoReflux Reflux Diameter cmsComments                       Yes   Time                      +--------------+---------+------+----------+------------+------------------  ----+  GSV at SFJ                 0.66                +--------------+---------+------+----------+------------+------------------  ----+  GSV prox thigh               049                 +--------------+---------+------+----------+------------+------------------  ----+  GSV mid thigh       yes  >500 ms   0.22  out of fascia       +--------------+---------+------+----------+------------+------------------  ----+  GSV dist thigh                   NV             +--------------+---------+------+----------+------------+------------------  ----+  GSV at knee                     NV             +--------------+---------+------+----------+------------+------------------  ----+  GSV prox calf                    NV             +--------------+---------+------+----------+------------+------------------  ----+  GSV mid calf       yes  >500 ms   0.24  branches out of  fascia  +--------------+---------+------+----------+------------+------------------  ----+  SSV Pop Fossa                0.36  distal thigh        +--------------+---------+------+----------+------------+------------------  ----+  SSV prox calf                0.20                +--------------+---------+------+----------+------------+------------------  ----+  SSV mid calf                0.28                 +--------------+---------+------+----------+------------+------------------  ----+                        0.19                +--------------+---------+------+----------+------------+------------------  ----+         Summary:  Right:  - No evidence of deep vein thrombosis seen in the right lower extremity,  from the common femoral through the popliteal veins.  - There is no evidence of deep venous reflux seen in the right lower  extremity.  - No evidence of superficial venous reflux  seen in the right short  saphenous vein.  - Venous reflux is noted in the right greater saphenous vein in the thigh.  - Venous reflux is noted in the right greater saphenous vein in the calf.    *See table(s) above for measurements and observations.   Electronically signed by Harold Barban MD on 04/17/2020 at 7:18:44 PM.   Yevonne Aline. Stanford Breed, MD [facility] Phone Number: [phone] 06/11/2020 11:43 AM

## 2020-06-12 ENCOUNTER — Ambulatory Visit (INDEPENDENT_AMBULATORY_CARE_PROVIDER_SITE_OTHER): Payer: BC Managed Care – PPO | Admitting: Podiatry

## 2020-06-12 DIAGNOSIS — T8484XA Pain due to internal orthopedic prosthetic devices, implants and grafts, initial encounter: Secondary | ICD-10-CM

## 2020-06-12 DIAGNOSIS — L249 Irritant contact dermatitis, unspecified cause: Secondary | ICD-10-CM | POA: Insufficient documentation

## 2020-06-12 DIAGNOSIS — M659 Synovitis and tenosynovitis, unspecified: Secondary | ICD-10-CM

## 2020-06-12 DIAGNOSIS — E038 Other specified hypothyroidism: Secondary | ICD-10-CM | POA: Insufficient documentation

## 2020-06-12 DIAGNOSIS — G5792 Unspecified mononeuropathy of left lower limb: Secondary | ICD-10-CM

## 2020-06-12 DIAGNOSIS — R7302 Impaired glucose tolerance (oral): Secondary | ICD-10-CM | POA: Insufficient documentation

## 2020-06-12 NOTE — Progress Notes (Signed)
Patient presents today for orthotic pick up. Patient stated that she was not happy with the inserts that she got. Patient stated that Liliane Channel and the patient had agreed on the orthotic being just a sulcus and the orthotics that she got had a little flap at the top and that was not going to work.  I stated to the patient on how to wear them and try them and patient stated that she had an appointment to see Dr Amalia Hailey on Tuesday April 6th , 2022 and I stated to talk to Dr Amalia Hailey and bring the orthotics that patient received on June 12, 2020 and patient agreed.

## 2020-06-12 NOTE — Patient Instructions (Signed)

## 2020-06-18 ENCOUNTER — Ambulatory Visit: Payer: BC Managed Care – PPO | Admitting: Podiatry

## 2020-06-18 ENCOUNTER — Encounter: Payer: Self-pay | Admitting: Vascular Surgery

## 2020-06-18 ENCOUNTER — Other Ambulatory Visit: Payer: Self-pay

## 2020-06-18 DIAGNOSIS — M659 Synovitis and tenosynovitis, unspecified: Secondary | ICD-10-CM | POA: Diagnosis not present

## 2020-06-18 DIAGNOSIS — M25572 Pain in left ankle and joints of left foot: Secondary | ICD-10-CM

## 2020-06-18 DIAGNOSIS — M19072 Primary osteoarthritis, left ankle and foot: Secondary | ICD-10-CM | POA: Diagnosis not present

## 2020-06-18 DIAGNOSIS — T8484XA Pain due to internal orthopedic prosthetic devices, implants and grafts, initial encounter: Secondary | ICD-10-CM | POA: Diagnosis not present

## 2020-06-18 DIAGNOSIS — M65972 Unspecified synovitis and tenosynovitis, left ankle and foot: Secondary | ICD-10-CM

## 2020-06-18 MED ORDER — MELOXICAM 15 MG PO TABS: 15.0000 mg | ORAL_TABLET | Freq: Every day | ORAL | 1 refills | Status: DC

## 2020-06-18 MED ORDER — BETAMETHASONE SOD PHOS & ACET 6 (3-3) MG/ML IJ SUSP
3.0000 mg | Freq: Once | INTRAMUSCULAR | Status: AC
  Administered 2020-06-18: 3 mg via INTRA_ARTICULAR

## 2020-06-18 NOTE — Progress Notes (Signed)
HPI: 60 y.o. female presenting today for follow-up evaluation and questions associated to her left foot and ankle.  She does have a history of subtalar joint arthrodesis several years prior and she has had pain and symptoms to the left foot and ankle ever since.    Patient also states that most recently she has been having posterior thigh and buttocks pain that extends up into her lower back to the left lower extremity.  This is been ongoing for several weeks now.  Exacerbated by walking and climbing stairs.  Past Medical History:  Diagnosis Date  . Anemia    H/O  . Anxiety   . Arthritis   . BRCA negative 08/2018   MyRisk neg except NTHL1 VUS  . Complication of anesthesia   . Family history of breast cancer 08/2018   IBIS=8.65/riskscore=7.9%  . Family history of ovarian cancer    MyRisk neg except NTHL1 VUS  . GERD (gastroesophageal reflux disease)    NO MEDS  . Hyperlipidemia   . Hypertension   . Hypothyroidism    Hashimoto's per patient  . IFG (impaired fasting glucose) 08/05/2015  . Metabolic syndrome 8/36/6294  . Palpitations 2012   Evaluated by Ida Rogue, MD, Holter moniter  . PONV (postoperative nausea and vomiting)   . Pre-diabetes   . Rectal mass   . Thyroid disease    unspecified hypothyroidism     Physical Exam: General: The patient is alert and oriented x3 in no acute distress.  Dermatology: Skin is warm, dry and supple bilateral lower extremities. Negative for open lesions or macerations.    Vascular: Palpable pedal pulses bilaterally. No edema or erythema noted. Capillary refill within normal limits.  Neurological: Epicritic and protective threshold grossly intact bilaterally.  There is some paresthesia with neuritis noted to the left foot  Musculoskeletal Exam: Range of motion within normal limits to all pedal and ankle joints bilateral. Muscle strength 5/5 in all groups bilateral.  There is some pain on palpation to the lateral aspect of the left  ankle joint.  Negative range of motion to the subtalar joint.  Today there is pain with direct palpation of the sinus tarsi of the left foot  Assessment: 1.  H/o isolated subtalar joint arthrodesis left 2.  Neuritis left foot 3.  Minimally symptomatic orthopedic hardware left foot 4.  DJD left foot and ankle 5.  Sinus tarsitis left 6.  Possible lumbar radiculopathy LLE   Plan of Care:  1. Patient evaluated.  2. Again we discussed the patient's history of prior surgery and explained that she is dealing with symptoms of generalized arthritis to the left foot and ankle.  Recommend meloxicam as needed 3.  Patient is having some neuritis pins-and-needles sensation to the left foot.  Prescription for gabapentin 100 mg nightly 4.  Continue meloxicam 15 mg as needed 5.  I explained to the patient that I do not believe that most of her symptoms are coming from the orthopedic screws in her foot.  Do not feel there is much benefit to removing the screws since they are stable and intact 6.  Recommend follow-up with neurospine specialist, recommend [facility]  7.  Custom molded orthotics were also dispensed today.  Break instructions and care was provided today.   8.  Return to clinic 6 weeks     Edrick Kins, DPM [facility]  Dr. Edrick Kins, DPM    [address]  [place], [place] [address]                Office [phone]  Fax [phone]     

## 2020-06-25 ENCOUNTER — Encounter: Payer: Self-pay | Admitting: Obstetrics & Gynecology

## 2020-06-25 ENCOUNTER — Other Ambulatory Visit: Payer: Self-pay

## 2020-06-25 ENCOUNTER — Ambulatory Visit (INDEPENDENT_AMBULATORY_CARE_PROVIDER_SITE_OTHER): Payer: BC Managed Care – PPO | Admitting: Obstetrics & Gynecology

## 2020-06-25 VITALS — BP 140/90 | Ht 67.0 in | Wt 221.0 lb

## 2020-06-25 DIAGNOSIS — Z90711 Acquired absence of uterus with remaining cervical stump: Secondary | ICD-10-CM

## 2020-06-25 NOTE — Progress Notes (Signed)
  Postoperative Follow-up Patient presents post op from [facility] for fibroids, 6 weeks ago.  Subjective: Patient reports marked improvement in her preop symptoms. Eating a regular diet without difficulty. The patient is not having any pain.  Activity: normal activities of daily living. Patient reports additional symptom's since surgery of None.  Objective: BP 140/90   Ht 5\' 7"  (1.702 m)   Wt 221 lb (100.2 kg)   BMI 34.61 kg/m  Physical Exam Constitutional:      General: She is not in acute distress.    Appearance: She is well-developed.  Genitourinary:     Bladder normal.     No vaginal erythema or bleeding.     No vaginal atrophy present.     Right Adnexa: not tender and no mass present.    Left Adnexa: not tender and no mass present.    No cervical motion tenderness, discharge, polyp or nabothian cyst.     Uterus is absent.     Pelvic exam was performed with patient in the lithotomy position.  HENT:     Head: Normocephalic and atraumatic.     Nose: Nose normal.  Abdominal:     General: There is no distension.     Palpations: Abdomen is soft.     Tenderness: There is no abdominal tenderness.  Musculoskeletal:        General: Normal range of motion.  Neurological:     Mental Status: She is alert and oriented to person, place, and time.     Cranial Nerves: No cranial nerve deficit.  Skin:    General: Skin is warm and dry.  Psychiatric:        Attention and Perception: Attention normal.        Mood and Affect: Mood and affect normal.        Speech: Speech normal.        Behavior: Behavior normal.        Thought Content: Thought content normal.        Judgment: Judgment normal.     Assessment: s/p :  [place] [facility] progressing well  Plan: Patient has done well after surgery with no apparent complications.  I have discussed the post-operative course to date, and the expected progress moving forward.  The patient understands what complications to be concerned about.  I will  see the patient in routine follow up, or sooner if needed.    Activity plan: No restriction.  Hoyt Koch 06/25/2020, 11:25 AM

## 2020-06-27 ENCOUNTER — Ambulatory Visit
Admission: RE | Admit: 2020-06-27 | Discharge: 2020-06-27 | Disposition: A | Discharge status: Home / Self Care | Payer: BC Managed Care – PPO | Source: Ambulatory Visit | Attending: Nurse Practitioner | Admitting: Nurse Practitioner

## 2020-06-27 ENCOUNTER — Other Ambulatory Visit: Payer: Self-pay

## 2020-06-27 DIAGNOSIS — Z1231 Encounter for screening mammogram for malignant neoplasm of breast: Secondary | ICD-10-CM | POA: Diagnosis not present

## 2020-07-19 ENCOUNTER — Telehealth: Payer: Self-pay | Admitting: *Deleted

## 2020-07-19 NOTE — Telephone Encounter (Signed)
Let me just re-order a more flexible orthotic. Dr. Amalia Hailey

## 2020-07-19 NOTE — Telephone Encounter (Signed)
"  I got my orthotics.  I have been trying to wear them.  They are too hard.  They make my feet hurt and my back.  It's not what I discussed with Rick.  I told him I wanted them to be more flexible.  Is there something else I can do?"  Did you gradually start wearing them?  "Yes, I started out about a hour then increased each day.  They are too hard."  Do you want to come in to see Dr. Amalia Hailey?  "I just saw him recently.  Those co-pays add up, I can't afford all that.  What else can I do."  I'll send the message to Dr. Amalia Hailey.  We may be able to send your orthotics back for an adjustment.

## 2020-07-30 ENCOUNTER — Ambulatory Visit: Payer: BC Managed Care – PPO | Admitting: Podiatry

## 2020-08-02 ENCOUNTER — Other Ambulatory Visit: Payer: Self-pay

## 2020-08-02 ENCOUNTER — Ambulatory Visit: Payer: BC Managed Care – PPO | Admitting: Podiatry

## 2020-08-02 DIAGNOSIS — M25572 Pain in left ankle and joints of left foot: Secondary | ICD-10-CM

## 2020-08-13 DIAGNOSIS — M25572 Pain in left ankle and joints of left foot: Secondary | ICD-10-CM | POA: Diagnosis not present

## 2020-08-13 MED ORDER — BETAMETHASONE SOD PHOS & ACET 6 (3-3) MG/ML IJ SUSP
3.0000 mg | Freq: Once | INTRAMUSCULAR | Status: AC
  Administered 2020-08-13: 3 mg via INTRA_ARTICULAR

## 2020-08-13 NOTE — Progress Notes (Signed)
HPI: 60 y.o. female presenting today for follow-up evaluation and questions associated to her left foot and ankle.  She does have a history of subtalar joint arthrodesis several years prior and she has had pain and symptoms to the left foot and ankle ever since.    Patient also states that most recently she has been having posterior thigh and buttocks pain that extends up into her lower back to the left lower extremity.  This is been ongoing for several weeks now.  Exacerbated by walking and climbing stairs.  Now she is experiencing pain to the left foot possibly due to compensation.  She would like to have it evaluated today  Past Medical History:  Diagnosis Date  . Anemia    H/O  . Anxiety   . Arthritis   . BRCA negative 08/2018   MyRisk neg except NTHL1 VUS  . Complication of anesthesia   . Family history of breast cancer 08/2018   IBIS=8.65/riskscore=7.9%  . Family history of ovarian cancer    MyRisk neg except NTHL1 VUS  . GERD (gastroesophageal reflux disease)    NO MEDS  . Hyperlipidemia   . Hypertension   . Hypothyroidism    Hashimoto's per patient  . IFG (impaired fasting glucose) 08/05/2015  . Metabolic syndrome 6/80/3212  . Palpitations 2012   Evaluated by Ida Rogue, MD, Holter moniter  . PONV (postoperative nausea and vomiting)   . Pre-diabetes   . Rectal mass   . Thyroid disease    unspecified hypothyroidism     Physical Exam: General: The patient is alert and oriented x3 in no acute distress.  Dermatology: Skin is warm, dry and supple bilateral lower extremities. Negative for open lesions or macerations.    Vascular: Palpable pedal pulses bilaterally. No edema or erythema noted. Capillary refill within normal limits.  Neurological: Epicritic and protective threshold grossly intact bilaterally.  There is some paresthesia with neuritis noted to the left foot  Musculoskeletal Exam: Range of motion within normal limits to all pedal and ankle joints  bilateral. Muscle strength 5/5 in all groups bilateral.  Negative range of motion to the subtalar joint.  Today there is pain with direct palpation of the sinus tarsi of the left foot  Assessment: 1.  H/o isolated subtalar joint arthrodesis left 2.  Neuritis left foot 3.  Minimally symptomatic orthopedic hardware left foot 4.  DJD left foot and ankle 5.  Sinus tarsitis left 6.  Possible lumbar radiculopathy LLE   Plan of Care:  1. Patient evaluated.  2. Again we discussed the patient's history of prior surgery and explained that she is dealing with symptoms of generalized arthritis to the left foot and ankle.  Recommend meloxicam as needed 3.  Patient is having some neuritis pins-and-needles sensation to the left foot.  Continue gabapentin 100 mg nightly 4.  Continue meloxicam 15 mg as needed 5.  I explained to the patient that I do not believe that most of her symptoms are coming from the orthopedic screws in her foot.  Do not feel there is much benefit to removing the screws since they are stable and intact 6.  Recommend follow-up with neurospine specialist, recommend [facility]  7.  Patient states that the orthotics are uncomfortable.  Today were going to order a new pair of custom orthotics that are more cushioned and provide more cushion in her shoes.  The orthotics that she received are very hard with minimal cushion, more for a dress  shoe 8.  Injection of 0.5 cc Celestone Soluspan injected over the sinus tarsi left  9.  Return to clinic 6 weeks     Edrick Kins, DPM [facility]  Dr. Edrick Kins, DPM    [address], [place] [address]                Office [phone]  Fax [phone]

## 2020-08-20 ENCOUNTER — Ambulatory Visit: Payer: BC Managed Care – PPO | Admitting: Podiatry

## 2020-08-20 ENCOUNTER — Ambulatory Visit (INDEPENDENT_AMBULATORY_CARE_PROVIDER_SITE_OTHER): Payer: BC Managed Care – PPO

## 2020-08-20 ENCOUNTER — Other Ambulatory Visit: Payer: Self-pay

## 2020-08-20 DIAGNOSIS — S93601A Unspecified sprain of right foot, initial encounter: Secondary | ICD-10-CM | POA: Diagnosis not present

## 2020-08-20 DIAGNOSIS — S9031XA Contusion of right foot, initial encounter: Secondary | ICD-10-CM

## 2020-08-20 MED ORDER — METHYLPREDNISOLONE 4 MG PO TBPK: ORAL_TABLET | ORAL | 0 refills | Status: DC

## 2020-08-20 MED ORDER — MELOXICAM 15 MG PO TABS: 15.0000 mg | ORAL_TABLET | Freq: Every day | ORAL | 1 refills | Status: DC

## 2020-08-20 NOTE — Progress Notes (Signed)
   HPI: 60 y.o. female presenting today for new complaint regarding an injury that the patient sustained yesterday, 08/19/2020.  Patient states that she was getting out of the shower when she slipped and fell.  She sprained her foot at the time of the incident.  She went to work that day however by the end of the day her foot was swollen and bruised.  She presents today for further treatment evaluation.  Currently she has not done anything for treatment  Past Medical History:  Diagnosis Date  . Anemia    H/O  . Anxiety   . Arthritis   . BRCA negative 08/2018   MyRisk neg except NTHL1 VUS  . Complication of anesthesia   . Family history of breast cancer 08/2018   IBIS=8.65/riskscore=7.9%  . Family history of ovarian cancer    MyRisk neg except NTHL1 VUS  . GERD (gastroesophageal reflux disease)    NO MEDS  . Hyperlipidemia   . Hypertension   . Hypothyroidism    Hashimoto's per patient  . IFG (impaired fasting glucose) 08/05/2015  . Metabolic syndrome 10/13/2016  . Palpitations 2012   Evaluated by Timothy Gollan, MD, Holter moniter  . PONV (postoperative nausea and vomiting)   . Pre-diabetes   . Rectal mass   . Thyroid disease    unspecified hypothyroidism     Physical Exam: General: The patient is alert and oriented x3 in no acute distress.  Dermatology: Skin is warm, dry and supple bilateral lower extremities. Negative for open lesions or macerations.  Vascular: Palpable pedal pulses bilaterally.  Edema with ecchymosis noted right forefoot encompassing the first and second MTPJ's with slight bruising extending into the digits.  Capillary refill within normal limits.  Neurological: Epicritic and protective threshold grossly intact bilaterally.   Musculoskeletal Exam: No pedal deformities noted.  There is tenderness to palpation throughout the right forefoot  Radiographic Exam:  Normal osseous mineralization. Joint spaces preserved. No fracture/dislocation/boney destruction.     Assessment: 1.  Right forefoot sprain/contusion   Plan of Care:  1. Patient evaluated. X-Rays reviewed.  2.  Cam boot dispensed.  Weightbearing as tolerated 3.  Prescription for Medrol Dosepak 4.  Prescription for meloxicam 15 mg daily 5.  Recommend conservative treatments including rest, ice, elevation, and compression  6.  Return to clinic in 4 weeks      Brent M. Evans, DPM [facility]  Dr. Brent M. Evans, DPM    [address]                                        [place], [place] [address]                Office [phone]  Fax [phone]     

## 2020-08-21 ENCOUNTER — Ambulatory Visit: Payer: BC Managed Care – PPO | Admitting: Obstetrics & Gynecology

## 2020-09-10 ENCOUNTER — Other Ambulatory Visit: Payer: Self-pay

## 2020-09-10 ENCOUNTER — Ambulatory Visit (INDEPENDENT_AMBULATORY_CARE_PROVIDER_SITE_OTHER): Payer: BC Managed Care – PPO | Admitting: Obstetrics & Gynecology

## 2020-09-10 ENCOUNTER — Encounter: Payer: Self-pay | Admitting: Obstetrics & Gynecology

## 2020-09-10 VITALS — BP 150/90 | Ht 67.0 in | Wt 220.0 lb

## 2020-09-10 DIAGNOSIS — N644 Mastodynia: Secondary | ICD-10-CM | POA: Diagnosis not present

## 2020-09-10 NOTE — Progress Notes (Signed)
HPI:      [name] is a 60 y.o. [phone] who is postmenopausal, presents today for a problem visit.  She complains of  right sided breast heaviness, tenderness (esp UIQ), and feeling asymmetric on this  rightside which she first noticed  2 months ago .  It has not significantly changed.  Associated symptoms include none.  Denies nipple discharge or skin changes.  No fever.  She had MRI RIGHT Shoulder last year and feels that could have affected her breast tissue perhaps.  Prior Mammogram: 06/2020 normal. Prior breast problems: No Family History: Breast Cancer-relatedfamily history includes Breast cancer (age of onset: 44) in an other family member; Breast cancer (age of onset: 25) in her maternal aunt; Breast cancer (age of onset: 67) in her paternal aunt.  PMHx: She  has a past medical history of Anemia, Anxiety, Arthritis, BRCA negative (81/0175), Complication of anesthesia, Family history of breast cancer (08/2018), Family history of ovarian cancer, GERD (gastroesophageal reflux disease), Hyperlipidemia, Hypertension, Hypothyroidism, IFG (impaired fasting glucose) (03/17/5850), Metabolic syndrome (7/78/2423), Palpitations (2012), PONV (postoperative nausea and vomiting), Pre-diabetes, Rectal mass, and Thyroid disease. Also,  has a past surgical history that includes Dilation and curettage of uterus; Cesarean section; left foot surgery; Rectal exam under anesthesia (N/A, 11/30/2014); Anal fistulotomy (N/A, 11/30/2014); Endometrial ablation w/ novasure (05/29/2009); Colonoscopy (2014); Breast biopsy (Right, 09/08/2018); Total laparoscopic hysterectomy with bilateral salpingo oophorectomy (Bilateral, 05/14/2020); and Cystoscopy (N/A, 05/14/2020)., family history includes Alzheimer's disease in her father and mother; Breast cancer (age of onset: 51) in an other family member; Breast cancer (age of onset: 30) in her maternal aunt; Breast cancer (age of onset: 66) in her paternal aunt; Depression in her  father; Diverticulitis in her father; Heart attack in her maternal grandfather; Heart disease (age of onset: 78) in her mother; Hernia in her father; Hypertension in her brother and mother; Hypothyroidism in her daughter, daughter, sister, and sister; Ovarian cancer in her sister and sister; Stroke in her maternal grandfather and paternal grandmother; Uterine cancer (age of onset: 65) in her sister.,  reports that she has never smoked. She has never used smokeless tobacco. She reports that she does not drink alcohol and does not use drugs.  She has a current medication list which includes the following prescription(s): acetaminophen, flowflex covid-19 ag home test, dupixent, ibuprofen, levothyroxine, losartan, meloxicam, methocarbamol, methylprednisolone, neomycin-polymyxin-dexamethasone, ondansetron, and tramadol. Also, is allergic to scopolamine.  Review of Systems  Constitutional:  Negative for chills, fever and malaise/fatigue.  HENT:  Negative for congestion, sinus pain and sore throat.   Eyes:  Negative for blurred vision and pain.  Respiratory:  Negative for cough and wheezing.   Cardiovascular:  Negative for chest pain and leg swelling.  Gastrointestinal:  Negative for abdominal pain, constipation, diarrhea, heartburn, nausea and vomiting.  Genitourinary:  Negative for dysuria, frequency, hematuria and urgency.  Musculoskeletal:  Negative for back pain, joint pain, myalgias and neck pain.  Skin:  Negative for itching and rash.  Neurological:  Negative for dizziness, tremors and weakness.  Endo/Heme/Allergies:  Does not bruise/bleed easily.  Psychiatric/Behavioral:  Negative for depression. The patient is not nervous/anxious and does not have insomnia.    Objective: BP (!) 150/90   Ht 5' 7" (1.702 m)   Wt 220 lb (99.8 kg)   BMI 34.46 kg/m  Physical Exam Constitutional:      General: She is not in acute distress.    Appearance: Normal appearance. She is well-developed.   Genitourinary:  Breasts:  Right: No inverted nipple, mass, nipple discharge, skin change or tenderness.     Left: No inverted nipple, mass, nipple discharge, skin change or tenderness.  Cardiovascular:     Rate and Rhythm: Normal rate and regular rhythm.     Pulses: Normal pulses.     Heart sounds: Normal heart sounds. No murmur heard.   No friction rub. No gallop.  Pulmonary:     Effort: Pulmonary effort is normal.     Breath sounds: Normal breath sounds.  Chest:     Chest wall: No mass, tenderness or edema.  Musculoskeletal:        General: Normal range of motion.  Lymphadenopathy:     Upper Body:     Right upper body: No pectoral adenopathy.     Left upper body: No pectoral adenopathy.  Neurological:     Mental Status: She is alert and oriented to person, place, and time.  Skin:    General: Skin is warm and dry.     Findings: No abrasion, bruising, erythema, lesion or rash.  Psychiatric:        Speech: Speech normal.        Behavior: Behavior normal.        Judgment: Judgment normal.  Vitals reviewed.    ASSESSMENT/PLAN:    1. Breast pain, right - MMG UTD - Ambulatory referral to General Surgery to further assess and/or provide reassurance   Barnett Applebaum, MD, [facility], [facility] 09/10/2020  11:40 AM

## 2020-09-17 ENCOUNTER — Ambulatory Visit: Payer: BC Managed Care – PPO | Admitting: Podiatry

## 2020-09-26 ENCOUNTER — Ambulatory Visit: Payer: BC Managed Care – PPO | Admitting: General Surgery

## 2020-09-27 ENCOUNTER — Ambulatory Visit: Payer: BC Managed Care – PPO | Admitting: Podiatry

## 2020-10-08 ENCOUNTER — Encounter: Payer: Self-pay | Admitting: *Deleted

## 2020-10-09 ENCOUNTER — Telehealth: Payer: Self-pay | Admitting: Podiatry

## 2020-11-01 ENCOUNTER — Ambulatory Visit: Payer: BC Managed Care – PPO | Admitting: Podiatry

## 2020-11-05 ENCOUNTER — Other Ambulatory Visit: Payer: Self-pay

## 2020-11-05 ENCOUNTER — Ambulatory Visit (INDEPENDENT_AMBULATORY_CARE_PROVIDER_SITE_OTHER): Payer: BC Managed Care – PPO | Admitting: Podiatry

## 2020-11-05 DIAGNOSIS — M659 Synovitis and tenosynovitis, unspecified: Secondary | ICD-10-CM | POA: Diagnosis not present

## 2020-11-05 DIAGNOSIS — Z969 Presence of functional implant, unspecified: Secondary | ICD-10-CM

## 2020-11-05 DIAGNOSIS — M79672 Pain in left foot: Secondary | ICD-10-CM

## 2020-11-05 DIAGNOSIS — M25572 Pain in left ankle and joints of left foot: Secondary | ICD-10-CM | POA: Diagnosis not present

## 2020-11-05 NOTE — Progress Notes (Signed)
   HPI: 60 y.o. female presenting today for follow-up evaluation and questions associated to her left foot and ankle.  Patient states that before she retires in 1 year she would like to have the hardware removed from her left foot.  She would like to discuss the possibility of this.  She also presents today to have her new orthotics dispensed.  Past Medical History:  Diagnosis Date   Anemia    H/O   Anxiety    Arthritis    BRCA negative 08/2018   MyRisk neg except NTHL1 VUS   Complication of anesthesia    Family history of breast cancer 08/2018   IBIS=8.65/riskscore=7.9%   Family history of ovarian cancer    MyRisk neg except NTHL1 VUS   GERD (gastroesophageal reflux disease)    NO MEDS   Hyperlipidemia    Hypertension    Hypothyroidism    Hashimoto's per patient   IFG (impaired fasting glucose) 2/63/7858   Metabolic syndrome 8/50/2774   Palpitations 2012   Evaluated by Ida Rogue, MD, Holter moniter   PONV (postoperative nausea and vomiting)    Pre-diabetes    Rectal mass    Thyroid disease    unspecified hypothyroidism     Physical Exam: General: The patient is alert and oriented x3 in no acute distress.  Dermatology: Skin is warm, dry and supple bilateral lower extremities. Negative for open lesions or macerations.    Vascular: Palpable pedal pulses bilaterally. No edema or erythema noted. Capillary refill within normal limits.  Neurological: Epicritic and protective threshold grossly intact bilaterally.  There is some paresthesia with neuritis noted to the left foot  Musculoskeletal Exam: Range of motion within normal limits to all pedal and ankle joints bilateral. Muscle strength 5/5 in all groups bilateral.  Negative range of motion to the subtalar joint.  Today there is pain with direct palpation of the sinus tarsi of the left foot  Assessment: 1.  H/o isolated subtalar joint arthrodesis left 2.  Neuritis left foot 3.  Minimally symptomatic orthopedic  hardware left foot 4.  DJD left foot and ankle 5.  Sinus tarsitis left 6.  Possible lumbar radiculopathy LLE   Plan of Care:  1. Patient evaluated.  2.  Continue meloxicam 15 mg daily as needed 3.  Continue gabapentin 100 mg nightly to alleviate neuritis and pins-and-needles in her left forefoot 4.  Today we did discuss the possibility of removal of hardware.  The patient would like to go home and think about it 5.  New custom molded orthotics were dispensed today.  Break-in instructions provided  6.  Return to clinic as needed   Edrick Kins, DPM [facility]  Dr. Edrick Kins, DPM    [address], [place] [address]                Office [phone]  Fax [phone]

## 2020-11-26 NOTE — Telephone Encounter (Signed)
ERROR

## 2020-12-16 ENCOUNTER — Ambulatory Visit (INDEPENDENT_AMBULATORY_CARE_PROVIDER_SITE_OTHER): Payer: BC Managed Care – PPO | Admitting: Surgery

## 2020-12-16 ENCOUNTER — Other Ambulatory Visit: Payer: Self-pay

## 2020-12-16 ENCOUNTER — Encounter: Payer: Self-pay | Admitting: Surgery

## 2020-12-16 VITALS — BP 168/94 | HR 92 | Temp 98.5°F | Ht 67.0 in | Wt 215.2 lb

## 2020-12-16 DIAGNOSIS — N644 Mastodynia: Secondary | ICD-10-CM

## 2020-12-16 NOTE — Patient Instructions (Addendum)
Your Ultrasound is scheduled for October 7th, 2022 @ [facility] at 10:40 am.    If you have any concerns or questions, please feel free to call our office.  We will call you with the ultrasound results.     Breast Cyst A breast cyst is a sac in the breast that is filled with fluid. They are usually noncancerous (benign) and are common among women. Breast cysts are most often in the upper, outer portion of the breast. One or more cysts may develop. They form when fluid builds up inside the breast glands. There are several types of breast cysts. Some are too small to feel, but these can be seen with imaging tests such as an X-ray of the breast (mammogram) or ultrasound. Breast cysts do not increase your risk of breast cancer. They usually disappear after you no longer have a menstrual cycle (after menopause), unless you take artificial hormones (are on hormone therapy). What are the causes? This condition may be caused by: Blockage of tubes (ducts) in the breast glands, which leads to fluid buildup. Duct blockage may result from: Fibrocystic breast changes. This is a common, benign condition that occurs when women go through hormonal changes during the menstrual cycle. This is a common cause of multiple breast cysts. Overgrowth of breast tissue or breast glands. Scar tissue in the breast from previous surgery. Changes in certain female hormones (estrogen and progesterone). The exact cause of this condition is not known. What increases the risk? You may be more likely to develop breast cysts if you have not gone through menopause. What are the signs or symptoms? Symptoms of this condition include: Feeling one or more smooth, round, soft lumps (like grapes) in the breast that are easily movable. The lump or lumps may get bigger and more painful before your menstrual period and get smaller after your menstrual period. Breast discomfort or pain. How is this diagnosed? This condition  may be diagnosed based on: A physical exam. A cyst can be felt by your health care provider during this exam. Imaging tests, such as mammogram or ultrasound. Fluid may be removed from the cyst with a needle (fine-needle aspiration) and tested to make sure the cyst is not cancerous. How is this treated? Treatment may not be needed for this condition. Your health care provider may monitor the cyst to see if it goes away on its own. If the cyst is uncomfortable or gets bigger, or if you do not like how the cyst makes your breast look, you may need treatment. Treatment may include: Hormone therapy. Fine-needle aspiration to drain fluid from the cyst. There is a chance of the cyst coming back (recurring) after aspiration. Surgery to remove the cyst. Follow these instructions at home: Self-exams  Do a breast self-exam every month, or as often as directed. A breast self-exam involves: Comparing your breasts in the mirror. Looking for visible changes in your skin or nipples. Feeling for lumps or changes. Having many breast cysts may make it harder to feel for new lumps. Understand how your breasts normally look and feel, and write down any changes in your breasts. Tell your health care provider about any changes. Eating and drinking Follow instructions from your health care provider about eating and drinking restrictions. Drink enough fluid to keep your urine pale yellow. Avoid caffeine. Cut down on salt (sodium) in what you eat and drink, especially before your menstrual period. Too much sodium can cause fluid buildup, breast swelling, and discomfort. General  instructions See your health care provider regularly. Get a yearly physical exam. If you are 80-56 years old, get a clinical breast exam every 1-3 years. After the age of 48 years, get this exam every year. Get mammograms as often as directed. Take over-the-counter and prescription medicines only as told by your health care provider. Wear a  supportive bra, especially when exercising. Keep all follow-up visits as told by your health care provider. This is important. Contact a health care provider if: You feel, or think you feel, a lump in your breast. You notice that both breasts look or feel different than usual. Your breast is still causing pain after your menstrual period is over. You find new lumps or bumps that were not there before. You feel lumps in your armpit. Get help right away if: You have severe pain, tenderness, redness, or warmth in your breast. You have fluid or blood leaking from your nipple. Your breast lump becomes hard and painful. You notice dimpling or wrinkling of the breast or nipple. Summary A breast cyst is a sac in the breast that is filled with fluid. Treatment may not be needed for this condition. If the cyst is uncomfortable or gets bigger, or if you do not like how the cyst makes your breast look, you may need treatment. This information is not intended to replace advice given to you by your health care provider. Make sure you discuss any questions you have with your health care provider. Document Revised: 07/19/2018 Document Reviewed: 07/19/2018 Elsevier Patient Education  [place].

## 2020-12-16 NOTE — Progress Notes (Signed)
12/16/2020  Reason for Visit:  Right breast pain  Referring Provider:  Teresa Coombs, MD  History of Present Illness: Desiree Walker is a 60 y.o. female presenting for evaluation of right breast tenderness.  She reports having right shoulder surgery about 2-3 years ago and also recently has a rotator cuff tear in the right shoulder, found on MRI on 10/22/19.  She reports she's had breast soreness even before the shoulder injury, but after the injury, it has become more pronounced.  She also recently had a hysterectomy and oopherectomy with Dr. Merleen Nicely on 05/14/20.  She does have a family history of breast cancer and ovarian cancer.  She had a right breast biopsy on 09/08/2018 after screening mammogram found possible mass in the upper inner quadrant.  The biopsy showed benign fibroadipose tissue with area of fat necrosis and changes of a ruptured cyst.  She had a follow up right breast diagnostic mammogram on 06/19/2019 for biopsy site pain and this was negative for malignancy or any suspicious changes.    The patient reports that overall, her discomfort is in the upper outer quadrant going towards her shoulder, and also in the central to medial side of the right breast.  She denies any nipple drainage or palpable masses or skin changes.  Past Medical History: Past Medical History:  Diagnosis Date   Anemia    H/O   Anxiety    Arthritis    BRCA negative 08/2018   MyRisk neg except NTHL1 VUS   Complication of anesthesia    Family history of breast cancer 08/2018   IBIS=8.65/riskscore=7.9%   Family history of ovarian cancer    MyRisk neg except NTHL1 VUS   GERD (gastroesophageal reflux disease)    NO MEDS   Hyperlipidemia    Hypertension    Hypothyroidism    Hashimoto's per patient   IFG (impaired fasting glucose) 9/93/5701   Metabolic syndrome 7/79/3903   Palpitations 2012   Evaluated by Ida Rogue, MD, Holter moniter   PONV (postoperative nausea and vomiting)    Pre-diabetes     Rectal mass    Thyroid disease    unspecified hypothyroidism     Past Surgical History: Past Surgical History:  Procedure Laterality Date   ANAL FISTULOTOMY N/A 11/30/2014   Procedure: ANAL FISTULOTOMY;  Surgeon: Dia Crawford III, MD;  Location: [facility] ORS;  Service: General;  Laterality: N/A;   BREAST BIOPSY Right 09/08/2018   affirm bx of mass, coil clip, ORGANIZING FAT NECROSIS AND   CESAREAN SECTION     COLONOSCOPY  2014   CYSTOSCOPY N/A 05/14/2020   Procedure: CYSTOSCOPY;  Surgeon: Gae Dry, MD;  Location: [facility] OR[facility];  Service: Gynecology;  Laterality: N/A;   DILATION AND CURETTAGE OF UTERUS     ENDOMETRIAL ABLATION W/ NOVASURE  05/29/2009   Dr Kenton Kingfisher   left foot surgery     RECTAL EXAM UNDER ANESTHESIA N/A 11/30/2014   Procedure: RECTAL EXAM UNDER ANESTHESIA;  Surgeon: Dia Crawford III, MD;  Location: [facility] OR[facility];  Service: General;  Laterality: N/A;   TOTAL LAPAROSCOPIC HYSTERECTOMY WITH BILATERAL SALPINGO OOPHORECTOMY Bilateral 05/14/2020   LAPAROSCOPIC SUPRACERVICAL HYSTERECTOMY WITH BILATERAL SALPINGO OOPHORECTOMY;  Surgeon: Gae Dry, MD;  Location: [facility];  Service: Gynecology;  Laterality: Bilateral;    Home Medications: Prior to Admission medications   Medication Sig Start Date End Date Taking? Authorizing Provider  acetaminophen (TYLENOL) 500 MG tablet Take 500 mg by mouth every 6 (six) hours as needed.    [provider]  COVID-19 At Home Antigen Test [facility] COVID-19 AG HOME TEST) KIT See admin instructions. 05/01/20   [provider]  DUPIXENT 300 MG/2ML SOPN Inject 300 mg into the skin every 14 (fourteen) days. 04/01/20   [provider]  ibuprofen (ADVIL) 200 MG tablet Take by mouth.    [provider]  levothyroxine (SYNTHROID) 112 MCG tablet TAKE ONE TABLET DAILY. EVERY SUNDAY TAKE AN EXTRA ONE-HALF TABLET. Patient taking differently: Take 112-168 mcg by mouth See admin instructions. Take 1.5 tablets (168 mcg) by mouth on  Sundays & take 1 tablet (112 mcg) by mouth on all other days. 01/18/19   Hubbard Hartshorn, FNP  losartan (COZAAR) 100 MG tablet Take 100 mg by mouth daily. 05/21/20   [provider]  meloxicam (MOBIC) 15 MG tablet Take 1 tablet (15 mg total) by mouth daily. 08/20/20   Edrick Kins, DPM  methocarbamol (ROBAXIN) 750 MG tablet  02/29/20   [provider]  methylPREDNISolone (MEDROL DOSEPAK) 4 MG TBPK tablet 6 day dose pack - take as directed 08/20/20   Edrick Kins, DPM  neomycin-polymyxin-dexamethasone (MAXITROL) 0.1 % ophthalmic suspension PLACE 1 DROP IN RIGHT EYE 4 TIMES A DAY X 10 DAYS 04/23/20   [provider]  ondansetron (ZOFRAN-ODT) 8 MG disintegrating tablet  02/29/20   [provider]  traMADol (ULTRAM) 50 MG tablet TAKE 1 TABLET BY MOUTH EVERY 6 HOURS *INS ONLY COVERS 7 DAYS* 04/05/20   [provider]    Allergies: Allergies  Allergen Reactions   Scopolamine Other (See Comments)    "[place]"    Social History:  reports that she has never smoked. She has never used smokeless tobacco. She reports that she does not drink alcohol and does not use drugs.   Family History: Family History  Problem Relation Age of Onset   Hypertension Mother    Heart disease Mother 27       "blockages"   Alzheimer's disease Mother    Diverticulitis Father    Hernia Father    Depression Father    Alzheimer's disease Father    Hypothyroidism Sister    Uterine cancer Sister 43   Ovarian cancer Sister    Breast cancer Maternal Aunt 32   Hypothyroidism Daughter    Hypertension Brother    Stroke Maternal Grandfather    Heart attack Maternal Grandfather    Stroke Paternal Grandmother    Hypothyroidism Sister    Ovarian cancer Sister    Hypothyroidism Daughter    Breast cancer Paternal Aunt 23   Breast cancer Other 30    Review of Systems: Review of Systems  Constitutional:  Negative for chills and fever.  HENT:  Negative for  hearing loss.   Respiratory:  Negative for shortness of breath.   Cardiovascular:  Negative for chest pain.  Gastrointestinal:  Negative for abdominal pain, nausea and vomiting.  Genitourinary:  Negative for dysuria.  Musculoskeletal:  Positive for joint pain.       Right breast pain  Skin:  Negative for rash.  Neurological:  Negative for dizziness.  Psychiatric/Behavioral:  Negative for depression.    Physical Exam BP (!) 168/94   Pulse 92   Temp 98.5 F (36.9 C) (Oral)   Ht 5' 7"  (1.702 m)   Wt 215 lb 3.2 oz (97.6 kg)   SpO2 98%   BMI 33.71 kg/m  CONSTITUTIONAL: No acute distress, well nourished. HEENT:  Normocephalic, atraumatic, extraocular motion intact.  NECK: Trachea is midline, and there is no jugular venous distension.  RESPIRATORY:  Lungs are clear, and breath sounds are equal bilaterally. Normal respiratory effort without pathologic use of accessory muscles. CARDIOVASCULAR: Heart is regular without murmurs, gallops, or rubs. BREAST:  Right breast has a biopsy scar in the upper outer quadrant which is well healed.  The patient has tenderness on palpation along the upper outer quadrant around 11 o'clock going towards the shoulder, and then also soreness in the central to medial breast, in both upper and lower inner quadrants centrally.  There is no soreness in the lateral aspect of the breast.  No palpable masses, no skin or nipple changes, and no right axillary lymphadenopathy.  Left breast exam does not reveal any skin changes, palpable masses, tenderness, or nipple changes.  No left axillary lymphadenopathy. MUSCULOSKELETAL:  Normal muscle strength and tone in all four extremities.  No peripheral edema or cyanosis. SKIN: Skin turgor is normal. There are no pathologic skin lesions.  NEUROLOGIC:  Motor and sensation is grossly normal.  Cranial nerves are grossly intact. PSYCH:  Alert and oriented to person, place and time. Affect is normal.  Laboratory Analysis: No results  found for this or any previous visit (from the past 24 hour(s)).  Imaging: Bilateral Screening Mammogram 06/27/20: FINDINGS: There are no findings suspicious for malignancy. The images were evaluated with computer-aided detection.   IMPRESSION: No mammographic evidence of malignancy. A result letter of this screening mammogram will be mailed directly to the patient.   RECOMMENDATION: Screening mammogram in one year. (Code:SM-B-01Y)   BI-RADS CATEGORY  1: Negative.    Assessment and Plan: This is a 60 y.o. female with what appears to be chronic right breast pain.  --Discussed with the patient that her most recent mammograms have not shown any masses or suspicious changes, and her biopsy in 2020 showed possible ruptured cyst.  Discussed with her that the source of her pain could be from fibrocystic changes in the breast, or also from potential scarring from her shoulder injury and surgery years ago.  Will order right breast diagnostic mammogram and ultrasound to further evaluate, but have low suspicion at this point.  She was also worried that her MRI could have caused any issues with the biopsy clip placed in 2020 but discussed with her that the clips are MRI compatible. --Discussed with her some conservative measures to take, such as wearing a better supporting bra, NSAID use for pain as needed, warm compresses vs ice packs, and trying Evening Primrose Oil.   --Will call the patient back with the results of her mammogram and ultrasound and if any procedures/follow up is needed based on the results.  Face-to-face time spent with the patient and care providers was 60 minutes, with more than 50% of the time spent counseling, educating, and coordinating care of the patient.     Melvyn Neth, [facility]

## 2020-12-20 ENCOUNTER — Ambulatory Visit
Admission: RE | Admit: 2020-12-20 | Discharge: 2020-12-20 | Disposition: A | Discharge status: Home / Self Care | Payer: BC Managed Care – PPO | Source: Ambulatory Visit | Attending: Surgery | Admitting: Surgery

## 2020-12-20 ENCOUNTER — Other Ambulatory Visit: Payer: Self-pay

## 2020-12-20 DIAGNOSIS — N644 Mastodynia: Secondary | ICD-10-CM

## 2020-12-23 ENCOUNTER — Other Ambulatory Visit: Payer: Self-pay | Admitting: Surgery

## 2020-12-23 DIAGNOSIS — R2231 Localized swelling, mass and lump, right upper limb: Secondary | ICD-10-CM

## 2020-12-23 DIAGNOSIS — R928 Other abnormal and inconclusive findings on diagnostic imaging of breast: Secondary | ICD-10-CM

## 2020-12-23 NOTE — Progress Notes (Signed)
12/23/20  Called patient today and discussed her mammogram and ultrasound results, showing a 5.1 x 3.2 x 1.9 cm mass in the axilla which could be a fatty neoplasm.  Orders for biopsy have been placed and discussed with her that she should be contacted this week to schedule biopsy of this mass.  I will call her with the results of biopsy and further plans depending on the results.  Olean Ree, MD

## 2020-12-25 ENCOUNTER — Encounter: Payer: Self-pay | Admitting: General Surgery

## 2021-01-04 ENCOUNTER — Other Ambulatory Visit: Payer: Self-pay

## 2021-01-04 ENCOUNTER — Ambulatory Visit
Admission: RE | Admit: 2021-01-04 | Discharge: 2021-01-04 | Disposition: A | Discharge status: Home / Self Care | Payer: BC Managed Care – PPO | Source: Ambulatory Visit | Attending: Emergency Medicine | Admitting: Emergency Medicine

## 2021-01-04 VITALS — BP 158/86 | HR 97 | Temp 97.9°F | Resp 18

## 2021-01-04 DIAGNOSIS — H9201 Otalgia, right ear: Secondary | ICD-10-CM | POA: Diagnosis not present

## 2021-01-04 DIAGNOSIS — I10 Essential (primary) hypertension: Secondary | ICD-10-CM | POA: Diagnosis not present

## 2021-01-04 DIAGNOSIS — H6981 Other specified disorders of Eustachian tube, right ear: Secondary | ICD-10-CM | POA: Diagnosis not present

## 2021-01-04 DIAGNOSIS — H6991 Unspecified Eustachian tube disorder, right ear: Secondary | ICD-10-CM

## 2021-01-04 MED ORDER — PREDNISONE 10 MG (21) PO TBPK: ORAL_TABLET | Freq: Every day | ORAL | 0 refills | Status: DC

## 2021-01-04 NOTE — ED Provider Notes (Signed)
Roderic Palau    CSN: [id number] Arrival date & time: 01/04/21  0818      History   Chief Complaint Chief Complaint  Patient presents with   Otalgia    HPI ZALIYAH MEIKLE is a 60 y.o. female.  Patient presents with 5-day history of right ear pain.  No drainage or change in her hearing.  The pain feels like fullness with occasional sharp quick pains.  No fever, rash, sore throat, cough, shortness of breath, or other symptoms.  OTC treatment attempted at home.  Her medical history includes hypertension.  The history is provided by the patient and medical records.   Past Medical History:  Diagnosis Date   Anemia    H/O   Anxiety    Arthritis    BRCA negative 08/2018   MyRisk neg except NTHL1 VUS   Complication of anesthesia    Family history of breast cancer 08/2018   IBIS=8.65/riskscore=7.9%   Family history of ovarian cancer    MyRisk neg except NTHL1 VUS   GERD (gastroesophageal reflux disease)    NO MEDS   Hyperlipidemia    Hypertension    Hypothyroidism    Hashimoto's per patient   IFG (impaired fasting glucose) 07/08/9561   Metabolic syndrome 8/75/6433   Palpitations 2012   Evaluated by Ida Rogue, MD, Holter moniter   PONV (postoperative nausea and vomiting)    Pre-diabetes    Rectal mass    Thyroid disease    unspecified hypothyroidism    Patient Active Problem List   Diagnosis Date Noted   Hypothyroidism due to Hashimoto's thyroiditis 06/12/2020   IGT (impaired glucose tolerance) 06/12/2020   Irritant contact dermatitis 06/12/2020   S/P laparoscopic supracervical hysterectomy 05/27/2020   Fibroid uterus 05/14/2020   Rotator cuff tendinitis, right 11/06/2019   Traumatic complete tear of right rotator cuff 11/06/2019   COVID-19 virus infection 03/14/2019   S/P breast biopsy, right 01/12/2019   Breast pain, right 01/12/2019   Family history of breast cancer 06/02/2018   Family history of ovarian cancer 06/02/2018   Eczema 06/10/2017    History of uterine fibroid 29/51/8841   Metabolic syndrome 66/08/3014   Medication monitoring encounter 10/13/2016   Pressure in chest 12/17/2015   Anxiety disorder 12/17/2015   Obesity 12/17/2015   Sleep apnea 08/29/2015   Snoring 08/27/2015   Vitamin D deficiency 08/19/2015   Leg cramps 08/05/2015   IFG (impaired fasting glucose) 08/05/2015   Anal fistula    Other specified symptoms and signs involving the digestive system and abdomen 10/31/2014   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   Bladder neoplasm of uncertain malignant potential 06/09/2013   Female genuine stress incontinence 06/05/2013   Incomplete bladder emptying 06/05/2013   Urge incontinence 06/05/2013   Dysuria 06/05/2013   Other chronic cystitis without hematuria 06/05/2013   Retention of urine 06/05/2013   RLQ abdominal pain 07/27/2012   Palpitations 09/11/2010   Hypothyroidism, adult 05/10/2009   Hyperlipidemia LDL goal <130 05/10/2009   Hypertension goal BP (blood pressure) < 140/90 05/10/2009    Past Surgical History:  Procedure Laterality Date   ANAL FISTULOTOMY N/A 11/30/2014   Procedure: ANAL FISTULOTOMY;  Surgeon: Dia Crawford III, MD;  Location: [facility];  Service: General;  Laterality: N/A;   BREAST BIOPSY Right 09/08/2018   affirm bx of mass, coil clip, ORGANIZING FAT NECROSIS AND   CESAREAN SECTION     COLONOSCOPY  2014   CYSTOSCOPY N/A 05/14/2020   Procedure: CYSTOSCOPY;  Surgeon: Kenton Kingfisher,  Linton Ham, MD;  Location: [facility] OR[facility];  Service: Gynecology;  Laterality: N/A;   DILATION AND CURETTAGE OF UTERUS     ENDOMETRIAL ABLATION W/ NOVASURE  05/29/2009   Dr Kenton Kingfisher   left foot surgery     RECTAL EXAM UNDER ANESTHESIA N/A 11/30/2014   Procedure: RECTAL EXAM UNDER ANESTHESIA;  Surgeon: Dia Crawford III, MD;  Location: [facility] ORS;  Service: General;  Laterality: N/A;   TOTAL LAPAROSCOPIC HYSTERECTOMY WITH BILATERAL SALPINGO OOPHORECTOMY Bilateral 05/14/2020   LAPAROSCOPIC SUPRACERVICAL HYSTERECTOMY WITH  BILATERAL SALPINGO OOPHORECTOMY;  Surgeon: Gae Dry, MD;  Location: [facility];  Service: Gynecology;  Laterality: Bilateral;    OB History     Gravida  4   Para  3   Term  3   Preterm      AB  1   Living  3      SAB  1   IAB      Ectopic      Multiple      Live Births  3        Obstetric Comments  1st Menstrual Cycle:  11 1st Pregnancy:  20          Home Medications    Prior to Admission medications   Medication Sig Start Date End Date Taking? Authorizing Provider  predniSONE (STERAPRED UNI-PAK 21 TAB) 10 MG (21) TBPK tablet Take by mouth daily. As directed 01/04/21  Yes Sharion Balloon, NP  acetaminophen (TYLENOL) 500 MG tablet Take 500 mg by mouth every 6 (six) hours as needed.    [provider]  COVID-19 At Home Antigen Test [facility] COVID-19 AG HOME TEST) KIT See admin instructions. 05/01/20   [provider]  DUPIXENT 300 MG/2ML SOPN Inject 300 mg into the skin every 14 (fourteen) days. 04/01/20   [provider]  ibuprofen (ADVIL) 200 MG tablet Take by mouth.    [provider]  levothyroxine (SYNTHROID) 112 MCG tablet TAKE ONE TABLET DAILY. EVERY SUNDAY TAKE AN EXTRA ONE-HALF TABLET. Patient taking differently: Take 112-168 mcg by mouth See admin instructions. Take 1.5 tablets (168 mcg) by mouth on Sundays & take 1 tablet (112 mcg) by mouth on all other days. 01/18/19   Hubbard Hartshorn, FNP  losartan (COZAAR) 100 MG tablet Take 100 mg by mouth daily. 05/21/20   [provider]  meloxicam (MOBIC) 15 MG tablet Take 1 tablet (15 mg total) by mouth daily. 08/20/20   Edrick Kins, DPM  methocarbamol (ROBAXIN) 750 MG tablet  02/29/20   [provider]  neomycin-polymyxin-dexamethasone (MAXITROL) 0.1 % ophthalmic suspension PLACE 1 DROP IN RIGHT EYE 4 TIMES A DAY X 10 DAYS 04/23/20   [provider]  ondansetron (ZOFRAN-ODT) 8 MG disintegrating tablet  02/29/20   [provider]  traMADol  (ULTRAM) 50 MG tablet TAKE 1 TABLET BY MOUTH EVERY 6 HOURS *INS ONLY COVERS 7 DAYS* 04/05/20   [provider]    Family History Family History  Problem Relation Age of Onset   Hypertension Mother    Heart disease Mother 73       "blockages"   Alzheimer's disease Mother    Diverticulitis Father    Hernia Father    Depression Father    Alzheimer's disease Father    Hypothyroidism Sister    Uterine cancer Sister 40   Ovarian cancer Sister    Breast cancer Maternal Aunt 28   Hypothyroidism Daughter    Hypertension Brother  Stroke Maternal Grandfather    Heart attack Maternal Grandfather    Stroke Paternal Grandmother    Hypothyroidism Sister    Ovarian cancer Sister    Hypothyroidism Daughter    Breast cancer Paternal Aunt 53   Breast cancer Other 52    Social History Social History   Tobacco Use   Smoking status: Never   Smokeless tobacco: Never  Vaping Use   Vaping Use: Never used  Substance Use Topics   Alcohol use: No    Alcohol/week: 0.0 standard drinks   Drug use: No     Allergies   Scopolamine   Review of Systems Review of Systems  Constitutional:  Negative for chills and fever.  HENT:  Positive for ear pain. Negative for ear discharge, hearing loss and sore throat.   Respiratory:  Negative for cough and shortness of breath.   Cardiovascular:  Negative for chest pain and palpitations.  Gastrointestinal:  Negative for abdominal pain, diarrhea and vomiting.  Skin:  Negative for color change and rash.  All other systems reviewed and are negative.   Physical Exam Triage Vital Signs ED Triage Vitals  Enc Vitals Group     BP      Pulse      Resp      Temp      Temp src      SpO2      Weight      Height      Head Circumference      Peak Flow      Pain Score      Pain Loc      Pain Edu?      Excl. in [place]?    No data found.  Updated Vital Signs BP (!) 158/86 (BP Location: Left Arm)   Pulse 97   Temp 97.9 F (36.6 C) (Oral)    Resp 18   SpO2 97%   Visual Acuity Right Eye Distance:   Left Eye Distance:   Bilateral Distance:    Right Eye Near:   Left Eye Near:    Bilateral Near:     Physical Exam Vitals and nursing note reviewed.  Constitutional:      General: She is not in acute distress.    Appearance: She is well-developed. She is not ill-appearing.  HENT:     Head: Normocephalic and atraumatic.     Right Ear: Tympanic membrane and ear canal normal.     Left Ear: Tympanic membrane and ear canal normal.     Nose: Nose normal.     Mouth/Throat:     Mouth: Mucous membranes are moist.     Pharynx: Oropharynx is clear.  Eyes:     Conjunctiva/sclera: Conjunctivae normal.  Cardiovascular:     Rate and Rhythm: Normal rate and regular rhythm.     Heart sounds: Normal heart sounds.  Pulmonary:     Effort: Pulmonary effort is normal. No respiratory distress.     Breath sounds: Normal breath sounds.  Abdominal:     Palpations: Abdomen is soft.     Tenderness: There is no abdominal tenderness.  Musculoskeletal:     Cervical back: Neck supple.  Skin:    General: Skin is warm and dry.  Neurological:     General: No focal deficit present.     Mental Status: She is alert.  Psychiatric:        Mood and Affect: Mood normal.        Behavior: Behavior normal.  UC Treatments / Results  Labs (all labs ordered are listed, but only abnormal results are displayed) Labs Reviewed - No data to display  EKG   Radiology No results found.  Procedures Procedures (including critical care time)  Medications Ordered in UC Medications - No data to display  Initial Impression / Assessment and Plan / UC Course  I have reviewed the triage vital signs and the nursing notes.  Pertinent labs & imaging results that were available during my care of the patient were reviewed by me and considered in my medical decision making (see chart for details).  Right otalgia and eustachian tube dysfunction.  Elevated  blood pressure reading with hypertension.  Treating with prednisone taper.  Also discussed plain Mucinex, ibuprofen, Flonase nasal spray.  Instructed patient to follow-up with her PCP or an ENT if her symptoms are not improving.  Also discussed that her blood pressure is elevated today and needs to be rechecked by her PCP in 2 to 4 weeks.  Education provided on managing hypertension.  Patient agrees to plan of care.   Final Clinical Impressions(s) / UC Diagnoses   Final diagnoses:  Acute dysfunction of right eustachian tube  Acute otalgia, right  Elevated blood pressure reading in office with diagnosis of hypertension     Discharge Instructions      Take the prednisone as directed.  You can also take plain over-the-counter Mucinex, ibuprofen, and Flonase nasal spray as directed.  Follow up with your primary care provider or an ENT if your symptoms are not improving.    Your blood pressure is elevated today at 158/86.  Please have this rechecked by your primary care provider in 2-4 weeks.          ED Prescriptions     Medication Sig Dispense Auth. Provider   predniSONE (STERAPRED UNI-PAK 21 TAB) 10 MG (21) TBPK tablet Take by mouth daily. As directed 21 tablet Sharion Balloon, NP      PDMP not reviewed this encounter.   Sharion Balloon, NP 01/04/21 [phone]

## 2021-01-04 NOTE — Discharge Instructions (Addendum)
Take the prednisone as directed.  You can also take plain over-the-counter Mucinex, ibuprofen, and Flonase nasal spray as directed.  Follow up with your primary care provider or an ENT if your symptoms are not improving.    Your blood pressure is elevated today at 158/86.  Please have this rechecked by your primary care provider in 2-4 weeks.

## 2021-01-04 NOTE — ED Triage Notes (Signed)
Pt here with right ear pain x 5 days. Denies dizziness, but endorses jaw tenderness on that side.

## 2021-01-08 ENCOUNTER — Ambulatory Visit
Admission: RE | Admit: 2021-01-08 | Discharge: 2021-01-08 | Disposition: A | Discharge status: Home / Self Care | Payer: BC Managed Care – PPO | Source: Ambulatory Visit | Attending: Surgery | Admitting: Surgery

## 2021-01-08 ENCOUNTER — Other Ambulatory Visit: Payer: Self-pay

## 2021-01-08 DIAGNOSIS — R2231 Localized swelling, mass and lump, right upper limb: Secondary | ICD-10-CM | POA: Diagnosis present

## 2021-01-08 DIAGNOSIS — R928 Other abnormal and inconclusive findings on diagnostic imaging of breast: Secondary | ICD-10-CM

## 2021-01-09 LAB — SURGICAL PATHOLOGY

## 2021-01-10 NOTE — Progress Notes (Signed)
01/10/21  Attempted call today to discuss pathology results from right axillary biopsy.  Voicemail left and will attempt call back later.  Of note, the biopsy showed benign fibroadipose tissue and lymph node tissue.  Likely this is a lipoma and the needle got part of a lymph node during the biopsy.  No evidence of malignancy in the samples obtained.  Desiree Ree, MD

## 2021-01-12 NOTE — Progress Notes (Signed)
01/10/21  Was able to get in touch with patient this afternoon and discuss the pathology results from her right axillary mass biopsy.  Results show benign fibroadipose tissue, no evidence of malignancy.  Discussed with her that as a precaution, we can repeat right axillary U/S in 6 months to re-evaluate.  She's in agreement.  Olean Ree, MD

## 2021-01-13 ENCOUNTER — Telehealth: Payer: Self-pay

## 2021-01-13 HISTORY — PX: BREAST BIOPSY: SHX20

## 2021-01-13 NOTE — Telephone Encounter (Signed)
Per Dr.Piscoya -patient placed in recall box for follow up Ultrasound and to see him after.

## 2021-02-17 ENCOUNTER — Encounter: Payer: Self-pay | Admitting: Emergency Medicine

## 2021-02-17 ENCOUNTER — Ambulatory Visit
Admission: EM | Admit: 2021-02-17 | Discharge: 2021-02-17 | Disposition: A | Discharge status: Home / Self Care | Payer: BC Managed Care – PPO | Attending: Urgent Care | Admitting: Urgent Care

## 2021-02-17 DIAGNOSIS — H9201 Otalgia, right ear: Secondary | ICD-10-CM

## 2021-02-17 DIAGNOSIS — I10 Essential (primary) hypertension: Secondary | ICD-10-CM

## 2021-02-17 DIAGNOSIS — H6983 Other specified disorders of Eustachian tube, bilateral: Secondary | ICD-10-CM | POA: Diagnosis not present

## 2021-02-17 DIAGNOSIS — R21 Rash and other nonspecific skin eruption: Secondary | ICD-10-CM

## 2021-02-17 MED ORDER — CETIRIZINE HCL 10 MG PO TABS: 10.0000 mg | ORAL_TABLET | Freq: Every day | ORAL | 0 refills | Status: DC

## 2021-02-17 MED ORDER — TRIAMCINOLONE ACETONIDE 0.1 % EX CREA: 1.0000 "application " | TOPICAL_CREAM | Freq: Two times a day (BID) | CUTANEOUS | 0 refills | Status: DC

## 2021-02-17 MED ORDER — PSEUDOEPHEDRINE HCL 30 MG PO TABS: 30.0000 mg | ORAL_TABLET | Freq: Three times a day (TID) | ORAL | 0 refills | Status: DC | PRN

## 2021-02-17 MED ORDER — NEOMYCIN-POLYMYXIN-HC 3.5-10000-1 OT SUSP: 4.0000 [drp] | Freq: Three times a day (TID) | OTIC | 0 refills | Status: DC

## 2021-02-17 MED ORDER — FLUTICASONE PROPIONATE 50 MCG/ACT NA SUSP: 2.0000 | Freq: Every day | NASAL | 12 refills | Status: DC

## 2021-02-17 NOTE — ED Provider Notes (Signed)
Desiree Walker   MRN: [id number] DOB: 09-Mar-1961  Subjective:   Desiree Walker is a 60 y.o. female presenting for 4-day history of acute onset recurrent bilateral and intermittent ear fullness, ear popping, dizziness, ringing.  She thought it was earwax and therefore tried to use a Q-tip aggressively to remove the earwax and is now feeling pain over the right ear.  She also has a rash from using gloves that she is allergic to.  Has had itching of her hands.  No bleeding, tenderness, redness or swelling.  No current facility-administered medications for this encounter.  Current Outpatient Medications:    acetaminophen (TYLENOL) 500 MG tablet, Take 500 mg by mouth every 6 (six) hours as needed., Disp: , Rfl:    COVID-19 At Home Antigen Test ([facility] COVID-19 AG HOME TEST) KIT, See admin instructions., Disp: , Rfl:    DUPIXENT 300 MG/2ML SOPN, Inject 300 mg into the skin every 14 (fourteen) days., Disp: , Rfl:    ibuprofen (ADVIL) 200 MG tablet, Take by mouth., Disp: , Rfl:    levothyroxine (SYNTHROID) 112 MCG tablet, TAKE ONE TABLET DAILY. EVERY SUNDAY TAKE AN EXTRA ONE-HALF TABLET. (Patient taking differently: Take 112-168 mcg by mouth See admin instructions. Take 1.5 tablets (168 mcg) by mouth on Sundays & take 1 tablet (112 mcg) by mouth on all other days.), Disp: 90 tablet, Rfl: 3   losartan (COZAAR) 100 MG tablet, Take 100 mg by mouth daily., Disp: , Rfl:    meloxicam (MOBIC) 15 MG tablet, Take 1 tablet (15 mg total) by mouth daily., Disp: 30 tablet, Rfl: 1   methocarbamol (ROBAXIN) 750 MG tablet, , Disp: , Rfl:    neomycin-polymyxin-dexamethasone (MAXITROL) 0.1 % ophthalmic suspension, PLACE 1 DROP IN RIGHT EYE 4 TIMES A DAY X 10 DAYS, Disp: , Rfl:    ondansetron (ZOFRAN-ODT) 8 MG disintegrating tablet, , Disp: , Rfl:    predniSONE (STERAPRED UNI-PAK 21 TAB) 10 MG (21) TBPK tablet, Take by mouth daily. As directed, Disp: 21 tablet, Rfl: 0   traMADol (ULTRAM) 50 MG tablet, TAKE  1 TABLET BY MOUTH EVERY 6 HOURS *INS ONLY COVERS 7 DAYS*, Disp: , Rfl:    Allergies  Allergen Reactions   Scopolamine Other (See Comments)    "FELT LIKE I COULDN'T BREATHE"    Past Medical History:  Diagnosis Date   Anemia    H/O   Anxiety    Arthritis    BRCA negative 08/2018   MyRisk neg except NTHL1 VUS   Complication of anesthesia    Family history of breast cancer 08/2018   IBIS=8.65/riskscore=7.9%   Family history of ovarian cancer    MyRisk neg except NTHL1 VUS   GERD (gastroesophageal reflux disease)    NO MEDS   Hyperlipidemia    Hypertension    Hypothyroidism    Hashimoto's per patient   IFG (impaired fasting glucose) 6/71/2458   Metabolic syndrome 0/99/8338   Palpitations 2012   Evaluated by Ida Rogue, MD, Holter moniter   PONV (postoperative nausea and vomiting)    Pre-diabetes    Rectal mass    Thyroid disease    unspecified hypothyroidism     Past Surgical History:  Procedure Laterality Date   ANAL FISTULOTOMY N/A 11/30/2014   Procedure: ANAL FISTULOTOMY;  Surgeon: Dia Crawford III, MD;  Location: [facility];  Service: General;  Laterality: N/A;   BREAST BIOPSY Right 09/08/2018   affirm bx of mass, coil clip, ORGANIZING FAT NECROSIS AND   CESAREAN SECTION  COLONOSCOPY  2014   CYSTOSCOPY N/A 05/14/2020   Procedure: CYSTOSCOPY;  Surgeon: Gae Dry, MD;  Location: [facility];  Service: Gynecology;  Laterality: N/A;   DILATION AND CURETTAGE OF UTERUS     ENDOMETRIAL ABLATION W/ NOVASURE  05/29/2009   Dr Kenton Kingfisher   left foot surgery     RECTAL EXAM UNDER ANESTHESIA N/A 11/30/2014   Procedure: RECTAL EXAM UNDER ANESTHESIA;  Surgeon: Dia Crawford III, MD;  Location: [facility];  Service: General;  Laterality: N/A;   TOTAL LAPAROSCOPIC HYSTERECTOMY WITH BILATERAL SALPINGO OOPHORECTOMY Bilateral 05/14/2020   LAPAROSCOPIC SUPRACERVICAL HYSTERECTOMY WITH BILATERAL SALPINGO OOPHORECTOMY;  Surgeon: Gae Dry, MD;  Location: [facility];  Service: Gynecology;   Laterality: Bilateral;    Family History  Problem Relation Age of Onset   Hypertension Mother    Heart disease Mother 90       "blockages"   Alzheimer's disease Mother    Diverticulitis Father    Hernia Father    Depression Father    Alzheimer's disease Father    Hypothyroidism Sister    Uterine cancer Sister 74   Ovarian cancer Sister    Breast cancer Maternal Aunt 60   Hypothyroidism Daughter    Hypertension Brother    Stroke Maternal Grandfather    Heart attack Maternal Grandfather    Stroke Paternal Grandmother    Hypothyroidism Sister    Ovarian cancer Sister    Hypothyroidism Daughter    Breast cancer Paternal Aunt 53   Breast cancer Other 75    Social History   Tobacco Use   Smoking status: Never   Smokeless tobacco: Never  Vaping Use   Vaping Use: Never used  Substance Use Topics   Alcohol use: No    Alcohol/week: 0.0 standard drinks   Drug use: No    ROS   Objective:   Vitals: BP (!) 156/91   Pulse 84   Temp 98 F (36.7 C)   Resp 20   SpO2 98%   Physical Exam Constitutional:      General: She is not in acute distress.    Appearance: She is well-developed. She is not ill-appearing, toxic-appearing or diaphoretic.  HENT:     Head: Normocephalic and atraumatic.     Right Ear: Tympanic membrane and ear canal normal. No drainage or tenderness. No middle ear effusion. There is no impacted cerumen. Tympanic membrane is not erythematous.     Left Ear: Tympanic membrane, ear canal and external ear normal. No drainage or tenderness.  No middle ear effusion. There is no impacted cerumen. Tympanic membrane is not erythematous.     Ears:     Comments: Erythematous external ear canal distally of the right side.  No drainage of pus or bleeding.    Nose: No congestion or rhinorrhea.     Mouth/Throat:     Mouth: Mucous membranes are moist. No oral lesions.     Pharynx: No pharyngeal swelling, oropharyngeal exudate, posterior oropharyngeal erythema or uvula  swelling.     Tonsils: No tonsillar exudate or tonsillar abscesses.  Eyes:     General: No scleral icterus.       Right eye: No discharge.        Left eye: No discharge.     Extraocular Movements: Extraocular movements intact.     Right eye: Normal extraocular motion.     Left eye: Normal extraocular motion.     Conjunctiva/sclera: Conjunctivae normal.     Pupils: Pupils are equal, round,  and reactive to light.  Cardiovascular:     Rate and Rhythm: Normal rate.  Pulmonary:     Effort: Pulmonary effort is normal.  Musculoskeletal:     Cervical back: Normal range of motion and neck supple.  Lymphadenopathy:     Cervical: No cervical adenopathy.  Skin:    General: Skin is warm and dry.     Findings: Rash (Dry scaly papular lesions over the dorsal aspect of the hands bilaterally worse over the right side) present.  Neurological:     General: No focal deficit present.     Mental Status: She is alert and oriented to person, place, and time.  Psychiatric:        Mood and Affect: Mood normal.        Behavior: Behavior normal.        Thought Content: Thought content normal.        Judgment: Judgment normal.    Assessment and Plan :   PDMP not reviewed this encounter.  1. Eustachian tube dysfunction, bilateral   2. Right ear pain   3. Essential hypertension   4. Rash of both hands    Counseled against the use of Q-tips the way she did to try and clear out earwax as she irritated the external ear canal distally.  Start Cortisporin HC for this.  Will use conservative management for what I suspect is eustachian tube dysfunction.  Recommended starting Flonase, Zyrtec, Sudafed.  Advised that she follow-up with ENT if her symptoms persist.  Use triamcinolone cream for the rash of the hands.  Counseled patient on potential for adverse effects with medications prescribed/recommended today, ER and return-to-clinic precautions discussed, patient verbalized understanding.    Jaynee Eagles,  PA-C 02/17/21 1112

## 2021-02-17 NOTE — ED Triage Notes (Signed)
Pt here with bilateral ear fullness since Thursday with some dizziness and headache. There is no cerumen impaction noted in triage. The pt also c/o rash on hands from prolonged use of gloves that she is allergic to.

## 2021-02-17 NOTE — Discharge Instructions (Addendum)
Eustachian tube dysfunction should respond to the daily use of Zyrtec and Flonase.  Right now that it is flared up you can use pseudoephedrine throughout this week.  Then only use it as needed as it can cause an increase in your blood pressure.  Make sure you follow-up with an ear nose throat specialist if your symptoms persist. For now, given the way that your skin looks really raw and is painful on the external ear canal of the right ear we will use Cortisporin HC drops just for 1 week.

## 2021-02-25 ENCOUNTER — Other Ambulatory Visit: Payer: Self-pay

## 2021-02-25 ENCOUNTER — Ambulatory Visit (INDEPENDENT_AMBULATORY_CARE_PROVIDER_SITE_OTHER): Payer: BC Managed Care – PPO | Admitting: Podiatry

## 2021-02-25 DIAGNOSIS — M7671 Peroneal tendinitis, right leg: Secondary | ICD-10-CM

## 2021-02-25 MED ORDER — BETAMETHASONE SOD PHOS & ACET 6 (3-3) MG/ML IJ SUSP
3.0000 mg | Freq: Once | INTRAMUSCULAR | Status: AC
  Administered 2021-02-25: 3 mg via INTRA_ARTICULAR

## 2021-02-25 NOTE — Progress Notes (Signed)
HPI: 60 y.o. female presenting today for new complaint regarding onset of pain and tenderness to the lateral aspect of the right foot has been going on approximately 2-4 months.  Sudden onset.  She denies a history of injury.  She has not done anything for treatment.  She presents for further treatment evaluation  Past Medical History:  Diagnosis Date   Anemia    H/O   Anxiety    Arthritis    BRCA negative 08/2018   MyRisk neg except NTHL1 VUS   Complication of anesthesia    Family history of breast cancer 08/2018   IBIS=8.65/riskscore=7.9%   Family history of ovarian cancer    MyRisk neg except NTHL1 VUS   GERD (gastroesophageal reflux disease)    NO MEDS   Hyperlipidemia    Hypertension    Hypothyroidism    Hashimoto's per patient   IFG (impaired fasting glucose) 9/51/8841   Metabolic syndrome 6/60/6301   Palpitations 2012   Evaluated by Ida Rogue, MD, Holter moniter   PONV (postoperative nausea and vomiting)    Pre-diabetes    Rectal mass    Thyroid disease    unspecified hypothyroidism    Past Surgical History:  Procedure Laterality Date   ANAL FISTULOTOMY N/A 11/30/2014   Procedure: ANAL FISTULOTOMY;  Surgeon: Dia Crawford III, MD;  Location: [facility];  Service: General;  Laterality: N/A;   BREAST BIOPSY Right 09/08/2018   affirm bx of mass, coil clip, ORGANIZING FAT NECROSIS AND   CESAREAN SECTION     COLONOSCOPY  2014   CYSTOSCOPY N/A 05/14/2020   Procedure: CYSTOSCOPY;  Surgeon: Gae Dry, MD;  Location: [facility];  Service: Gynecology;  Laterality: N/A;   DILATION AND CURETTAGE OF UTERUS     ENDOMETRIAL ABLATION W/ NOVASURE  05/29/2009   Dr Kenton Kingfisher   left foot surgery     RECTAL EXAM UNDER ANESTHESIA N/A 11/30/2014   Procedure: RECTAL EXAM UNDER ANESTHESIA;  Surgeon: Dia Crawford III, MD;  Location: [facility];  Service: General;  Laterality: N/A;   TOTAL LAPAROSCOPIC HYSTERECTOMY WITH BILATERAL SALPINGO OOPHORECTOMY Bilateral 05/14/2020   LAPAROSCOPIC  SUPRACERVICAL HYSTERECTOMY WITH BILATERAL SALPINGO OOPHORECTOMY;  Surgeon: Gae Dry, MD;  Location: [facility];  Service: Gynecology;  Laterality: Bilateral;     Physical Exam: General: The patient is alert and oriented x3 in no acute distress.  Dermatology: Skin is warm, dry and supple bilateral lower extremities. Negative for open lesions or macerations.  Vascular: Palpable pedal pulses bilaterally. No edema or erythema noted. Capillary refill within normal limits.  Neurological: Epicritic and protective threshold grossly intact bilaterally.   Musculoskeletal Exam: Range of motion within normal limits to all pedal and ankle joints bilateral. Muscle strength 5/5 in all groups bilateral.  There is no pain on palpation to the fifth metatarsal tubercle at the insertion of the peroneal tendon  Radiographic Exam:  Normal osseous mineralization. Joint spaces preserved. No fracture/dislocation/boney destruction.  Small bone spur noted to the fifth metatarsal tubercle of the symptomatic foot  Assessment: 1.  Insertional peroneal tendinitis right   Plan of Care:  1. Patient evaluated. X-Rays reviewed.  2.  Injection of 0.5 cc Celestone Soluspan injected into the fifth metatarsal tubercle area.  Care taken to avoid direct injection into the tendon 3.  Continue wearing good supportive [place] running shoes 4.  Return to clinic as needed      Edrick Kins, DPM [facility]  Dr. Edrick Kins, DPM  [address], [place] [address]                Office [phone]  Fax [phone]

## 2021-02-28 ENCOUNTER — Other Ambulatory Visit: Payer: Self-pay

## 2021-02-28 ENCOUNTER — Ambulatory Visit (INDEPENDENT_AMBULATORY_CARE_PROVIDER_SITE_OTHER): Payer: BC Managed Care – PPO | Admitting: Surgery

## 2021-02-28 ENCOUNTER — Encounter: Payer: Self-pay | Admitting: Surgery

## 2021-02-28 VITALS — BP 154/92 | HR 89 | Temp 98.3°F | Ht 67.0 in | Wt 215.0 lb

## 2021-02-28 DIAGNOSIS — N644 Mastodynia: Secondary | ICD-10-CM

## 2021-02-28 NOTE — Patient Instructions (Addendum)
We will get you scheduled for an ultrasound of the right axilla and your mammogram in April and have you follow up here after we get the results.  Your last mammogram was on 06/27/20.-we will schedule the ultrasound and mammogram for 06/30/21 at [facility]. Your follow up appointment will be on 07/04/21 with Dr Hampton Abbot.   Continue self breast exams. Call office for any new breast issues or concerns.    Breast Self-Awareness Breast self-awareness is knowing how your breasts look and feel. Doing breast self-awareness is important. It allows you to catch a breast problem early while it is still small and can be treated. All women should do breast self-awareness, including women who have had breast implants. Tell your doctor if you notice a change in your breasts. What you need: A mirror. A well-lit room. How to do a breast self-exam A breast self-exam is one way to learn what is normal for your breasts and to check for changes. To do a breast self-exam: Look for changes  Take off all the clothes above your waist. Stand in front of a mirror in a room with good lighting. Put your hands on your hips. Push your hands down. Look at your breasts and nipples in the mirror to see if one breast or nipple looks different from the other. Check to see if: The shape of one breast is different. The size of one breast is different. There are wrinkles, dips, and bumps in one breast and not the other. Look at each breast for changes in the skin, such as: Redness. Scaly areas. Look for changes in your nipples, such as: Liquid around the nipples. Bleeding. Dimpling. Redness. A change in where the nipples are. Feel for changes  Lie on your back on the floor. Feel each breast. To do this, follow these steps: Pick a breast to feel. Put the arm closest to that breast above your head. Use your other arm to feel the nipple area of your breast. Feel the area with the pads of your three middle fingers by making  small circles with your fingers. For the first circle, press lightly. For the second circle, press harder. For the third circle, press even harder. Keep making circles with your fingers at the different pressures as you move down your breast. Stop when you feel your ribs. Move your fingers a little toward the center of your body. Start making circles with your fingers again, this time going up until you reach your collarbone. Keep making up-and-down circles until you reach your armpit. Remember to keep using the three pressures. Feel the other breast in the same way. Sit or stand in the tub or shower. With soapy water on your skin, feel each breast the same way you did in step 2 when you were lying on the floor. Write down what you find Writing down what you find can help you remember what to tell your doctor. Write down: What is normal for each breast. Any changes you find in each breast, including: The kind of changes you find. Whether you have pain. Size and location of any lumps. When you last had your menstrual period. General tips Check your breasts every month. If you are breastfeeding, the best time to check your breasts is after you feed your baby or after you use a breast pump. If you get menstrual periods, the best time to check your breasts is 5-7 days after your menstrual period is over. With time, you will become comfortable with  the self-exam, and you will begin to know if there are changes in your breasts. Contact a doctor if you: See a change in the shape or size of your breasts or nipples. See a change in the skin of your breast or nipples, such as red or scaly skin. Have fluid coming from your nipples that is not normal. Find a lump or thick area that was not there before. Have pain in your breasts. Have any concerns about your breast health. Summary Breast self-awareness includes looking for changes in your breasts, as well as feeling for changes within your  breasts. Breast self-awareness should be done in front of a mirror in a well-lit room. You should check your breasts every month. If you get menstrual periods, the best time to check your breasts is 5-7 days after your menstrual period is over. Let your doctor know of any changes you see in your breasts, including changes in size, changes on the skin, pain or tenderness, or fluid from your nipples that is not normal. This information is not intended to replace advice given to you by your health care provider. Make sure you discuss any questions you have with your health care provider. Document Revised: 10/19/2017 Document Reviewed: 10/19/2017 Elsevier Patient Education  [facility] [facility].

## 2021-02-28 NOTE — Progress Notes (Signed)
02/28/2021  History of Present Illness: Desiree Walker is a 60 y.o. female presenting for follow up of right breast and axillary pain.  She had a mammogram and ultrasound on 12/20/20 which showed a possible right axillary mass.  Biopsy of the mass showed this to be benign fibroadipose tissue.  Recommendation was made for continuing self breast exams and screening mammogram.  She presents today to discuss possible surgery as she's concerned about the mass.  Reports some soreness at the patient's prior right breast biopsy site in June 2020 which on her mammogram two  months ago was stable.  She also reports stinging discomfort sometimes in the right axilla doing down the side of her chest.  Past Medical History: Past Medical History:  Diagnosis Date   Anemia    H/O   Anxiety    Arthritis    BRCA negative 08/2018   MyRisk neg except NTHL1 VUS   Complication of anesthesia    Family history of breast cancer 08/2018   IBIS=8.65/riskscore=7.9%   Family history of ovarian cancer    MyRisk neg except NTHL1 VUS   GERD (gastroesophageal reflux disease)    NO MEDS   Hyperlipidemia    Hypertension    Hypothyroidism    Hashimoto's per patient   IFG (impaired fasting glucose) 1/61/0960   Metabolic syndrome 4/54/0981   Palpitations 2012   Evaluated by Ida Rogue, MD, Holter moniter   PONV (postoperative nausea and vomiting)    Pre-diabetes    Rectal mass    Thyroid disease    unspecified hypothyroidism     Past Surgical History: Past Surgical History:  Procedure Laterality Date   ANAL FISTULOTOMY N/A 11/30/2014   Procedure: ANAL FISTULOTOMY;  Surgeon: Dia Crawford III, MD;  Location: [facility];  Service: General;  Laterality: N/A;   BREAST BIOPSY Right 09/08/2018   affirm bx of mass, coil clip, ORGANIZING FAT NECROSIS AND   CESAREAN SECTION     COLONOSCOPY  2014   CYSTOSCOPY N/A 05/14/2020   Procedure: CYSTOSCOPY;  Surgeon: Gae Dry, MD;  Location: [facility];  Service: Gynecology;   Laterality: N/A;   DILATION AND CURETTAGE OF UTERUS     ENDOMETRIAL ABLATION W/ NOVASURE  05/29/2009   Dr Kenton Kingfisher   left foot surgery     RECTAL EXAM UNDER ANESTHESIA N/A 11/30/2014   Procedure: RECTAL EXAM UNDER ANESTHESIA;  Surgeon: Dia Crawford III, MD;  Location: [facility];  Service: General;  Laterality: N/A;   TOTAL LAPAROSCOPIC HYSTERECTOMY WITH BILATERAL SALPINGO OOPHORECTOMY Bilateral 05/14/2020   LAPAROSCOPIC SUPRACERVICAL HYSTERECTOMY WITH BILATERAL SALPINGO OOPHORECTOMY;  Surgeon: Gae Dry, MD;  Location: [facility];  Service: Gynecology;  Laterality: Bilateral;    Home Medications: Prior to Admission medications   Medication Sig Start Date End Date Taking? Authorizing Provider  acetaminophen (TYLENOL) 500 MG tablet Take 500 mg by mouth every 6 (six) hours as needed.   Yes [provider]  amLODipine (NORVASC) 5 MG tablet Take 5 mg by mouth daily. 12/26/20  Yes [provider]  DUPIXENT 300 MG/2ML SOPN Inject 300 mg into the skin every 14 (fourteen) days. 04/01/20  Yes [provider]  ibuprofen (ADVIL) 200 MG tablet Take by mouth.   Yes [provider]  levothyroxine (SYNTHROID) 112 MCG tablet TAKE ONE TABLET DAILY. EVERY SUNDAY TAKE AN EXTRA ONE-HALF TABLET. Patient taking differently: Take 112-168 mcg by mouth See admin instructions. Take 1.5 tablets (168 mcg) by mouth on Sundays & take 1 tablet (112 mcg)  by mouth on all other days. 01/18/19  Yes Hubbard Hartshorn, FNP  losartan (COZAAR) 100 MG tablet Take 100 mg by mouth daily. 05/21/20  Yes [provider]  triamcinolone cream (KENALOG) 0.1 % Apply 1 application topically 2 (two) times daily. 02/17/21  Yes Jaynee Eagles, PA-C    Allergies: Allergies  Allergen Reactions   Scopolamine Other (See Comments)    "Petal"    Review of Systems: Review of Systems  Constitutional:  Negative for chills and fever.  Respiratory:  Negative for shortness of breath.    Cardiovascular:  Negative for chest pain.  Gastrointestinal:  Negative for nausea and vomiting.  Skin:  Negative for rash.   Physical Exam BP (!) 154/92    Pulse 89    Temp 98.3 F (36.8 C)    Ht 5' 7"  (1.702 m)    Wt 215 lb (97.5 kg)    SpO2 98%    BMI 33.67 kg/m  CONSTITUTIONAL: No acute distress HEENT:  Normocephalic, atraumatic, extraocular motion intact. RESPIRATORY:  Normal respiratory effort without pathologic use of accessory muscles. CARDIOVASCULAR: Regular rhythm and rate. BREAST:  Right breast without palpable masses, skin changes, or nipple changes.  Right axilla without palpable lymphadenopathy or mass. NEUROLOGIC:  Motor and sensation is grossly normal.  Cranial nerves are grossly intact. PSYCH:  Alert and oriented to person, place and time. Affect is normal.  Labs/Imaging: None new  Assessment and Plan: This is a 60 y.o. female with right breast/axillary discomfort/pain.  --Discussed again the benign findings of both biopsies done in 2020 and last month.  There was no evidence of atypia or malignancy on either specimen.  The right axillar mass is hard to palpate as on the ultrasound it appears to be below muscle on one side.  At this point, given the benign nature of both areas, I do not think there is any urgency or need for surgical excision.  Discussed with her the initial plan that we can talked about with ultrasound follow up in 6 months to further evaluate for any changes or growth.  If there are any changes or growth, then we can proceed to excise the axillary mass.  This would also coincide with her yearly mammogram, so would be able to assess things better that way. --Follow up in April 2023 after mammogram and ultrasound.  I spent 15 minutes dedicated to the care of this patient on the date of this encounter to include pre-visit review of records, face-to-face time with the patient discussing diagnosis and management, and any post-visit coordination of  care.   Melvyn Neth, [facility]

## 2021-03-15 ENCOUNTER — Other Ambulatory Visit: Payer: Self-pay

## 2021-03-15 ENCOUNTER — Ambulatory Visit
Admission: EM | Admit: 2021-03-15 | Discharge: 2021-03-15 | Disposition: A | Discharge status: Home / Self Care | Payer: BC Managed Care – PPO | Attending: Emergency Medicine | Admitting: Emergency Medicine

## 2021-03-15 DIAGNOSIS — J069 Acute upper respiratory infection, unspecified: Secondary | ICD-10-CM | POA: Insufficient documentation

## 2021-03-15 DIAGNOSIS — Z20822 Contact with and (suspected) exposure to covid-19: Secondary | ICD-10-CM | POA: Diagnosis not present

## 2021-03-15 LAB — RESP PANEL BY RT-PCR (FLU A&B, COVID) ARPGX2
Influenza A by PCR: NEGATIVE
Influenza B by PCR: NEGATIVE
SARS Coronavirus 2 by RT PCR: NEGATIVE

## 2021-03-15 MED ORDER — IBUPROFEN 600 MG PO TABS: 600.0000 mg | ORAL_TABLET | Freq: Four times a day (QID) | ORAL | 0 refills | Status: DC | PRN

## 2021-03-15 MED ORDER — FLUTICASONE PROPIONATE 50 MCG/ACT NA SUSP: 2.0000 | Freq: Every day | NASAL | 0 refills | Status: DC

## 2021-03-15 MED ORDER — AMOXICILLIN-POT CLAVULANATE 875-125 MG PO TABS
1.0000 | ORAL_TABLET | Freq: Two times a day (BID) | ORAL | 0 refills | Status: DC
Start: 2021-03-15 — End: 2021-05-30

## 2021-03-15 NOTE — ED Triage Notes (Signed)
Pt here with C/O dark green mucus, facial pressure, husband and daughter were sick with same, was treated with antibiotics.

## 2021-03-15 NOTE — ED Provider Notes (Signed)
HPI  SUBJECTIVE:  Desiree Walker is a 60 y.o. female who presents with cough productive of green sputum, headache, nasal congestion, rhinorrhea, sinus pain and pressure, postnasal drip, nausea and mild shortness of breath starting yesterday.  No fevers, body aches, upper dental pain,, swelling under her eyes, loss of sense of smell or taste, wheezing, chest pain, vomiting, diarrhea, abdominal pain.  Her husband and daughter recently had identical symptoms, were diagnosed with URI's, and treated with antibiotics.  No known COVID, flu exposure.  She got 4 doses of the COVID-vaccine.  She also got this years flu vaccine.  No antibiotics in the past month. No antipyretic in past 6 hours.  She has tried cough drops and pushing fluids with improvement in her symptoms.  Symptoms worse with eating.  She has a past medical history of COVID in December 2020, hypertension, hypercholesterolemia, hypothyroidism, prediabetes.  No history of pulmonary disease.  EFE:[id number], Victoriano Lain, NP  Past Medical History:  Diagnosis Date   Anemia    H/O   Anxiety    Arthritis    BRCA negative 08/2018   MyRisk neg except NTHL1 VUS   Complication of anesthesia    Family history of breast cancer 08/2018   IBIS=8.65/riskscore=7.9%   Family history of ovarian cancer    MyRisk neg except NTHL1 VUS   GERD (gastroesophageal reflux disease)    NO MEDS   Hyperlipidemia    Hypertension    Hypothyroidism    Hashimoto's per patient   IFG (impaired fasting glucose) 7/58/8325   Metabolic syndrome 4/98/2641   Palpitations 2012   Evaluated by Ida Rogue, MD, Holter moniter   PONV (postoperative nausea and vomiting)    Pre-diabetes    Rectal mass    Thyroid disease    unspecified hypothyroidism    Past Surgical History:  Procedure Laterality Date   ANAL FISTULOTOMY N/A 11/30/2014   Procedure: ANAL FISTULOTOMY;  Surgeon: Dia Crawford III, MD;  Location: [facility];  Service: General;  Laterality: N/A;   BREAST BIOPSY  Right 09/08/2018   affirm bx of mass, coil clip, ORGANIZING FAT NECROSIS AND   CESAREAN SECTION     COLONOSCOPY  2014   CYSTOSCOPY N/A 05/14/2020   Procedure: CYSTOSCOPY;  Surgeon: Gae Dry, MD;  Location: [facility];  Service: Gynecology;  Laterality: N/A;   DILATION AND CURETTAGE OF UTERUS     ENDOMETRIAL ABLATION W/ NOVASURE  05/29/2009   Dr Kenton Kingfisher   left foot surgery     RECTAL EXAM UNDER ANESTHESIA N/A 11/30/2014   Procedure: RECTAL EXAM UNDER ANESTHESIA;  Surgeon: Dia Crawford III, MD;  Location: [facility];  Service: General;  Laterality: N/A;   TOTAL LAPAROSCOPIC HYSTERECTOMY WITH BILATERAL SALPINGO OOPHORECTOMY Bilateral 05/14/2020   LAPAROSCOPIC SUPRACERVICAL HYSTERECTOMY WITH BILATERAL SALPINGO OOPHORECTOMY;  Surgeon: Gae Dry, MD;  Location: [facility];  Service: Gynecology;  Laterality: Bilateral;    Family History  Problem Relation Age of Onset   Hypertension Mother    Heart disease Mother 49       "blockages"   Alzheimer's disease Mother    Diverticulitis Father    Hernia Father    Depression Father    Alzheimer's disease Father    Hypothyroidism Sister    Uterine cancer Sister 75   Ovarian cancer Sister    Breast cancer Maternal Aunt 4   Hypothyroidism Daughter    Hypertension Brother    Stroke Maternal Grandfather    Heart attack Maternal Grandfather    Stroke  Paternal Grandmother    Hypothyroidism Sister    Ovarian cancer Sister    Hypothyroidism Daughter    Breast cancer Paternal Aunt 69   Breast cancer Other 42    Social History   Tobacco Use   Smoking status: Never   Smokeless tobacco: Never  Vaping Use   Vaping Use: Never used  Substance Use Topics   Alcohol use: No    Alcohol/week: 0.0 standard drinks   Drug use: No    No current facility-administered medications for this encounter.  Current Outpatient Medications:    acetaminophen (TYLENOL) 500 MG tablet, Take 500 mg by mouth every 6 (six) hours as needed., Disp: , Rfl:     amLODipine (NORVASC) 5 MG tablet, Take 5 mg by mouth daily., Disp: , Rfl:    amoxicillin-clavulanate (AUGMENTIN) 875-125 MG tablet, Take 1 tablet by mouth 2 (two) times daily. X 7 days, Disp: 14 tablet, Rfl: 0   DUPIXENT 300 MG/2ML SOPN, Inject 300 mg into the skin every 14 (fourteen) days., Disp: , Rfl:    fluticasone (FLONASE) 50 MCG/ACT nasal spray, Place 2 sprays into both nostrils daily., Disp: 16 g, Rfl: 0   ibuprofen (ADVIL) 600 MG tablet, Take 1 tablet (600 mg total) by mouth every 6 (six) hours as needed., Disp: 30 tablet, Rfl: 0   levothyroxine (SYNTHROID) 112 MCG tablet, TAKE ONE TABLET DAILY. EVERY SUNDAY TAKE AN EXTRA ONE-HALF TABLET. (Patient taking differently: Take 112-168 mcg by mouth See admin instructions. Take 1.5 tablets (168 mcg) by mouth on Sundays & take 1 tablet (112 mcg) by mouth on all other days.), Disp: 90 tablet, Rfl: 3   losartan (COZAAR) 100 MG tablet, Take 100 mg by mouth daily., Disp: , Rfl:    triamcinolone cream (KENALOG) 0.1 %, Apply 1 application topically 2 (two) times daily., Disp: 30 g, Rfl: 0  Allergies  Allergen Reactions   Scopolamine Other (See Comments)    "FELT LIKE I COULDN'T BREATHE"     ROS  As noted in HPI.   Physical Exam  BP (!) 150/84 (BP Location: Left Arm)    Pulse 100    Temp 97.9 F (36.6 C) (Oral)    Resp 18    Ht _0  (1.702 m)    Wt 97.5 kg    SpO2 100%    BMI 33.67 kg/m   Constitutional: Well developed, well nourished, no acute distress Eyes: PERRL, EOMI, conjunctiva normal bilaterally HENT: Normocephalic, atraumatic,mucus membranes moist.  Positive purulent nasal congestion.  Erythematous, swollen turbinates.  Positive frontal, maxillary sinus tenderness.  No obvious facial swelling.  Positive extensive postnasal drip.  Normal oropharynx. Neck: Positive cervical lymphadenopathy Respiratory: Clear to auscultation bilaterally, no rales, no wheezing, no rhonchi Cardiovascular: Normal rate and rhythm, no murmurs, no gallops,  no rubs GI: Nondistended skin: No rash, skin intact Musculoskeletal: Fully no deformities Neurologic: Alert & oriented x 3, CN III-XII grossly intact, no motor deficits, sensation grossly intact Psychiatric: Speech and behavior appropriate   ED Course   Medications - No data to display  Orders Placed This Encounter  Procedures   Resp Panel by RT-PCR (Flu A&B, Covid) Nasopharyngeal Swab    Standing Status:   Standing    Number of Occurrences:   1   Airborne and Contact precautions    Standing Status:   Standing    Number of Occurrences:   1   Results for orders placed or performed during the hospital encounter of 03/15/21 (from the past 24 hour(s))  Resp Panel by RT-PCR (Flu A&B, Covid) Nasopharyngeal Swab     Status: None   Collection Time: 03/15/21  2:39 PM   Specimen: Nasopharyngeal Swab; Nasopharyngeal(NP) swabs in vial transport medium  Result Value Ref Range   SARS Coronavirus 2 by RT PCR NEGATIVE NEGATIVE   Influenza A by PCR NEGATIVE NEGATIVE   Influenza B by PCR NEGATIVE NEGATIVE   No results found.  ED Clinical Impression  1. Upper respiratory tract infection, unspecified type   2. Encounter for laboratory testing for COVID-19 virus      ED Assessment/Plan   [phone].  Testing COVID and flu.  We will call in antivirals if either 1 of these are positive.  In the meantime, Flonase, Mucinex, saline nasal irrigation, Tylenol/ibuprofen, wait-and-see prescription of Augmentin.  Went over indications for starting this.  Work note for 2 days.  Flu, COVID are negative.  Plan as above.  Discussed labs, MDM, treatment plan, and plan for follow-up with patient  patient agrees with plan.   Meds ordered this encounter  Medications   fluticasone (FLONASE) 50 MCG/ACT nasal spray    Sig: Place 2 sprays into both nostrils daily.    Dispense:  16 g    Refill:  0   amoxicillin-clavulanate (AUGMENTIN) 875-125 MG tablet    Sig: Take 1 tablet by mouth 2 (two) times daily.  X 7 days    Dispense:  14 tablet    Refill:  0   ibuprofen (ADVIL) 600 MG tablet    Sig: Take 1 tablet (600 mg total) by mouth every 6 (six) hours as needed.    Dispense:  30 tablet    Refill:  0      *This clinic note was created using [employer]. Therefore, there may be occasional mistakes despite careful proofreading. ?    Melynda Ripple, MD 03/16/21 [phone]

## 2021-03-15 NOTE — Discharge Instructions (Addendum)
I will contact you if and only if your COVID or flu, are positive, and I will prescribe the appropriate antiviral in that case.  start Mucinex to keep the mucous thin and to decongest you.   You may take 600 mg of motrin with 1000 mg of tylenol up to 3-4 times a day as needed for pain. This is an effective combination for pain.  Most sinus infections are viral and do not need antibiotics unless you have a high fever, have had this for 10 days, or you get better and then get sick again.  I would wait to fill the Augmentin.  Use a NeilMed sinus rinse with distilled water as often as you want to to reduce nasal congestion. Follow the directions on the box.  Flonase will help with the nasal congestion.  Go to www.goodrx.com to look up your medications. This will give you a list of where you can find your prescriptions at the most affordable prices. Or you can ask the pharmacist what the cash price is. This is frequently cheaper than going through insurance.

## 2021-04-18 ENCOUNTER — Telehealth: Payer: Self-pay | Admitting: Surgery

## 2021-04-18 NOTE — Telephone Encounter (Signed)
Patient calling with increased right breast pain, she last saw Dr. Hampton Abbot on 02/28/21.  She said was scheduled for breast ultrasound in April and is wondering if we can get her rescheduled for breast ultrasound much sooner and wants follow up with Dr. Hampton Abbot.   Patient states best days for her to have ultrasound would be as follows: 2/17, 2/21 or 2/27.  I was going to schedule follow up but not sure if Dr. Hampton Abbot would prefer the ultrasound to be done first.  Please call patient, thank you.

## 2021-05-06 ENCOUNTER — Other Ambulatory Visit: Payer: Self-pay

## 2021-05-06 ENCOUNTER — Telehealth: Payer: Self-pay | Admitting: Surgery

## 2021-05-06 DIAGNOSIS — N644 Mastodynia: Secondary | ICD-10-CM

## 2021-05-06 DIAGNOSIS — Z9889 Other specified postprocedural states: Secondary | ICD-10-CM

## 2021-05-06 NOTE — Telephone Encounter (Signed)
Patient is calling and is wanting to get her u/s and mammogram done sooner than April. Please call patient and advise.

## 2021-05-06 NOTE — Telephone Encounter (Signed)
Spoke with patient and she is not comfortable waiting until April to have her mammogram and follow up ultrasound. She is still having a lot of pain at the biopsy site and is very concerned. I let the patient know if she gets her mammogram done early for this it would be a diagnostic mammogram and may fall to her insurances deductable. She is aware of this and wants to proceed.

## 2021-05-28 ENCOUNTER — Other Ambulatory Visit: Payer: Self-pay

## 2021-05-28 ENCOUNTER — Ambulatory Visit
Admission: RE | Admit: 2021-05-28 | Discharge: 2021-05-28 | Disposition: A | Discharge status: Home / Self Care | Payer: BC Managed Care – PPO | Source: Ambulatory Visit | Attending: Surgery | Admitting: Surgery

## 2021-05-28 DIAGNOSIS — Z9889 Other specified postprocedural states: Secondary | ICD-10-CM | POA: Diagnosis present

## 2021-05-28 DIAGNOSIS — N644 Mastodynia: Secondary | ICD-10-CM

## 2021-05-30 ENCOUNTER — Ambulatory Visit: Payer: BC Managed Care – PPO | Admitting: Surgery

## 2021-05-30 ENCOUNTER — Other Ambulatory Visit: Payer: Self-pay

## 2021-05-30 ENCOUNTER — Encounter: Payer: Self-pay | Admitting: Surgery

## 2021-05-30 VITALS — BP 158/84 | HR 92 | Temp 98.0°F | Wt 212.6 lb

## 2021-05-30 DIAGNOSIS — N644 Mastodynia: Secondary | ICD-10-CM

## 2021-05-30 NOTE — Progress Notes (Signed)
?05/30/2021 ? ?History of Present Illness: ?Desiree Walker is a 61 y.o. female presenting for follow up of right breast/axillary pain.  She was last seen on 02/28/21.  She had has a right breast mammogram and U/S of the right axilla on 12/20/20 which showed a right axillary mass.  This was biopsied on 01/08/21 which showed benign fibroadipose tissue.  She had a repeat mammogram and U/S on 05/28/21 which did not identify any masses and the biopsy site on the axilla had no changes.  I have personally viewed the imaging studies.  The patient reports that she has intermittent pain in the right axilla and right breast at the site of prior breast biopsy in 2020.  Denies any constant pain, but is intermittent.  She also had a right rotator cuff injury which confounds the symptoms.  Today, she denies any new or worsening pain symptoms.  She feels the ultrasound that she had on 05/28/21 did not cover the whole axilla and only the area of the prior biopsy. ? ?Past Medical History: ?Past Medical History:  ?Diagnosis Date  ? Anemia   ? H/O  ? Anxiety   ? Arthritis   ? BRCA negative 08/2018  ? MyRisk neg except NTHL1 VUS  ? Complication of anesthesia   ? Family history of breast cancer 08/2018  ? IBIS=8.65/riskscore=7.9%  ? Family history of ovarian cancer   ? MyRisk neg except NTHL1 VUS  ? GERD (gastroesophageal reflux disease)   ? NO MEDS  ? Hyperlipidemia   ? Hypertension   ? Hypothyroidism   ? Hashimoto's per patient  ? IFG (impaired fasting glucose) 08/05/2015  ? Metabolic syndrome 1/61/0960  ? Palpitations 2012  ? Evaluated by Ida Rogue, MD, Holter moniter  ? PONV (postoperative nausea and vomiting)   ? Pre-diabetes   ? Rectal mass   ? Thyroid disease   ? unspecified hypothyroidism  ?  ? ?Past Surgical History: ?Past Surgical History:  ?Procedure Laterality Date  ? ANAL FISTULOTOMY N/A 11/30/2014  ? Procedure: ANAL FISTULOTOMY;  Surgeon: Dia Crawford III, MD;  Location: [facility];  Service: General;  Laterality: N/A;  ?  BREAST BIOPSY Right 09/08/2018  ? affirm bx of mass, coil clip, ORGANIZING FAT NECROSIS AND ADJACENT CHANGES SUGGESTIVE OF RUPTURED CYST  ? BREAST BIOPSY Right 01/13/2021  ? PREDOMINANTLY BENIGN FIBROADIPOSE TISSUE SCANT FRAGMENTS OF UNREMARKABLE LYMPH NODE  ? CESAREAN SECTION    ? COLONOSCOPY  2014  ? CYSTOSCOPY N/A 05/14/2020  ? Procedure: CYSTOSCOPY;  Surgeon: Gae Dry, MD;  Location: [facility];  Service: Gynecology;  Laterality: N/A;  ? DILATION AND CURETTAGE OF UTERUS    ? ENDOMETRIAL ABLATION W/ NOVASURE  05/29/2009  ? Dr Kenton Kingfisher  ? left foot surgery    ? RECTAL EXAM UNDER ANESTHESIA N/A 11/30/2014  ? Procedure: RECTAL EXAM UNDER ANESTHESIA;  Surgeon: Dia Crawford III, MD;  Location: [facility];  Service: General;  Laterality: N/A;  ? TOTAL LAPAROSCOPIC HYSTERECTOMY WITH BILATERAL SALPINGO OOPHORECTOMY Bilateral 05/14/2020  ? LAPAROSCOPIC SUPRACERVICAL HYSTERECTOMY WITH BILATERAL SALPINGO OOPHORECTOMY;  Surgeon: Gae Dry, MD;  Location: [facility];  Service: Gynecology;  Laterality: Bilateral;  ? ? ?Home Medications: ?Prior to Admission medications   ?Medication Sig Start Date End Date Taking? Authorizing Provider  ?acetaminophen (TYLENOL) 500 MG tablet Take 500 mg by mouth every 6 (six) hours as needed.   Yes [provider]  ?amLODipine (NORVASC) 5 MG tablet Take 5 mg by mouth daily. 12/26/20  Yes [provider]  ?  DUPIXENT 300 MG/2ML SOPN Inject 300 mg into the skin every 14 (fourteen) days. 04/01/20  Yes [provider]  ?fluticasone (FLONASE) 50 MCG/ACT nasal spray Place 2 sprays into both nostrils daily. 03/15/21  Yes Melynda Ripple, MD  ?ibuprofen (ADVIL) 600 MG tablet Take 1 tablet (600 mg total) by mouth every 6 (six) hours as needed. 03/15/21  Yes Melynda Ripple, MD  ?levothyroxine (SYNTHROID) 112 MCG tablet TAKE ONE TABLET DAILY. EVERY SUNDAY TAKE AN EXTRA ONE-HALF TABLET. ?Patient taking differently: Take 112-168 mcg by mouth See admin instructions. Take 1.5  tablets (168 mcg) by mouth on Sundays & take 1 tablet (112 mcg) by mouth on all other days. 01/18/19  Yes Hubbard Hartshorn, FNP  ?losartan (COZAAR) 100 MG tablet Take 100 mg by mouth daily. 05/21/20  Yes [provider]  ?triamcinolone cream (KENALOG) 0.1 % Apply 1 application topically 2 (two) times daily. 02/17/21  Yes Jaynee Eagles, PA-C  ? ? ?Allergies: ?Allergies  ?Allergen Reactions  ? Scopolamine Other (See Comments)  ?  "[place]"  ? ? ?Review of Systems: ?Review of Systems  ?Constitutional:  Negative for chills and fever.  ?Respiratory:  Negative for shortness of breath.   ?Cardiovascular:  Negative for chest pain.  ?Gastrointestinal:  Negative for abdominal pain, nausea and vomiting.  ?Musculoskeletal:  Positive for joint pain and myalgias.  ? ?Physical Exam ?BP (!) 158/84   Pulse 92   Temp 98 ?F (36.7 ?C) (Oral)   Wt 212 lb 9.6 oz (96.4 kg)   SpO2 98%   BMI 33.30 kg/m?  ?CONSTITUTIONAL: No acute distress, well nourished. ?HEENT:  Normocephalic, atraumatic, extraocular motion intact. ?RESPIRATORY:  Normal respiratory effort without pathologic use of accessory muscles. ?CARDIOVASCULAR:  Regular rhythm and rate. ?BREAST:  Right breast without any palpable masses, skin changes, or nipple changes.  Prior biopsy site is well healed and there's stable associated soreness with it.  The right axilla does not have any palpable masses or lymphadenopathy.  Left breast without any palpable masses, skin changes, or nipple changes.  No left axillary lymphadenopathy ?NEUROLOGIC:  Motor and sensation is grossly normal.  Cranial nerves are grossly intact. ?PSYCH:  Alert and oriented to person, place and time. Affect is normal. ? ?Labs/Imaging: ?Mammogram and U/S 05/28/21: ?FINDINGS: ?No suspicious mass, malignant type microcalcifications or distortion ?detected in either breast. ?  ?Targeted ultrasound is performed, showing normal tissue in the right ?axilla. Previously seen lymph node that was  previously biopsied has ?a stable appearance. There is no cortical thickening. ?  ?IMPRESSION: ?No evidence of malignancy in either breast. ?  ?RECOMMENDATION: ?Bilateral screening mammogram in 1 year is recommended. ?  ?I have discussed the findings and recommendations with the patient. ?If applicable, a reminder letter will be sent to the patient ?regarding the next appointment. ?  ?BI-RADS CATEGORY  1: Negative ? ?Assessment and Plan: ?This is a 61 y.o. female with right breast pain. ? ?--Discussed with the patient the findings on her ultrasound and mammogram.  There's no evidence of any suspicious findings and there's no need for any biopsy at this point.  Recommended for now to continue with her yearly screening mammogram for further surveillance.  Will defer back to her PCP for future mammograms. ?--At this point, no indication for surgery. ?--Recommended that she may try wearing a sports bra to see if the extra support may help with the intermittent discomfort.  ?--Follow up as needed. ? ? ?I spent 25 minutes dedicated to  the care of this patient on the date of this encounter to include pre-visit review of records, face-to-face time with the patient discussing diagnosis and management, and any post-visit coordination of care. ? ? ?Melvyn Neth, MD ?[facility] ? ? ?  ?

## 2021-05-30 NOTE — Patient Instructions (Signed)
If you have any concerns or questions, please feel free to call our office.  ? ?Breast Self-Awareness ?Breast self-awareness is knowing how your breasts look and feel. Doing breast self-awareness is important. It allows you to catch a breast problem early while it is still small and can be treated. All women should do breast self-awareness, including women who have had breast implants. Tell your doctor if you notice a change in your breasts. ?What you need: ?A mirror. ?A well-lit room. ?How to do a breast self-exam ?A breast self-exam is one way to learn what is normal for your breasts and to check for changes. To do a breast self-exam: ?Look for changes ? ?Take off all the clothes above your waist. ?Stand in front of a mirror in a room with good lighting. ?Put your hands on your hips. ?Push your hands down. ?Look at your breasts and nipples in the mirror to see if one breast or nipple looks different from the other. Check to see if: ?The shape of one breast is different. ?The size of one breast is different. ?There are wrinkles, dips, and bumps in one breast and not the other. ?Look at each breast for changes in the skin, such as: ?Redness. ?Scaly areas. ?Look for changes in your nipples, such as: ?Liquid around the nipples. ?Bleeding. ?Dimpling. ?Redness. ?A change in where the nipples are. ?Feel for changes ? ?Lie on your back on the floor. ?Feel each breast. To do this, follow these steps: ?Pick a breast to feel. ?Put the arm closest to that breast above your head. ?Use your other arm to feel the nipple area of your breast. Feel the area with the pads of your three middle fingers by making small circles with your fingers. For the first circle, press lightly. For the second circle, press harder. For the third circle, press even harder. ?Keep making circles with your fingers at the different pressures as you move down your breast. Stop when you feel your ribs. ?Move your fingers a little toward the center of your  body. ?Start making circles with your fingers again, this time going up until you reach your collarbone. ?Keep making up-and-down circles until you reach your armpit. Remember to keep using the three pressures. ?Feel the other breast in the same way. ?Sit or stand in the tub or shower. ?With soapy water on your skin, feel each breast the same way you did in step 2 when you were lying on the floor. ?Write down what you find ?Writing down what you find can help you remember what to tell your doctor. Write down: ?What is normal for each breast. ?Any changes you find in each breast, including: ?The kind of changes you find. ?Whether you have pain. ?Size and location of any lumps. ?When you last had your menstrual period. ?General tips ?Check your breasts every month. ?If you are breastfeeding, the best time to check your breasts is after you feed your baby or after you use a breast pump. ?If you get menstrual periods, the best time to check your breasts is 5-7 days after your menstrual period is over. ?With time, you will become comfortable with the self-exam, and you will begin to know if there are changes in your breasts. ?Contact a doctor if you: ?See a change in the shape or size of your breasts or nipples. ?See a change in the skin of your breast or nipples, such as red or scaly skin. ?Have fluid coming from your nipples that is  not normal. ?Find a lump or thick area that was not there before. ?Have pain in your breasts. ?Have any concerns about your breast health. ?Summary ?Breast self-awareness includes looking for changes in your breasts, as well as feeling for changes within your breasts. ?Breast self-awareness should be done in front of a mirror in a well-lit room. ?You should check your breasts every month. If you get menstrual periods, the best time to check your breasts is 5-7 days after your menstrual period is over. ?Let your doctor know of any changes you see in your breasts, including changes in size,  changes on the skin, pain or tenderness, or fluid from your nipples that is not normal. ?This information is not intended to replace advice given to you by your health care provider. Make sure you discuss any questions you have with your health care provider. ?Document Revised: 10/19/2017 Document Reviewed: 10/19/2017 ?Elsevier Patient Education ? [place]. ? ?

## 2021-07-25 ENCOUNTER — Ambulatory Visit (INDEPENDENT_AMBULATORY_CARE_PROVIDER_SITE_OTHER): Payer: BC Managed Care – PPO

## 2021-07-25 ENCOUNTER — Ambulatory Visit: Payer: BC Managed Care – PPO | Admitting: Podiatry

## 2021-07-25 DIAGNOSIS — M79671 Pain in right foot: Secondary | ICD-10-CM

## 2021-07-25 DIAGNOSIS — M7671 Peroneal tendinitis, right leg: Secondary | ICD-10-CM | POA: Diagnosis not present

## 2021-07-25 NOTE — Progress Notes (Signed)
? ?HPI: 61 y.o. female presenting today for follow-up evaluation of right foot and ankle pain.  Patient states that since last visit she has developed increased swelling and edema to the right lower extremity.  She says that the injection she received last visit along the peroneal tendon sheath did not help alleviate any of her symptoms.  She has an appointment with vascular pending referral from her PCP to address the acute onset of swelling to the foot and ankle up into the leg.  She presents for further treatment and evaluation ? ?Past Medical History:  ?Diagnosis Date  ? Anemia   ? H/O  ? Anxiety   ? Arthritis   ? BRCA negative 08/2018  ? MyRisk neg except NTHL1 VUS  ? Complication of anesthesia   ? Family history of breast cancer 08/2018  ? IBIS=8.65/riskscore=7.9%  ? Family history of ovarian cancer   ? MyRisk neg except NTHL1 VUS  ? GERD (gastroesophageal reflux disease)   ? NO MEDS  ? Hyperlipidemia   ? Hypertension   ? Hypothyroidism   ? Hashimoto's per patient  ? IFG (impaired fasting glucose) 08/05/2015  ? Metabolic syndrome 4/78/2956  ? Palpitations 2012  ? Evaluated by Ida Rogue, MD, Holter moniter  ? PONV (postoperative nausea and vomiting)   ? Pre-diabetes   ? Rectal mass   ? Thyroid disease   ? unspecified hypothyroidism  ? ? ?Past Surgical History:  ?Procedure Laterality Date  ? ANAL FISTULOTOMY N/A 11/30/2014  ? Procedure: ANAL FISTULOTOMY;  Surgeon: Dia Crawford III, MD;  Location: [facility];  Service: General;  Laterality: N/A;  ? BREAST BIOPSY Right 09/08/2018  ? affirm bx of mass, coil clip, ORGANIZING FAT NECROSIS AND ADJACENT CHANGES SUGGESTIVE OF RUPTURED CYST  ? BREAST BIOPSY Right 01/13/2021  ? PREDOMINANTLY BENIGN FIBROADIPOSE TISSUE SCANT FRAGMENTS OF UNREMARKABLE LYMPH NODE  ? CESAREAN SECTION    ? COLONOSCOPY  2014  ? CYSTOSCOPY N/A 05/14/2020  ? Procedure: CYSTOSCOPY;  Surgeon: Gae Dry, MD;  Location: [facility];  Service: Gynecology;  Laterality: N/A;  ? DILATION AND  CURETTAGE OF UTERUS    ? ENDOMETRIAL ABLATION W/ NOVASURE  05/29/2009  ? Dr Kenton Kingfisher  ? left foot surgery    ? RECTAL EXAM UNDER ANESTHESIA N/A 11/30/2014  ? Procedure: RECTAL EXAM UNDER ANESTHESIA;  Surgeon: Dia Crawford III, MD;  Location: [facility];  Service: General;  Laterality: N/A;  ? TOTAL LAPAROSCOPIC HYSTERECTOMY WITH BILATERAL SALPINGO OOPHORECTOMY Bilateral 05/14/2020  ? LAPAROSCOPIC SUPRACERVICAL HYSTERECTOMY WITH BILATERAL SALPINGO OOPHORECTOMY;  Surgeon: Gae Dry, MD;  Location: [facility];  Service: Gynecology;  Laterality: Bilateral;  ? ? ?Allergies  ?Allergen Reactions  ? Scopolamine Other (See Comments)  ?  "[place]"  ? ?  ?Physical Exam: ?General: The patient is alert and oriented x3 in no acute distress. ? ?Dermatology: Skin is warm, dry and supple bilateral lower extremities. Negative for open lesions or macerations. ? ?Vascular: Palpable pedal pulses bilaterally.  Moderate edema noted right foot and ankle.  The calf is not tender to palpation or compression.  Clinically no concern for DVT ? ?Neurological: Light touch and protective threshold grossly intact ? ?Musculoskeletal Exam: No pedal deformities noted.  There is some tenderness to palpation throughout the right foot and ankle diffusely ? ?Radiographic Exam:  ?Normal osseous mineralization. Joint spaces preserved. No fracture/dislocation/boney destruction.  Otherwise normal exam ? ?Assessment: ?1.  Generalized diffuse pain with edema right lower extremity ? ? ?Plan of Care:  ?  1. Patient evaluated. X-Rays reviewed.  ?2.  Recommend compression daily.  Patient has a pair of compression socks ?3.  Patient declined oral NSAIDs.  She says that they have not helped significantly in the past ?4.  Appointment with vascular pending PCP referral ?5.  Return to clinic as needed ? ?  ?  ?Edrick Kins, DPM ?[place] ? ?Dr. Edrick Kins, DPM  ?  ?[address]                                         ?[place], [place] [address]                ?Office [phone]  ?Fax [phone] ? ? ? ? ?

## 2021-07-28 ENCOUNTER — Other Ambulatory Visit: Payer: Self-pay | Admitting: Podiatry

## 2021-07-28 ENCOUNTER — Telehealth: Payer: Self-pay | Admitting: Podiatry

## 2021-07-28 DIAGNOSIS — R600 Localized edema: Secondary | ICD-10-CM

## 2021-07-28 DIAGNOSIS — M79671 Pain in right foot: Secondary | ICD-10-CM

## 2021-07-28 NOTE — Progress Notes (Signed)
PRN right foot and ankle pain with swelling ?

## 2021-07-28 NOTE — Telephone Encounter (Signed)
Pt is very concerned about her R leg and is wanted a referral ASAP to the vein and vascular clinic regarding her last office visit with Dr. Amalia Hailey. She is very concerned that if something isn't done right away she will end up in the ER with a more complicated issue. She called her PCP but couldn't get into to see them until this Thursday. She wants something done before then and was told Dr Amalia Hailey can make the referral since he saw her last,. ? ?Please advise. ?

## 2021-07-28 NOTE — Telephone Encounter (Signed)
Referral placed for [facility].  Please notify patient.  Thanks, Dr. Amalia Hailey

## 2021-07-29 NOTE — Telephone Encounter (Signed)
Left message on voice message to inform pt that referral was submitted to [facility]. ?

## 2021-08-29 ENCOUNTER — Ambulatory Visit: Payer: BC Managed Care – PPO

## 2021-08-29 ENCOUNTER — Ambulatory Visit: Payer: BC Managed Care – PPO | Admitting: Podiatry

## 2021-08-29 DIAGNOSIS — M722 Plantar fascial fibromatosis: Secondary | ICD-10-CM | POA: Diagnosis not present

## 2021-08-29 DIAGNOSIS — R52 Pain, unspecified: Secondary | ICD-10-CM | POA: Diagnosis not present

## 2021-09-04 ENCOUNTER — Ambulatory Visit (INDEPENDENT_AMBULATORY_CARE_PROVIDER_SITE_OTHER): Payer: BC Managed Care – PPO | Admitting: Nurse Practitioner

## 2021-09-04 ENCOUNTER — Encounter (INDEPENDENT_AMBULATORY_CARE_PROVIDER_SITE_OTHER): Payer: Self-pay | Admitting: Nurse Practitioner

## 2021-09-04 VITALS — BP 144/83 | HR 90 | Resp 16 | Ht 67.0 in | Wt 216.0 lb

## 2021-09-04 DIAGNOSIS — M7989 Other specified soft tissue disorders: Secondary | ICD-10-CM

## 2021-09-04 DIAGNOSIS — E063 Autoimmune thyroiditis: Secondary | ICD-10-CM

## 2021-09-04 DIAGNOSIS — E038 Other specified hypothyroidism: Secondary | ICD-10-CM | POA: Diagnosis not present

## 2021-09-08 ENCOUNTER — Ambulatory Visit: Payer: BC Managed Care – PPO | Admitting: Surgery

## 2021-09-08 ENCOUNTER — Encounter: Payer: Self-pay | Admitting: Surgery

## 2021-09-08 VITALS — BP 161/98 | HR 89 | Temp 98.3°F | Ht 67.0 in | Wt 216.0 lb

## 2021-09-08 DIAGNOSIS — M79621 Pain in right upper arm: Secondary | ICD-10-CM | POA: Diagnosis not present

## 2021-09-08 MED ORDER — BETAMETHASONE SOD PHOS & ACET 6 (3-3) MG/ML IJ SUSP
3.0000 mg | Freq: Once | INTRAMUSCULAR | Status: AC
Start: 2021-09-08 — End: ?

## 2021-09-14 ENCOUNTER — Ambulatory Visit
Admission: EM | Admit: 2021-09-14 | Discharge: 2021-09-14 | Disposition: A | Discharge status: Home / Self Care | Payer: BC Managed Care – PPO | Attending: Emergency Medicine | Admitting: Emergency Medicine

## 2021-09-14 ENCOUNTER — Encounter: Payer: Self-pay | Admitting: Emergency Medicine

## 2021-09-14 DIAGNOSIS — M461 Sacroiliitis, not elsewhere classified: Secondary | ICD-10-CM

## 2021-09-14 DIAGNOSIS — M5441 Lumbago with sciatica, right side: Secondary | ICD-10-CM

## 2021-09-14 DIAGNOSIS — M5442 Lumbago with sciatica, left side: Secondary | ICD-10-CM | POA: Diagnosis not present

## 2021-09-14 MED ORDER — PREDNISONE 10 MG (21) PO TBPK: ORAL_TABLET | ORAL | 0 refills | Status: DC

## 2021-09-14 MED ORDER — BACLOFEN 10 MG PO TABS: 10.0000 mg | ORAL_TABLET | Freq: Three times a day (TID) | ORAL | 0 refills | Status: DC

## 2021-09-14 NOTE — Discharge Instructions (Signed)
Take the prednisone as directed.  Take the baclofen 10 mg every 8 hours, on a schedule for the next 48 hours and then as needed.  Apply moist heat to your back for 30 minutes at a time 2-3 times a day to improve blood flow to the area and help remove the lactic acid causing the spasm.  Follow the back exercises given at discharge.  Return for reevaluation for any new or worsening symptoms.   For reference on the [place] go to: [email or web address]

## 2021-09-14 NOTE — ED Triage Notes (Signed)
Patient c/o lower back pain that started Friday evening.  Patient states that she reached up and twisted her back.  Patient reports radiates down her upper legs. Patient has been taking Ibuprofen.

## 2021-09-14 NOTE — ED Provider Notes (Signed)
[facility]    CSN: [id number] Arrival date & time: 09/14/21  0801      History   Chief Complaint Chief Complaint  Patient presents with   Back Pain    HPI Desiree Walker is a 61 y.o. female.   HPI  61 year old female here for evaluation of back pain.  Patient reports that she was at work 2 days ago and she did a lot of lifting overhead and twisting.  That evening, she works in the IV room and a [employer], she felt a catch in her low back.  This increased yesterday and impaired her sleep last night.  She has been taking ibuprofen every 6 hours, 1 over-the-counter capsule, without any significant improvement of pain.  She states that the pain does radiate to her left hip and goes to the back of her thighs.  She denies any numbness or tingling in her lower legs or feet and she denies weakness in her lower extremities.  Past Medical History:  Diagnosis Date   Anemia    H/O   Anxiety    Arthritis    BRCA negative 08/2018   MyRisk neg except NTHL1 VUS   Complication of anesthesia    Family history of breast cancer 08/2018   IBIS=8.65/riskscore=7.9%   Family history of ovarian cancer    MyRisk neg except NTHL1 VUS   GERD (gastroesophageal reflux disease)    NO MEDS   Hyperlipidemia    Hypertension    Hypothyroidism    Hashimoto's per patient   IFG (impaired fasting glucose) 06/22/8117   Metabolic syndrome 1/47/8295   Palpitations 2012   Evaluated by Ida Rogue, MD, Holter moniter   PONV (postoperative nausea and vomiting)    Pre-diabetes    Rectal mass    Thyroid disease    unspecified hypothyroidism    Patient Active Problem List   Diagnosis Date Noted   Hypothyroidism due to Hashimoto's thyroiditis 06/12/2020   IGT (impaired glucose tolerance) 06/12/2020   Irritant contact dermatitis 06/12/2020   S/P laparoscopic supracervical hysterectomy 05/27/2020   Fibroid uterus 05/14/2020   Rotator cuff tendinitis, right 11/06/2019   Traumatic complete  tear of right rotator cuff 11/06/2019   COVID-19 virus infection 03/14/2019   S/P breast biopsy, right 01/12/2019   Breast pain, right 01/12/2019   Family history of breast cancer 06/02/2018   Family history of ovarian cancer 06/02/2018   Eczema 06/10/2017   History of uterine fibroid 62/13/0865   Metabolic syndrome 78/46/9629   Medication monitoring encounter 10/13/2016   Pressure in chest 12/17/2015   Anxiety disorder 12/17/2015   Obesity 12/17/2015   Sleep apnea 08/29/2015   Snoring 08/27/2015   Vitamin D deficiency 08/19/2015   Leg cramps 08/05/2015   IFG (impaired fasting glucose) 08/05/2015   Anal fistula    Other specified symptoms and signs involving the digestive system and abdomen 10/31/2014   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   Bladder neoplasm of uncertain malignant potential 06/09/2013   Female genuine stress incontinence 06/05/2013   Incomplete bladder emptying 06/05/2013   Urge incontinence 06/05/2013   Dysuria 06/05/2013   Other chronic cystitis without hematuria 06/05/2013   Retention of urine 06/05/2013   RLQ abdominal pain 07/27/2012   Palpitations 09/11/2010   Hypothyroidism, adult 05/10/2009   Hyperlipidemia LDL goal <130 05/10/2009   Hypertension goal BP (blood pressure) < 140/90 05/10/2009    Past Surgical History:  Procedure Laterality Date   ANAL FISTULOTOMY N/A 11/30/2014   Procedure: ANAL  FISTULOTOMY;  Surgeon: Dia Crawford III, MD;  Location: [facility];  Service: General;  Laterality: N/A;   BREAST BIOPSY Right 09/08/2018   affirm bx of mass, coil clip, ORGANIZING FAT NECROSIS AND ADJACENT CHANGES SUGGESTIVE OF RUPTURED CYST   BREAST BIOPSY Right 01/13/2021   PREDOMINANTLY BENIGN FIBROADIPOSE TISSUE SCANT FRAGMENTS OF UNREMARKABLE LYMPH NODE   CESAREAN SECTION     COLONOSCOPY  2014   CYSTOSCOPY N/A 05/14/2020   Procedure: CYSTOSCOPY;  Surgeon: Gae Dry, MD;  Location: [facility];  Service: Gynecology;  Laterality: N/A;    DILATION AND CURETTAGE OF UTERUS     ENDOMETRIAL ABLATION W/ NOVASURE  05/29/2009   Dr Kenton Kingfisher   left foot surgery     RECTAL EXAM UNDER ANESTHESIA N/A 11/30/2014   Procedure: RECTAL EXAM UNDER ANESTHESIA;  Surgeon: Dia Crawford III, MD;  Location: [facility];  Service: General;  Laterality: N/A;   TOTAL LAPAROSCOPIC HYSTERECTOMY WITH BILATERAL SALPINGO OOPHORECTOMY Bilateral 05/14/2020   LAPAROSCOPIC SUPRACERVICAL HYSTERECTOMY WITH BILATERAL SALPINGO OOPHORECTOMY;  Surgeon: Gae Dry, MD;  Location: [facility];  Service: Gynecology;  Laterality: Bilateral;    OB History     Gravida  4   Para  3   Term  3   Preterm      AB  1   Living  3      SAB  1   IAB      Ectopic      Multiple      Live Births  3        Obstetric Comments  1st Menstrual Cycle:  11 1st Pregnancy:  20          Home Medications    Prior to Admission medications   Medication Sig Start Date End Date Taking? Authorizing Provider  baclofen (LIORESAL) 10 MG tablet Take 1 tablet (10 mg total) by mouth 3 (three) times daily. 09/14/21  Yes Margarette Canada, NP  DUPIXENT 300 MG/2ML SOPN Inject 300 mg into the skin every 14 (fourteen) days. 04/01/20  Yes [provider]  levothyroxine (SYNTHROID) 112 MCG tablet TAKE ONE TABLET DAILY. EVERY SUNDAY TAKE AN EXTRA ONE-HALF TABLET. Patient taking differently: Take 112-168 mcg by mouth See admin instructions. Take 1.5 tablets (168 mcg) by mouth on Sundays & take 1 tablet (112 mcg) by mouth on all other days. 01/18/19  Yes Hubbard Hartshorn, FNP  losartan (COZAAR) 100 MG tablet Take 100 mg by mouth daily. 05/21/20  Yes [provider]  predniSONE (STERAPRED UNI-PAK 21 TAB) 10 MG (21) TBPK tablet Take 6 tablets on day 1, 5 tablets day 2, 4 tablets day 3, 3 tablets day 4, 2 tablets day 5, 1 tablet day 6 09/14/21  Yes Margarette Canada, NP  vitamin B-12 (CYANOCOBALAMIN) 1000 MCG tablet Take by mouth. 07/04/21  Yes [provider]    Family  History Family History  Problem Relation Age of Onset   Hypertension Mother    Heart disease Mother 56       "blockages"   Alzheimer's disease Mother    Diverticulitis Father    Hernia Father    Depression Father    Alzheimer's disease Father    Hypothyroidism Sister    Uterine cancer Sister 53   Ovarian cancer Sister    Breast cancer Maternal Aunt 90   Hypothyroidism Daughter    Hypertension Brother    Stroke Maternal Grandfather    Heart attack Maternal Grandfather    Stroke Paternal Grandmother  Hypothyroidism Sister    Ovarian cancer Sister    Hypothyroidism Daughter    Breast cancer Paternal Aunt 83   Breast cancer Other 69    Social History Social History   Tobacco Use   Smoking status: Never   Smokeless tobacco: Never  Vaping Use   Vaping Use: Never used  Substance Use Topics   Alcohol use: No    Alcohol/week: 0.0 standard drinks of alcohol   Drug use: No     Allergies   Oxycodone, Rosuvastatin, and Scopolamine   Review of Systems Review of Systems  Musculoskeletal:  Positive for back pain.  Neurological:  Negative for weakness and numbness.  Hematological: Negative.   Psychiatric/Behavioral: Negative.       Physical Exam Triage Vital Signs ED Triage Vitals  Enc Vitals Group     BP 09/14/21 0808 (!) 161/82     Pulse Rate 09/14/21 0808 92     Resp 09/14/21 0808 14     Temp 09/14/21 0808 98.2 F (36.8 C)     Temp Source 09/14/21 0808 Oral     SpO2 09/14/21 0808 99 %     Weight 09/14/21 0807 215 lb (97.5 kg)     Height 09/14/21 0807 5' 7"  (1.702 m)     Head Circumference --      Peak Flow --      Pain Score 09/14/21 0806 9     Pain Loc --      Pain Edu? --      Excl. in [place]? --    No data found.  Updated Vital Signs BP (!) 161/82 (BP Location: Left Arm)   Pulse 92   Temp 98.2 F (36.8 C) (Oral)   Resp 14   Ht 5' 7"  (1.702 m)   Wt 215 lb (97.5 kg)   SpO2 99%   BMI 33.67 kg/m   Visual Acuity Right Eye Distance:   Left Eye  Distance:   Bilateral Distance:    Right Eye Near:   Left Eye Near:    Bilateral Near:     Physical Exam Vitals and nursing note reviewed.  Constitutional:      Appearance: Normal appearance. She is not ill-appearing.  HENT:     Head: Normocephalic and atraumatic.  Musculoskeletal:        General: Tenderness present. No swelling, deformity or signs of injury.  Skin:    General: Skin is warm and dry.     Capillary Refill: Capillary refill takes less than 2 seconds.  Neurological:     General: No focal deficit present.     Mental Status: She is alert and oriented to person, place, and time.     Sensory: No sensory deficit.     Motor: No weakness.     Deep Tendon Reflexes: Reflexes normal.  Psychiatric:        Mood and Affect: Mood normal.        Behavior: Behavior normal.        Thought Content: Thought content normal.        Judgment: Judgment normal.      UC Treatments / Results  Labs (all labs ordered are listed, but only abnormal results are displayed) Labs Reviewed - No data to display  EKG   Radiology No results found.  Procedures Procedures (including critical care time)  Medications Ordered in UC Medications - No data to display  Initial Impression / Assessment and Plan / UC Course  I have reviewed the triage  vital signs and the nursing notes.  Pertinent labs & imaging results that were available during my care of the patient were reviewed by me and considered in my medical decision making (see chart for details).  Patient is a pleasant, nontoxic-appearing 61 year old female here for evaluation of low back pain that is been going on for the past 2 days and interfered with her sleep last night.  Patient states she initially was taking 2 over-the-counter ibuprofen but then decreased to 1 every 6 hours without any significant improvement of her pain.  She does have a history of impaired fasting glucose and she is prediabetic.  Also hyperlipidemia and  hypertension.  On exam patient's frame is in a normal axial carriage and she has no midline spinous tenderness.  There is some mild muscle tension in the bilateral lower lumbar paraspinous region but no overt spasm.  With the patient indicates she has pain is over her SI joint and there is inflammation of her bilateral sacroiliac ligaments.  More so on the right than the left.  Lower extremity strength is 5/5 bilaterally and her DTRs are 2+ bilaterally.  Patient's exam is consistent with lumbar strain and sacroiliac inflammation.  I will have her stop the ibuprofen and start her on a prednisone pack once daily for 6 days.  I will also prescribe baclofen to help with some of the muscle tension and help to prevent spasm.  Patient is encouraged to perform home physical therapy and apply moist heat to help improve blood flow to resolve the inflammation in her low back and SI joint.  Work note provided.   Final Clinical Impressions(s) / UC Diagnoses   Final diagnoses:  Acute midline low back pain with bilateral sciatica  SI (sacroiliac) joint inflammation ([place])     Discharge Instructions      Take the prednisone as directed.  Take the baclofen 10 mg every 8 hours, on a schedule for the next 48 hours and then as needed.  Apply moist heat to your back for 30 minutes at a time 2-3 times a day to improve blood flow to the area and help remove the lactic acid causing the spasm.  Follow the back exercises given at discharge.  Return for reevaluation for any new or worsening symptoms.   For reference on the [place] go to: [email or web address]     ED Prescriptions     Medication Sig Dispense Auth. Provider   predniSONE (STERAPRED UNI-PAK 21 TAB) 10 MG (21) TBPK tablet Take 6 tablets on day 1, 5 tablets day 2, 4 tablets day 3, 3 tablets day 4, 2 tablets day 5, 1 tablet day 6 21 tablet Margarette Canada, NP   baclofen (LIORESAL) 10 MG tablet Take 1 tablet (10 mg total) by  mouth 3 (three) times daily. 76 each Margarette Canada, NP      PDMP not reviewed this encounter.   Margarette Canada, NP 09/14/21 [phone]

## 2021-09-16 ENCOUNTER — Encounter (INDEPENDENT_AMBULATORY_CARE_PROVIDER_SITE_OTHER): Payer: Self-pay | Admitting: Nurse Practitioner

## 2021-09-19 ENCOUNTER — Ambulatory Visit: Payer: BC Managed Care – PPO | Admitting: Surgery

## 2021-09-22 ENCOUNTER — Inpatient Hospital Stay: Admission: RE | Admit: 2021-09-22 | Payer: BC Managed Care – PPO | Source: Ambulatory Visit

## 2021-09-22 NOTE — Progress Notes (Signed)
Subjective:    Patient ID: Desiree Walker, female    DOB: 1960/10/10, 61 y.o.   MRN: [id number] No chief complaint on file.   Desiree Walker is a 61 year old female that presents today for evaluation of lower extremity edema as a referral by Dr. Amalia Hailey.  She notes that her swelling is much better than it was previously.  She notes that approximately 3 weeks ago she stopped her amlodipine and this improved her swelling.  She does note that she has a history of varicosities in her family.  She also currently stands for extended periods elevation does help.  She denies any open wounds or ulcerations.    Review of Systems  Cardiovascular:  Positive for leg swelling.  All other systems reviewed and are negative.      Objective:   Physical Exam Vitals reviewed.  HENT:     Head: Normocephalic.  Cardiovascular:     Rate and Rhythm: Normal rate.     Pulses: Normal pulses.  Pulmonary:     Effort: Pulmonary effort is normal.  Skin:    General: Skin is warm and dry.  Neurological:     Mental Status: She is alert and oriented to person, place, and time.  Psychiatric:        Mood and Affect: Mood normal.        Behavior: Behavior normal.        Thought Content: Thought content normal.        Judgment: Judgment normal.     BP (!) 144/83 (BP Location: Left Arm)   Pulse 90   Resp 16   Ht _0  (1.702 m)   Wt 216 lb (98 kg)   BMI 33.83 kg/m   Past Medical History:  Diagnosis Date   Anemia    H/O   Anxiety    Arthritis    BRCA negative 08/2018   MyRisk neg except NTHL1 VUS   Complication of anesthesia    Family history of breast cancer 08/2018   IBIS=8.65/riskscore=7.9%   Family history of ovarian cancer    MyRisk neg except NTHL1 VUS   GERD (gastroesophageal reflux disease)    NO MEDS   Hyperlipidemia    Hypertension    Hypothyroidism    Hashimoto's per patient   IFG (impaired fasting glucose) 9/92/4268   Metabolic syndrome 3/41/9622   Palpitations 2012   Evaluated by  Ida Rogue, MD, Holter moniter   PONV (postoperative nausea and vomiting)    Pre-diabetes    Rectal mass    Thyroid disease    unspecified hypothyroidism    Social History   Socioeconomic History   Marital status: Married    Spouse name: Herbie Baltimore A. Hogg   Number of children: 3   Years of education: Not on file   Highest education level: Associate degree: occupational, Hotel manager, or vocational program  Occupational History   Occupation: Insurance underwriter Pediatrics  Tobacco Use   Smoking status: Never   Smokeless tobacco: Never  Vaping Use   Vaping Use: Never used  Substance and Sexual Activity   Alcohol use: No    Alcohol/week: 0.0 standard drinks of alcohol   Drug use: No   Sexual activity: Yes    Birth control/protection: Post-menopausal  Other Topics Concern   Not on file  Social History Narrative   Not on file   Social Determinants of Health   Financial Resource Strain: Low Risk  (09/21/2017)   Overall Financial Resource Strain (CARDIA)  Difficulty of Paying Living Expenses: Not hard at all  Food Insecurity: No Food Insecurity (09/21/2017)   Hunger Vital Sign    Worried About Running Out of Food in the Last Year: Never true    Ran Out of Food in the Last Year: Never true  Transportation Needs: No Transportation Needs (09/21/2017)   PRAPARE - Hydrologist (Medical): No    Lack of Transportation (Non-Medical): No  Physical Activity: Inactive (09/21/2017)   Exercise Vital Sign    Days of Exercise per Week: 0 days    Minutes of Exercise per Session: 0 min  Stress: No Stress Concern Present (09/21/2017)   [place]    Feeling of Stress : Not at all  Social Connections: Somewhat Isolated (09/21/2017)   Social Connection and Isolation Panel [NHANES]    Frequency of Communication with Friends and Family: More than three times a week    Frequency of Social Gatherings with  Friends and Family: More than three times a week    Attends Religious Services: Never    Marine scientist or Organizations: No    Attends Archivist Meetings: Never    Marital Status: Married  Human resources officer Violence: Not At Risk (09/21/2017)   Humiliation, Afraid, Rape, and Kick questionnaire    Fear of Current or Ex-Partner: No    Emotionally Abused: No    Physically Abused: No    Sexually Abused: No    Past Surgical History:  Procedure Laterality Date   ANAL FISTULOTOMY N/A 11/30/2014   Procedure: ANAL FISTULOTOMY;  Surgeon: Dia Crawford III, MD;  Location: [facility];  Service: General;  Laterality: N/A;   BREAST BIOPSY Right 09/08/2018   affirm bx of mass, coil clip, ORGANIZING FAT NECROSIS AND ADJACENT CHANGES SUGGESTIVE OF RUPTURED CYST   BREAST BIOPSY Right 01/13/2021   PREDOMINANTLY BENIGN FIBROADIPOSE TISSUE SCANT FRAGMENTS OF UNREMARKABLE LYMPH NODE   CESAREAN SECTION     COLONOSCOPY  2014   CYSTOSCOPY N/A 05/14/2020   Procedure: CYSTOSCOPY;  Surgeon: Gae Dry, MD;  Location: [facility];  Service: Gynecology;  Laterality: N/A;   DILATION AND CURETTAGE OF UTERUS     ENDOMETRIAL ABLATION W/ NOVASURE  05/29/2009   Dr Kenton Kingfisher   left foot surgery     RECTAL EXAM UNDER ANESTHESIA N/A 11/30/2014   Procedure: RECTAL EXAM UNDER ANESTHESIA;  Surgeon: Dia Crawford III, MD;  Location: [facility];  Service: General;  Laterality: N/A;   TOTAL LAPAROSCOPIC HYSTERECTOMY WITH BILATERAL SALPINGO OOPHORECTOMY Bilateral 05/14/2020   LAPAROSCOPIC SUPRACERVICAL HYSTERECTOMY WITH BILATERAL SALPINGO OOPHORECTOMY;  Surgeon: Gae Dry, MD;  Location: [facility];  Service: Gynecology;  Laterality: Bilateral;    Family History  Problem Relation Age of Onset   Hypertension Mother    Heart disease Mother 63       "blockages"   Alzheimer's disease Mother    Diverticulitis Father    Hernia Father    Depression Father    Alzheimer's disease Father    Hypothyroidism Sister     Uterine cancer Sister 12   Ovarian cancer Sister    Breast cancer Maternal Aunt 26   Hypothyroidism Daughter    Hypertension Brother    Stroke Maternal Grandfather    Heart attack Maternal Grandfather    Stroke Paternal Grandmother    Hypothyroidism Sister    Ovarian cancer Sister    Hypothyroidism Daughter    Breast cancer Paternal Aunt  50   Breast cancer Other 30    Allergies  Allergen Reactions   Oxycodone Nausea And Vomiting    No problems with Hydrocodone   Rosuvastatin Nausea Only   Scopolamine Other (See Comments)    "FELT LIKE I COULDN'T BREATHE"       Latest Ref Rng & Units 05/08/2020   11:53 AM 05/12/2019   11:45 AM 08/19/2018    3:45 PM  CBC  WBC 4.0 - 10.5 K/uL 4.9  4.2  5.2   Hemoglobin 12.0 - 15.0 g/dL 13.1  12.7  12.9   Hematocrit 36.0 - 46.0 % 39.0  38.0  39.7   Platelets 150 - 400 K/uL 226  204  217       CMP     Component Value Date/Time   NA 139 05/12/2019 1145   NA 140 12/20/2015 1223   NA 139 10/10/2013 1448   K 4.2 05/12/2019 1145   K 4.1 10/10/2013 1448   CL 107 05/12/2019 1145   CL 104 10/10/2013 1448   CO2 25 05/12/2019 1145   CO2 29 10/10/2013 1448   GLUCOSE 114 (H) 05/12/2019 1145   GLUCOSE 81 10/10/2013 1448   BUN 18 05/12/2019 1145   BUN 15 12/20/2015 1223   BUN 12 10/10/2013 1448   CREATININE 0.84 05/12/2019 1145   CALCIUM 9.1 05/12/2019 1145   CALCIUM 9.2 10/10/2013 1448   PROT 6.0 (L) 05/12/2019 1145   PROT 7.2 09/14/2013 0738   ALBUMIN 4.2 08/19/2018 1545   ALBUMIN 3.6 09/14/2013 0738   AST 11 05/12/2019 1145   AST 15 09/14/2013 0738   ALT 11 05/12/2019 1145   ALT 20 09/14/2013 0738   ALKPHOS 82 08/19/2018 1545   ALKPHOS 90 09/14/2013 0738   BILITOT 0.3 05/12/2019 1145   BILITOT 0.3 09/14/2013 0738   GFRNONAA 76 05/12/2019 1145   GFRAA 88 05/12/2019 1145     No results found.     Assessment & Plan:   1. Leg swelling Recommend:  I have had a long discussion with the patient regarding swelling and why it   causes symptoms.  Patient will begin wearing graduated compression on a daily basis a prescription was given. The patient will  wear the stockings first thing in the morning and removing them in the evening. The patient is instructed specifically not to sleep in the stockings.   In addition, behavioral modification will be initiated.  This will include frequent elevation, use of over the counter pain medications and exercise such as walking.  Consideration for a lymph pump will also be made based upon the effectiveness of conservative therapy.  This would help to improve the edema control and prevent sequela such as ulcers and infections   Patient should undergo duplex ultrasound of the venous system to ensure that DVT or reflux is not present.  The patient will follow-up with me after the ultrasound.    2. Hypothyroidism due to Hashimoto's thyroiditis This may play a factor in her swelling.   Current Outpatient Medications on File Prior to Visit  Medication Sig Dispense Refill   DUPIXENT 300 MG/2ML SOPN Inject 300 mg into the skin every 14 (fourteen) days.     levothyroxine (SYNTHROID) 112 MCG tablet TAKE ONE TABLET DAILY. EVERY SUNDAY TAKE AN EXTRA ONE-HALF TABLET. (Patient taking differently: Take 112-168 mcg by mouth See admin instructions. Take 1.5 tablets (168 mcg) by mouth on Sundays & take 1 tablet (112 mcg) by mouth on all other days.) 90 tablet  3   losartan (COZAAR) 100 MG tablet Take 100 mg by mouth daily.     vitamin B-12 (CYANOCOBALAMIN) 1000 MCG tablet Take by mouth.     Current Facility-Administered Medications on File Prior to Visit  Medication Dose Route Frequency Provider Last Rate Last Admin   betamethasone acetate-betamethasone sodium phosphate (CELESTONE) injection 3 mg  3 mg Intra-articular Once Edrick Kins, DPM        There are no Patient Instructions on file for this visit. No follow-ups on file.   Kris Hartmann, NP

## 2021-10-06 ENCOUNTER — Other Ambulatory Visit: Payer: BC Managed Care – PPO

## 2021-10-09 ENCOUNTER — Other Ambulatory Visit: Payer: Self-pay | Admitting: Surgery

## 2021-10-09 ENCOUNTER — Other Ambulatory Visit (INDEPENDENT_AMBULATORY_CARE_PROVIDER_SITE_OTHER): Payer: Self-pay | Admitting: Nurse Practitioner

## 2021-10-09 DIAGNOSIS — M7989 Other specified soft tissue disorders: Secondary | ICD-10-CM

## 2021-10-09 DIAGNOSIS — M79621 Pain in right upper arm: Secondary | ICD-10-CM

## 2021-10-10 ENCOUNTER — Encounter (INDEPENDENT_AMBULATORY_CARE_PROVIDER_SITE_OTHER): Payer: Self-pay | Admitting: Nurse Practitioner

## 2021-10-10 ENCOUNTER — Ambulatory Visit (INDEPENDENT_AMBULATORY_CARE_PROVIDER_SITE_OTHER): Payer: BC Managed Care – PPO

## 2021-10-10 ENCOUNTER — Ambulatory Visit (INDEPENDENT_AMBULATORY_CARE_PROVIDER_SITE_OTHER): Payer: BC Managed Care – PPO | Admitting: Nurse Practitioner

## 2021-10-10 VITALS — BP 152/88 | HR 91 | Resp 16 | Wt 219.8 lb

## 2021-10-10 DIAGNOSIS — I8311 Varicose veins of right lower extremity with inflammation: Secondary | ICD-10-CM | POA: Diagnosis not present

## 2021-10-10 DIAGNOSIS — M7989 Other specified soft tissue disorders: Secondary | ICD-10-CM

## 2021-10-10 NOTE — Progress Notes (Signed)
Subjective:    Patient ID: Desiree Walker, female    DOB: [birth year], 61 y.o.   MRN: [id number] Chief Complaint  Patient presents with   Follow-up    Ultrasound follow up    Desiree Walker is a 61 year old female that returns for evaluation of lower extremity edema and varicosities.  She notes that her swelling is much better than it was previously.  She notes that approximately 3 weeks ago she stopped her amlodipine and this improved her swelling somewhat.  The patient was also prescribed hydrochlorothiazide and has not yet started this.  She does note that she has a history of varicosities in her family.  She also currently stands for extended periods elevation does help.  She denies any open wounds or ulcerations.  Today noninvasive studies show reflux in her right great saphenous vein with no evidence of reflux in her left.  No evidence of DVT or superficial thrombophlebitis bilaterally.  No evidence of deep venous insufficiency previously.  Noninvasive studies today are somewhat consistent with previous studies done in 2022 which show evidence of reflux in the right great saphenous vein.    Review of Systems  Cardiovascular:  Positive for leg swelling.  All other systems reviewed and are negative.      Objective:   Physical Exam Vitals reviewed.  HENT:     Head: Normocephalic.  Cardiovascular:     Rate and Rhythm: Normal rate.     Pulses: Normal pulses.  Pulmonary:     Effort: Pulmonary effort is normal.  Skin:    General: Skin is warm and dry.  Neurological:     Mental Status: She is alert.  Psychiatric:        Mood and Affect: Mood normal.        Behavior: Behavior normal.        Thought Content: Thought content normal.        Judgment: Judgment normal.     BP (!) 152/88 (BP Location: Left Arm)   Pulse 91   Resp 16   Wt 219 lb 12.8 oz (99.7 kg)   BMI 34.43 kg/m   Past Medical History:  Diagnosis Date   Anemia    H/O   Anxiety    Arthritis    BRCA negative  08/2018   MyRisk neg except NTHL1 VUS   Complication of anesthesia    Family history of breast cancer 08/2018   IBIS=8.65/riskscore=7.9%   Family history of ovarian cancer    MyRisk neg except NTHL1 VUS   GERD (gastroesophageal reflux disease)    NO MEDS   Hyperlipidemia    Hypertension    Hypothyroidism    Hashimoto's per patient   IFG (impaired fasting glucose) 7/50/5183   Metabolic syndrome 3/58/2518   Palpitations 2012   Evaluated by Ida Rogue, MD, Holter moniter   PONV (postoperative nausea and vomiting)    Pre-diabetes    Rectal mass    Thyroid disease    unspecified hypothyroidism    Social History   Socioeconomic History   Marital status: Married    Spouse name: Herbie Baltimore A. Yorke   Number of children: 3   Years of education: Not on file   Highest education level: Associate degree: occupational, Hotel manager, or vocational program  Occupational History   Occupation: Insurance underwriter Pediatrics  Tobacco Use   Smoking status: Never   Smokeless tobacco: Never  Vaping Use   Vaping Use: Never used  Substance and Sexual Activity   Alcohol use:  No    Alcohol/week: 0.0 standard drinks of alcohol   Drug use: No   Sexual activity: Yes    Birth control/protection: Post-menopausal  Other Topics Concern   Not on file  Social History Narrative   Not on file   Social Determinants of Health   Financial Resource Strain: Low Risk  (09/21/2017)   Overall Financial Resource Strain (CARDIA)    Difficulty of Paying Living Expenses: Not hard at all  Food Insecurity: No Food Insecurity (09/21/2017)   Hunger Vital Sign    Worried About Running Out of Food in the Last Year: Never true    Ran Out of Food in the Last Year: Never true  Transportation Needs: No Transportation Needs (09/21/2017)   PRAPARE - Hydrologist (Medical): No    Lack of Transportation (Non-Medical): No  Physical Activity: Inactive (09/21/2017)   Exercise Vital Sign    Days of  Exercise per Week: 0 days    Minutes of Exercise per Session: 0 min  Stress: No Stress Concern Present (09/21/2017)   [place]    Feeling of Stress : Not at all  Social Connections: Somewhat Isolated (09/21/2017)   Social Connection and Isolation Panel [NHANES]    Frequency of Communication with Friends and Family: More than three times a week    Frequency of Social Gatherings with Friends and Family: More than three times a week    Attends Religious Services: Never    Marine scientist or Organizations: No    Attends Archivist Meetings: Never    Marital Status: Married  Human resources officer Violence: Not At Risk (09/21/2017)   Humiliation, Afraid, Rape, and Kick questionnaire    Fear of Current or Ex-Partner: No    Emotionally Abused: No    Physically Abused: No    Sexually Abused: No    Past Surgical History:  Procedure Laterality Date   ANAL FISTULOTOMY N/A 11/30/2014   Procedure: ANAL FISTULOTOMY;  Surgeon: Dia Crawford III, MD;  Location: [facility];  Service: General;  Laterality: N/A;   BREAST BIOPSY Right 09/08/2018   affirm bx of mass, coil clip, ORGANIZING FAT NECROSIS AND ADJACENT CHANGES SUGGESTIVE OF RUPTURED CYST   BREAST BIOPSY Right 01/13/2021   PREDOMINANTLY BENIGN FIBROADIPOSE TISSUE SCANT FRAGMENTS OF UNREMARKABLE LYMPH NODE   CESAREAN SECTION     COLONOSCOPY  2014   CYSTOSCOPY N/A 05/14/2020   Procedure: CYSTOSCOPY;  Surgeon: Gae Dry, MD;  Location: [facility];  Service: Gynecology;  Laterality: N/A;   DILATION AND CURETTAGE OF UTERUS     ENDOMETRIAL ABLATION W/ NOVASURE  05/29/2009   Dr Kenton Kingfisher   left foot surgery     RECTAL EXAM UNDER ANESTHESIA N/A 11/30/2014   Procedure: RECTAL EXAM UNDER ANESTHESIA;  Surgeon: Dia Crawford III, MD;  Location: [facility];  Service: General;  Laterality: N/A;   TOTAL LAPAROSCOPIC HYSTERECTOMY WITH BILATERAL SALPINGO OOPHORECTOMY Bilateral 05/14/2020    LAPAROSCOPIC SUPRACERVICAL HYSTERECTOMY WITH BILATERAL SALPINGO OOPHORECTOMY;  Surgeon: Gae Dry, MD;  Location: [facility];  Service: Gynecology;  Laterality: Bilateral;    Family History  Problem Relation Age of Onset   Hypertension Mother    Heart disease Mother 7       "blockages"   Alzheimer's disease Mother    Diverticulitis Father    Hernia Father    Depression Father    Alzheimer's disease Father    Hypothyroidism Sister  Uterine cancer Sister 29   Ovarian cancer Sister    Breast cancer Maternal Aunt 7   Hypothyroidism Daughter    Hypertension Brother    Stroke Maternal Grandfather    Heart attack Maternal Grandfather    Stroke Paternal Grandmother    Hypothyroidism Sister    Ovarian cancer Sister    Hypothyroidism Daughter    Breast cancer Paternal Aunt 51   Breast cancer Other 30    Allergies  Allergen Reactions   Amlodipine     Other reaction(s): Other (See Comments) Ankle swelling   Oxycodone Nausea And Vomiting    No problems with Hydrocodone   Rosuvastatin Nausea Only   Scopolamine Other (See Comments)    "FELT LIKE I COULDN'T BREATHE"       Latest Ref Rng & Units 05/08/2020   11:53 AM 05/12/2019   11:45 AM 08/19/2018    3:45 PM  CBC  WBC 4.0 - 10.5 K/uL 4.9  4.2  5.2   Hemoglobin 12.0 - 15.0 g/dL 13.1  12.7  12.9   Hematocrit 36.0 - 46.0 % 39.0  38.0  39.7   Platelets 150 - 400 K/uL 226  204  217       CMP     Component Value Date/Time   NA 139 05/12/2019 1145   NA 140 12/20/2015 1223   NA 139 10/10/2013 1448   K 4.2 05/12/2019 1145   K 4.1 10/10/2013 1448   CL 107 05/12/2019 1145   CL 104 10/10/2013 1448   CO2 25 05/12/2019 1145   CO2 29 10/10/2013 1448   GLUCOSE 114 (H) 05/12/2019 1145   GLUCOSE 81 10/10/2013 1448   BUN 18 05/12/2019 1145   BUN 15 12/20/2015 1223   BUN 12 10/10/2013 1448   CREATININE 0.84 05/12/2019 1145   CALCIUM 9.1 05/12/2019 1145   CALCIUM 9.2 10/10/2013 1448   PROT 6.0 (L) 05/12/2019 1145   PROT  7.2 09/14/2013 0738   ALBUMIN 4.2 08/19/2018 1545   ALBUMIN 3.6 09/14/2013 0738   AST 11 05/12/2019 1145   AST 15 09/14/2013 0738   ALT 11 05/12/2019 1145   ALT 20 09/14/2013 0738   ALKPHOS 82 08/19/2018 1545   ALKPHOS 90 09/14/2013 0738   BILITOT 0.3 05/12/2019 1145   BILITOT 0.3 09/14/2013 0738   GFRNONAA 76 05/12/2019 1145   GFRAA 88 05/12/2019 1145     No results found.     Assessment & Plan:   1. Leg swelling We discussed use of conservative therapy for edema including use of compression socks, elevation and activity.  The patient has not yet started hydrochlorothiazide and this may also improve her swelling somewhat.  She is advised to try to utilize compression socks daily and not to sleep in these.  She should elevate her lower extremities when not active.  The patient should also try to walk for approximately 15 to 20 minutes 3 to 4 days/week.  2. Varicose veins of right lower extremity with inflammation We discussed endovenous laser ablation given the patient's swelling and known venous reflux.  Following the discussion, the patient will continue with conservative therapy as noted above.  We will have patient return in 6 months to reevaluate symptoms.   Current Outpatient Medications on File Prior to Visit  Medication Sig Dispense Refill   DUPIXENT 300 MG/2ML SOPN Inject 300 mg into the skin every 14 (fourteen) days.     levothyroxine (SYNTHROID) 112 MCG tablet TAKE ONE TABLET DAILY. EVERY SUNDAY TAKE AN  EXTRA ONE-HALF TABLET. (Patient taking differently: Take 112-168 mcg by mouth See admin instructions. Take 1.5 tablets (168 mcg) by mouth on Sundays & take 1 tablet (112 mcg) by mouth on all other days.) 90 tablet 3   losartan (COZAAR) 100 MG tablet Take 100 mg by mouth daily.     vitamin B-12 (CYANOCOBALAMIN) 1000 MCG tablet Take by mouth.     baclofen (LIORESAL) 10 MG tablet Take 1 tablet (10 mg total) by mouth 3 (three) times daily. 30 each 0   predniSONE (STERAPRED  UNI-PAK 21 TAB) 10 MG (21) TBPK tablet Take 6 tablets on day 1, 5 tablets day 2, 4 tablets day 3, 3 tablets day 4, 2 tablets day 5, 1 tablet day 6 21 tablet 0   Current Facility-Administered Medications on File Prior to Visit  Medication Dose Route Frequency Provider Last Rate Last Admin   betamethasone acetate-betamethasone sodium phosphate (CELESTONE) injection 3 mg  3 mg Intra-articular Once Edrick Kins, DPM        There are no Patient Instructions on file for this visit. No follow-ups on file.   Kris Hartmann, NP

## 2021-10-13 ENCOUNTER — Ambulatory Visit
Admission: RE | Admit: 2021-10-13 | Discharge: 2021-10-13 | Disposition: A | Discharge status: Home / Self Care | Payer: BC Managed Care – PPO | Source: Ambulatory Visit | Attending: Surgery | Admitting: Surgery

## 2021-10-13 DIAGNOSIS — M79621 Pain in right upper arm: Secondary | ICD-10-CM | POA: Diagnosis present

## 2021-10-16 ENCOUNTER — Other Ambulatory Visit: Payer: Self-pay

## 2021-10-16 DIAGNOSIS — R2231 Localized swelling, mass and lump, right upper limb: Secondary | ICD-10-CM

## 2021-10-21 ENCOUNTER — Ambulatory Visit: Payer: BC Managed Care – PPO

## 2021-10-22 ENCOUNTER — Ambulatory Visit: Admission: RE | Admit: 2021-10-22 | Payer: BC Managed Care – PPO | Source: Ambulatory Visit

## 2021-11-24 ENCOUNTER — Telehealth: Payer: Self-pay

## 2021-11-24 DIAGNOSIS — R222 Localized swelling, mass and lump, trunk: Secondary | ICD-10-CM

## 2021-11-24 NOTE — Telephone Encounter (Signed)
CT Chest with contrast scheduled . [facility] 12/03/21 @ 9:20. Nothing to eat 4 hours prior.   Patient notified and will check her schedule to see if she can go this day. She will call back and confirm.

## 2021-12-03 ENCOUNTER — Ambulatory Visit: Payer: BC Managed Care – PPO

## 2021-12-05 ENCOUNTER — Ambulatory Visit: Payer: BC Managed Care – PPO | Admitting: Surgery

## 2021-12-12 ENCOUNTER — Ambulatory Visit: Admission: RE | Admit: 2021-12-12 | Payer: BC Managed Care – PPO | Source: Ambulatory Visit

## 2021-12-12 ENCOUNTER — Encounter: Payer: Self-pay | Admitting: Surgery

## 2021-12-12 ENCOUNTER — Ambulatory Visit
Admission: RE | Admit: 2021-12-12 | Discharge: 2021-12-12 | Disposition: A | Discharge status: Home / Self Care | Payer: BC Managed Care – PPO | Source: Ambulatory Visit | Attending: Surgery | Admitting: Surgery

## 2021-12-12 DIAGNOSIS — R222 Localized swelling, mass and lump, trunk: Secondary | ICD-10-CM | POA: Diagnosis not present

## 2021-12-12 LAB — POCT I-STAT CREATININE: Creatinine, Ser: 0.9 mg/dL (ref 0.44–1.00)

## 2021-12-12 MED ORDER — IOHEXOL 300 MG/ML  SOLN
75.0000 mL | Freq: Once | INTRAMUSCULAR | Status: AC | PRN
  Administered 2021-12-12: 75 mL via INTRAVENOUS

## 2021-12-15 ENCOUNTER — Telehealth (INDEPENDENT_AMBULATORY_CARE_PROVIDER_SITE_OTHER): Payer: Self-pay | Admitting: Nurse Practitioner

## 2021-12-15 NOTE — Telephone Encounter (Signed)
We do not size for compression socks in our office. She also does not need a prescription to get compression socks. We have discussed this at multiple office visits about the need for her to utilize compression.  She should also elevate her legs and be active as we have discussed.  As far as sizing she can go to a medical supply store and they will typically help her size.  She can see her PCP if she feels she needs to be seen urgently but otherwise we will be able to see her when we have an available time.

## 2021-12-15 NOTE — Telephone Encounter (Signed)
Patient called in stating that she was in need of an appt today or this week, I let her know I didn't have any available appt this week. Patient states her family has history of varicose veins. Patient is having some extreme pain in her legs and also having swelling. Also states it looks like white spoltches (spell check sorry)  on her legs. I let patient know I would speak with NP and see what her  recommendations are and a nurse would get back with her. Patient is also trying to buy compressions but having a hard time because she doesn't know her size    Please call and advise

## 2021-12-16 NOTE — Telephone Encounter (Signed)
Patient made aware with medical recommendations and verbalized understanding 

## 2021-12-17 ENCOUNTER — Encounter: Payer: Self-pay | Admitting: Surgery

## 2021-12-17 ENCOUNTER — Ambulatory Visit (INDEPENDENT_AMBULATORY_CARE_PROVIDER_SITE_OTHER): Payer: BC Managed Care – PPO | Admitting: Surgery

## 2021-12-17 VITALS — BP 150/80 | HR 92 | Temp 98.0°F | Ht 67.0 in | Wt 219.0 lb

## 2021-12-17 DIAGNOSIS — M79621 Pain in right upper arm: Secondary | ICD-10-CM

## 2021-12-17 NOTE — Patient Instructions (Addendum)
All your scans are negative and this seems to be related to your shoulder surgery. We suggest that you follow up with Orthopedics for this.  You are due for your screening Mammogram in March. This can be done through your Primary Care provider. You can just send them a MyChart message to get this ordered.      Continue self breast exams. Call office for any new breast issues or concerns.  Follow-up with our office as needed.  Please call and ask to speak with a nurse if you develop questions or concerns.

## 2021-12-17 NOTE — Progress Notes (Signed)
12/17/2021  History of Present Illness: Desiree Walker is a 61 y.o. female presenting for follow up of CT chest for right axillary pain.  She has had prior imaging with mammogram and U/S for right axillary pain, showing a possible mass and biopsy showed lymph node tissue and fatty tissue.  Subsequent imaging has not found any masses and a CT chest was recommended to further evaluate.  She had the CT chest done on 9/29, which also did not find any suspicious findings in the axilla, and no masses.    The patient still has pain in the right anterior axilla and superior right breast.  Right breast biopsy was also negative in the past.  She also has history of right shoulder injury and surgery.  The pain is intermittent and not constant.  Past Medical History: Past Medical History:  Diagnosis Date   Anemia    H/O   Anxiety    Arthritis    BRCA negative 08/2018   MyRisk neg except NTHL1 VUS   Complication of anesthesia    Family history of breast cancer 08/2018   IBIS=8.65/riskscore=7.9%   Family history of ovarian cancer    MyRisk neg except NTHL1 VUS   GERD (gastroesophageal reflux disease)    NO MEDS   Hyperlipidemia    Hypertension    Hypothyroidism    Hashimoto's per patient   IFG (impaired fasting glucose) 4/65/0354   Metabolic syndrome 6/56/8127   Palpitations 2012   Evaluated by Ida Rogue, MD, Holter moniter   PONV (postoperative nausea and vomiting)    Pre-diabetes    Rectal mass    Thyroid disease    unspecified hypothyroidism     Past Surgical History: Past Surgical History:  Procedure Laterality Date   ANAL FISTULOTOMY N/A 11/30/2014   Procedure: ANAL FISTULOTOMY;  Surgeon: Dia Crawford III, MD;  Location: [facility];  Service: General;  Laterality: N/A;   BREAST BIOPSY Right 09/08/2018   affirm bx of mass, coil clip, ORGANIZING FAT NECROSIS AND ADJACENT CHANGES SUGGESTIVE OF RUPTURED CYST   BREAST BIOPSY Right 01/13/2021   PREDOMINANTLY BENIGN FIBROADIPOSE TISSUE  SCANT FRAGMENTS OF UNREMARKABLE LYMPH NODE   CESAREAN SECTION     COLONOSCOPY  2014   CYSTOSCOPY N/A 05/14/2020   Procedure: CYSTOSCOPY;  Surgeon: Gae Dry, MD;  Location: [facility];  Service: Gynecology;  Laterality: N/A;   DILATION AND CURETTAGE OF UTERUS     ENDOMETRIAL ABLATION W/ NOVASURE  05/29/2009   Dr Kenton Kingfisher   left foot surgery     RECTAL EXAM UNDER ANESTHESIA N/A 11/30/2014   Procedure: RECTAL EXAM UNDER ANESTHESIA;  Surgeon: Dia Crawford III, MD;  Location: [facility];  Service: General;  Laterality: N/A;   TOTAL LAPAROSCOPIC HYSTERECTOMY WITH BILATERAL SALPINGO OOPHORECTOMY Bilateral 05/14/2020   LAPAROSCOPIC SUPRACERVICAL HYSTERECTOMY WITH BILATERAL SALPINGO OOPHORECTOMY;  Surgeon: Gae Dry, MD;  Location: [facility];  Service: Gynecology;  Laterality: Bilateral;    Home Medications: Prior to Admission medications   Medication Sig Start Date End Date Taking? Authorizing Provider  DUPIXENT 300 MG/2ML SOPN Inject 300 mg into the skin every 14 (fourteen) days. 04/01/20  Yes [provider]  levothyroxine (SYNTHROID) 112 MCG tablet TAKE ONE TABLET DAILY. EVERY SUNDAY TAKE AN EXTRA ONE-HALF TABLET. Patient taking differently: Take 112-168 mcg by mouth See admin instructions. Take 1.5 tablets (168 mcg) by mouth on Sundays & take 1 tablet (112 mcg) by mouth on all other days. 01/18/19  Yes Hubbard Hartshorn, FNP  losartan-hydrochlorothiazide (  HYZAAR) 100-12.5 MG tablet Take 1 tablet by mouth daily. 11/03/21  Yes [provider]  vitamin B-12 (CYANOCOBALAMIN) 1000 MCG tablet Take by mouth. 07/04/21  Yes [provider]    Allergies: Allergies  Allergen Reactions   Amlodipine     Other reaction(s): Other (See Comments) Ankle swelling   Oxycodone Nausea And Vomiting    No problems with Hydrocodone   Rosuvastatin Nausea Only   Scopolamine Other (See Comments)    "[place]"    Review of Systems: Review of Systems   Constitutional:  Negative for chills and fever.  Respiratory:  Positive for shortness of breath.   Cardiovascular:  Negative for chest pain.  Gastrointestinal:  Negative for abdominal pain, nausea and vomiting.  Musculoskeletal:  Positive for myalgias (right axillary pain).    Physical Exam BP (!) 150/80   Pulse 92   Temp 98 F (36.7 C)   Ht _0  (1.702 m)   Wt 219 lb (99.3 kg)   SpO2 97%   BMI 34.30 kg/m  CONSTITUTIONAL: No acute distress, well nourished. HEENT:  Normocephalic, atraumatic, extraocular motion intact. RESPIRATORY:  Normal respiratory effort without pathologic use of accessory muscles. CARDIOVASCULAR: Regular rhythm and rate BREAST:  Right breast without any palpable masses, skin changes, or nipple changes.  Right axilla without any palpable masses.  Stable pain location in anterior right axilla and anterior upper right breast. NEUROLOGIC:  Motor and sensation is grossly normal.  Cranial nerves are grossly intact. PSYCH:  Alert and oriented to person, place and time. Affect is normal.  Labs/Imaging: CT chest 12/12/21: IMPRESSION: 1. No chest wall mass or explanation for axillary pain. 2. No acute intrathoracic findings. 3. Small hiatal hernia.  Assessment and Plan: This is a 61 y.o. female with right axillary pain.  --Discussed with the patient the findings on her CT scan and there is no mass or other abnormalities at this point.  I think at this point, it would be prudent for the patient to go back to her orthopedic surgeon to discuss that the pain is possible from her shoulder surgery. --From breast standpoint, there are no masses and her prior right breast biopsy was negative.  She can continue with yearly mammograms and breast exams through her PCP. --Patient understands this plan and all of her questions have been answered.   I spent 20 minutes dedicated to the care of this patient on the date of this encounter to include pre-visit review of records,  face-to-face time with the patient discussing diagnosis and management, and any post-visit coordination of care.   Melvyn Neth, [facility]

## 2021-12-18 ENCOUNTER — Ambulatory Visit
Admission: RE | Admit: 2021-12-18 | Discharge: 2021-12-18 | Disposition: A | Discharge status: Home / Self Care | Payer: BC Managed Care – PPO | Source: Ambulatory Visit | Attending: Gerontology | Admitting: Gerontology

## 2021-12-18 ENCOUNTER — Other Ambulatory Visit: Payer: Self-pay | Admitting: Gerontology

## 2021-12-18 DIAGNOSIS — M79604 Pain in right leg: Secondary | ICD-10-CM | POA: Insufficient documentation

## 2021-12-18 DIAGNOSIS — I83811 Varicose veins of right lower extremities with pain: Secondary | ICD-10-CM

## 2021-12-18 DIAGNOSIS — L819 Disorder of pigmentation, unspecified: Secondary | ICD-10-CM | POA: Insufficient documentation

## 2021-12-18 DIAGNOSIS — R609 Edema, unspecified: Secondary | ICD-10-CM | POA: Diagnosis present

## 2021-12-22 ENCOUNTER — Other Ambulatory Visit: Payer: Self-pay | Admitting: Gerontology

## 2021-12-22 DIAGNOSIS — Z1231 Encounter for screening mammogram for malignant neoplasm of breast: Secondary | ICD-10-CM

## 2021-12-28 ENCOUNTER — Encounter: Payer: Self-pay | Admitting: Emergency Medicine

## 2021-12-28 ENCOUNTER — Ambulatory Visit
Admission: EM | Admit: 2021-12-28 | Discharge: 2021-12-28 | Disposition: A | Discharge status: Home / Self Care | Payer: BC Managed Care – PPO | Attending: Emergency Medicine | Admitting: Emergency Medicine

## 2021-12-28 DIAGNOSIS — M545 Low back pain, unspecified: Secondary | ICD-10-CM | POA: Diagnosis not present

## 2021-12-28 MED ORDER — NAPROXEN SODIUM 550 MG PO TABS: 550.0000 mg | ORAL_TABLET | Freq: Two times a day (BID) | ORAL | 0 refills | Status: DC

## 2021-12-28 NOTE — ED Triage Notes (Signed)
Pt c/o lower back pain. She states it is more specifically going down bilateral buttocks. Started about 2 days ago. She states she bent over at work when the pain started. She states she did not sleep well last night due to the pain and not being able to get comfortable.

## 2021-12-28 NOTE — Discharge Instructions (Signed)
Your pain is most likely caused by irritation to the muscles   Begin naproxen every morning and every evening for 5 days to reduce inflammation that occurs with injury and ideally, you are not seeing any improvement in your symptoms in 3 days stop medication use and begin methylprednisolone taper  Use your baclofen as needed for additional comfort, be mindful this medication will make you drowsy, this will help with the spasming  You may use heating pad in 15 minute intervals as needed for additional comfort, or  you may find comfort in using ice in 10-15 minutes over affected area  Begin stretching affected area daily for 10 minutes as tolerated to further loosen muscles   When lying down place pillow underneath and between knees for support  Can try sleeping without pillow on firm mattress   Practice good posture: head back, shoulders back, chest forward, pelvis back and weight distributed evenly on both legs  If pain persist after recommended treatment or reoccurs if may be beneficial to follow up with orthopedic specialist for evaluation, this doctor specializes in the bones and can manage your symptoms long-term with options such as but not limited to imaging, medications or physical therapy

## 2021-12-28 NOTE — ED Provider Notes (Signed)
[facility]    CSN: [id number] Arrival date & time: 12/28/21  1041      History   Chief Complaint Chief Complaint  Patient presents with   Back Pain    HPI Desiree Walker is a 61 y.o. female.   Patient presents with bilateral lower back pain radiating to the bilateral buttocks for 2 days.  Pain has been constant and described as a persisting soreness.  Symptoms began after bending and reaching to get a charger out of her lower cabinet.  Has occurred before.  Has attempted use of Tylenol 500 mg every 8 hours which has been minimally helpful.  Denies numbness, tingling, urinary or bowel incontinence.        Past Medical History:  Diagnosis Date   Anemia    H/O   Anxiety    Arthritis    BRCA negative 08/2018   MyRisk neg except NTHL1 VUS   Complication of anesthesia    Family history of breast cancer 08/2018   IBIS=8.65/riskscore=7.9%   Family history of ovarian cancer    MyRisk neg except NTHL1 VUS   GERD (gastroesophageal reflux disease)    NO MEDS   Hyperlipidemia    Hypertension    Hypothyroidism    Hashimoto's per patient   IFG (impaired fasting glucose) 3/33/5456   Metabolic syndrome 2/56/3893   Palpitations 2012   Evaluated by Ida Rogue, MD, Holter moniter   PONV (postoperative nausea and vomiting)    Pre-diabetes    Rectal mass    Thyroid disease    unspecified hypothyroidism    Patient Active Problem List   Diagnosis Date Noted   Hypothyroidism due to Hashimoto's thyroiditis 06/12/2020   IGT (impaired glucose tolerance) 06/12/2020   Irritant contact dermatitis 06/12/2020   S/P laparoscopic supracervical hysterectomy 05/27/2020   Fibroid uterus 05/14/2020   Rotator cuff tendinitis, right 11/06/2019   Traumatic complete tear of right rotator cuff 11/06/2019   COVID-19 virus infection 03/14/2019   S/P breast biopsy, right 01/12/2019   Breast pain, right 01/12/2019   Family history of breast cancer 06/02/2018   Family history of  ovarian cancer 06/02/2018   Eczema 06/10/2017   History of uterine fibroid 73/42/8768   Metabolic syndrome 11/57/2620   Medication monitoring encounter 10/13/2016   Pressure in chest 12/17/2015   Anxiety disorder 12/17/2015   Obesity 12/17/2015   Sleep apnea 08/29/2015   Snoring 08/27/2015   Vitamin D deficiency 08/19/2015   Leg cramps 08/05/2015   IFG (impaired fasting glucose) 08/05/2015   Anal fistula    Other specified symptoms and signs involving the digestive system and abdomen 10/31/2014   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   Bladder neoplasm of uncertain malignant potential 06/09/2013   Female genuine stress incontinence 06/05/2013   Incomplete bladder emptying 06/05/2013   Urge incontinence 06/05/2013   Dysuria 06/05/2013   Other chronic cystitis without hematuria 06/05/2013   Retention of urine 06/05/2013   RLQ abdominal pain 07/27/2012   Palpitations 09/11/2010   Hypothyroidism, adult 05/10/2009   Hyperlipidemia LDL goal <130 05/10/2009   Hypertension goal BP (blood pressure) < 140/90 05/10/2009    Past Surgical History:  Procedure Laterality Date   ANAL FISTULOTOMY N/A 11/30/2014   Procedure: ANAL FISTULOTOMY;  Surgeon: Dia Crawford III, MD;  Location: [facility];  Service: General;  Laterality: N/A;   BREAST BIOPSY Right 09/08/2018   affirm bx of mass, coil clip, ORGANIZING FAT NECROSIS AND ADJACENT CHANGES SUGGESTIVE OF RUPTURED CYST   BREAST BIOPSY  Right 01/13/2021   PREDOMINANTLY BENIGN FIBROADIPOSE TISSUE SCANT FRAGMENTS OF UNREMARKABLE LYMPH NODE   CESAREAN SECTION     COLONOSCOPY  2014   CYSTOSCOPY N/A 05/14/2020   Procedure: CYSTOSCOPY;  Surgeon: Gae Dry, MD;  Location: [facility];  Service: Gynecology;  Laterality: N/A;   DILATION AND CURETTAGE OF UTERUS     ENDOMETRIAL ABLATION W/ NOVASURE  05/29/2009   Dr Kenton Kingfisher   left foot surgery     RECTAL EXAM UNDER ANESTHESIA N/A 11/30/2014   Procedure: RECTAL EXAM UNDER ANESTHESIA;   Surgeon: Dia Crawford III, MD;  Location: [facility];  Service: General;  Laterality: N/A;   TOTAL LAPAROSCOPIC HYSTERECTOMY WITH BILATERAL SALPINGO OOPHORECTOMY Bilateral 05/14/2020   LAPAROSCOPIC SUPRACERVICAL HYSTERECTOMY WITH BILATERAL SALPINGO OOPHORECTOMY;  Surgeon: Gae Dry, MD;  Location: [facility];  Service: Gynecology;  Laterality: Bilateral;    OB History     Gravida  4   Para  3   Term  3   Preterm      AB  1   Living  3      SAB  1   IAB      Ectopic      Multiple      Live Births  3        Obstetric Comments  1st Menstrual Cycle:  11 1st Pregnancy:  20          Home Medications    Prior to Admission medications   Medication Sig Start Date End Date Taking? Authorizing Provider  DUPIXENT 300 MG/2ML SOPN Inject 300 mg into the skin every 14 (fourteen) days. 04/01/20  Yes [provider]  levothyroxine (SYNTHROID) 112 MCG tablet TAKE ONE TABLET DAILY. EVERY SUNDAY TAKE AN EXTRA ONE-HALF TABLET. Patient taking differently: Take 112-168 mcg by mouth See admin instructions. Take 1.5 tablets (168 mcg) by mouth on Sundays & take 1 tablet (112 mcg) by mouth on all other days. 01/18/19  Yes Hubbard Hartshorn, FNP  losartan-hydrochlorothiazide (HYZAAR) 100-12.5 MG tablet Take 1 tablet by mouth daily. 11/03/21  Yes [provider]  vitamin B-12 (CYANOCOBALAMIN) 1000 MCG tablet Take by mouth. 07/04/21  Yes [provider]    Family History Family History  Problem Relation Age of Onset   Hypertension Mother    Heart disease Mother 38       "blockages"   Alzheimer's disease Mother    Diverticulitis Father    Hernia Father    Depression Father    Alzheimer's disease Father    Hypothyroidism Sister    Uterine cancer Sister 60   Ovarian cancer Sister    Breast cancer Maternal Aunt 25   Hypothyroidism Daughter    Hypertension Brother    Stroke Maternal Grandfather    Heart attack Maternal Grandfather    Stroke Paternal  Grandmother    Hypothyroidism Sister    Ovarian cancer Sister    Hypothyroidism Daughter    Breast cancer Paternal Aunt 34   Breast cancer Other 60    Social History Social History   Tobacco Use   Smoking status: Never   Smokeless tobacco: Never  Vaping Use   Vaping Use: Never used  Substance Use Topics   Alcohol use: No    Alcohol/week: 0.0 standard drinks of alcohol   Drug use: No     Allergies   Amlodipine, Oxycodone, Rosuvastatin, and Scopolamine   Review of Systems Review of Systems  Constitutional: Negative.   Respiratory: Negative.    Cardiovascular:  Negative.   Musculoskeletal:  Positive for back pain. Negative for arthralgias, gait problem, joint swelling, myalgias, neck pain and neck stiffness.  Skin: Negative.   Neurological: Negative.      Physical Exam Triage Vital Signs ED Triage Vitals  Enc Vitals Group     BP 12/28/21 1147 (!) 170/99     Pulse Rate 12/28/21 1147 84     Resp 12/28/21 1147 18     Temp 12/28/21 1147 97.7 F (36.5 C)     Temp Source 12/28/21 1147 Oral     SpO2 12/28/21 1147 98 %     Weight 12/28/21 1146 218 lb 14.7 oz (99.3 kg)     Height 12/28/21 1146 5' 7"  (1.702 m)     Head Circumference --      Peak Flow --      Pain Score 12/28/21 1144 6     Pain Loc --      Pain Edu? --      Excl. in [place]? --    No data found.  Updated Vital Signs BP (!) 170/99 (BP Location: Left Arm)   Pulse 84   Temp 97.7 F (36.5 C) (Oral)   Resp 18   Ht 5' 7"  (1.702 m)   Wt 218 lb 14.7 oz (99.3 kg)   SpO2 98%   BMI 34.29 kg/m   Visual Acuity Right Eye Distance:   Left Eye Distance:   Bilateral Distance:    Right Eye Near:   Left Eye Near:    Bilateral Near:     Physical Exam Constitutional:      Appearance: Normal appearance.  HENT:     Head: Normocephalic.  Eyes:     Extraocular Movements: Extraocular movements intact.  Pulmonary:     Effort: Pulmonary effort is normal.  Musculoskeletal:     Comments: Lumbar spinal  tenderness present, tenderness over the bilateral latissimus dorsi and upper buttocks, able to twist turn and being but pain is elicited when bending, able to sit erect without a complication, positive straight leg test, strength 5 out of 5  Neurological:     Mental Status: She is alert and oriented to person, place, and time. Mental status is at baseline.  Psychiatric:        Mood and Affect: Mood normal.        Behavior: Behavior normal.      UC Treatments / Results  Labs (all labs ordered are listed, but only abnormal results are displayed) Labs Reviewed - No data to display  EKG   Radiology No results found.  Procedures Procedures (including critical care time)  Medications Ordered in UC Medications - No data to display  Initial Impression / Assessment and Plan / UC Course  I have reviewed the triage vital signs and the nursing notes.  Pertinent labs & imaging results that were available during my care of the patient were reviewed by me and considered in my medical decision making (see chart for details).  Acute bilateral low back pain without sciatica  Etiology is most likely muscular, discussed with patient, prescribed naproxen for outpatient use, patient already has methylprednisolone taper and baclofen which she has not used yet, recommended that if naproxen ineffective that she begin steroids, recommended RICE for additional support and follow-up with her orthopedic doctor if symptoms persist or worsen Final Clinical Impressions(s) / UC Diagnoses   Final diagnoses:  None   Discharge Instructions   None    ED Prescriptions   None  PDMP not reviewed this encounter.   Hans Eden, NP 12/28/21 1226

## 2021-12-31 ENCOUNTER — Ambulatory Visit: Payer: Self-pay

## 2022-01-12 ENCOUNTER — Encounter (INDEPENDENT_AMBULATORY_CARE_PROVIDER_SITE_OTHER): Payer: Self-pay

## 2022-03-04 ENCOUNTER — Ambulatory Visit
Admission: EM | Admit: 2022-03-04 | Discharge: 2022-03-04 | Disposition: A | Discharge status: Home / Self Care | Payer: BC Managed Care – PPO | Attending: Emergency Medicine | Admitting: Emergency Medicine

## 2022-03-04 DIAGNOSIS — R1031 Right lower quadrant pain: Secondary | ICD-10-CM | POA: Diagnosis present

## 2022-03-04 DIAGNOSIS — R350 Frequency of micturition: Secondary | ICD-10-CM | POA: Diagnosis present

## 2022-03-04 DIAGNOSIS — R300 Dysuria: Secondary | ICD-10-CM | POA: Insufficient documentation

## 2022-03-04 LAB — WET PREP, GENITAL
Clue Cells Wet Prep HPF POC: NONE SEEN
Sperm: NONE SEEN
Trich, Wet Prep: NONE SEEN
WBC, Wet Prep HPF POC: <10 — AB (ref <10)
Yeast Wet Prep HPF POC: NONE SEEN

## 2022-03-04 LAB — URINALYSIS, ROUTINE W REFLEX MICROSCOPIC
Bilirubin Urine: NEGATIVE
Glucose, UA: NEGATIVE mg/dL
Hgb urine dipstick: NEGATIVE
Ketones, ur: NEGATIVE mg/dL
Leukocytes,Ua: NEGATIVE
Nitrite: NEGATIVE
Protein, ur: NEGATIVE mg/dL
Specific Gravity, Urine: 1.02 (ref 1.005–1.030)
pH: 6.5 (ref 5.0–8.0)

## 2022-03-04 MED ORDER — PHENAZOPYRIDINE HCL 200 MG PO TABS: 200.0000 mg | ORAL_TABLET | Freq: Three times a day (TID) | ORAL | 0 refills | Status: DC | PRN

## 2022-03-04 MED ORDER — NITROFURANTOIN MONOHYD MACRO 100 MG PO CAPS: 100.0000 mg | ORAL_CAPSULE | Freq: Two times a day (BID) | ORAL | 0 refills | Status: AC

## 2022-03-04 MED ORDER — NAPROXEN 500 MG PO TABS: 500.0000 mg | ORAL_TABLET | Freq: Two times a day (BID) | ORAL | 0 refills | Status: DC

## 2022-03-04 NOTE — Discharge Instructions (Addendum)
Your wet prep was negative for BV or yeast and your urine was negative for urinary tract infection.  I am sending you home with some Pyridium to help with your symptoms.  Continue drinking plenty of extra fluids.  Please try and reestablish care with Dr. Bernardo Heater or move your appointment up with [facility].  Follow-up with your surgeon or with [facility] if your groin pain persists or gets worse.  You could have a small hernia.  Go to the ER if the pain becomes severe, you start having fevers above 100.4, vomiting, or for other concerns

## 2022-03-04 NOTE — ED Triage Notes (Signed)
Pt c/o uti sx's urgency, frequency,burning,pressure & fatigue. Denies any fevers, hematuria or back pain. No otc tx. Did home covid test 2 days ago which was neg.

## 2022-03-04 NOTE — ED Provider Notes (Signed)
HPI  SUBJECTIVE:  Desiree Walker is a 61 y.o. female who presents with 2 days of dysuria, urgency, urinary frequency, urinating small amounts at a time and malaise.  No cloudy or odorous urine, hematuria, vaginal odor, discharge, rash, itching, irritation.  No fevers, nausea, vomiting, abdominal pain.  She reports low back pain.  She is in a long-term monogamous relationship with her husband, who is asymptomatic.  STDs are not a concern today.  No recent antibiotics.  No antipyretic in the past 6 hours.  She tried increasing fluids and Tylenol.  Increasing fluids helps.  Symptoms are worse with not drinking water and with overexertion.  She also reports several weeks of right groin pain that is intermittent, lasting hours.  It has not changed since it started.  No inguinal or labial swelling.  She has a past medical history of UTI and states that this feels identical to that.  She is status post hysterectomy with bilateral salpingo-oophorectomy, hypertension, hypothyroidism, rectal fistula, repaired, chronic low back pain due to degenerative disc disease and SI dysfunction.  Neoplasm of the bladder of uncertain malignancy is listed in her problem list, but I am unable to find a record of this.  Patient denies history of this.  She had a normal cystoscopy done by urology in 2019.  [facility] [place].  Urology: [facility] although she is trying to establish care at [facility].  Past Medical History:  Diagnosis Date   Anemia    H/O   Anxiety    Arthritis    BRCA negative 08/2018   MyRisk neg except NTHL1 VUS   Complication of anesthesia    Family history of breast cancer 08/2018   IBIS=8.65/riskscore=7.9%   Family history of ovarian cancer    MyRisk neg except NTHL1 VUS   GERD (gastroesophageal reflux disease)    NO MEDS   Hyperlipidemia    Hypertension    Hypothyroidism    Hashimoto's per patient   IFG (impaired fasting glucose) 6/71/2458   Metabolic syndrome  0/99/8338   Palpitations 2012   Evaluated by Ida Rogue, MD, Holter moniter   PONV (postoperative nausea and vomiting)    Pre-diabetes    Rectal mass    Thyroid disease    unspecified hypothyroidism    Past Surgical History:  Procedure Laterality Date   ANAL FISTULOTOMY N/A 11/30/2014   Procedure: ANAL FISTULOTOMY;  Surgeon: Dia Crawford III, MD;  Location: [facility] ORS;  Service: General;  Laterality: N/A;   BREAST BIOPSY Right 09/08/2018   affirm bx of mass, coil clip, ORGANIZING FAT NECROSIS AND ADJACENT CHANGES SUGGESTIVE OF RUPTURED CYST   BREAST BIOPSY Right 01/13/2021   PREDOMINANTLY BENIGN FIBROADIPOSE TISSUE SCANT FRAGMENTS OF UNREMARKABLE LYMPH NODE   CESAREAN SECTION     COLONOSCOPY  2014   CYSTOSCOPY N/A 05/14/2020   Procedure: CYSTOSCOPY;  Surgeon: Gae Dry, MD;  Location: [facility] ORS;  Service: Gynecology;  Laterality: N/A;   DILATION AND CURETTAGE OF UTERUS     ENDOMETRIAL ABLATION W/ NOVASURE  05/29/2009   Dr Kenton Kingfisher   left foot surgery     RECTAL EXAM UNDER ANESTHESIA N/A 11/30/2014   Procedure: RECTAL EXAM UNDER ANESTHESIA;  Surgeon: Dia Crawford III, MD;  Location: [facility] ORS;  Service: General;  Laterality: N/A;   TOTAL LAPAROSCOPIC HYSTERECTOMY WITH BILATERAL SALPINGO OOPHORECTOMY Bilateral 05/14/2020   LAPAROSCOPIC SUPRACERVICAL HYSTERECTOMY WITH BILATERAL SALPINGO OOPHORECTOMY;  Surgeon: Gae Dry, MD;  Location: [facility] ORS;  Service: Gynecology;  Laterality: Bilateral;  Family History  Problem Relation Age of Onset   Hypertension Mother    Heart disease Mother 14       "blockages"   Alzheimer's disease Mother    Diverticulitis Father    Hernia Father    Depression Father    Alzheimer's disease Father    Hypothyroidism Sister    Uterine cancer Sister 72   Ovarian cancer Sister    Breast cancer Maternal Aunt 62   Hypothyroidism Daughter    Hypertension Brother    Stroke Maternal Grandfather    Heart attack Maternal Grandfather    Stroke  Paternal Grandmother    Hypothyroidism Sister    Ovarian cancer Sister    Hypothyroidism Daughter    Breast cancer Paternal Aunt 72   Breast cancer Other 61    Social History   Tobacco Use   Smoking status: Never   Smokeless tobacco: Never  Vaping Use   Vaping Use: Never used  Substance Use Topics   Alcohol use: No    Alcohol/week: 0.0 standard drinks of alcohol   Drug use: No     Current Facility-Administered Medications:    betamethasone acetate-betamethasone sodium phosphate (CELESTONE) injection 3 mg, 3 mg, Intra-articular, Once, Edrick Kins, DPM  Current Outpatient Medications:    DUPIXENT 300 MG/2ML SOPN, Inject 300 mg into the skin every 14 (fourteen) days., Disp: , Rfl:    levothyroxine (SYNTHROID) 112 MCG tablet, TAKE ONE TABLET DAILY. EVERY SUNDAY TAKE AN EXTRA ONE-HALF TABLET. (Patient taking differently: Take 112-168 mcg by mouth See admin instructions. Take 1.5 tablets (168 mcg) by mouth on Sundays & take 1 tablet (112 mcg) by mouth on all other days.), Disp: 90 tablet, Rfl: 3   losartan-hydrochlorothiazide (HYZAAR) 100-12.5 MG tablet, Take 1 tablet by mouth daily., Disp: , Rfl:    naproxen (NAPROSYN) 500 MG tablet, Take 1 tablet (500 mg total) by mouth 2 (two) times daily., Disp: 20 tablet, Rfl: 0   phenazopyridine (PYRIDIUM) 200 MG tablet, Take 1 tablet (200 mg total) by mouth 3 (three) times daily as needed for pain., Disp: 6 tablet, Rfl: 0   vitamin B-12 (CYANOCOBALAMIN) 1000 MCG tablet, Take by mouth., Disp: , Rfl:   Allergies  Allergen Reactions   Amlodipine     Other reaction(s): Other (See Comments) Ankle swelling   Oxycodone Nausea And Vomiting    No problems with Hydrocodone   Rosuvastatin Nausea Only   Scopolamine Other (See Comments)    "[place]"     ROS  As noted in HPI.   Physical Exam  BP (!) 147/85 (BP Location: Left Arm)   Pulse 87   Temp 97.7 F (36.5 C) (Oral)   Resp 16   Ht _0  (1.702 m)   Wt 97.5 kg    SpO2 98%   BMI 33.67 kg/m   Constitutional: Well developed, well nourished, no acute distress Eyes:  EOMI, conjunctiva normal bilaterally HENT: Normocephalic, atraumatic,mucus membranes moist Respiratory: Normal inspiratory effort Cardiovascular: Normal rate GI: nondistended soft, nontender.  No suprapubic, flank tenderness.  No right lower quadrant, left lower quadrant tenderness. GU: Positive right inguinal tenderness.  No appreciable bulging with Valsalva.  No labial swelling, tenderness.  No inguinal lymphadenopathy. Back: No CVAT.  Positive paralumbar, SI joint tenderness.  Mild L-spine tenderness. skin: No rash, skin intact Musculoskeletal: no deformities Neurologic: Alert & oriented x 3, no focal neuro deficits Psychiatric: Speech and behavior appropriate   ED Course   Medications - No data  to display  Orders Placed This Encounter  Procedures   Wet prep, genital    Standing Status:   Standing    Number of Occurrences:   1   Urinalysis, Routine w reflex microscopic Urine, Clean Catch    Standing Status:   Standing    Number of Occurrences:   1    Results for orders placed or performed during the hospital encounter of 03/04/22 (from the past 24 hour(s))  Urinalysis, Routine w reflex microscopic Urine, Clean Catch     Status: None   Collection Time: 03/04/22  2:42 PM  Result Value Ref Range   Color, Urine YELLOW YELLOW   APPearance CLEAR CLEAR   Specific Gravity, Urine 1.020 1.005 - 1.030   pH 6.5 5.0 - 8.0   Glucose, UA NEGATIVE NEGATIVE mg/dL   Hgb urine dipstick NEGATIVE NEGATIVE   Bilirubin Urine NEGATIVE NEGATIVE   Ketones, ur NEGATIVE NEGATIVE mg/dL   Protein, ur NEGATIVE NEGATIVE mg/dL   Nitrite NEGATIVE NEGATIVE   Leukocytes,Ua NEGATIVE NEGATIVE  Wet prep, genital     Status: Abnormal   Collection Time: 03/04/22  4:33 PM   Specimen: Vaginal  Result Value Ref Range   Yeast Wet Prep HPF POC NONE SEEN NONE SEEN   Trich, Wet Prep NONE SEEN NONE SEEN    Clue Cells Wet Prep HPF POC NONE SEEN NONE SEEN   WBC, Wet Prep HPF POC <10 (A) <10   Sperm NONE SEEN    No results found.  ED Clinical Impression  1. Dysuria   2. Urinary frequency   3. Right inguinal pain      ED Assessment/Plan     Outside records reviewed.  As noted below.   1.  Urinary symptoms.  UA negative for urinary tract infection.  Wet prep with some WBCs, no BV or yeast.  Patient has no CVA, flank tenderness concerning for nephrolithiasis or pyelonephritis.  I suspect her back pain is musculoskeletal with the paralumbar tenderness and SI joint tenderness.  Will try some Naprosyn/Tylenol for the back pain.  Will send patient home with some Pyridium and Macrobid as she states this feels identical to previous UTIs.  Could bn an interstitial cystitis and discussed this with patient.  Follow-up with Dr. Bernardo Heater or [facility] urology ASAP if symptoms persist.  Patient last saw Dr. Bernardo Heater of [facility] in 2019.  She had a cystoscopy and had a normal bladder.  2.  Groin pain.  No appreciable inguinal hernia, although this is in the differential.  Abdomen benign.  This has been present for several weeks, and has not changed since it started.  Patient will keep an eye on this.  She will follow-up with a surgeon if it gets worse.  Discussed labs, MDM, treatment plan, and plan for follow-up with patient. Discussed sn/sx that should prompt return to the ED. patient agrees with plan.   Meds ordered this encounter  Medications   phenazopyridine (PYRIDIUM) 200 MG tablet    Sig: Take 1 tablet (200 mg total) by mouth 3 (three) times daily as needed for pain.    Dispense:  6 tablet    Refill:  0   naproxen (NAPROSYN) 500 MG tablet    Sig: Take 1 tablet (500 mg total) by mouth 2 (two) times daily.    Dispense:  20 tablet    Refill:  0      *This clinic note was created using Lobbyist. Therefore, there may be occasional  mistakes despite  careful proofreading.  ?    Melynda Ripple, MD 03/05/22 [phone]

## 2022-03-27 ENCOUNTER — Encounter (INDEPENDENT_AMBULATORY_CARE_PROVIDER_SITE_OTHER): Payer: Self-pay | Admitting: Vascular Surgery

## 2022-03-27 ENCOUNTER — Ambulatory Visit (INDEPENDENT_AMBULATORY_CARE_PROVIDER_SITE_OTHER): Payer: BC Managed Care – PPO | Admitting: Vascular Surgery

## 2022-03-27 VITALS — BP 132/83 | HR 83 | Resp 16 | Wt 219.0 lb

## 2022-03-27 DIAGNOSIS — I83811 Varicose veins of right lower extremities with pain: Secondary | ICD-10-CM | POA: Diagnosis not present

## 2022-03-27 DIAGNOSIS — I10 Essential (primary) hypertension: Secondary | ICD-10-CM | POA: Diagnosis not present

## 2022-03-27 NOTE — Assessment & Plan Note (Signed)
Patient has right great saphenous vein reflux and symptoms refractory to appropriate conservative therapies as above.  Given that finding, I would recommend laser ablation of the right great saphenous vein due to her symptomatic reflux.  Discussed the risks and benefits of the procedure.  I discussed the reason and rationale for treatment.  She voices her understanding and desires to proceed with right great saphenous vein laser ablation.

## 2022-03-27 NOTE — Progress Notes (Signed)
MRN : [id number]  Desiree Walker is a 62 y.o. (1960/05/14) female who presents with chief complaint of  Chief Complaint  Patient presents with   Follow-up    6 month follow up  .  History of Present Illness: Patient returns today in follow up of her right leg pain and swelling.  She has been diligently wearing her compression socks, elevating her legs, and remaining active.  With this, her symptoms persist and are about the same as they were at her last visit.  She denies any ulceration or infection.  No fevers or chills.  Pain and swelling are affecting her on a daily basis although it is much worse in the end of days and that it is early on in the day.  A previously performed venous duplex shows right great saphenous vein reflux.  Current Outpatient Medications  Medication Sig Dispense Refill   DUPIXENT 300 MG/2ML SOPN Inject 300 mg into the skin every 14 (fourteen) days.     levothyroxine (SYNTHROID) 112 MCG tablet TAKE ONE TABLET DAILY. EVERY SUNDAY TAKE AN EXTRA ONE-HALF TABLET. (Patient taking differently: Take 112-168 mcg by mouth See admin instructions. Take 1.5 tablets (168 mcg) by mouth on Sundays & take 1 tablet (112 mcg) by mouth on all other days.) 90 tablet 3   losartan-hydrochlorothiazide (HYZAAR) 100-12.5 MG tablet Take 1 tablet by mouth daily.     naproxen (NAPROSYN) 500 MG tablet Take 1 tablet (500 mg total) by mouth 2 (two) times daily. 20 tablet 0   phenazopyridine (PYRIDIUM) 200 MG tablet Take 1 tablet (200 mg total) by mouth 3 (three) times daily as needed for pain. 6 tablet 0   vitamin B-12 (CYANOCOBALAMIN) 1000 MCG tablet Take by mouth.     Current Facility-Administered Medications  Medication Dose Route Frequency Provider Last Rate Last Admin   betamethasone acetate-betamethasone sodium phosphate (CELESTONE) injection 3 mg  3 mg Intra-articular Once Edrick Kins, DPM        Past Medical History:  Diagnosis Date   Anemia    H/O   Anxiety    Arthritis     BRCA negative 08/2018   MyRisk neg except NTHL1 VUS   Complication of anesthesia    Family history of breast cancer 08/2018   IBIS=8.65/riskscore=7.9%   Family history of ovarian cancer    MyRisk neg except NTHL1 VUS   GERD (gastroesophageal reflux disease)    NO MEDS   Hyperlipidemia    Hypertension    Hypothyroidism    Hashimoto's per patient   IFG (impaired fasting glucose) 09/02/5091   Metabolic syndrome 2/67/1245   Palpitations 2012   Evaluated by Ida Rogue, MD, Holter moniter   PONV (postoperative nausea and vomiting)    Pre-diabetes    Rectal mass    Thyroid disease    unspecified hypothyroidism    Past Surgical History:  Procedure Laterality Date   ANAL FISTULOTOMY N/A 11/30/2014   Procedure: ANAL FISTULOTOMY;  Surgeon: Dia Crawford III, MD;  Location: [facility];  Service: General;  Laterality: N/A;   BREAST BIOPSY Right 09/08/2018   affirm bx of mass, coil clip, ORGANIZING FAT NECROSIS AND ADJACENT CHANGES SUGGESTIVE OF RUPTURED CYST   BREAST BIOPSY Right 01/13/2021   PREDOMINANTLY BENIGN FIBROADIPOSE TISSUE SCANT FRAGMENTS OF UNREMARKABLE LYMPH NODE   CESAREAN SECTION     COLONOSCOPY  2014   CYSTOSCOPY N/A 05/14/2020   Procedure: CYSTOSCOPY;  Surgeon: Gae Dry, MD;  Location: [facility] OR[facility];  Service: Gynecology;  Laterality: N/A;   DILATION AND CURETTAGE OF UTERUS     ENDOMETRIAL ABLATION W/ NOVASURE  05/29/2009   Dr Kenton Kingfisher   left foot surgery     RECTAL EXAM UNDER ANESTHESIA N/A 11/30/2014   Procedure: RECTAL EXAM UNDER ANESTHESIA;  Surgeon: Dia Crawford III, MD;  Location: [facility] OR[facility];  Service: General;  Laterality: N/A;   TOTAL LAPAROSCOPIC HYSTERECTOMY WITH BILATERAL SALPINGO OOPHORECTOMY Bilateral 05/14/2020   LAPAROSCOPIC SUPRACERVICAL HYSTERECTOMY WITH BILATERAL SALPINGO OOPHORECTOMY;  Surgeon: Gae Dry, MD;  Location: [facility];  Service: Gynecology;  Laterality: Bilateral;     Social History   Tobacco Use   Smoking status: Never   Smokeless  tobacco: Never  Vaping Use   Vaping Use: Never used  Substance Use Topics   Alcohol use: No    Alcohol/week: 0.0 standard drinks of alcohol   Drug use: No       Family History  Problem Relation Age of Onset   Hypertension Mother    Heart disease Mother 37       "blockages"   Alzheimer's disease Mother    Diverticulitis Father    Hernia Father    Depression Father    Alzheimer's disease Father    Hypothyroidism Sister    Uterine cancer Sister 29   Ovarian cancer Sister    Breast cancer Maternal Aunt 64   Hypothyroidism Daughter    Hypertension Brother    Stroke Maternal Grandfather    Heart attack Maternal Grandfather    Stroke Paternal Grandmother    Hypothyroidism Sister    Ovarian cancer Sister    Hypothyroidism Daughter    Breast cancer Paternal Aunt 62   Breast cancer Other 30     Allergies  Allergen Reactions   Amlodipine     Other reaction(s): Other (See Comments) Ankle swelling   Oxycodone Nausea And Vomiting    No problems with Hydrocodone   Rosuvastatin Nausea Only   Scopolamine Other (See Comments)    "FELT LIKE I COULDN'T BREATHE"     REVIEW OF SYSTEMS (Negative unless checked)  Constitutional: '[]'$ Weight loss  '[]'$ Fever  '[]'$ Chills Cardiac: '[]'$ Chest pain   '[]'$ Chest pressure   '[]'$ Palpitations   '[]'$ Shortness of breath when laying flat   '[]'$ Shortness of breath at rest   '[]'$ Shortness of breath with exertion. Vascular:  '[]'$ Pain in legs with walking   '[]'$ Pain in legs at rest   '[]'$ Pain in legs when laying flat   '[]'$ Claudication   '[]'$ Pain in feet when walking  '[]'$ Pain in feet at rest  '[]'$ Pain in feet when laying flat   '[]'$ History of DVT   '[]'$ Phlebitis   '[x]'$ Swelling in legs   '[x]'$ Varicose veins   '[]'$ Non-healing ulcers Pulmonary:   '[]'$ Uses home oxygen   '[]'$ Productive cough   '[]'$ Hemoptysis   '[]'$ Wheeze  '[]'$ COPD   '[]'$ Asthma Neurologic:  '[]'$ Dizziness  '[]'$ Blackouts   '[]'$ Seizures   '[]'$ History of stroke   '[]'$ History of TIA  '[]'$ Aphasia   '[]'$ Temporary blindness   '[]'$ Dysphagia   '[]'$ Weakness or numbness in  arms   '[]'$ Weakness or numbness in legs Musculoskeletal:  '[]'$ Arthritis   '[x]'$ Joint swelling   '[]'$ Joint pain   '[]'$ Low back pain Hematologic:  '[]'$ Easy bruising  '[]'$ Easy bleeding   '[]'$ Hypercoagulable state   '[]'$ Anemic   Gastrointestinal:  '[]'$ Blood in stool   '[]'$ Vomiting blood  '[]'$ Gastroesophageal reflux/heartburn   '[]'$ Abdominal pain Genitourinary:  '[]'$ Chronic kidney disease   '[]'$ Difficult urination  '[]'$ Frequent urination  '[]'$ Burning with urination   '[]'$ Hematuria Skin:  '[]'$ Rashes   '[]'$ Ulcers   '[]'$ Wounds  Psychological:  '[x]'$ History of anxiety   '[]'$  History of major depression.  Physical Examination  BP 132/83 (BP Location: Left Arm)   Pulse 83   Resp 16   Wt 219 lb (99.3 kg)   BMI 34.30 kg/m  Gen:  WD/WN, NAD Head: [place]/AT, No temporalis wasting. Ear/Nose/Throat: Hearing grossly intact, nares w/o erythema or drainage Eyes: Conjunctiva clear. Sclera non-icteric Neck: Supple.  Trachea midline Pulmonary:  Good air movement, no use of accessory muscles.  Cardiac: RRR, no JVD Vascular: Diffuse varicosities worse on the right leg on the left. Vessel Right Left  Radial Palpable Palpable                          PT Palpable Palpable  DP Palpable Palpable   Gastrointestinal: soft, non-tender/non-distended. No guarding/reflex.  Musculoskeletal: M/S 5/5 throughout.  No deformity or atrophy. Trace RLE edema. Neurologic: Sensation grossly intact in extremities.  Symmetrical.  Speech is fluent.  Psychiatric: Judgment intact, Mood & affect appropriate for pt's clinical situation. Dermatologic: No rashes or ulcers noted.  No cellulitis or open wounds.      Labs Recent Results (from the past 2160 hour(s))  Urinalysis, Routine w reflex microscopic Urine, Clean Catch     Status: None   Collection Time: 03/04/22  2:42 PM  Result Value Ref Range   Color, Urine YELLOW YELLOW   APPearance CLEAR CLEAR   Specific Gravity, Urine 1.020 1.005 - 1.030   pH 6.5 5.0 - 8.0   Glucose, UA NEGATIVE NEGATIVE mg/dL   Hgb urine  dipstick NEGATIVE NEGATIVE   Bilirubin Urine NEGATIVE NEGATIVE   Ketones, ur NEGATIVE NEGATIVE mg/dL   Protein, ur NEGATIVE NEGATIVE mg/dL   Nitrite NEGATIVE NEGATIVE   Leukocytes,Ua NEGATIVE NEGATIVE    Comment: Microscopic not done on urines with negative protein, blood, leukocytes, nitrite, or glucose < 500 mg/dL. Performed at [facility], [address], [place], [place] [address]   Wet prep, genital     Status: Abnormal   Collection Time: 03/04/22  4:33 PM   Specimen: Vaginal  Result Value Ref Range   Yeast Wet Prep HPF POC NONE SEEN NONE SEEN   Trich, Wet Prep NONE SEEN NONE SEEN   Clue Cells Wet Prep HPF POC NONE SEEN NONE SEEN   WBC, Wet Prep HPF POC <10 (A) <10   Sperm NONE SEEN     Comment: Performed at [facility], [address], [place], [place] [address]    Radiology No results found.  Assessment/Plan  Varicose veins of leg with pain, right Patient has right great saphenous vein reflux and symptoms refractory to appropriate conservative therapies as above.  Given that finding, I would recommend laser ablation of the right great saphenous vein due to her symptomatic reflux.  Discussed the risks and benefits of the procedure.  I discussed the reason and rationale for treatment.  She voices her understanding and desires to proceed with right great saphenous vein laser ablation.  Hypertension goal BP (blood pressure) < 140/90 blood pressure control important in reducing the progression of atherosclerotic disease. On appropriate oral medications.    Leotis Pain, MD  03/27/2022 11:10 AM    This note was created with Dragon medical transcription system.  Any errors from dictation are purely unintentional

## 2022-03-27 NOTE — Assessment & Plan Note (Signed)
blood pressure control important in reducing the progression of atherosclerotic disease. On appropriate oral medications.  

## 2022-04-13 ENCOUNTER — Ambulatory Visit (INDEPENDENT_AMBULATORY_CARE_PROVIDER_SITE_OTHER): Payer: BC Managed Care – PPO | Admitting: Nurse Practitioner

## 2022-04-20 ENCOUNTER — Telehealth: Payer: Self-pay

## 2022-04-20 NOTE — Telephone Encounter (Signed)
Patient left a voicemail stating she has a colonoscopy schedule for 05/04/2022 and needs to reschedule the procedure. Return patient call and informed her she is schedule with [facility] gave her the number to call her to get the colonoscopy reschedule

## 2022-06-01 ENCOUNTER — Ambulatory Visit
Admission: RE | Admit: 2022-06-01 | Discharge: 2022-06-01 | Disposition: A | Discharge status: Home / Self Care | Payer: BC Managed Care – PPO | Source: Ambulatory Visit | Attending: Gerontology | Admitting: Gerontology

## 2022-06-01 DIAGNOSIS — Z1231 Encounter for screening mammogram for malignant neoplasm of breast: Secondary | ICD-10-CM | POA: Diagnosis present

## 2022-06-05 ENCOUNTER — Encounter: Payer: Self-pay | Admitting: Dietician

## 2022-06-05 ENCOUNTER — Encounter: Payer: BC Managed Care – PPO | Attending: Gerontology | Admitting: Dietician

## 2022-06-05 VITALS — Ht 67.0 in | Wt 218.6 lb

## 2022-06-05 DIAGNOSIS — E785 Hyperlipidemia, unspecified: Secondary | ICD-10-CM

## 2022-06-05 DIAGNOSIS — E6609 Other obesity due to excess calories: Secondary | ICD-10-CM

## 2022-06-05 DIAGNOSIS — I10 Essential (primary) hypertension: Secondary | ICD-10-CM | POA: Diagnosis not present

## 2022-06-05 DIAGNOSIS — E039 Hypothyroidism, unspecified: Secondary | ICD-10-CM

## 2022-06-05 NOTE — Progress Notes (Signed)
Medical Nutrition Therapy: Visit start time: 1100  end time: 1210  Assessment:   Referral Diagnosis: obesity Other medical history/ diagnoses: HTN, hyperlipidemia, hypothyroidism Psychosocial issues/ stress concerns: none  Medications, supplements: reconciled list in medical record   Preferred learning method:  Hands-on    Current weight: 218.6lbs Height: 5'7" BMI: 34.24 Patient's personal weight goal: 170lbs  Progress and evaluation:  Patient reports tendency for BP to get elevated during middle of day, does well when getting off work 11:30pm  Husband tells patient does not eat much; she feels portion control is not an issue for her. Most recent lab results available: 05/12/21 HbA1C 6.0%, total cholesterol 212, HDL 55.7, LDL 131, triglycerides 126 Food allergies: no Special diet practices: none Patient seeks help with improving eating habits and controlling BP Next PCP appt is 06/2022   Dietary Intake:  Usual eating pattern includes 3 meals and 3 snacks per day. Dining out frequency: 4 meals per week. Who plans meals/ buys groceries? self Who prepares meals? self Works 2nd shift, sleeps 2am-10am Breakfast: loves cereal raisin bran, cinn toast crunch (not routine) Snack: none Lunch: before 2pm -- 3/21 chick fila sandwich (ate 1/2) 3/4 of waffle fries, lemonade and tea mixed; kids meal at [facility]; usually at home Snack: cheese ie mozzarella sticks, cracker barrel slices with townhouse crackers Supper: usually at work 3/21 catered meal from [employer] at sm portion baked ziti, salad, 1 breadstick; lasagna, chicken casserole -- often one-dish meals  Snack (1-2): hungry when arriving home about 12am cereal; burger; crackers and cheese, grapes; snack cake (pb&choc wafers) Beverages: little water, skim milk, coffee with flavored creamer, milos tea and lemonade drink; ginger ale 2 daily plans to not replenish when gone  Physical activity: no regular exercise, on feet, moving at work  7+ hours in evenings   Intervention:   Nutrition Care Education:   Basic nutrition: basic food groups; appropriate nutrient balance; appropriate meal and snack schedule; general nutrition guidelines    Weight control: importance of low sugar and low fat choices; portion control; estimated energy needs for weight loss at 1300-1400 kcal, provided guidance for 45% CHO, 25% pro, 30% fat Advanced nutrition: cooking techniques; dining out Hypertension: identifying high sodium foods, goal for sodium intake; identifying food sources of potassium, magnesium; options for seasoning foods Hyperlipidemia: healthy and unhealthy fats; role of fiber, plant sterols Prediabetes: appropriate carb intake and balance, healthy carb choices, benefits of protein and fiber sources with meals and snacks, role of physical activity   Other intervention notes: Patient voices readiness to work on diet changes; she has made some changes to reduce sugar sweetened drinks. Established goals for change with direction from patient.   Nutritional Diagnosis:  [place]-2.1 Inpaired nutrition utilization and [place]-2.2 Altered nutrition-related laboratory As related to hypertension, hyperlipidemia, prediabetes.  As evidenced by patient with BP and blood lipids controlled with medications, elevated HbA1C. -3.3 Overweight/obesity As related to hypothyroidsim, excess calories from frequent restaurant meals and sugar sweetened beverages.  As evidenced by patient with current BMI of 34.   Education Materials given:  General diet guidelines for Cholesterol-lowering/ Heart health Foods to Choose to Lower Cholesterol Plate Planner with food lists, sample meal pattern Sample menus Visit summary with goals/ instructions   Learner/ who was taught:  Patient   Level of understanding: Verbalizes/ demonstrates competency  Demonstrated degree of understanding via:   Teach back Learning barriers: None  Willingness to learn/ readiness for  change: Eager, change in progress  Monitoring and Evaluation:  Dietary intake, exercise, BP and BG control, blood lipids, and body weight      follow up:  08/24/22 at 2:00pm

## 2022-06-05 NOTE — Patient Instructions (Signed)
Plan to have at least one veg or fruit with each meal, increase gradually to avoid GI distress. Try Beano tablets with meals if needed Make healthy options convenient ie visible in fridge, precut, etc, Continue to decrease sugar in beverages, increase water and add sugar free flavoring as needed. Try mixing low sugar juice drink with sparkling Try Yummly app for other healthy recipe ideas (filter for "healthy" recipes)

## 2022-07-20 ENCOUNTER — Other Ambulatory Visit: Payer: Self-pay | Admitting: Gerontology

## 2022-07-20 DIAGNOSIS — R1903 Right lower quadrant abdominal swelling, mass and lump: Secondary | ICD-10-CM

## 2022-07-23 ENCOUNTER — Ambulatory Visit
Admission: RE | Admit: 2022-07-23 | Discharge: 2022-07-23 | Disposition: A | Discharge status: Home / Self Care | Payer: BC Managed Care – PPO | Source: Ambulatory Visit | Attending: Gerontology | Admitting: Gerontology

## 2022-07-23 DIAGNOSIS — R1903 Right lower quadrant abdominal swelling, mass and lump: Secondary | ICD-10-CM | POA: Insufficient documentation

## 2022-07-29 ENCOUNTER — Ambulatory Visit: Payer: BC Managed Care – PPO | Admitting: Obstetrics and Gynecology

## 2022-07-29 DIAGNOSIS — Z01419 Encounter for gynecological examination (general) (routine) without abnormal findings: Secondary | ICD-10-CM

## 2022-08-03 ENCOUNTER — Ambulatory Visit: Payer: BC Managed Care – PPO | Admitting: Anesthesiology

## 2022-08-03 ENCOUNTER — Encounter
Admission: RE | Disposition: A | Discharge status: Home / Self Care | Payer: Self-pay | Source: Home / Self Care | Attending: Gastroenterology

## 2022-08-03 ENCOUNTER — Ambulatory Visit
Admission: RE | Admit: 2022-08-03 | Discharge: 2022-08-03 | Disposition: A | Discharge status: Home / Self Care | Payer: BC Managed Care – PPO | Attending: Gastroenterology | Admitting: Gastroenterology

## 2022-08-03 ENCOUNTER — Encounter: Payer: Self-pay | Admitting: Gastroenterology

## 2022-08-03 DIAGNOSIS — D123 Benign neoplasm of transverse colon: Secondary | ICD-10-CM | POA: Insufficient documentation

## 2022-08-03 DIAGNOSIS — D125 Benign neoplasm of sigmoid colon: Secondary | ICD-10-CM | POA: Diagnosis not present

## 2022-08-03 DIAGNOSIS — Z1211 Encounter for screening for malignant neoplasm of colon: Secondary | ICD-10-CM | POA: Insufficient documentation

## 2022-08-03 DIAGNOSIS — Z79899 Other long term (current) drug therapy: Secondary | ICD-10-CM | POA: Insufficient documentation

## 2022-08-03 DIAGNOSIS — D124 Benign neoplasm of descending colon: Secondary | ICD-10-CM | POA: Diagnosis not present

## 2022-08-03 DIAGNOSIS — G4730 Sleep apnea, unspecified: Secondary | ICD-10-CM | POA: Diagnosis not present

## 2022-08-03 DIAGNOSIS — E063 Autoimmune thyroiditis: Secondary | ICD-10-CM | POA: Insufficient documentation

## 2022-08-03 DIAGNOSIS — K635 Polyp of colon: Secondary | ICD-10-CM | POA: Insufficient documentation

## 2022-08-03 DIAGNOSIS — K5730 Diverticulosis of large intestine without perforation or abscess without bleeding: Secondary | ICD-10-CM | POA: Insufficient documentation

## 2022-08-03 DIAGNOSIS — I10 Essential (primary) hypertension: Secondary | ICD-10-CM | POA: Diagnosis not present

## 2022-08-03 DIAGNOSIS — K621 Rectal polyp: Secondary | ICD-10-CM | POA: Insufficient documentation

## 2022-08-03 HISTORY — PX: COLONOSCOPY WITH PROPOFOL: SHX5780

## 2022-08-03 SURGERY — COLONOSCOPY WITH PROPOFOL
Anesthesia: General

## 2022-08-03 MED ORDER — PROPOFOL 1000 MG/100ML IV EMUL
INTRAVENOUS | Status: AC
  Filled 2022-08-03: qty 100

## 2022-08-03 MED ORDER — SODIUM CHLORIDE 0.9 % IV SOLN
INTRAVENOUS | Status: DC
  Administered 2022-08-03: 20 mL/h via INTRAVENOUS

## 2022-08-03 MED ORDER — PHENYLEPHRINE 80 MCG/ML (10ML) SYRINGE FOR IV PUSH (FOR BLOOD PRESSURE SUPPORT)
PREFILLED_SYRINGE | INTRAVENOUS | Status: AC
  Filled 2022-08-03: qty 10

## 2022-08-03 MED ORDER — PROPOFOL 500 MG/50ML IV EMUL
INTRAVENOUS | Status: DC | PRN
  Administered 2022-08-03: 50 mg via INTRAVENOUS
  Administered 2022-08-03: 150 ug/kg/min via INTRAVENOUS

## 2022-08-03 MED ORDER — PHENYLEPHRINE HCL (PRESSORS) 10 MG/ML IV SOLN
INTRAVENOUS | Status: DC | PRN
  Administered 2022-08-03 (×2): 80 ug via INTRAVENOUS

## 2022-08-03 NOTE — Transfer of Care (Signed)
Immediate Anesthesia Transfer of Care Note  Patient: Desiree Walker  Procedure(s) Performed: COLONOSCOPY WITH PROPOFOL  Patient Location: PACU  Anesthesia Type:General  Level of Consciousness: awake and alert   Airway & Oxygen Therapy: Patient Spontanous Breathing and Patient connected to nasal cannula oxygen  Post-op Assessment: Report given to RN and Post -op Vital signs reviewed and stable  Post vital signs: Reviewed and stable  Last Vitals:  Vitals Value Taken Time  BP 99/45 08/03/22 0801  Temp 35.6 C 08/03/22 0800  Pulse 79 08/03/22 0802  Resp 16 08/03/22 0801  SpO2 97 % 08/03/22 0802  Vitals shown include unvalidated device data.  Last Pain:  Vitals:   08/03/22 0800  TempSrc: Temporal  PainSc: Asleep         Complications: No notable events documented.

## 2022-08-03 NOTE — H&P (Signed)
Pre-Procedure H&P   Patient ID: Desiree Walker is a 62 y.o. female.  Gastroenterology Provider: Jaynie Collins, DO  Referring Provider: Leona Carry, NP PCP: Luciana Axe, NP  Date: 08/03/2022  HPI [name] is a 62 y.o. female who presents today for Colonoscopy for Colorectal cancer screening.  Patient undergoing screening colonoscopy.  Last underwent colonoscopy and February 2013 which was normal.  Patient reports normal bowel movements without melena hematochezia diarrhea or constipation.  No family history of colon cancer or colon polyps.  He underwent a fistulotomy in September 2016.  Has also undergone hysterectomy and C-section.  Most recent lab work hemoglobin 13.5 MCV 83 platelets 230,000 creatinine 0.8   Past Medical History:  Diagnosis Date   Anemia    H/O   Anxiety    Arthritis    BRCA negative 08/2018   MyRisk neg except NTHL1 VUS   Complication of anesthesia    Family history of breast cancer 08/2018   IBIS=8.65/riskscore=7.9%   Family history of ovarian cancer    MyRisk neg except NTHL1 VUS   GERD (gastroesophageal reflux disease)    NO MEDS   Hyperlipidemia    Hypertension    Hypothyroidism    Hashimoto's per patient   IFG (impaired fasting glucose) 08/05/2015   Metabolic syndrome 10/13/2016   Palpitations 2012   Evaluated by Julien Nordmann, MD, Holter moniter   PONV (postoperative nausea and vomiting)    Pre-diabetes    Rectal mass    Thyroid disease    unspecified hypothyroidism    Past Surgical History:  Procedure Laterality Date   ANAL FISTULOTOMY N/A 11/30/2014   Procedure: ANAL FISTULOTOMY;  Surgeon: Tiney Rouge III, MD;  Location: [facility] ORS;  Service: General;  Laterality: N/A;   BREAST BIOPSY Right 09/08/2018   affirm bx of mass, coil clip, ORGANIZING FAT NECROSIS AND ADJACENT CHANGES SUGGESTIVE OF RUPTURED CYST   BREAST BIOPSY Right 01/13/2021   PREDOMINANTLY BENIGN FIBROADIPOSE TISSUE SCANT FRAGMENTS OF  UNREMARKABLE LYMPH NODE   CESAREAN SECTION     COLONOSCOPY  2014   CYSTOSCOPY N/A 05/14/2020   Procedure: CYSTOSCOPY;  Surgeon: Nadara Mustard, MD;  Location: [facility] ORS;  Service: Gynecology;  Laterality: N/A;   DILATION AND CURETTAGE OF UTERUS     ENDOMETRIAL ABLATION W/ NOVASURE  05/29/2009   Dr Tiburcio Pea   left foot surgery     RECTAL EXAM UNDER ANESTHESIA N/A 11/30/2014   Procedure: RECTAL EXAM UNDER ANESTHESIA;  Surgeon: Tiney Rouge III, MD;  Location: [facility] ORS;  Service: General;  Laterality: N/A;   TOTAL LAPAROSCOPIC HYSTERECTOMY WITH BILATERAL SALPINGO OOPHORECTOMY Bilateral 05/14/2020   LAPAROSCOPIC SUPRACERVICAL HYSTERECTOMY WITH BILATERAL SALPINGO OOPHORECTOMY;  Surgeon: Nadara Mustard, MD;  Location: [facility] ORS;  Service: Gynecology;  Laterality: Bilateral;    Family History No h/o GI disease or malignancy  Review of Systems  Constitutional:  Negative for activity change, appetite change, chills, diaphoresis, fatigue, fever and unexpected weight change.  HENT:  Negative for trouble swallowing and voice change.   Respiratory:  Negative for shortness of breath and wheezing.   Cardiovascular:  Negative for chest pain, palpitations and leg swelling.  Gastrointestinal:  Negative for abdominal distention, abdominal pain, anal bleeding, blood in stool, constipation, diarrhea, nausea, rectal pain and vomiting.  Musculoskeletal:  Negative for arthralgias and myalgias.  Skin:  Negative for color change and pallor.  Neurological:  Negative for dizziness, syncope and weakness.  Psychiatric/Behavioral:  Negative for confusion.   All other  systems reviewed and are negative.    Medications No current facility-administered medications on file prior to encounter.   Current Outpatient Medications on File Prior to Encounter  Medication Sig Dispense Refill   DUPIXENT 300 MG/2ML SOPN Inject 300 mg into the skin every 14 (fourteen) days.     levothyroxine (SYNTHROID) 112 MCG tablet TAKE ONE  TABLET DAILY. EVERY SUNDAY TAKE AN EXTRA ONE-HALF TABLET. (Patient taking differently: Take 112-168 mcg by mouth See admin instructions. Take 1.5 tablets (168 mcg) by mouth on Sundays & take 1 tablet (112 mcg) by mouth on all other days.) 90 tablet 3   losartan-hydrochlorothiazide (HYZAAR) 100-12.5 MG tablet Take 1 tablet by mouth daily.     vitamin B-12 (CYANOCOBALAMIN) 1000 MCG tablet Take by mouth. (Patient not taking: Reported on 06/05/2022)      Pertinent medications related to GI and procedure were reviewed by me with the patient prior to the procedure   Current Facility-Administered Medications:    0.9 %  sodium chloride infusion, , Intravenous, Continuous, Jaynie Collins, DO, Last Rate: 20 mL/hr at 08/03/22 0705, 20 mL/hr at 08/03/22 0705  sodium chloride 20 mL/hr (08/03/22 0705)       Allergies  Allergen Reactions   Latex Rash   Amlodipine     Other reaction(s): Other (See Comments) Ankle swelling   Oxycodone Nausea And Vomiting    No problems with Hydrocodone   Rosuvastatin Nausea Only   Scopolamine Other (See Comments)    "FELT LIKE I COULDN'T BREATHE"   Allergies were reviewed by me prior to the procedure  Objective   Body mass index is 34.17 kg/m. Vitals:   08/03/22 0650  BP: (!) 165/100  Pulse: (!) 103  Resp: 20  Temp: (!) 96.2 F (35.7 C)  TempSrc: Temporal  SpO2: 100%  Weight: 99 kg  Height: 5\' 7"  (1.702 m)     Physical Exam Vitals and nursing note reviewed.  Constitutional:      General: She is not in acute distress.    Appearance: Normal appearance. She is obese. She is not ill-appearing, toxic-appearing or diaphoretic.  HENT:     Head: Normocephalic and atraumatic.     Nose: Nose normal.     Mouth/Throat:     Mouth: Mucous membranes are moist.     Pharynx: Oropharynx is clear.  Eyes:     General: No scleral icterus.    Extraocular Movements: Extraocular movements intact.  Cardiovascular:     Rate and Rhythm: Normal rate and regular  rhythm.     Heart sounds: Normal heart sounds. No murmur heard.    No friction rub. No gallop.  Pulmonary:     Effort: Pulmonary effort is normal. No respiratory distress.     Breath sounds: Normal breath sounds. No wheezing, rhonchi or rales.  Abdominal:     General: Bowel sounds are normal. There is no distension.     Palpations: Abdomen is soft.     Tenderness: There is no abdominal tenderness. There is no guarding or rebound.  Musculoskeletal:     Cervical back: Neck supple.     Right lower leg: No edema.     Left lower leg: No edema.  Skin:    General: Skin is warm and dry.     Coloration: Skin is not jaundiced or pale.  Neurological:     General: No focal deficit present.     Mental Status: She is alert and oriented to person, place, and time. Mental status is  at baseline.  Psychiatric:        Mood and Affect: Mood normal.        Behavior: Behavior normal.        Thought Content: Thought content normal.        Judgment: Judgment normal.      Assessment:  [name] is a 62 y.o. female  who presents today for Colonoscopy for Colorectal cancer screening .  Plan:  Colonoscopy with possible intervention today  Colonoscopy with possible biopsy, control of bleeding, polypectomy, and interventions as necessary has been discussed with the patient/patient representative. Informed consent was obtained from the patient/patient representative after explaining the indication, nature, and risks of the procedure including but not limited to death, bleeding, perforation, missed neoplasm/lesions, cardiorespiratory compromise, and reaction to medications. Opportunity for questions was given and appropriate answers were provided. Patient/patient representative has verbalized understanding is amenable to undergoing the procedure.   Jaynie Collins, DO  [facility]  Portions of the record may have been created with voice recognition software. Occasional  wrong-word or 'sound-a-like' substitutions may have occurred due to the inherent limitations of voice recognition software.  Read the chart carefully and recognize, using context, where substitutions may have occurred.

## 2022-08-03 NOTE — Op Note (Signed)
[facility] Gastroenterology Patient Name: Desiree Walker Procedure Date: 08/03/2022 7:28 AM MRN: [id number] Account #: [id number] Date of Birth: 1961-01-20 Admit Type: Outpatient Age: 62 Room: [facility] ENDO ROOM 2 Gender: Female Note Status: Finalized Instrument Name: Colonscope [id number] Procedure:             Colonoscopy Indications:           Screening for colorectal malignant neoplasm Providers:             Trenda Moots, DO Referring MD:          Jaynie Collins DO, DO (Referring MD), No Local                         Md, MD (Referring MD) Medicines:             Monitored Anesthesia Care Complications:         No immediate complications. Estimated blood loss:                         Minimal. Procedure:             Pre-Anesthesia Assessment:                        - Prior to the procedure, a History and Physical was                         performed, and patient medications and allergies were                         reviewed. The patient is competent. The risks and                         benefits of the procedure and the sedation options and                         risks were discussed with the patient. All questions                         were answered and informed consent was obtained.                         Patient identification and proposed procedure were                         verified by the physician, the nurse, the anesthetist                         and the technician in the endoscopy suite. Mental                         Status Examination: alert and oriented. Airway                         Examination: normal oropharyngeal airway and neck                         mobility. Respiratory Examination: clear to  auscultation. CV Examination: RRR, no murmurs, no S3                         or S4. Prophylactic Antibiotics: The patient does not                         require prophylactic antibiotics. Prior                          Anticoagulants: The patient has taken no anticoagulant                         or antiplatelet agents. ASA Grade Assessment: III - A                         patient with severe systemic disease. After reviewing                         the risks and benefits, the patient was deemed in                         satisfactory condition to undergo the procedure. The                         anesthesia plan was to use monitored anesthesia care                         (MAC). Immediately prior to administration of                         medications, the patient was re-assessed for adequacy                         to receive sedatives. The heart rate, respiratory                         rate, oxygen saturations, blood pressure, adequacy of                         pulmonary ventilation, and response to care were                         monitored throughout the procedure. The physical                         status of the patient was re-assessed after the                         procedure.                        After obtaining informed consent, the colonoscope was                         passed under direct vision. Throughout the procedure,                         the patient's blood pressure, pulse, and oxygen  saturations were monitored continuously. The                         Colonoscope was introduced through the anus and                         advanced to the the cecum, identified by appendiceal                         orifice and ileocecal valve. The colonoscopy was                         performed without difficulty. The patient tolerated                         the procedure well. The quality of the bowel                         preparation was evaluated using the BBPS [facility] Bowel                         Preparation Scale) with scores of: Right Colon = 3,                         Transverse Colon = 3 and Left Colon = 3 (entire mucosa                          seen well with no residual staining, small fragments                         of stool or opaque liquid). The total BBPS score                         equals 9. The ileocecal valve, appendiceal orifice,                         and rectum were photographed. Findings:      The perianal and digital rectal examinations were normal. Pertinent       negatives include normal sphincter tone.      A few small-mouthed diverticula were found in the recto-sigmoid colon       and sigmoid colon. Estimated blood loss: none.      Three sessile polyps were found in the descending colon (2) and       ascending colon (1). The polyps were 1 to 2 mm in size. These polyps       were removed with a jumbo cold forceps. Resection and retrieval were       complete. Estimated blood loss was minimal.      Four sessile polyps were found in the rectum, sigmoid colon, transverse       colon and ascending colon. The polyps were 3 to 6 mm in size. These       polyps were removed with a cold snare. Resection and retrieval were       complete. Estimated blood loss was minimal.      A 7 to 9 mm polyp was found in the transverse colon. The polyp was       sessile. The polyp was  removed with a cold snare. Resection and       retrieval were complete. Estimated blood loss was minimal. To prevent       bleeding after the polypectomy, one hemostatic clip was successfully       placed (MR conditional). There was no bleeding at the end of the       procedure. Estimated blood loss was minimal.      The exam was otherwise without abnormality on direct and retroflexion       views. Impression:            - Diverticulosis in the recto-sigmoid colon and in the                         sigmoid colon.                        - Three 1 to 2 mm polyps in the descending colon and                         in the ascending colon, removed with a jumbo cold                         forceps. Resected and retrieved.                        - Four 3 to  6 mm polyps in the rectum, in the sigmoid                         colon, in the transverse colon and in the ascending                         colon, removed with a cold snare. Resected and                         retrieved.                        - One 7 to 9 mm polyp in the transverse colon, removed                         with a cold snare. Resected and retrieved. Clip (MR                         conditional) was placed.                        - The examination was otherwise normal on direct and                         retroflexion views. Recommendation:        - Patient has a contact number available for                         emergencies. The signs and symptoms of potential                         delayed complications were discussed with the patient.  Return to normal activities tomorrow. Written                         discharge instructions were provided to the patient.                        - Discharge patient to home.                        - Resume previous diet.                        - Continue present medications.                        - Await pathology results.                        - No ibuprofen, naproxen, or other non-steroidal                         anti-inflammatory drugs for 5 days after polyp removal.                        - Repeat colonoscopy for surveillance based on                         pathology results.                        - Return to referring physician as previously                         scheduled.                        - The findings and recommendations were discussed with                         the patient. Procedure Code(s):     --- Professional ---                        [phone], Colonoscopy, flexible; with removal of                         tumor(s), polyp(s), or other lesion(s) by snare                         technique                        45380, 59, Colonoscopy, flexible; with biopsy, single                          or multiple Diagnosis Code(s):     --- Professional ---                        Z12.11, Encounter for screening for malignant neoplasm                         of colon  D12.4, Benign neoplasm of descending colon                        D12.8, Benign neoplasm of rectum                        D12.5, Benign neoplasm of sigmoid colon                        D12.3, Benign neoplasm of transverse colon (hepatic                         flexure or splenic flexure)                        D12.2, Benign neoplasm of ascending colon                        K57.30, Diverticulosis of large intestine without                         perforation or abscess without bleeding CPT copyright 2022 American [employer] Association. All rights reserved. The codes documented in this report are preliminary and upon coder review may  be revised to meet current compliance requirements. Attending Participation:      I personally performed the entire procedure. Elfredia Nevins, DO Jaynie Collins DO, DO 08/03/2022 8:04:08 AM This report has been signed electronically. Number of Addenda: 0 Note Initiated On: 08/03/2022 7:28 AM Scope Withdrawal Time: 0 hours 21 minutes 33 seconds  Total Procedure Duration: 0 hours 23 minutes 30 seconds  Estimated Blood Loss:  Estimated blood loss was minimal.      [facility]

## 2022-08-03 NOTE — Anesthesia Preprocedure Evaluation (Signed)
Anesthesia Evaluation  Patient identified by MRN, date of birth, ID band Patient awake    Reviewed: Allergy & Precautions, H&P , NPO status , Patient's Chart, lab work & pertinent test results, reviewed documented beta blocker date and time   History of Anesthesia Complications (+) PONV and history of anesthetic complications  Airway Mallampati: II   Neck ROM: full    Dental  (+) Poor Dentition   Pulmonary sleep apnea and Continuous Positive Airway Pressure Ventilation    Pulmonary exam normal        Cardiovascular Exercise Tolerance: Good hypertension, On Medications negative cardio ROS Normal cardiovascular exam Rhythm:regular Rate:Normal     Neuro/Psych   Anxiety     negative neurological ROS  negative psych ROS   GI/Hepatic Neg liver ROS,GERD  Medicated,,  Endo/Other  Hypothyroidism    Renal/GU negative Renal ROS  negative genitourinary   Musculoskeletal   Abdominal   Peds  Hematology  (+) Blood dyscrasia, anemia   Anesthesia Other Findings Past Medical History: No date: Anemia     Comment:  H/O No date: Anxiety No date: Arthritis 08/2018: BRCA negative     Comment:  MyRisk neg except NTHL1 VUS No date: Complication of anesthesia 08/2018: Family history of breast cancer     Comment:  IBIS=8.65/riskscore=7.9% No date: Family history of ovarian cancer     Comment:  MyRisk neg except NTHL1 VUS No date: GERD (gastroesophageal reflux disease)     Comment:  NO MEDS No date: Hyperlipidemia No date: Hypertension No date: Hypothyroidism     Comment:  Hashimoto's per patient 08/05/2015: IFG (impaired fasting glucose) 10/13/2016: Metabolic syndrome 2012: Palpitations     Comment:  Evaluated by Julien Nordmann, MD, Holter moniter No date: PONV (postoperative nausea and vomiting) No date: Pre-diabetes No date: Rectal mass No date: Thyroid disease     Comment:  unspecified hypothyroidism Past Surgical  History: 11/30/2014: ANAL FISTULOTOMY; N/A     Comment:  Procedure: ANAL FISTULOTOMY;  Surgeon: Tiney Rouge III,               MD;  Location: [facility];  Service: General;  Laterality:               N/A; 09/08/2018: BREAST BIOPSY; Right     Comment:  affirm bx of mass, coil clip, ORGANIZING FAT NECROSIS               AND ADJACENT CHANGES SUGGESTIVE OF RUPTURED CYST 01/13/2021: BREAST BIOPSY; Right     Comment:  PREDOMINANTLY BENIGN FIBROADIPOSE TISSUE SCANT FRAGMENTS              OF UNREMARKABLE LYMPH NODE No date: CESAREAN SECTION 2014: COLONOSCOPY 05/14/2020: CYSTOSCOPY; N/A     Comment:  Procedure: CYSTOSCOPY;  Surgeon: Nadara Mustard, MD;                Location: [facility];  Service: Gynecology;  Laterality:               N/A; No date: DILATION AND CURETTAGE OF UTERUS 05/29/2009: ENDOMETRIAL ABLATION W/ NOVASURE     Comment:  Dr Tiburcio Pea No date: left foot surgery 11/30/2014: RECTAL EXAM UNDER ANESTHESIA; N/A     Comment:  Procedure: RECTAL EXAM UNDER ANESTHESIA;  Surgeon: Tiney Rouge III, MD;  Location: [facility];  Service: General;  Laterality: N/A; 05/14/2020: TOTAL LAPAROSCOPIC HYSTERECTOMY WITH BILATERAL SALPINGO  OOPHORECTOMY; Bilateral     Comment:  LAPAROSCOPIC SUPRACERVICAL HYSTERECTOMY WITH BILATERAL               SALPINGO OOPHORECTOMY;  Surgeon: Nadara Mustard, MD;                Location: [facility];  Service: Gynecology;  Laterality:               Bilateral; BMI    Body Mass Index: 34.17 kg/m     Reproductive/Obstetrics negative OB ROS                             Anesthesia Physical Anesthesia Plan  ASA: 3  Anesthesia Plan: General   Post-op Pain Management:    Induction:   PONV Risk Score and Plan:   Airway Management Planned:   Additional Equipment:   Intra-op Plan:   Post-operative Plan:   Informed Consent: I have reviewed the patients History and Physical, chart, labs and discussed the  procedure including the risks, benefits and alternatives for the proposed anesthesia with the patient or authorized representative who has indicated his/her understanding and acceptance.     Dental Advisory Given  Plan Discussed with: CRNA  Anesthesia Plan Comments:        Anesthesia Quick Evaluation

## 2022-08-03 NOTE — Interval H&P Note (Signed)
History and Physical Interval Note: Preprocedure H&P from 08/03/22  was reviewed and there was no interval change after seeing and examining the patient.  Written consent was obtained from the patient after discussion of risks, benefits, and alternatives. Patient has consented to proceed with Colonoscopy with possible intervention   08/03/2022 7:20 AM  Desiree Walker  has presented today for surgery, with the diagnosis of colon cancer screening.  The various methods of treatment have been discussed with the patient and family. After consideration of risks, benefits and other options for treatment, the patient has consented to  Procedure(s): COLONOSCOPY WITH PROPOFOL (N/A) as a surgical intervention.  The patient's history has been reviewed, patient examined, no change in status, stable for surgery.  I have reviewed the patient's chart and labs.  Questions were answered to the patient's satisfaction.     Jaynie Collins

## 2022-08-04 ENCOUNTER — Encounter: Payer: Self-pay | Admitting: Gastroenterology

## 2022-08-04 NOTE — Anesthesia Postprocedure Evaluation (Signed)
Anesthesia Post Note  Patient: ALYSHEA HUSK  Procedure(s) Performed: COLONOSCOPY WITH PROPOFOL  Patient location during evaluation: PACU Anesthesia Type: General Level of consciousness: awake and alert Pain management: pain level controlled Vital Signs Assessment: post-procedure vital signs reviewed and stable Respiratory status: spontaneous breathing, nonlabored ventilation, respiratory function stable and patient connected to nasal cannula oxygen Cardiovascular status: blood pressure returned to baseline and stable Postop Assessment: no apparent nausea or vomiting Anesthetic complications: no   No notable events documented.   Last Vitals:  Vitals:   08/03/22 0800 08/03/22 0810  BP:  111/67  Pulse:    Resp: 16   Temp: (!) 35.6 C   SpO2: 97%     Last Pain:  Vitals:   08/03/22 0820  TempSrc:   PainSc: 0-No pain                 Yevette Edwards

## 2022-08-05 LAB — SURGICAL PATHOLOGY

## 2022-08-24 ENCOUNTER — Ambulatory Visit: Payer: BC Managed Care – PPO | Admitting: Dietician

## 2022-08-28 ENCOUNTER — Telehealth: Payer: Self-pay | Admitting: Emergency Medicine

## 2022-08-28 ENCOUNTER — Ambulatory Visit (INDEPENDENT_AMBULATORY_CARE_PROVIDER_SITE_OTHER): Payer: BC Managed Care – PPO

## 2022-08-28 ENCOUNTER — Ambulatory Visit
Admission: RE | Admit: 2022-08-28 | Discharge: 2022-08-28 | Disposition: A | Discharge status: Home / Self Care | Payer: BC Managed Care – PPO | Source: Ambulatory Visit

## 2022-08-28 VITALS — BP 168/77 | HR 99 | Temp 97.8°F | Ht 67.0 in | Wt 220.0 lb

## 2022-08-28 DIAGNOSIS — E876 Hypokalemia: Secondary | ICD-10-CM | POA: Diagnosis not present

## 2022-08-28 DIAGNOSIS — E871 Hypo-osmolality and hyponatremia: Secondary | ICD-10-CM | POA: Diagnosis not present

## 2022-08-28 DIAGNOSIS — R0602 Shortness of breath: Secondary | ICD-10-CM

## 2022-08-28 LAB — COMPREHENSIVE METABOLIC PANEL
ALT: 16 U/L (ref 0–44)
AST: 22 U/L (ref 15–41)
Albumin: 3.9 g/dL (ref 3.5–5.0)
Alkaline Phosphatase: 68 U/L (ref 38–126)
Anion gap: 12 (ref 5–15)
BUN: 16 mg/dL (ref 8–23)
CO2: 23 mmol/L (ref 22–32)
Calcium: 8.6 mg/dL — ABNORMAL LOW (ref 8.9–10.3)
Chloride: 98 mmol/L (ref 98–111)
Creatinine, Ser: 0.88 mg/dL (ref 0.44–1.00)
GFR, Estimated: >60 mL/min (ref >60)
Glucose, Bld: 134 mg/dL — ABNORMAL HIGH (ref 70–99)
Potassium: 3 mmol/L — ABNORMAL LOW (ref 3.5–5.1)
Sodium: 133 mmol/L — ABNORMAL LOW (ref 135–145)
Total Bilirubin: 1 mg/dL (ref 0.3–1.2)
Total Protein: 7.1 g/dL (ref 6.5–8.1)

## 2022-08-28 LAB — CBC WITH DIFFERENTIAL/PLATELET
Abs Immature Granulocytes: 0.06 10*3/uL (ref 0.00–0.07)
Basophils Absolute: 0 10*3/uL (ref 0.0–0.1)
Basophils Relative: 1 %
Eosinophils Absolute: 0.1 10*3/uL (ref 0.0–0.5)
Eosinophils Relative: 1 %
HCT: 41.7 % (ref 36.0–46.0)
Hemoglobin: 14 g/dL (ref 12.0–15.0)
Immature Granulocytes: 1 %
Lymphocytes Relative: 38 %
Lymphs Abs: 2.5 10*3/uL (ref 0.7–4.0)
MCH: 26.8 pg (ref 26.0–34.0)
MCHC: 33.6 g/dL (ref 30.0–36.0)
MCV: 79.7 fL — ABNORMAL LOW (ref 80.0–100.0)
Monocytes Absolute: 0.4 10*3/uL (ref 0.1–1.0)
Monocytes Relative: 6 %
Neutro Abs: 3.6 10*3/uL (ref 1.7–7.7)
Neutrophils Relative %: 53 %
Platelets: 244 10*3/uL (ref 150–400)
RBC: 5.23 MIL/uL — ABNORMAL HIGH (ref 3.87–5.11)
RDW: 14 % (ref 11.5–15.5)
WBC: 6.7 10*3/uL (ref 4.0–10.5)
nRBC: 0 % (ref 0.0–0.2)

## 2022-08-28 LAB — TROPONIN I (HIGH SENSITIVITY): Troponin I (High Sensitivity): 3 ng/L (ref <18)

## 2022-08-28 MED ORDER — ALBUTEROL SULFATE HFA 108 (90 BASE) MCG/ACT IN AERS: 2.0000 | INHALATION_SPRAY | RESPIRATORY_TRACT | 0 refills | Status: DC | PRN

## 2022-08-28 MED ORDER — BENZONATATE 100 MG PO CAPS: 200.0000 mg | ORAL_CAPSULE | Freq: Three times a day (TID) | ORAL | 0 refills | Status: DC

## 2022-08-28 MED ORDER — PROMETHAZINE-DM 6.25-15 MG/5ML PO SYRP: 5.0000 mL | ORAL_SOLUTION | Freq: Four times a day (QID) | ORAL | 0 refills | Status: DC | PRN

## 2022-08-28 MED ORDER — POTASSIUM CHLORIDE CRYS ER 20 MEQ PO TBCR: 20.0000 meq | EXTENDED_RELEASE_TABLET | Freq: Two times a day (BID) | ORAL | 0 refills | Status: DC

## 2022-08-28 MED ORDER — POTASSIUM CHLORIDE 10 % PO SOLN: 20.0000 meq | Freq: Two times a day (BID) | ORAL | 0 refills | Status: DC

## 2022-08-28 MED ORDER — AEROCHAMBER MV MISC: 2 refills | Status: DC

## 2022-08-28 NOTE — Discharge Instructions (Addendum)
Your chest x-ray did not demonstrate any evidence of pneumonia.  Your shortness of breath and fatigue may very well be secondary to your recent COVID and influenza infections.  The shortness of breath can last for 6 weeks or longer following a COVID infection.  Use your albuterol inhaler with a spacer, 2 puffs every 4-6 hours, as needed for any shortness of breath or wheezing you may have.  Use the Tessalon Perles every 8 hours during the day as needed for cough and use the Promethazine DM cough syrup at bedtime as needed.  The cough syrup will make you drowsy so be mindful of this if you have to get up in the middle night to use the bathroom.  Your potassium is also low, most likely secondary to one of your blood pressure medications, and this may be a cause of your lightheadedness.  Your sodium is also mildly low which could also cause you to feel lightheaded.  Take the potassium chloride replacement, 20 mg twice daily, for 3 days.  You need to make an appointment for next week to follow-up with your primary care provider to have your potassium levels rechecked.  If you develop any chest pain, worsening shortness of breath, dizziness, or fainting you need to go to the ER for evaluation.

## 2022-08-28 NOTE — Telephone Encounter (Signed)
Patient reports that she cannot swallow pills and is requesting liquid.  New prescription sent to the pharmacy.

## 2022-08-28 NOTE — ED Triage Notes (Signed)
Pt c/o possible pneumonia x2weeks Pt is having SOB, fatigue, light headed, nausea,   Pt states that she had flu and covid 1 week ago and took a home test 3 days ago and it was negative. Pt was tested at [facility] in [facility].

## 2022-08-28 NOTE — ED Provider Notes (Signed)
[facility]    CSN: [id number] Arrival date & time: 08/28/22  7829      History   Chief Complaint Chief Complaint  Patient presents with   Shortness of Breath   Fatigue    HPI Desiree Walker is a 62 y.o. female.   HPI  68 old female with a significant past medical history for thyroid disease, GERD, prediabetes, hyperlipidemia, hypothyroidism, hypertension, metabolic syndrome, and anemia presents for evaluation of 2 weeks worth of respiratory symptoms.  She reports that she has had shortness of breath, fatigue, and light headedness for the last 2 weeks.  1 week ago she was diagnosed with both COVID and influenza.  She reports that she took a home test for both 3 days ago that was negative.  2 days ago she developed pressure underneath her right breast and in her back and started to feel presyncopal.  She has not had a syncopal event.  She also complains of excessive thirst but not increased urination or weight loss.  She does state that she feels very weak and shaky.  Past Medical History:  Diagnosis Date   Anemia    H/O   Anxiety    Arthritis    BRCA negative 08/2018   MyRisk neg except NTHL1 VUS   Complication of anesthesia    Family history of breast cancer 08/2018   IBIS=8.65/riskscore=7.9%   Family history of ovarian cancer    MyRisk neg except NTHL1 VUS   GERD (gastroesophageal reflux disease)    NO MEDS   Hyperlipidemia    Hypertension    Hypothyroidism    Hashimoto's per patient   IFG (impaired fasting glucose) 08/05/2015   Metabolic syndrome 10/13/2016   Palpitations 2012   Evaluated by Julien Nordmann, MD, Holter moniter   PONV (postoperative nausea and vomiting)    Pre-diabetes    Rectal mass    Thyroid disease    unspecified hypothyroidism    Patient Active Problem List   Diagnosis Date Noted   Varicose veins of leg with pain, right 03/27/2022   Hypothyroidism due to Hashimoto's thyroiditis 06/12/2020   IGT (impaired glucose tolerance)  06/12/2020   Irritant contact dermatitis 06/12/2020   S/P laparoscopic supracervical hysterectomy 05/27/2020   Fibroid uterus 05/14/2020   Rotator cuff tendinitis, right 11/06/2019   Traumatic complete tear of right rotator cuff 11/06/2019   COVID-19 virus infection 03/14/2019   S/P breast biopsy, right 01/12/2019   Breast pain, right 01/12/2019   Family history of breast cancer 06/02/2018   Family history of ovarian cancer 06/02/2018   Eczema 06/10/2017   History of uterine fibroid 10/23/2016   Metabolic syndrome 10/13/2016   Medication monitoring encounter 10/13/2016   Pressure in chest 12/17/2015   Anxiety disorder 12/17/2015   Obesity 12/17/2015   Sleep apnea 08/29/2015   Snoring 08/27/2015   Vitamin D deficiency 08/19/2015   Leg cramps 08/05/2015   IFG (impaired fasting glucose) 08/05/2015   Anal fistula    Other specified symptoms and signs involving the digestive system and abdomen 10/31/2014   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   Bladder neoplasm of uncertain malignant potential 06/09/2013   Female genuine stress incontinence 06/05/2013   Incomplete bladder emptying 06/05/2013   Urge incontinence 06/05/2013   Dysuria 06/05/2013   Other chronic cystitis without hematuria 06/05/2013   Retention of urine 06/05/2013   RLQ abdominal pain 07/27/2012   Palpitations 09/11/2010   Hypothyroidism, adult 05/10/2009   Hyperlipidemia LDL goal <130 05/10/2009   Hypertension  goal BP (blood pressure) < 140/90 05/10/2009    Past Surgical History:  Procedure Laterality Date   ANAL FISTULOTOMY N/A 11/30/2014   Procedure: ANAL FISTULOTOMY;  Surgeon: Tiney Rouge III, MD;  Location: [facility];  Service: General;  Laterality: N/A;   BREAST BIOPSY Right 09/08/2018   affirm bx of mass, coil clip, ORGANIZING FAT NECROSIS AND ADJACENT CHANGES SUGGESTIVE OF RUPTURED CYST   BREAST BIOPSY Right 01/13/2021   PREDOMINANTLY BENIGN FIBROADIPOSE TISSUE SCANT FRAGMENTS OF  UNREMARKABLE LYMPH NODE   CESAREAN SECTION     COLONOSCOPY  2014   COLONOSCOPY WITH PROPOFOL N/A 08/03/2022   Procedure: COLONOSCOPY WITH PROPOFOL;  Surgeon: Jaynie Collins, DO;  Location: [facility];  Service: Gastroenterology;  Laterality: N/A;   CYSTOSCOPY N/A 05/14/2020   Procedure: CYSTOSCOPY;  Surgeon: Nadara Mustard, MD;  Location: [facility];  Service: Gynecology;  Laterality: N/A;   DILATION AND CURETTAGE OF UTERUS     ENDOMETRIAL ABLATION W/ NOVASURE  05/29/2009   Dr Tiburcio Pea   left foot surgery     RECTAL EXAM UNDER ANESTHESIA N/A 11/30/2014   Procedure: RECTAL EXAM UNDER ANESTHESIA;  Surgeon: Tiney Rouge III, MD;  Location: [facility];  Service: General;  Laterality: N/A;   TOTAL LAPAROSCOPIC HYSTERECTOMY WITH BILATERAL SALPINGO OOPHORECTOMY Bilateral 05/14/2020   LAPAROSCOPIC SUPRACERVICAL HYSTERECTOMY WITH BILATERAL SALPINGO OOPHORECTOMY;  Surgeon: Nadara Mustard, MD;  Location: [facility];  Service: Gynecology;  Laterality: Bilateral;    OB History     Gravida  4   Para  3   Term  3   Preterm      AB  1   Living  3      SAB  1   IAB      Ectopic      Multiple      Live Births  3        Obstetric Comments  1st Menstrual Cycle:  11 1st Pregnancy:  20          Home Medications    Prior to Admission medications   Medication Sig Start Date End Date Taking? Authorizing Provider  albuterol (VENTOLIN HFA) 108 (90 Base) MCG/ACT inhaler Inhale 2 puffs into the lungs every 4 (four) hours. 08/17/22  Yes [provider]  albuterol (VENTOLIN HFA) 108 (90 Base) MCG/ACT inhaler Inhale 2 puffs into the lungs every 4 (four) hours as needed. 08/28/22  Yes Becky Augusta, NP  benzonatate (TESSALON) 100 MG capsule Take 2 capsules (200 mg total) by mouth every 8 (eight) hours. 08/28/22  Yes Becky Augusta, NP  DUPIXENT 300 MG/2ML SOPN Inject 300 mg into the skin every 14 (fourteen) days. 04/01/20  Yes [provider]  estradiol (ESTRACE) 0.5 MG  tablet Take by mouth. 06/16/22  Yes [provider]  levothyroxine (SYNTHROID) 112 MCG tablet TAKE ONE TABLET DAILY. EVERY SUNDAY TAKE AN EXTRA ONE-HALF TABLET. Patient taking differently: Take 112-168 mcg by mouth See admin instructions. Take 1.5 tablets (168 mcg) by mouth on Sundays & take 1 tablet (112 mcg) by mouth on all other days. 01/18/19  Yes Doren Custard, FNP  losartan-hydrochlorothiazide (HYZAAR) 100-12.5 MG tablet Take 1 tablet by mouth daily. 11/03/21  Yes [provider]  naproxen (NAPROSYN) 500 MG tablet Take 1 tablet (500 mg total) by mouth 2 (two) times daily. 03/04/22  Yes Domenick Gong, MD  potassium chloride SA (KLOR-CON M) 20 MEQ tablet Take 1 tablet (20 mEq total) by mouth 2 (two) times daily for 3 days.  08/28/22 08/31/22 Yes Becky Augusta, NP  promethazine-dextromethorphan (PROMETHAZINE-DM) 6.25-15 MG/5ML syrup Take 5 mLs by mouth 4 (four) times daily as needed. 08/28/22  Yes Becky Augusta, NP  Spacer/Aero-Holding Chambers (AEROCHAMBER MV) inhaler Use as instructed 08/28/22  Yes Becky Augusta, NP  spironolactone (ALDACTONE) 25 MG tablet Take 1 tablet by mouth daily. 06/16/22  Yes [provider]  torsemide (DEMADEX) 5 MG tablet Take by mouth. 06/16/22  Yes [provider]  vitamin B-12 (CYANOCOBALAMIN) 1000 MCG tablet Take by mouth. 07/04/21  Yes [provider]    Family History Family History  Problem Relation Age of Onset   Hypertension Mother    Heart disease Mother 72       "blockages"   Alzheimer's disease Mother    Diverticulitis Father    Hernia Father    Depression Father    Alzheimer's disease Father    Hypothyroidism Sister    Uterine cancer Sister 48   Ovarian cancer Sister    Breast cancer Maternal Aunt 41   Hypothyroidism Daughter    Hypertension Brother    Stroke Maternal Grandfather    Heart attack Maternal Grandfather    Stroke Paternal Grandmother    Hypothyroidism Sister    Ovarian cancer Sister     Hypothyroidism Daughter    Breast cancer Paternal Aunt 31   Breast cancer Other 30    Social History Social History   Tobacco Use   Smoking status: Never   Smokeless tobacco: Never  Vaping Use   Vaping Use: Never used  Substance Use Topics   Alcohol use: No    Alcohol/week: 0.0 standard drinks of alcohol   Drug use: No     Allergies   Latex, Amlodipine, Oxycodone, Rosuvastatin, and Scopolamine   Review of Systems Review of Systems  Constitutional:  Positive for fatigue. Negative for fever and unexpected weight change.  HENT:  Positive for congestion, ear pain, rhinorrhea and sinus pressure. Negative for sore throat.   Respiratory:  Positive for cough and shortness of breath.   Endocrine: Positive for polydipsia. Negative for polyuria.  Neurological:  Negative for syncope.     Physical Exam Triage Vital Signs ED Triage Vitals  Enc Vitals Group     BP      Pulse      Resp      Temp      Temp src      SpO2      Weight      Height      Head Circumference      Peak Flow      Pain Score      Pain Loc      Pain Edu?      Excl. in GC?    No data found.  Updated Vital Signs BP (!) 168/77 (BP Location: Left Arm)   Pulse 99   Temp 97.8 F (36.6 C) (Oral)   Ht 5\' 7"  (1.702 m)   Wt 220 lb (99.8 kg)   SpO2 99%   BMI 34.46 kg/m   Visual Acuity Right Eye Distance:   Left Eye Distance:   Bilateral Distance:    Right Eye Near:   Left Eye Near:    Bilateral Near:     Physical Exam Vitals and nursing note reviewed.  Constitutional:      Appearance: Normal appearance. She is not ill-appearing.  HENT:     Head: Normocephalic and atraumatic.     Right Ear: Tympanic membrane, ear canal and  external ear normal. There is no impacted cerumen.     Left Ear: Tympanic membrane, ear canal and external ear normal. There is no impacted cerumen.     Nose: Congestion and rhinorrhea present.     Comments: Nasal mucosa is erythematous and edematous with dried  yellow-green discharge in the right nare.  There are some flecks of blood as well.  Patient does have tenderness to bilateral maxillary and frontal sinuses with compression. Cardiovascular:     Rate and Rhythm: Normal rate and regular rhythm.     Pulses: Normal pulses.     Heart sounds: Normal heart sounds. No murmur heard.    No friction rub. No gallop.  Pulmonary:     Effort: Pulmonary effort is normal.     Breath sounds: Normal breath sounds. No wheezing, rhonchi or rales.  Musculoskeletal:     Cervical back: Normal range of motion and neck supple. No tenderness.  Lymphadenopathy:     Cervical: No cervical adenopathy.  Skin:    General: Skin is warm and dry.     Capillary Refill: Capillary refill takes less than 2 seconds.     Findings: No rash.  Neurological:     General: No focal deficit present.     Mental Status: She is alert and oriented to person, place, and time.      UC Treatments / Results  Labs (all labs ordered are listed, but only abnormal results are displayed) Labs Reviewed  CBC WITH DIFFERENTIAL/PLATELET - Abnormal; Notable for the following components:      Result Value   RBC 5.23 (*)    MCV 79.7 (*)    All other components within normal limits  COMPREHENSIVE METABOLIC PANEL - Abnormal; Notable for the following components:   Sodium 133 (*)    Potassium 3.0 (*)    Glucose, Bld 134 (*)    Calcium 8.6 (*)    All other components within normal limits  TROPONIN I (HIGH SENSITIVITY)    EKG Normal sinus rhythm with ventricular rate of 97 bpm PR interval 196 ms QRS duration 86 ms QT/QTc 360/457 ms ST segment normality with rate of possible digitalis effect.  Radiology DG Chest 2 View  Result Date: 08/28/2022 CLINICAL DATA:  Cough and shortness of breath for 2 weeks with right-sided chest pressure for 2 days. EXAM: CHEST - 2 VIEW COMPARISON:  Chest radiographs 03/12/2019 and chest CT 12/12/2021 FINDINGS: The cardiomediastinal silhouette is unchanged with  normal heart size. The lungs are well inflated and clear. No pleural effusion or pneumothorax is identified. No acute osseous abnormality is seen. IMPRESSION: No active cardiopulmonary disease. Electronically Signed   By: Sebastian Ache M.D.   On: 08/28/2022 10:19    Procedures Procedures (including critical care time)  Medications Ordered in UC Medications - No data to display  Initial Impression / Assessment and Plan / UC Course  I have reviewed the triage vital signs and the nursing notes.  Pertinent labs & imaging results that were available during my care of the patient were reviewed by me and considered in my medical decision making (see chart for details).   Patient is a nontoxic-appearing 65 old female presenting for evaluation of respiratory complaints as outlined in HPI above.  She does not appear to be in any distress and she can speak in full sentence without dyspnea or tachypnea but she complains of feeling short of breath and states that she is having a productive cough for a thick green sputum.  Her room air oxygen saturation is 99% and she is afebrile with an oral temp of 97.8.  Heart rate is slightly elevated at 99.  Blood pressure is also slightly elevated at 168/77 the patient does have a history of hypertension.  She is complaining of pain underneath her right breast along with increasing shortness of breath and fatigue has been going on for the past 2 days.  I will order an EKG to evaluate for cardiac source of the symptoms.  I will also order chest x-ray given that patient is complaining of shortness of breath and productive cough in the setting of recent COVID and influenza infection.  Additionally, because of the polydipsia I will order a CMP.  Patient does have a history of prediabetes.  This will evaluate serum glucose as well as electrolytes, renal function, and transaminases.  CBC also to evaluate for anemia and/or systemic infection.  EKG shows normal sinus rhythm with  flattening of the ST segment in lead II and inversion in lead III.  Flattening in aVF.  Flattening of the ST segment in V2 when compared to EKG from 05/08/2020.  Chest x-ray independently reviewed and evaluated by me.  Impression: Patient has some patchy haziness in the right hilar area with prominent lung markings but no definitive infiltrate or effusion noted.  Heart size and mediastinal contours appear normal.  Radiology overread is pending. Radiology impression of chest x-ray states no active cardiopulmonary disease.  CBC shows a mildly elevated white count of 5.23 but H&H is normal at 14 and 41.7.  MCV is also mildly decreased at 79.7.  MCH and MCHC are normal at 26.8 and 33.6.  Platelets normal at 244.  No abnormalities to the differential.  CMP shows mild hyponatremia with a sodium of 133, hypokalemia with a potassium of 3.0, and hypocalcemia with a calcium of 8.6.  Renal function and transaminases are unremarkable.  Troponin is 3.  I suspect that some of the patient's lightheadedness may be secondary to her potassium being low.  I will replace her potassium with 20 mEq of potassium twice daily x 3 days.  She is to see her PCP next week to have a potassium level rechecked.  Her ongoing short of breath may also be secondary to recent COVID infection.  I will prescribe an albuterol inhaler that she can use as needed for shortness of breath and or wheezing.  Tessalon Perles and Promethazine DM cough syrup as needed for cough.   Final Clinical Impressions(s) / UC Diagnoses   Final diagnoses:  Hypokalemia  Hyponatremia  Shortness of breath     Discharge Instructions      Your chest x-ray did not demonstrate any evidence of pneumonia.  Your shortness of breath and fatigue may very well be secondary to your recent COVID and influenza infections.  The shortness of breath can last for 6 weeks or longer following a COVID infection.  Use your albuterol inhaler with a spacer, 2 puffs every 4-6  hours, as needed for any shortness of breath or wheezing you may have.  Use the Tessalon Perles every 8 hours during the day as needed for cough and use the Promethazine DM cough syrup at bedtime as needed.  The cough syrup will make you drowsy so be mindful of this if you have to get up in the middle night to use the bathroom.  Your potassium is also low, most likely secondary to one of your blood pressure medications, and this may be a cause  of your lightheadedness.  Your sodium is also mildly low which could also cause you to feel lightheaded.  Take the potassium chloride replacement, 20 mg twice daily, for 3 days.  You need to make an appointment for next week to follow-up with your primary care provider to have your potassium levels rechecked.  If you develop any chest pain, worsening shortness of breath, dizziness, or fainting you need to go to the ER for evaluation.     ED Prescriptions     Medication Sig Dispense Auth. Provider   potassium chloride SA (KLOR-CON M) 20 MEQ tablet Take 1 tablet (20 mEq total) by mouth 2 (two) times daily for 3 days. 6 tablet Becky Augusta, NP   albuterol (VENTOLIN HFA) 108 (90 Base) MCG/ACT inhaler Inhale 2 puffs into the lungs every 4 (four) hours as needed. 18 g Becky Augusta, NP   Spacer/Aero-Holding Chambers (AEROCHAMBER MV) inhaler Use as instructed 1 each Becky Augusta, NP   benzonatate (TESSALON) 100 MG capsule Take 2 capsules (200 mg total) by mouth every 8 (eight) hours. 21 capsule Becky Augusta, NP   promethazine-dextromethorphan (PROMETHAZINE-DM) 6.25-15 MG/5ML syrup Take 5 mLs by mouth 4 (four) times daily as needed. 118 mL Becky Augusta, NP      PDMP not reviewed this encounter.   Becky Augusta, NP 08/28/22 [phone]

## 2022-09-02 NOTE — Progress Notes (Unsigned)
PCP: Luciana Axe, NP   No chief complaint on file.   HPI:      [name] is a 62 y.o. [phone] whose LMP was No LMP recorded. Patient has had a hysterectomy., presents today for her annual examination.  Her menses are {norm/abn:715}, lasting {number: 22536} days.  Dysmenorrhea {dysmen:716}. She {does:18564} have intermenstrual bleeding. LSH BSO 3/22 for fibroids with Dr. Tiburcio Pea She {does:18564} have vasomotor sx.   Sex activity: {sex active: 315163}. She {does:18564} have vaginal dryness.  Last Pap: 06/02/18 Results were: no abnormalities /neg HPV DNA.  Hx of STDs: {STD hx:14358}  Last mammogram: 06/01/22 Results were: normal--routine follow-up in 12 months There is no FH of breast cancer. There is no FH of ovarian cancer. The patient {does:18564} do self-breast exams.  Colonoscopy: 5/24 with polyps; Repeat due after 10*** years.   Tobacco use: {tob:20664} Alcohol use: {Alcohol:11675} No drug use Exercise: {exercise:31265}  She {does:18564} get adequate calcium and Vitamin D in her diet.  Labs with PCP.   Patient Active Problem List   Diagnosis Date Noted   Varicose veins of leg with pain, right 03/27/2022   Hypothyroidism due to Hashimoto's thyroiditis 06/12/2020   IGT (impaired glucose tolerance) 06/12/2020   Irritant contact dermatitis 06/12/2020   S/P laparoscopic supracervical hysterectomy 05/27/2020   Fibroid uterus 05/14/2020   Rotator cuff tendinitis, right 11/06/2019   Traumatic complete tear of right rotator cuff 11/06/2019   COVID-19 virus infection 03/14/2019   S/P breast biopsy, right 01/12/2019   Breast pain, right 01/12/2019   Family history of breast cancer 06/02/2018   Family history of ovarian cancer 06/02/2018   Eczema 06/10/2017   History of uterine fibroid 10/23/2016   Metabolic syndrome 10/13/2016   Medication monitoring encounter 10/13/2016   Pressure in chest 12/17/2015   Anxiety disorder 12/17/2015   Obesity 12/17/2015    Sleep apnea 08/29/2015   Snoring 08/27/2015   Vitamin D deficiency 08/19/2015   Leg cramps 08/05/2015   IFG (impaired fasting glucose) 08/05/2015   Anal fistula    Other specified symptoms and signs involving the digestive system and abdomen 10/31/2014   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   Bladder neoplasm of uncertain malignant potential 06/09/2013   Female genuine stress incontinence 06/05/2013   Incomplete bladder emptying 06/05/2013   Urge incontinence 06/05/2013   Dysuria 06/05/2013   Other chronic cystitis without hematuria 06/05/2013   Retention of urine 06/05/2013   RLQ abdominal pain 07/27/2012   Palpitations 09/11/2010   Hypothyroidism, adult 05/10/2009   Hyperlipidemia LDL goal <130 05/10/2009   Hypertension goal BP (blood pressure) < 140/90 05/10/2009    Past Surgical History:  Procedure Laterality Date   ANAL FISTULOTOMY N/A 11/30/2014   Procedure: ANAL FISTULOTOMY;  Surgeon: Tiney Rouge III, MD;  Location: [facility] ORS;  Service: General;  Laterality: N/A;   BREAST BIOPSY Right 09/08/2018   affirm bx of mass, coil clip, ORGANIZING FAT NECROSIS AND ADJACENT CHANGES SUGGESTIVE OF RUPTURED CYST   BREAST BIOPSY Right 01/13/2021   PREDOMINANTLY BENIGN FIBROADIPOSE TISSUE SCANT FRAGMENTS OF UNREMARKABLE LYMPH NODE   CESAREAN SECTION     COLONOSCOPY  2014   COLONOSCOPY WITH PROPOFOL N/A 08/03/2022   Procedure: COLONOSCOPY WITH PROPOFOL;  Surgeon: Jaynie Collins, DO;  Location: [facility] ENDOSCOPY;  Service: Gastroenterology;  Laterality: N/A;   CYSTOSCOPY N/A 05/14/2020   Procedure: CYSTOSCOPY;  Surgeon: Nadara Mustard, MD;  Location: [facility] ORS;  Service: Gynecology;  Laterality: N/A;   DILATION AND CURETTAGE OF  UTERUS     ENDOMETRIAL ABLATION W/ NOVASURE  05/29/2009   Dr Tiburcio Pea   left foot surgery     RECTAL EXAM UNDER ANESTHESIA N/A 11/30/2014   Procedure: RECTAL EXAM UNDER ANESTHESIA;  Surgeon: Tiney Rouge III, MD;  Location: [facility] ORS;  Service:  General;  Laterality: N/A;   TOTAL LAPAROSCOPIC HYSTERECTOMY WITH BILATERAL SALPINGO OOPHORECTOMY Bilateral 05/14/2020   LAPAROSCOPIC SUPRACERVICAL HYSTERECTOMY WITH BILATERAL SALPINGO OOPHORECTOMY;  Surgeon: Nadara Mustard, MD;  Location: [facility] ORS;  Service: Gynecology;  Laterality: Bilateral;    Family History  Problem Relation Age of Onset   Hypertension Mother    Heart disease Mother 57       "blockages"   Alzheimer's disease Mother    Diverticulitis Father    Hernia Father    Depression Father    Alzheimer's disease Father    Hypothyroidism Sister    Uterine cancer Sister 70   Ovarian cancer Sister    Breast cancer Maternal Aunt 43   Hypothyroidism Daughter    Hypertension Brother    Stroke Maternal Grandfather    Heart attack Maternal Grandfather    Stroke Paternal Grandmother    Hypothyroidism Sister    Ovarian cancer Sister    Hypothyroidism Daughter    Breast cancer Paternal Aunt 39   Breast cancer Other 30    Social History   Socioeconomic History   Marital status: Married    Spouse name: Molly Maduro A. [place]   Number of children: 3   Years of education: Not on file   Highest education level: Associate degree: occupational, Scientist, product/process development, or vocational program  Occupational History   Occupation: Programmer, systems Pediatrics  Tobacco Use   Smoking status: Never   Smokeless tobacco: Never  Vaping Use   Vaping Use: Never used  Substance and Sexual Activity   Alcohol use: No    Alcohol/week: 0.0 standard drinks of alcohol   Drug use: No   Sexual activity: Yes    Birth control/protection: Post-menopausal  Other Topics Concern   Not on file  Social History Narrative   Not on file   Social Determinants of Health   Financial Resource Strain: Low Risk  (09/21/2017)   Overall Financial Resource Strain (CARDIA)    Difficulty of Paying Living Expenses: Not hard at all  Food Insecurity: No Food Insecurity (09/21/2017)   Hunger Vital Sign    Worried About Running Out  of Food in the Last Year: Never true    Ran Out of Food in the Last Year: Never true  Transportation Needs: No Transportation Needs (09/21/2017)   PRAPARE - Administrator, Civil Service (Medical): No    Lack of Transportation (Non-Medical): No  Physical Activity: Inactive (09/21/2017)   Exercise Vital Sign    Days of Exercise per Week: 0 days    Minutes of Exercise per Session: 0 min  Stress: No Stress Concern Present (09/21/2017)   [employer] of Occupational Health - Occupational Stress Questionnaire    Feeling of Stress : Not at all  Social Connections: Somewhat Isolated (09/21/2017)   Social Connection and Isolation Panel [NHANES]    Frequency of Communication with Friends and Family: More than three times a week    Frequency of Social Gatherings with Friends and Family: More than three times a week    Attends Religious Services: Never    Database administrator or Organizations: No    Attends Banker Meetings: Never    Marital Status: Married  Intimate Partner Violence: Not At Risk (09/21/2017)   Humiliation, Afraid, Rape, and Kick questionnaire    Fear of Current or Ex-Partner: No    Emotionally Abused: No    Physically Abused: No    Sexually Abused: No     Current Outpatient Medications:    albuterol (VENTOLIN HFA) 108 (90 Base) MCG/ACT inhaler, Inhale 2 puffs into the lungs every 4 (four) hours., Disp: , Rfl:    albuterol (VENTOLIN HFA) 108 (90 Base) MCG/ACT inhaler, Inhale 2 puffs into the lungs every 4 (four) hours as needed., Disp: 18 g, Rfl: 0   benzonatate (TESSALON) 100 MG capsule, Take 2 capsules (200 mg total) by mouth every 8 (eight) hours., Disp: 21 capsule, Rfl: 0   DUPIXENT 300 MG/2ML SOPN, Inject 300 mg into the skin every 14 (fourteen) days., Disp: , Rfl:    estradiol (ESTRACE) 0.5 MG tablet, Take by mouth., Disp: , Rfl:    levothyroxine (SYNTHROID) 112 MCG tablet, TAKE ONE TABLET DAILY. EVERY SUNDAY TAKE AN EXTRA ONE-HALF TABLET. (Patient  taking differently: Take 112-168 mcg by mouth See admin instructions. Take 1.5 tablets (168 mcg) by mouth on Sundays & take 1 tablet (112 mcg) by mouth on all other days.), Disp: 90 tablet, Rfl: 3   losartan-hydrochlorothiazide (HYZAAR) 100-12.5 MG tablet, Take 1 tablet by mouth daily., Disp: , Rfl:    naproxen (NAPROSYN) 500 MG tablet, Take 1 tablet (500 mg total) by mouth 2 (two) times daily., Disp: 20 tablet, Rfl: 0   Potassium Chloride 10 % SOLN, Take 20 mEq by mouth 2 (two) times daily for 3 days., Disp: 90 mL, Rfl: 0   promethazine-dextromethorphan (PROMETHAZINE-DM) 6.25-15 MG/5ML syrup, Take 5 mLs by mouth 4 (four) times daily as needed., Disp: 118 mL, Rfl: 0   Spacer/Aero-Holding Chambers (AEROCHAMBER MV) inhaler, Use as instructed, Disp: 1 each, Rfl: 2   spironolactone (ALDACTONE) 25 MG tablet, Take 1 tablet by mouth daily., Disp: , Rfl:    torsemide (DEMADEX) 5 MG tablet, Take by mouth., Disp: , Rfl:    vitamin B-12 (CYANOCOBALAMIN) 1000 MCG tablet, Take by mouth., Disp: , Rfl:   Current Facility-Administered Medications:    betamethasone acetate-betamethasone sodium phosphate (CELESTONE) injection 3 mg, 3 mg, Intra-articular, Once, Evans, Larena Glassman, DPM     ROS:  Review of Systems BREAST: No symptoms    Objective: There were no vitals taken for this visit.   OBGyn Exam  Results: No results found for this or any previous visit (from the past 24 hour(s)).  Assessment/Plan:  No diagnosis found.   No orders of the defined types were placed in this encounter.           GYN counsel {counseling: 16159}    F/U  No follow-ups on file.  Desmin Daleo B. Gavon Majano, PA-C 09/02/2022 9:28 PM

## 2022-09-03 ENCOUNTER — Encounter: Payer: Self-pay | Admitting: Obstetrics and Gynecology

## 2022-09-03 ENCOUNTER — Ambulatory Visit (INDEPENDENT_AMBULATORY_CARE_PROVIDER_SITE_OTHER): Payer: BC Managed Care – PPO | Admitting: Obstetrics and Gynecology

## 2022-09-03 ENCOUNTER — Other Ambulatory Visit (HOSPITAL_COMMUNITY)
Admission: RE | Admit: 2022-09-03 | Discharge: 2022-09-03 | Disposition: A | Discharge status: Home / Self Care | Payer: BC Managed Care – PPO | Source: Ambulatory Visit | Attending: Obstetrics and Gynecology | Admitting: Obstetrics and Gynecology

## 2022-09-03 VITALS — BP 130/90 | Ht 67.0 in | Wt 222.0 lb

## 2022-09-03 DIAGNOSIS — Z124 Encounter for screening for malignant neoplasm of cervix: Secondary | ICD-10-CM

## 2022-09-03 DIAGNOSIS — Z79890 Hormone replacement therapy (postmenopausal): Secondary | ICD-10-CM

## 2022-09-03 DIAGNOSIS — Z1151 Encounter for screening for human papillomavirus (HPV): Secondary | ICD-10-CM

## 2022-09-03 DIAGNOSIS — Z1231 Encounter for screening mammogram for malignant neoplasm of breast: Secondary | ICD-10-CM

## 2022-09-03 DIAGNOSIS — Z01419 Encounter for gynecological examination (general) (routine) without abnormal findings: Secondary | ICD-10-CM

## 2022-09-03 DIAGNOSIS — Z1211 Encounter for screening for malignant neoplasm of colon: Secondary | ICD-10-CM

## 2022-09-03 DIAGNOSIS — F419 Anxiety disorder, unspecified: Secondary | ICD-10-CM

## 2022-09-03 DIAGNOSIS — Z803 Family history of malignant neoplasm of breast: Secondary | ICD-10-CM

## 2022-09-03 MED ORDER — HYDROXYZINE HCL 25 MG PO TABS
25.0000 mg | ORAL_TABLET | Freq: Four times a day (QID) | ORAL | 0 refills | Status: DC | PRN
Start: 2022-09-03 — End: 2022-11-04

## 2022-09-03 NOTE — Patient Instructions (Signed)
I value your feedback and you entrusting us with your care. If you get a [facility] patient survey, I would appreciate you taking the time to let us know about your experience today. Thank you! ? ? ?

## 2022-09-04 LAB — CYTOLOGY - PAP
Comment: NEGATIVE
Diagnosis: NEGATIVE
High risk HPV: NEGATIVE

## 2022-09-10 ENCOUNTER — Encounter: Payer: BC Managed Care – PPO | Attending: Gerontology | Admitting: Dietician

## 2022-09-10 ENCOUNTER — Encounter: Payer: Self-pay | Admitting: Dietician

## 2022-09-10 VITALS — Wt 223.4 lb

## 2022-09-10 DIAGNOSIS — E785 Hyperlipidemia, unspecified: Secondary | ICD-10-CM

## 2022-09-10 DIAGNOSIS — I10 Essential (primary) hypertension: Secondary | ICD-10-CM

## 2022-09-10 DIAGNOSIS — E039 Hypothyroidism, unspecified: Secondary | ICD-10-CM | POA: Diagnosis not present

## 2022-09-10 DIAGNOSIS — Z6834 Body mass index (BMI) 34.0-34.9, adult: Secondary | ICD-10-CM

## 2022-09-10 DIAGNOSIS — E6609 Other obesity due to excess calories: Secondary | ICD-10-CM | POA: Diagnosis not present

## 2022-09-10 NOTE — Progress Notes (Signed)
Medical Nutrition Therapy: Visit start time: 1110  end time: 1140  Assessment:  Diagnosis: obesity Medical history changes: recent COVID, flu, URI Psychosocial issues/ stress concerns: none  Medications, supplement changes: reconciled list  in medical record   Current weight: 223.4lbs Height: 5'7" BMI: 34.99  Progress and evaluation:  Patient reports illness throughout most of the previous month due to COVID, flu at the same time, then another URI.  She returned to work about 10 days ago. Still has some fatigue but improving.  Has not been able to make dietary changes yet mostly due to illnesses.    Dietary Intake:  Usual eating pattern includes 3 meals and 2-3 snacks per day. Dining out frequency: 4 meals per week.  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 close to midnight Beverages: sodas recently due to illnesses, water  Physical activity: no regular activity, some on the job activity  Intervention:   Nutrition Care Education:  Basic nutrition: reviewed appropriate nutrient balance; appropriate meal and snack schedule; general nutrition guidelines    Weight control: reviewed progress since previous visit; controlling starchy and sugary foods, processed carbs and including lean protein sources, veg/ fruits with meals and snacks; reviewed goals from previous visit   Other Intervention Notes: Patient will work on keeping healthier food options at home, and plan time to prepare and use the healthy foods.  Updated goals with input from patient She did not wish to schedule another follow up at this time, will schedule later if needed.   Nutritional Diagnosis:   [place]-2.1 Inpaired nutrition utilization and [place]-2.2 Altered nutrition-related laboratory As related to  hypertension, hyperlipidemia, prediabetes.  As evidenced by patient with BP and blood lipids controlled with medications, elevated HbA1C. [place]-3.3 Overweight/obesity As related to hypothyroidsim, excess calories from frequent restaurant meals and sugar sweetened beverages.  As evidenced by patient with current BMI of 34.   Education Materials given:  Visit summary with Goals/ instructions   Learner/ who was taught:  Patient   Level of understanding: Verbalizes/ demonstrates competency  Demonstrated degree of understanding via:   Teach back Learning barriers: None   Willingness to learn/ readiness for change: Acceptance, ready for change   Monitoring and Evaluation:  Dietary intake, exercise, and body weight      follow up: prn

## 2022-09-10 NOTE — Patient Instructions (Signed)
Try light meal after work, maybe [place] yogurt with fruit; 1-2 scrambled or boiled eggs and 1pc bread and/or fruit.  Decrease processed starches and choose some whole grain foods.  Continue working on goals set at previous visit.

## 2022-09-14 ENCOUNTER — Encounter: Payer: Self-pay | Admitting: Obstetrics and Gynecology

## 2022-09-14 ENCOUNTER — Telehealth (INDEPENDENT_AMBULATORY_CARE_PROVIDER_SITE_OTHER): Payer: Self-pay | Admitting: Vascular Surgery

## 2022-09-14 NOTE — Telephone Encounter (Signed)
LVM for pt TCB and schedule appt  right leg GSV laser. see jd. no PA required. ref# [id number]   Plus one week [place]  Plus 4 week f/u with FB

## 2022-09-16 ENCOUNTER — Encounter: Payer: Self-pay | Admitting: Obstetrics and Gynecology

## 2022-09-16 MED ORDER — FLUCONAZOLE 150 MG PO TABS: 150.0000 mg | ORAL_TABLET | Freq: Once | ORAL | 0 refills | Status: AC

## 2022-09-18 NOTE — Telephone Encounter (Signed)
This encounter was created in error - please disregard.

## 2022-09-23 ENCOUNTER — Telehealth (INDEPENDENT_AMBULATORY_CARE_PROVIDER_SITE_OTHER): Payer: Self-pay | Admitting: Vascular Surgery

## 2022-09-23 NOTE — Telephone Encounter (Signed)
LVM for pt TCB and schedule appt   right leg GSV laser. see jd. no PA required. ref# [id number]    1 week post left GSV laser  4 week post left GSV laser with FB

## 2022-10-05 ENCOUNTER — Other Ambulatory Visit (INDEPENDENT_AMBULATORY_CARE_PROVIDER_SITE_OTHER): Payer: Self-pay | Admitting: Vascular Surgery

## 2022-10-05 DIAGNOSIS — I83811 Varicose veins of right lower extremities with pain: Secondary | ICD-10-CM

## 2022-10-06 ENCOUNTER — Ambulatory Visit (INDEPENDENT_AMBULATORY_CARE_PROVIDER_SITE_OTHER): Payer: BC Managed Care – PPO

## 2022-10-06 DIAGNOSIS — I83811 Varicose veins of right lower extremities with pain: Secondary | ICD-10-CM

## 2022-10-09 ENCOUNTER — Ambulatory Visit (INDEPENDENT_AMBULATORY_CARE_PROVIDER_SITE_OTHER): Payer: BC Managed Care – PPO | Admitting: Vascular Surgery

## 2022-10-12 ENCOUNTER — Telehealth (INDEPENDENT_AMBULATORY_CARE_PROVIDER_SITE_OTHER): Payer: Self-pay

## 2022-10-12 NOTE — Telephone Encounter (Signed)
There are no limitations, she should continue to wear compression socks as previously advised and will discuss her results at her visit

## 2022-10-12 NOTE — Telephone Encounter (Signed)
Left a detailed message on patient voicemail with medical recommendations from note below

## 2022-10-12 NOTE — Telephone Encounter (Signed)
Patient left a message asking were there any limitations she should follow based on the ultrasound results from 10/06/22. Patient do have a appointment schedule 10/20/22. Please Advise

## 2022-10-14 ENCOUNTER — Other Ambulatory Visit (INDEPENDENT_AMBULATORY_CARE_PROVIDER_SITE_OTHER): Payer: Self-pay

## 2022-10-14 ENCOUNTER — Telehealth (INDEPENDENT_AMBULATORY_CARE_PROVIDER_SITE_OTHER): Payer: Self-pay

## 2022-10-14 NOTE — Telephone Encounter (Signed)
Tried to call pt to schedule laser appt had to LVM

## 2022-10-14 NOTE — Telephone Encounter (Signed)
She would have to pick up the Rx or we could send to her.  We do not send electronic compression sock Rx

## 2022-10-15 NOTE — Telephone Encounter (Signed)
Pt made aware and states understanding. 

## 2022-10-15 NOTE — Telephone Encounter (Signed)
She just states that she needs some compression socks that are "fitted for her", the ones she has are not medical grade.  She wants to get these before her laser procedure.

## 2022-10-15 NOTE — Telephone Encounter (Signed)
Again, we can give her the Rx but the fitting will actually get done at [facility].  She actually doesn't need an Rx to get those. Once she goes, they will measure her.

## 2022-10-16 MED ORDER — ALPRAZOLAM 0.5 MG PO TABS: ORAL_TABLET | ORAL | 0 refills | Status: DC

## 2022-10-20 ENCOUNTER — Ambulatory Visit (INDEPENDENT_AMBULATORY_CARE_PROVIDER_SITE_OTHER): Payer: BC Managed Care – PPO | Admitting: Vascular Surgery

## 2022-10-27 ENCOUNTER — Ambulatory Visit (INDEPENDENT_AMBULATORY_CARE_PROVIDER_SITE_OTHER): Payer: BC Managed Care – PPO | Admitting: Vascular Surgery

## 2022-11-04 ENCOUNTER — Ambulatory Visit: Payer: BC Managed Care – PPO | Admitting: Surgery

## 2022-11-04 ENCOUNTER — Encounter: Payer: Self-pay | Admitting: Surgery

## 2022-11-04 VITALS — BP 130/77 | HR 92 | Temp 98.0°F | Ht 67.0 in | Wt 216.0 lb

## 2022-11-04 DIAGNOSIS — M79621 Pain in right upper arm: Secondary | ICD-10-CM | POA: Diagnosis not present

## 2022-11-04 DIAGNOSIS — N644 Mastodynia: Secondary | ICD-10-CM | POA: Diagnosis not present

## 2022-11-04 NOTE — Progress Notes (Signed)
11/04/2022  History of Present Illness: Desiree Walker is a 62 y.o. female presenting for follow-up of right breast and right axillary pain.  The patient was last seen on 10//23.  She had had a CT scan of the chest at that point which did not show any mass in the right axilla.  She saw orthopedic surgery and she reports having an MRI of the right shoulder which showed mostly scar tissue without any other abnormality.  The patient reports that she is still having some pain in the right axilla and is wondering if there is any potential that this could still be breast related.  Reports that the pain is in the right axilla mostly and some in the right lateral upper breast.  She has gone through physical therapy which has helped but reports still having pain in the area.  Denies any drainage from the nipple, any skin changes, or palpable masses.  Past Medical History: Past Medical History:  Diagnosis Date   Anemia    H/O   Anxiety    Arthritis    BRCA negative 08/2018   MyRisk neg except NTHL1 VUS   Complication of anesthesia    Family history of breast cancer 08/2018   IBIS=8.65/riskscore=7.9%   Family history of ovarian cancer    MyRisk neg except NTHL1 VUS   GERD (gastroesophageal reflux disease)    NO MEDS   Hyperlipidemia    Hypertension    Hypothyroidism    Hashimoto's per patient   IFG (impaired fasting glucose) 08/05/2015   Metabolic syndrome 10/13/2016   Palpitations 2012   Evaluated by Julien Nordmann, MD, Holter moniter   PONV (postoperative nausea and vomiting)    Pre-diabetes    Rectal mass    Thyroid disease    unspecified hypothyroidism     Past Surgical History: Past Surgical History:  Procedure Laterality Date   ANAL FISTULOTOMY N/A 11/30/2014   Procedure: ANAL FISTULOTOMY;  Surgeon: Tiney Rouge III, MD;  Location: [facility] ORS;  Service: General;  Laterality: N/A;   BREAST BIOPSY Right 09/08/2018   affirm bx of mass, coil clip, ORGANIZING FAT NECROSIS AND ADJACENT  CHANGES SUGGESTIVE OF RUPTURED CYST   BREAST BIOPSY Right 01/13/2021   PREDOMINANTLY BENIGN FIBROADIPOSE TISSUE SCANT FRAGMENTS OF UNREMARKABLE LYMPH NODE   CESAREAN SECTION     COLONOSCOPY  2014   COLONOSCOPY WITH PROPOFOL N/A 08/03/2022   Procedure: COLONOSCOPY WITH PROPOFOL;  Surgeon: Jaynie Collins, DO;  Location: [facility] ENDOSCOPY;  Service: Gastroenterology;  Laterality: N/A;   CYSTOSCOPY N/A 05/14/2020   Procedure: CYSTOSCOPY;  Surgeon: Nadara Mustard, MD;  Location: [facility] ORS;  Service: Gynecology;  Laterality: N/A;   DILATION AND CURETTAGE OF UTERUS     ENDOMETRIAL ABLATION W/ NOVASURE  05/29/2009   Dr Tiburcio Pea   left foot surgery     RECTAL EXAM UNDER ANESTHESIA N/A 11/30/2014   Procedure: RECTAL EXAM UNDER ANESTHESIA;  Surgeon: Tiney Rouge III, MD;  Location: [facility] ORS;  Service: General;  Laterality: N/A;   TOTAL LAPAROSCOPIC HYSTERECTOMY WITH BILATERAL SALPINGO OOPHORECTOMY Bilateral 05/14/2020   LAPAROSCOPIC SUPRACERVICAL HYSTERECTOMY WITH BILATERAL SALPINGO OOPHORECTOMY;  Surgeon: Nadara Mustard, MD;  Location: [facility] ORS;  Service: Gynecology;  Laterality: Bilateral;    Home Medications: Prior to Admission medications   Medication Sig Start Date End Date Taking? Authorizing Provider  BD INTEGRA SYRINGE 25G X 1" 3 ML MISC USE 1 SYRINGE MONTHLY   Yes [provider]  Cholecalciferol 125 MCG (5000 UT) capsule Take by  mouth. 06/18/22 06/18/23 Yes [provider]  cyanocobalamin (VITAMIN B12) 1000 MCG/ML injection Inject into the muscle. 06/16/22  Yes [provider]  DUPIXENT 300 MG/2ML prefilled syringe Inject into the skin.   Yes [provider]  levothyroxine (SYNTHROID) 112 MCG tablet TAKE ONE TABLET DAILY. EVERY SUNDAY TAKE AN EXTRA ONE-HALF TABLET. Patient taking differently: Take 112-168 mcg by mouth See admin instructions. Take 1.5 tablets (168 mcg) by mouth on Sundays & take 1 tablet (112 mcg) by mouth on all other days. 01/18/19  Yes Doren Custard, FNP  losartan-hydrochlorothiazide (HYZAAR) 100-12.5 MG tablet Take 1 tablet by mouth daily. 11/03/21  Yes [provider]  spironolactone (ALDACTONE) 25 MG tablet Take 1 tablet by mouth daily. 06/16/22  Yes [provider]  tacrolimus (PROTOPIC) 0.1 % ointment SMARTSIG:1 Topical Daily   Yes [provider]    Allergies: Allergies  Allergen Reactions   Latex Rash   Amlodipine     Other reaction(s): Other (See Comments) Ankle swelling   Oxycodone Nausea And Vomiting    No problems with Hydrocodone   Rosuvastatin Nausea Only   Scopolamine Other (See Comments)    "FELT LIKE I COULDN'T BREATHE"    Review of Systems: Review of Systems  Constitutional:  Negative for chills and fever.  Respiratory:  Negative for shortness of breath.   Cardiovascular:  Negative for chest pain.  Gastrointestinal:  Negative for abdominal pain, nausea and vomiting.  Skin:  Negative for rash.    Physical Exam BP 130/77   Pulse 92   Temp 98 F (36.7 C)   Ht 5\' 7"  (1.702 m)   Wt 216 lb (98 kg)   SpO2 97%   BMI 33.83 kg/m  CONSTITUTIONAL: No acute distress HEENT:  Normocephalic, atraumatic, extraocular motion intact. RESPIRATORY:  Normal respiratory effort without pathologic use of accessory muscles. CARDIOVASCULAR: Regular rhythm and rate. BREAST: Right breast without any palpable masses, skin changes, or nipple changes.  The patient does have some tenderness in the upper outer quadrant near the pectoralis muscle which is stable in location.  Prior biopsy site is well-healed.  In the right axilla, the patient has discomfort particularly at the posterior axillary line and mid axillary line but no palpable masses in either area. NEUROLOGIC:  Motor and sensation is grossly normal.  Cranial nerves are grossly intact. PSYCH:  Alert and oriented to person, place and time. Affect is normal.  Labs/Imaging: CT chest on 12/12/2021: IMPRESSION: 1. No chest wall mass or  explanation for axillary pain. 2. No acute intrathoracic findings. 3. Small hiatal hernia.  Screening mammogram on 06/01/2022: FINDINGS: There are no findings suspicious for malignancy.   IMPRESSION: No mammographic evidence of malignancy. A result letter of this screening mammogram will be mailed directly to the patient.   RECOMMENDATION: Screening mammogram in one year. (Code:SM-B-01Y)   BI-RADS CATEGORY  1: Negative.  Assessment and Plan: This is a 62 y.o. female with chronic right axillary and right breast pain.  - Discussed with the patient the findings on her previous imaging studies.  On exam, there were no palpable masses.  The area of pain both in the axilla and the breast could be related to scar tissue from her right shoulder surgery in the past.  As a precaution, will reorder diagnostic mammogram of the right breast and right axillary ultrasound to further evaluate for any potential reasons for her pain.  Discussed with patient that I will call her with the results.  If there  is any need for biopsies we can also order that as well and depending on any findings, we will set up a follow-up to discuss further if needed. - I will contact the patient with the results of her mammogram and ultrasound. - All of her questions have been answered.  I spent 30 minutes dedicated to the care of this patient on the date of this encounter to include pre-visit review of records, face-to-face time with the patient discussing diagnosis and management, and any post-visit coordination of care.   Howie Ill, MD [facility]

## 2022-11-04 NOTE — Patient Instructions (Addendum)
We have ordered you a right breast mammogram and right ultrasound of the breast and axilla. You may call [facility] to schedule these. [phone].  We will call you with your results and any next steps.    Breast Tenderness Breast tenderness is a common problem for women of all ages, but may also occur in men. Breast tenderness has many possible causes, including hormone changes, infections, taking certain medicines, and caffeine intake. In women, the pain usually comes and goes with the menstrual cycle, but it can also be constant. Breast tenderness may range from mild discomfort to severe pain. You may have tests, such as a mammogram or an ultrasound, to check for any unusual findings. Having breast tenderness usually does not mean that you have breast cancer. Follow these instructions at home: Managing pain and discomfort  If directed, put ice on the painful area. To do this: Put ice in a plastic bag. Place a towel between your skin and the bag. Leave the ice on for 20 minutes, 2-3 times a day. If your skin turns bright red, remove the ice right away to prevent skin damage. The risk of skin damage is higher if you cannot feel pain, heat, or cold. Wear a supportive bra or chest support: During exercise. While sleeping, if your breasts are very tender. You may also try pain relief patches. Medicines Take over-the-counter and prescription medicines only as told by your health care provider. If the cause of your pain is an infection, you may be prescribed an antibiotic medicine. If you were prescribed antibiotics, take them as told by your health care provider. Do not stop using the antibiotic even if you start to feel better. Eating and drinking Decrease the amount of caffeine in your diet. Instead, drink more water and choose caffeine-free drinks. Your health care provider may recommend that you lessen the amount of fat in your diet. You can do this by: Limiting fried  foods. Cooking foods using methods such as baking, boiling, grilling, and broiling. General instructions  Keep a log of the days and times when your breasts are most tender. Ask your health care provider how to do breast exams at home. This will help you notice if you have an unusual growth or lump. Keep all follow-up visits. Contact a health care provider if: Any part of your breast is hard, red, and hot to the touch. This may be a sign of infection. You are a woman and have a new or painful lump in your breast that remains after your menstrual period ends. You are not breastfeeding and you have fluid, especially blood or pus, coming out of your nipples. You have a fever. Your pain does not improve or it gets worse. Your pain is interfering with your daily activities. Summary Breast tenderness may range from mild discomfort to severe pain. Breast tenderness has many possible causes, including hormone changes, infections, taking certain medicines, and caffeine intake. It can be treated with ice, wearing a supportive bra or chest support, and medicines. Make changes to your diet as told by your health care provider. This information is not intended to replace advice given to you by your health care provider. Make sure you discuss any questions you have with your health care provider. Document Revised: 05/14/2021 Document Reviewed: 05/14/2021 Elsevier Patient Education  2024 [employer].

## 2022-11-05 ENCOUNTER — Ambulatory Visit
Admission: RE | Admit: 2022-11-05 | Discharge: 2022-11-05 | Disposition: A | Discharge status: Home / Self Care | Payer: BC Managed Care – PPO | Source: Ambulatory Visit | Attending: Surgery | Admitting: Surgery

## 2022-11-05 DIAGNOSIS — N644 Mastodynia: Secondary | ICD-10-CM | POA: Insufficient documentation

## 2022-11-05 DIAGNOSIS — M79621 Pain in right upper arm: Secondary | ICD-10-CM | POA: Insufficient documentation

## 2022-11-06 ENCOUNTER — Other Ambulatory Visit: Payer: Self-pay

## 2022-11-06 DIAGNOSIS — M79621 Pain in right upper arm: Secondary | ICD-10-CM

## 2022-11-13 ENCOUNTER — Encounter (INDEPENDENT_AMBULATORY_CARE_PROVIDER_SITE_OTHER): Payer: Self-pay | Admitting: Vascular Surgery

## 2022-11-13 ENCOUNTER — Other Ambulatory Visit: Payer: BC Managed Care – PPO

## 2022-11-13 ENCOUNTER — Ambulatory Visit (INDEPENDENT_AMBULATORY_CARE_PROVIDER_SITE_OTHER): Payer: BC Managed Care – PPO | Admitting: Vascular Surgery

## 2022-11-13 VITALS — BP 148/85 | HR 98 | Resp 16 | Wt 216.0 lb

## 2022-11-13 DIAGNOSIS — I10 Essential (primary) hypertension: Secondary | ICD-10-CM | POA: Diagnosis not present

## 2022-11-13 DIAGNOSIS — I83811 Varicose veins of right lower extremities with pain: Secondary | ICD-10-CM

## 2022-11-13 NOTE — Progress Notes (Signed)
MRN : [id number]  Desiree Walker is a 62 y.o. (1960/07/16) female who presents with chief complaint of  Chief Complaint  Patient presents with   Follow-up    Discuss laser procedure  .  History of Present Illness: Patient returns today in follow up of her venous insufficiency. She is having more pain and tenderness in the right lower leg.  She is still wearing his compression socks daily and elevating her legs.  She denies any open wounds of infection.  She has known right GSV reflux and is scheduled for right GSV EVLT in the near future. She has a lot of questions and concerns to discuss today about her disease and her laser ablation.   Current Outpatient Medications  Medication Sig Dispense Refill   BD INTEGRA SYRINGE 25G X 1" 3 ML MISC USE 1 SYRINGE MONTHLY     Cholecalciferol 125 MCG (5000 UT) capsule Take by mouth.     cyanocobalamin (VITAMIN B12) 1000 MCG/ML injection Inject into the muscle.     DUPIXENT 300 MG/2ML prefilled syringe Inject into the skin.     levothyroxine (SYNTHROID) 112 MCG tablet TAKE ONE TABLET DAILY. EVERY SUNDAY TAKE AN EXTRA ONE-HALF TABLET. (Patient taking differently: Take 112-168 mcg by mouth See admin instructions. Take 1.5 tablets (168 mcg) by mouth on Sundays & take 1 tablet (112 mcg) by mouth on all other days.) 90 tablet 3   losartan-hydrochlorothiazide (HYZAAR) 100-12.5 MG tablet Take 1 tablet by mouth daily.     spironolactone (ALDACTONE) 25 MG tablet Take 1 tablet by mouth daily.     tacrolimus (PROTOPIC) 0.1 % ointment SMARTSIG:1 Topical Daily     Current Facility-Administered Medications  Medication Dose Route Frequency Provider Last Rate Last Admin   betamethasone acetate-betamethasone sodium phosphate (CELESTONE) injection 3 mg  3 mg Intra-articular Once Felecia Shelling, DPM        Past Medical History:  Diagnosis Date   Anemia    H/O   Anxiety    Arthritis    BRCA negative 08/2018   MyRisk neg except NTHL1 VUS   Complication of  anesthesia    Family history of breast cancer 08/2018   IBIS=8.65/riskscore=7.9%   Family history of ovarian cancer    MyRisk neg except NTHL1 VUS   GERD (gastroesophageal reflux disease)    NO MEDS   Hyperlipidemia    Hypertension    Hypothyroidism    Hashimoto's per patient   IFG (impaired fasting glucose) 08/05/2015   Metabolic syndrome 10/13/2016   Palpitations 2012   Evaluated by Julien Nordmann, MD, Holter moniter   PONV (postoperative nausea and vomiting)    Pre-diabetes    Rectal mass    Thyroid disease    unspecified hypothyroidism    Past Surgical History:  Procedure Laterality Date   ANAL FISTULOTOMY N/A 11/30/2014   Procedure: ANAL FISTULOTOMY;  Surgeon: Tiney Rouge III, MD;  Location: [facility] ORS;  Service: General;  Laterality: N/A;   BREAST BIOPSY Right 09/08/2018   affirm bx of mass, coil clip, ORGANIZING FAT NECROSIS AND ADJACENT CHANGES SUGGESTIVE OF RUPTURED CYST   BREAST BIOPSY Right 01/13/2021   PREDOMINANTLY BENIGN FIBROADIPOSE TISSUE SCANT FRAGMENTS OF UNREMARKABLE LYMPH NODE   CESAREAN SECTION     COLONOSCOPY  2014   COLONOSCOPY WITH PROPOFOL N/A 08/03/2022   Procedure: COLONOSCOPY WITH PROPOFOL;  Surgeon: Jaynie Collins, DO;  Location: [facility];  Service: Gastroenterology;  Laterality: N/A;   CYSTOSCOPY N/A 05/14/2020   Procedure: CYSTOSCOPY;  Surgeon: Nadara Mustard, MD;  Location: [facility] ORS;  Service: Gynecology;  Laterality: N/A;   DILATION AND CURETTAGE OF UTERUS     ENDOMETRIAL ABLATION W/ NOVASURE  05/29/2009   Dr Tiburcio Pea   left foot surgery     RECTAL EXAM UNDER ANESTHESIA N/A 11/30/2014   Procedure: RECTAL EXAM UNDER ANESTHESIA;  Surgeon: Tiney Rouge III, MD;  Location: [facility] ORS;  Service: General;  Laterality: N/A;   TOTAL LAPAROSCOPIC HYSTERECTOMY WITH BILATERAL SALPINGO OOPHORECTOMY Bilateral 05/14/2020   LAPAROSCOPIC SUPRACERVICAL HYSTERECTOMY WITH BILATERAL SALPINGO OOPHORECTOMY;  Surgeon: Nadara Mustard, MD;  Location: [facility] ORS;   Service: Gynecology;  Laterality: Bilateral;     Social History   Tobacco Use   Smoking status: Never    Passive exposure: Never   Smokeless tobacco: Never  Vaping Use   Vaping status: Never Used  Substance Use Topics   Alcohol use: No    Alcohol/week: 0.0 standard drinks of alcohol   Drug use: No      Family History  Problem Relation Age of Onset   Hypertension Mother    Heart disease Mother 9       "blockages"   Alzheimer's disease Mother    Diverticulitis Father    Hernia Father    Depression Father    Alzheimer's disease Father    Hypothyroidism Sister    Uterine cancer Sister 64   Hypothyroidism Sister    Ovarian cancer Sister    Hypertension Brother    Stroke Maternal Grandfather    Heart attack Maternal Grandfather    Stroke Paternal Grandmother    Hypothyroidism Daughter    Hypothyroidism Daughter    Breast cancer Maternal Aunt 46   Breast cancer Paternal Aunt 67   Ovarian cancer Paternal Aunt    Breast cancer Other 30   Pancreatic cancer Maternal Uncle    Colon cancer Maternal Uncle      Allergies  Allergen Reactions   Latex Rash   Amlodipine     Other reaction(s): Other (See Comments) Ankle swelling   Oxycodone Nausea And Vomiting    No problems with Hydrocodone   Rosuvastatin Nausea Only   Scopolamine Other (See Comments)    "FELT LIKE I COULDN'T BREATHE"     REVIEW OF SYSTEMS (Negative unless checked)   Constitutional: [] Weight loss  [] Fever  [] Chills Cardiac: [] Chest pain   [] Chest pressure   [] Palpitations   [] Shortness of breath when laying flat   [] Shortness of breath at rest   [] Shortness of breath with exertion. Vascular:  [] Pain in legs with walking   [] Pain in legs at rest   [] Pain in legs when laying flat   [] Claudication   [] Pain in feet when walking  [] Pain in feet at rest  [] Pain in feet when laying flat   [] History of DVT   [] Phlebitis   [x] Swelling in legs   [x] Varicose veins   [] Non-healing ulcers Pulmonary:   [] Uses home  oxygen   [] Productive cough   [] Hemoptysis   [] Wheeze  [] COPD   [] Asthma Neurologic:  [] Dizziness  [] Blackouts   [] Seizures   [] History of stroke   [] History of TIA  [] Aphasia   [] Temporary blindness   [] Dysphagia   [] Weakness or numbness in arms   [] Weakness or numbness in legs Musculoskeletal:  [] Arthritis   [x] Joint swelling   [] Joint pain   [] Low back pain Hematologic:  [] Easy bruising  [] Easy bleeding   [] Hypercoagulable state   [] Anemic   Gastrointestinal:  [] Blood in stool   []   Vomiting blood  [] Gastroesophageal reflux/heartburn   [] Abdominal pain Genitourinary:  [] Chronic kidney disease   [] Difficult urination  [] Frequent urination  [] Burning with urination   [] Hematuria Skin:  [] Rashes   [] Ulcers   [] Wounds Psychological:  [x] History of anxiety   []  History of major depression.  Physical Examination  BP (!) 148/85 (BP Location: Left Arm)   Pulse 98   Resp 16   Wt 216 lb (98 kg)   BMI 33.83 kg/m  Gen:  WD/WN, NAD Head: [place]/AT, No temporalis wasting. Ear/Nose/Throat: Hearing grossly intact, nares w/o erythema or drainage Eyes: Conjunctiva clear. Sclera non-icteric Neck: Supple.  Trachea midline Pulmonary:  Good air movement, no use of accessory muscles.  Cardiac: RRR, no JVD Vascular:  Vessel Right Left  Radial Palpable Palpable                          PT Palpable Palpable  DP Palpable Palpable   Gastrointestinal: soft, non-tender/non-distended. No guarding/reflex.  Musculoskeletal: M/S 5/5 throughout.  No deformity or atrophy. Diffuse varicosities bilaterally, worse on the right. Trace RLE edema. Neurologic: Sensation grossly intact in extremities.  Symmetrical.  Speech is fluent.  Psychiatric: Judgment intact, Mood & affect appropriate for pt's clinical situation. Dermatologic: No rashes or ulcers noted.  No cellulitis or open wounds.      Labs Recent Results (from the past 2160 hour(s))  CBC with Differential     Status: Abnormal   Collection Time: 08/28/22  10:13 AM  Result Value Ref Range   WBC 6.7 4.0 - 10.5 K/uL   RBC 5.23 (H) 3.87 - 5.11 MIL/uL   Hemoglobin 14.0 12.0 - 15.0 g/dL   HCT 81.1 91.4 - 78.2 %   MCV 79.7 (L) 80.0 - 100.0 fL   MCH 26.8 26.0 - 34.0 pg   MCHC 33.6 30.0 - 36.0 g/dL   RDW 95.6 21.3 - 08.6 %   Platelets 244 150 - 400 K/uL   nRBC 0.0 0.0 - 0.2 %   Neutrophils Relative % 53 %   Neutro Abs 3.6 1.7 - 7.7 K/uL   Lymphocytes Relative 38 %   Lymphs Abs 2.5 0.7 - 4.0 K/uL   Monocytes Relative 6 %   Monocytes Absolute 0.4 0.1 - 1.0 K/uL   Eosinophils Relative 1 %   Eosinophils Absolute 0.1 0.0 - 0.5 K/uL   Basophils Relative 1 %   Basophils Absolute 0.0 0.0 - 0.1 K/uL   Immature Granulocytes 1 %   Abs Immature Granulocytes 0.06 0.00 - 0.07 K/uL    Comment: Performed at [facility], [address] [place], [place] [address]  Comprehensive metabolic panel     Status: Abnormal   Collection Time: 08/28/22 10:13 AM  Result Value Ref Range   Sodium 133 (L) 135 - 145 mmol/L   Potassium 3.0 (L) 3.5 - 5.1 mmol/L   Chloride 98 98 - 111 mmol/L   CO2 23 22 - 32 mmol/L   Glucose, Bld 134 (H) 70 - 99 mg/dL    Comment: Glucose reference range applies only to samples taken after fasting for at least 8 hours.   BUN 16 8 - 23 mg/dL   Creatinine, Ser 9.62 0.44 - 1.00 mg/dL   Calcium 8.6 (L) 8.9 - 10.3 mg/dL   Total Protein 7.1 6.5 - 8.1 g/dL   Albumin 3.9 3.5 - 5.0 g/dL   AST 22 15 - 41 U/L   ALT 16 0 - 44 U/L  Alkaline Phosphatase 68 38 - 126 U/L   Total Bilirubin 1.0 0.3 - 1.2 mg/dL   GFR, Estimated >32 >95 mL/min    Comment: (NOTE) Calculated using the CKD-EPI Creatinine Equation (2021)    Anion gap 12 5 - 15    Comment: Performed at [facility], [address] [place], [place] [address]  Troponin I (High Sensitivity)     Status: None   Collection Time: 08/28/22 10:13 AM  Result Value Ref Range   Troponin I (High Sensitivity) 3 <18 ng/L    Comment: (NOTE) Elevated high sensitivity  troponin I (hsTnI) values and significant  changes across serial measurements may suggest ACS but many other  chronic and acute conditions are known to elevate hsTnI results.  Refer to the "Links" section for chest pain algorithms and additional  guidance. Performed at [facility], [address], [place], [place] [address]   Cytology - PAP     Status: None   Collection Time: 09/03/22 10:46 AM  Result Value Ref Range   High risk HPV Negative    Adequacy Satisfactory for evaluation.    Diagnosis      - Negative for intraepithelial lesion or malignancy (NILM)   Microorganisms      Fungal organisms present consistent with Candida spp.   Comment Normal Reference Range HPV - Negative     Radiology MM 3D DIAGNOSTIC MAMMOGRAM UNILATERAL RIGHT BREAST  Result Date: 11/05/2022 CLINICAL DATA:  Patient describes diffuse pain in the RIGHT axilla and RIGHT breast since shoulder surgery. EXAM: DIGITAL DIAGNOSTIC UNILATERAL RIGHT MAMMOGRAM WITH TOMOSYNTHESIS AND CAD TECHNIQUE: Right digital diagnostic mammography and breast tomosynthesis was performed. The images were evaluated with computer-aided detection. COMPARISON:  Previous exam(s). ACR Breast Density Category a: The breasts are almost entirely fatty. FINDINGS: No suspicious mass, distortion, or microcalcifications are identified to suggest presence of malignancy. IMPRESSION: No mammographic evidence for malignancy. RECOMMENDATION: Screening mammogram is recommended in March 2025. I have discussed the findings and recommendations with the patient. If applicable, a reminder letter will be sent to the patient regarding the next appointment. BI-RADS CATEGORY  1: Negative. Electronically Signed   By: Norva Pavlov M.D.   On: 11/05/2022 11:27    Assessment/Plan  Hypertension goal BP (blood pressure) < 140/90 blood pressure control important in reducing the progression of atherosclerotic disease. On appropriate oral  medications.   Varicose veins of leg with pain, right The procedure was discussed in detail with the patient.  The risks and benefits as well as the expected outcomes.  All her questions were answered.  She is desirable to proceed with laser ablation of the right great saphenous vein.    Festus Barren, MD  11/13/2022 12:40 PM    This note was created with Dragon medical transcription system.  Any errors from dictation are purely unintentional

## 2022-11-13 NOTE — Assessment & Plan Note (Signed)
blood pressure control important in reducing the progression of atherosclerotic disease. On appropriate oral medications.  

## 2022-11-13 NOTE — Assessment & Plan Note (Signed)
The procedure was discussed in detail with the patient.  The risks and benefits as well as the expected outcomes.  All her questions were answered.  She is desirable to proceed with laser ablation of the right great saphenous vein.

## 2022-11-17 ENCOUNTER — Encounter (INDEPENDENT_AMBULATORY_CARE_PROVIDER_SITE_OTHER): Payer: Self-pay | Admitting: Vascular Surgery

## 2022-11-19 ENCOUNTER — Telehealth (INDEPENDENT_AMBULATORY_CARE_PROVIDER_SITE_OTHER): Payer: Self-pay | Admitting: Nurse Practitioner

## 2022-11-19 NOTE — Telephone Encounter (Signed)
At this time I don't feel that it would be in the patient's best interest to move forward with a laser tomorrow.  I believe that we should cancel at this time as she had an extensive conversation with Dr. Wyn Quaker on Friday, I have answered multiple questions and staff has answered multiple questions related to this as well.  I think it would be best if she were to come in to have an additional discussion with Dr. Wyn Quaker to fully flush out and answer all of her questions so that she has full information to hopefully make the best decision for her and she can have to time to think and reflect with out feeling pressured to do so because her laser is scheduled.  So please communicate to the patient that we will cancel and have her come in to discuss once again.

## 2022-11-19 NOTE — Telephone Encounter (Signed)
Pt calling in stating that she is having major anxiety regarding the laser tomorrow. She would like for someone to "talk her through" it again to make sure she is doing what she needs to do. She is asking for a call before tomorrow from Sheppard Plumber, NP to ask questions etc. Please advise.

## 2022-11-20 ENCOUNTER — Ambulatory Visit (INDEPENDENT_AMBULATORY_CARE_PROVIDER_SITE_OTHER): Payer: BC Managed Care – PPO | Admitting: Vascular Surgery

## 2022-11-20 DIAGNOSIS — I83811 Varicose veins of right lower extremities with pain: Secondary | ICD-10-CM | POA: Diagnosis not present

## 2022-11-20 NOTE — Progress Notes (Signed)
Desiree Walker is a 62 y.o. female who presents with symptomatic venous reflux  Past Medical History:  Diagnosis Date   Anemia    H/O   Anxiety    Arthritis    BRCA negative 08/2018   MyRisk neg except NTHL1 VUS   Complication of anesthesia    Family history of breast cancer 08/2018   IBIS=8.65/riskscore=7.9%   Family history of ovarian cancer    MyRisk neg except NTHL1 VUS   GERD (gastroesophageal reflux disease)    NO MEDS   Hyperlipidemia    Hypertension    Hypothyroidism    Hashimoto's per patient   IFG (impaired fasting glucose) 08/05/2015   Metabolic syndrome 10/13/2016   Palpitations 2012   Evaluated by Julien Nordmann, MD, Holter moniter   PONV (postoperative nausea and vomiting)    Pre-diabetes    Rectal mass    Thyroid disease    unspecified hypothyroidism    Past Surgical History:  Procedure Laterality Date   ANAL FISTULOTOMY N/A 11/30/2014   Procedure: ANAL FISTULOTOMY;  Surgeon: Tiney Rouge III, MD;  Location: [facility] ORS;  Service: General;  Laterality: N/A;   BREAST BIOPSY Right 09/08/2018   affirm bx of mass, coil clip, ORGANIZING FAT NECROSIS AND ADJACENT CHANGES SUGGESTIVE OF RUPTURED CYST   BREAST BIOPSY Right 01/13/2021   PREDOMINANTLY BENIGN FIBROADIPOSE TISSUE SCANT FRAGMENTS OF UNREMARKABLE LYMPH NODE   CESAREAN SECTION     COLONOSCOPY  2014   COLONOSCOPY WITH PROPOFOL N/A 08/03/2022   Procedure: COLONOSCOPY WITH PROPOFOL;  Surgeon: Jaynie Collins, DO;  Location: [facility] ENDOSCOPY;  Service: Gastroenterology;  Laterality: N/A;   CYSTOSCOPY N/A 05/14/2020   Procedure: CYSTOSCOPY;  Surgeon: Nadara Mustard, MD;  Location: [facility] ORS;  Service: Gynecology;  Laterality: N/A;   DILATION AND CURETTAGE OF UTERUS     ENDOMETRIAL ABLATION W/ NOVASURE  05/29/2009   Dr Tiburcio Pea   left foot surgery     RECTAL EXAM UNDER ANESTHESIA N/A 11/30/2014   Procedure: RECTAL EXAM UNDER ANESTHESIA;  Surgeon: Tiney Rouge III, MD;  Location: [facility] ORS;  Service: General;   Laterality: N/A;   TOTAL LAPAROSCOPIC HYSTERECTOMY WITH BILATERAL SALPINGO OOPHORECTOMY Bilateral 05/14/2020   LAPAROSCOPIC SUPRACERVICAL HYSTERECTOMY WITH BILATERAL SALPINGO OOPHORECTOMY;  Surgeon: Nadara Mustard, MD;  Location: [facility] ORS;  Service: Gynecology;  Laterality: Bilateral;     Current Outpatient Medications:    BD INTEGRA SYRINGE 25G X 1" 3 ML MISC, USE 1 SYRINGE MONTHLY, Disp: , Rfl:    Cholecalciferol 125 MCG (5000 UT) capsule, Take by mouth., Disp: , Rfl:    cyanocobalamin (VITAMIN B12) 1000 MCG/ML injection, Inject into the muscle., Disp: , Rfl:    DUPIXENT 300 MG/2ML prefilled syringe, Inject into the skin., Disp: , Rfl:    levothyroxine (SYNTHROID) 112 MCG tablet, TAKE ONE TABLET DAILY. EVERY SUNDAY TAKE AN EXTRA ONE-HALF TABLET. (Patient taking differently: Take 112-168 mcg by mouth See admin instructions. Take 1.5 tablets (168 mcg) by mouth on Sundays & take 1 tablet (112 mcg) by mouth on all other days.), Disp: 90 tablet, Rfl: 3   losartan-hydrochlorothiazide (HYZAAR) 100-12.5 MG tablet, Take 1 tablet by mouth daily., Disp: , Rfl:    spironolactone (ALDACTONE) 25 MG tablet, Take 1 tablet by mouth daily., Disp: , Rfl:    tacrolimus (PROTOPIC) 0.1 % ointment, SMARTSIG:1 Topical Daily, Disp: , Rfl:   Current Facility-Administered Medications:    betamethasone acetate-betamethasone sodium phosphate (CELESTONE) injection 3 mg, 3 mg, Intra-articular, Once, Logan Bores, Larena Glassman, DPM  Allergies  Allergen Reactions   Latex Rash   Amlodipine     Other reaction(s): Other (See Comments) Ankle swelling   Oxycodone Nausea And Vomiting    No problems with Hydrocodone   Rosuvastatin Nausea Only   Scopolamine Other (See Comments)    "FELT LIKE I COULDN'T BREATHE"     Varicose veins of leg with pain, right     PLAN: The patient's right lower extremity was sterilely prepped and draped. The ultrasound machine was used to visualize the saphenous vein throughout its course. A segment  at the level of the knee was selected for access. The saphenous vein was accessed without difficulty using ultrasound guidance with a micropuncture needle. A 0.018 wire was then placed beyond the saphenofemoral junction and the needle was removed. The 65 cm sheath was then placed over the wire and the wire and dilator were removed. The laser fiber was then placed through the sheath and its tip was placed approximately 4-5 centimeters below the saphenofemoral junction. Tumescent anesthesia was then created with a dilute lidocaine solution. Laser energy was then delivered with constant withdrawal of the sheath and laser fiber. Approximately 1044 joules of energy were delivered over a length of 28 centimeters using a 1470 Hz VenaCure machine at 7 W. Sterile dressings were placed. The patient tolerated the procedure well without obvious complications.   Follow-up in 1 week with post-laser duplex.

## 2022-11-24 ENCOUNTER — Other Ambulatory Visit (INDEPENDENT_AMBULATORY_CARE_PROVIDER_SITE_OTHER): Payer: Self-pay | Admitting: Vascular Surgery

## 2022-11-24 DIAGNOSIS — I83811 Varicose veins of right lower extremities with pain: Secondary | ICD-10-CM

## 2022-11-27 ENCOUNTER — Ambulatory Visit (INDEPENDENT_AMBULATORY_CARE_PROVIDER_SITE_OTHER): Payer: BC Managed Care – PPO

## 2022-11-27 DIAGNOSIS — I83811 Varicose veins of right lower extremities with pain: Secondary | ICD-10-CM | POA: Diagnosis not present

## 2022-11-30 ENCOUNTER — Other Ambulatory Visit: Payer: BC Managed Care – PPO

## 2022-12-08 ENCOUNTER — Other Ambulatory Visit: Payer: Self-pay | Admitting: Surgery

## 2022-12-08 ENCOUNTER — Ambulatory Visit
Admission: RE | Admit: 2022-12-08 | Discharge: 2022-12-08 | Disposition: A | Discharge status: Home / Self Care | Payer: BC Managed Care – PPO | Source: Ambulatory Visit | Attending: Surgery | Admitting: Surgery

## 2022-12-08 DIAGNOSIS — M79621 Pain in right upper arm: Secondary | ICD-10-CM | POA: Insufficient documentation

## 2022-12-14 ENCOUNTER — Encounter (INDEPENDENT_AMBULATORY_CARE_PROVIDER_SITE_OTHER): Payer: Self-pay | Admitting: Nurse Practitioner

## 2022-12-14 ENCOUNTER — Ambulatory Visit (INDEPENDENT_AMBULATORY_CARE_PROVIDER_SITE_OTHER): Payer: BC Managed Care – PPO | Admitting: Nurse Practitioner

## 2022-12-14 VITALS — BP 141/85 | HR 100 | Resp 16 | Wt 221.0 lb

## 2022-12-14 DIAGNOSIS — I83811 Varicose veins of right lower extremities with pain: Secondary | ICD-10-CM

## 2022-12-14 DIAGNOSIS — R200 Anesthesia of skin: Secondary | ICD-10-CM | POA: Diagnosis not present

## 2022-12-14 DIAGNOSIS — R202 Paresthesia of skin: Secondary | ICD-10-CM

## 2022-12-14 NOTE — Progress Notes (Signed)
Subjective:    Patient ID: Desiree Walker, female    DOB: 1960/05/08, 62 y.o.   MRN: [id number] Chief Complaint  Patient presents with   Follow-up    4 week post laser    The patient returns to the office for followup status post laser ablation of the right great saphenous vein on 11/20/2022. The patient notes multiple residual varicosities bilaterally which continued to hurt with dependent positions and remained tender to palpation. The patient's swelling is unchanged from preoperative status. The patient continues to wear graduated compression stockings on a daily basis but these are not eliminating the pain and discomfort. The patient continues to use over-the-counter anti-inflammatory medications to treat the pain and related symptoms but this has not given the patient relief. The patient notes the pain in the lower extremities is causing problems with daily exercise, problems at work and even with household activities such as preparing meals and doing dishes.  The patient is otherwise done well and there have been no complications related to the laser procedure or interval changes in the patient's overall   Venous ultrasound post laser shows successful laser ablation of the right great saphenous vein, no DVT identified.    Review of Systems  Neurological:  Positive for numbness.  All other systems reviewed and are negative.      Objective:   Physical Exam Vitals reviewed.  HENT:     Head: Normocephalic.  Cardiovascular:     Rate and Rhythm: Normal rate.     Pulses: Normal pulses.  Pulmonary:     Effort: Pulmonary effort is normal.  Musculoskeletal:        General: Tenderness present.  Skin:    General: Skin is warm and dry.     Capillary Refill: Capillary refill takes less than 2 seconds.  Neurological:     Mental Status: She is alert and oriented to person, place, and time.  Psychiatric:        Mood and Affect: Mood normal.        Behavior: Behavior normal.         Thought Content: Thought content normal.        Judgment: Judgment normal.     BP (!) 141/85 (BP Location: Left Arm)   Pulse 100   Resp 16   Wt 221 lb (100.2 kg)   BMI 34.61 kg/m   Past Medical History:  Diagnosis Date   Anemia    H/O   Anxiety    Arthritis    BRCA negative 08/2018   MyRisk neg except NTHL1 VUS   Complication of anesthesia    Family history of breast cancer 08/2018   IBIS=8.65/riskscore=7.9%   Family history of ovarian cancer    MyRisk neg except NTHL1 VUS   GERD (gastroesophageal reflux disease)    NO MEDS   Hyperlipidemia    Hypertension    Hypothyroidism    Hashimoto's per patient   IFG (impaired fasting glucose) 08/05/2015   Metabolic syndrome 10/13/2016   Palpitations 2012   Evaluated by Julien Nordmann, MD, Holter moniter   PONV (postoperative nausea and vomiting)    Pre-diabetes    Rectal mass    Thyroid disease    unspecified hypothyroidism    Social History   Socioeconomic History   Marital status: Married    Spouse name: Molly Maduro A. 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[employer]  Tobacco Use   Smoking status: Never    Passive exposure: Never   Smokeless tobacco: Never  Vaping Use   Vaping status: Never Used  Substance and Sexual Activity   Alcohol use: No    Alcohol/week: 0.0 standard drinks of alcohol   Drug use: No   Sexual activity: Yes    Birth control/protection: Post-menopausal  Other Topics Concern   Not on file  Social History Narrative   Not on file   Social Determinants of Health   Financial Resource Strain: Low Risk  (09/21/2017)   Overall Financial Resource Strain (CARDIA)    Difficulty of Paying Living Expenses: Not hard at all  Food Insecurity: No Food Insecurity (09/21/2017)   Hunger Vital Sign    Worried About Running Out of Food in the  Last Year: Never true    Ran Out of Food in the Last Year: Never true  Transportation Needs: No Transportation Needs (09/21/2017)   PRAPARE - Administrator, Civil Service (Medical): No    Lack of Transportation (Non-Medical): No  Physical Activity: Inactive (09/21/2017)   Exercise Vital Sign    Days of Exercise per Week: 0 days    Minutes of Exercise per Session: 0 min  Stress: No Stress Concern Present (09/21/2017)   [employer] of Occupational Health - Occupational Stress Questionnaire    Feeling of Stress : Not at all  Social Connections: Somewhat Isolated (09/21/2017)   Social Connection and Isolation Panel [NHANES]    Frequency of Communication with Friends and Family: More than three times a week    Frequency of Social Gatherings with Friends and Family: More than three times a week    Attends Religious Services: Never    Database administrator or Organizations: No    Attends Banker Meetings: Never    Marital Status: Married  Catering manager Violence: Not At Risk (09/21/2017)   Humiliation, Afraid, Rape, and Kick questionnaire    Fear of Current or Ex-Partner: No    Emotionally Abused: No    Physically Abused: No    Sexually Abused: No    Past Surgical History:  Procedure Laterality Date   ANAL FISTULOTOMY N/A 11/30/2014   Procedure: ANAL FISTULOTOMY;  Surgeon: Tiney Rouge III, MD;  Location: [facility];  Service: General;  Laterality: N/A;   BREAST BIOPSY Right 09/08/2018   affirm bx of mass, coil clip, ORGANIZING FAT NECROSIS AND ADJACENT CHANGES SUGGESTIVE OF RUPTURED CYST   BREAST BIOPSY Right 01/13/2021   PREDOMINANTLY BENIGN FIBROADIPOSE TISSUE SCANT FRAGMENTS OF UNREMARKABLE LYMPH NODE   CESAREAN SECTION     COLONOSCOPY  2014   COLONOSCOPY WITH PROPOFOL N/A 08/03/2022   Procedure: COLONOSCOPY WITH PROPOFOL;  Surgeon: Jaynie Collins, DO;  Location: [facility];  Service: Gastroenterology;  Laterality: N/A;   CYSTOSCOPY N/A 05/14/2020    Procedure: CYSTOSCOPY;  Surgeon: Nadara Mustard, MD;  Location: [facility];  Service: Gynecology;  Laterality: N/A;   DILATION AND CURETTAGE OF UTERUS     ENDOMETRIAL ABLATION W/ NOVASURE  05/29/2009   Dr Tiburcio Pea   left foot surgery     RECTAL EXAM UNDER ANESTHESIA N/A 11/30/2014   Procedure: RECTAL EXAM UNDER ANESTHESIA;  Surgeon: Tiney Rouge III, MD;  Location: [facility];  Service: General;  Laterality: N/A;   TOTAL LAPAROSCOPIC HYSTERECTOMY WITH BILATERAL SALPINGO OOPHORECTOMY Bilateral 05/14/2020   LAPAROSCOPIC SUPRACERVICAL HYSTERECTOMY WITH BILATERAL SALPINGO OOPHORECTOMY;  Surgeon:  Nadara Mustard, MD;  Location: [facility];  Service: Gynecology;  Laterality: Bilateral;    Family History  Problem Relation Age of Onset   Hypertension Mother    Heart disease Mother 88       "blockages"   Alzheimer's disease Mother    Diverticulitis Father    Hernia Father    Depression Father    Alzheimer's disease Father    Hypothyroidism Sister    Uterine cancer Sister 17   Hypothyroidism Sister    Ovarian cancer Sister    Hypertension Brother    Stroke Maternal Grandfather    Heart attack Maternal Grandfather    Stroke Paternal Grandmother    Hypothyroidism Daughter    Hypothyroidism Daughter    Breast cancer Maternal Aunt 81   Breast cancer Paternal Aunt 63   Ovarian cancer Paternal Aunt    Breast cancer Other 30   Pancreatic cancer Maternal Uncle    Colon cancer Maternal Uncle     Allergies  Allergen Reactions   Latex Rash   Amlodipine     Other reaction(s): Other (See Comments) Ankle swelling   Oxycodone Nausea And Vomiting    No problems with Hydrocodone   Rosuvastatin Nausea Only   Scopolamine Other (See Comments)    "FELT LIKE I COULDN'T BREATHE"       Latest Ref Rng & Units 08/28/2022   10:13 AM 05/08/2020   11:53 AM 05/12/2019   11:45 AM  CBC  WBC 4.0 - 10.5 K/uL 6.7  4.9  4.2   Hemoglobin 12.0 - 15.0 g/dL 40.1  02.7  25.3   Hematocrit 36.0 - 46.0 % 41.7  39.0  38.0    Platelets 150 - 400 K/uL 244  226  204       CMP     Component Value Date/Time   NA 133 (L) 08/28/2022 1013   NA 140 12/20/2015 1223   NA 139 10/10/2013 1448   K 3.0 (L) 08/28/2022 1013   K 4.1 10/10/2013 1448   CL 98 08/28/2022 1013   CL 104 10/10/2013 1448   CO2 23 08/28/2022 1013   CO2 29 10/10/2013 1448   GLUCOSE 134 (H) 08/28/2022 1013   GLUCOSE 81 10/10/2013 1448   BUN 16 08/28/2022 1013   BUN 15 12/20/2015 1223   BUN 12 10/10/2013 1448   CREATININE 0.88 08/28/2022 1013   CREATININE 0.84 05/12/2019 1145   CALCIUM 8.6 (L) 08/28/2022 1013   CALCIUM 9.2 10/10/2013 1448   PROT 7.1 08/28/2022 1013   PROT 7.2 09/14/2013 0738   ALBUMIN 3.9 08/28/2022 1013   ALBUMIN 3.6 09/14/2013 0738   AST 22 08/28/2022 1013   AST 15 09/14/2013 0738   ALT 16 08/28/2022 1013   ALT 20 09/14/2013 0738   ALKPHOS 68 08/28/2022 1013   ALKPHOS 90 09/14/2013 0738   BILITOT 1.0 08/28/2022 1013   BILITOT 0.3 09/14/2013 0738   GFRNONAA >60 08/28/2022 1013   GFRNONAA 76 05/12/2019 1145     No results found.     Assessment & Plan:   1. Varicose veins of leg with pain, right Recommend:  The patient has had successful ablation of the previously incompetent saphenous venous system but still has persistent symptoms of pain and swelling that are having a negative impact on daily life and daily activities.  Patient should undergo injection sclerotherapy to treat the residual varicosities.  The risks, benefits and alternative therapies were reviewed in detail with the patient.  All questions were answered.  The patient agrees to proceed with sclerotherapy at their convenience.  The patient will continue wearing the graduated compression stockings and using the over-the-counter pain medications to treat her symptoms.      2. Numbness and tingling of left leg The patient complains of numbness and tingling radiating from her left buttocks down her leg when she sits for extended periods.   This is very uncomfortable for her as she is not able to sit for any continuous amount of time.  She has strongly palpable pulses.  Based on this we will plan on referring the patient to neurosurgery for evaluation for possible intervention. - Ambulatory referral to Neurosurgery   Current Outpatient Medications on File Prior to Visit  Medication Sig Dispense Refill   BD INTEGRA SYRINGE 25G X 1" 3 ML MISC USE 1 SYRINGE MONTHLY     Cholecalciferol 125 MCG (5000 UT) capsule Take by mouth.     cyanocobalamin (VITAMIN B12) 1000 MCG/ML injection Inject into the muscle.     DUPIXENT 300 MG/2ML prefilled syringe Inject into the skin.     hydrOXYzine (ATARAX) 25 MG tablet Take 25 mg by mouth every 6 (six) hours as needed for anxiety.     levothyroxine (SYNTHROID) 112 MCG tablet TAKE ONE TABLET DAILY. EVERY SUNDAY TAKE AN EXTRA ONE-HALF TABLET. (Patient taking differently: Take 112-168 mcg by mouth See admin instructions. Take 1.5 tablets (168 mcg) by mouth on Sundays & take 1 tablet (112 mcg) by mouth on all other days.) 90 tablet 3   losartan-hydrochlorothiazide (HYZAAR) 100-12.5 MG tablet Take 1 tablet by mouth daily.     spironolactone (ALDACTONE) 25 MG tablet Take 1 tablet by mouth daily.     tacrolimus (PROTOPIC) 0.1 % ointment SMARTSIG:1 Topical Daily     Current Facility-Administered Medications on File Prior to Visit  Medication Dose Route Frequency Provider Last Rate Last Admin   betamethasone acetate-betamethasone sodium phosphate (CELESTONE) injection 3 mg  3 mg Intra-articular Once Felecia Shelling, DPM        There are no Patient Instructions on file for this visit. No follow-ups on file.   Georgiana Spinner, NP

## 2022-12-18 ENCOUNTER — Inpatient Hospital Stay
Admission: RE | Admit: 2022-12-18 | Discharge: 2022-12-18 | Disposition: A | Discharge status: Home / Self Care | Payer: Self-pay | Source: Ambulatory Visit | Attending: Orthopedic Surgery | Admitting: Orthopedic Surgery

## 2022-12-18 ENCOUNTER — Other Ambulatory Visit: Payer: Self-pay | Admitting: Family Medicine

## 2022-12-18 ENCOUNTER — Ambulatory Visit (INDEPENDENT_AMBULATORY_CARE_PROVIDER_SITE_OTHER): Payer: BC Managed Care – PPO | Admitting: Vascular Surgery

## 2022-12-18 DIAGNOSIS — Z049 Encounter for examination and observation for unspecified reason: Secondary | ICD-10-CM

## 2022-12-18 NOTE — Progress Notes (Unsigned)
Referring Physician:  Georgiana Spinner, NP [address] [place],  [place] [address]  Primary Physician:  Luciana Axe, NP  History of Present Illness: 12/18/2022*** Desiree Walker has a history of HTN, varicose veins, sleep apnea, hypothyroidism due to Hashimoto's, hyperlipidemia.   Referral from vascular- she had laser ablation of right leg veins on 11/20/22. She is being set up for sclerotherapy of her residual varicosities.   She has *** left buttock and posterio leg pain with numbness and tingling that is worse with any significant sitting.   Duration: *** Location: *** Quality: *** Severity: ***  Precipitating: aggravated by *** Modifying factors: made better by *** Weakness: none Timing: *** Bowel/Bladder Dysfunction: none  Conservative measures:  Physical therapy: ***  Multimodal medical therapy including regular antiinflammatories: ***  Injections: *** epidural steroid injections  Past Surgery: ***  Desiree Walker has ***no symptoms of cervical myelopathy.  The symptoms are causing a significant impact on the patient's life.   Review of Systems:  A 10 point review of systems is negative, except for the pertinent positives and negatives detailed in the HPI.  Past Medical History: Past Medical History:  Diagnosis Date   Anemia    H/O   Anxiety    Arthritis    BRCA negative 08/2018   MyRisk neg except NTHL1 VUS   Complication of anesthesia    Family history of breast cancer 08/2018   IBIS=8.65/riskscore=7.9%   Family history of ovarian cancer    MyRisk neg except NTHL1 VUS   GERD (gastroesophageal reflux disease)    NO MEDS   Hyperlipidemia    Hypertension    Hypothyroidism    Hashimoto's per patient   IFG (impaired fasting glucose) 08/05/2015   Metabolic syndrome 10/13/2016   Palpitations 2012   Evaluated by Julien Nordmann, MD, Holter moniter   PONV (postoperative nausea and vomiting)    Pre-diabetes    Rectal mass    Thyroid  disease    unspecified hypothyroidism    Past Surgical History: Past Surgical History:  Procedure Laterality Date   ANAL FISTULOTOMY N/A 11/30/2014   Procedure: ANAL FISTULOTOMY;  Surgeon: Tiney Rouge III, MD;  Location: [facility];  Service: General;  Laterality: N/A;   BREAST BIOPSY Right 09/08/2018   affirm bx of mass, coil clip, ORGANIZING FAT NECROSIS AND ADJACENT CHANGES SUGGESTIVE OF RUPTURED CYST   BREAST BIOPSY Right 01/13/2021   PREDOMINANTLY BENIGN FIBROADIPOSE TISSUE SCANT FRAGMENTS OF UNREMARKABLE LYMPH NODE   CESAREAN SECTION     COLONOSCOPY  2014   COLONOSCOPY WITH PROPOFOL N/A 08/03/2022   Procedure: COLONOSCOPY WITH PROPOFOL;  Surgeon: Jaynie Collins, DO;  Location: [facility];  Service: Gastroenterology;  Laterality: N/A;   CYSTOSCOPY N/A 05/14/2020   Procedure: CYSTOSCOPY;  Surgeon: Nadara Mustard, MD;  Location: [facility];  Service: Gynecology;  Laterality: N/A;   DILATION AND CURETTAGE OF UTERUS     ENDOMETRIAL ABLATION W/ NOVASURE  05/29/2009   Dr Tiburcio Pea   left foot surgery     RECTAL EXAM UNDER ANESTHESIA N/A 11/30/2014   Procedure: RECTAL EXAM UNDER ANESTHESIA;  Surgeon: Tiney Rouge III, MD;  Location: [facility];  Service: General;  Laterality: N/A;   TOTAL LAPAROSCOPIC HYSTERECTOMY WITH BILATERAL SALPINGO OOPHORECTOMY Bilateral 05/14/2020   LAPAROSCOPIC SUPRACERVICAL HYSTERECTOMY WITH BILATERAL SALPINGO OOPHORECTOMY;  Surgeon: Nadara Mustard, MD;  Location: [facility];  Service: Gynecology;  Laterality: Bilateral;    Allergies: Allergies as of 12/24/2022 - Review Complete 12/14/2022  Allergen Reaction Noted   Latex Rash 06/05/2022   Amlodipine  10/06/2021   Oxycodone Nausea And Vomiting 05/06/2021   Rosuvastatin Nausea Only 05/06/2021   Scopolamine Other (See Comments) 11/22/2014    Medications: Outpatient Encounter Medications as of 12/24/2022  Medication Sig   BD INTEGRA SYRINGE 25G X 1" 3 ML MISC USE 1 SYRINGE MONTHLY   Cholecalciferol 125  MCG (5000 UT) capsule Take by mouth.   cyanocobalamin (VITAMIN B12) 1000 MCG/ML injection Inject into the muscle.   DUPIXENT 300 MG/2ML prefilled syringe Inject into the skin.   hydrOXYzine (ATARAX) 25 MG tablet Take 25 mg by mouth every 6 (six) hours as needed for anxiety.   levothyroxine (SYNTHROID) 112 MCG tablet TAKE ONE TABLET DAILY. EVERY SUNDAY TAKE AN EXTRA ONE-HALF TABLET. (Patient taking differently: Take 112-168 mcg by mouth See admin instructions. Take 1.5 tablets (168 mcg) by mouth on Sundays & take 1 tablet (112 mcg) by mouth on all other days.)   losartan-hydrochlorothiazide (HYZAAR) 100-12.5 MG tablet Take 1 tablet by mouth daily.   spironolactone (ALDACTONE) 25 MG tablet Take 1 tablet by mouth daily.   tacrolimus (PROTOPIC) 0.1 % ointment SMARTSIG:1 Topical Daily   Facility-Administered Encounter Medications as of 12/24/2022  Medication   betamethasone acetate-betamethasone sodium phosphate (CELESTONE) injection 3 mg    Social History: Social History   Tobacco Use   Smoking status: Never    Passive exposure: Never   Smokeless tobacco: Never  Vaping Use   Vaping status: Never Used  Substance Use Topics   Alcohol use: No    Alcohol/week: 0.0 standard drinks of alcohol   Drug use: No    Family Medical History: Family History  Problem Relation Age of Onset   Hypertension Mother    Heart disease Mother 55       "blockages"   Alzheimer's disease Mother    Diverticulitis Father    Hernia Father    Depression Father    Alzheimer's disease Father    Hypothyroidism Sister    Uterine cancer Sister 7   Hypothyroidism Sister    Ovarian cancer Sister    Hypertension Brother    Stroke Maternal Grandfather    Heart attack Maternal Grandfather    Stroke Paternal Grandmother    Hypothyroidism Daughter    Hypothyroidism Daughter    Breast cancer Maternal Aunt 43   Breast cancer Paternal Aunt 34   Ovarian cancer Paternal Aunt    Breast cancer Other 30   Pancreatic  cancer Maternal Uncle    Colon cancer Maternal Uncle     Physical Examination: There were no vitals filed for this visit.  General: Patient is well developed, well nourished, calm, collected, and in no apparent distress. Attention to examination is appropriate.  Respiratory: Patient is breathing without any difficulty.   NEUROLOGICAL:     Awake, alert, oriented to person, place, and time.  Speech is clear and fluent. Fund of knowledge is appropriate.   Cranial Nerves: Pupils equal round and reactive to light.  Facial tone is symmetric.    *** ROM of cervical spine *** pain *** posterior cervical tenderness. *** tenderness in bilateral trapezial region.   *** ROM of lumbar spine *** pain *** posterior lumbar tenderness.   No abnormal lesions on exposed skin.   Strength: Side Biceps Triceps Deltoid Interossei Grip Wrist Ext. Wrist Flex.  R 5 5 5 5 5 5 5   L 5 5 5 5 5 5 5    Side Iliopsoas Quads Hamstring PF  DF EHL  R 5 5 5 5 5 5   L 5 5 5 5 5 5    Reflexes are ***2+ and symmetric at the biceps, brachioradialis, patella and achilles.   Hoffman's is absent.  Clonus is not present.   Bilateral upper and lower extremity sensation is intact to light touch.     Gait is normal.   ***No difficulty with tandem gait.    Medical Decision Making  Imaging: No lumbar imaging.   Assessment and Plan: Desiree Walker is a pleasant 62 y.o. female has ***  Treatment options discussed with patient and following plan made:   - Order for physical therapy for *** spine ***. Patient to call to schedule appointment. *** - Continue current medications including ***. Reviewed dosing and side effects.  - Prescription for ***. Reviewed dosing and side effects. Take with food.  - Prescription for *** to take prn muscle spasms. Reviewed dosing and side effects. Discussed this can cause drowsiness.  - MRI of *** to further evaluate *** radiculopathy. No improvement time or medications (***).  - Referral  to PMR at [facility] to discuss possible *** injections.  - Will schedule phone visit to review MRI results once I get them back.   I spent a total of *** minutes in face-to-face and non-face-to-face activities related to this patient's care today including review of outside records, review of imaging, review of symptoms, physical exam, discussion of differential diagnosis, discussion of treatment options, and documentation.   Thank you for involving me in the care of this patient.   Drake Leach PA-C Dept. of Neurosurgery

## 2022-12-24 ENCOUNTER — Ambulatory Visit
Admission: RE | Admit: 2022-12-24 | Discharge: 2022-12-24 | Disposition: A | Discharge status: Home / Self Care | Payer: BC Managed Care – PPO | Source: Ambulatory Visit | Attending: Orthopedic Surgery | Admitting: Orthopedic Surgery

## 2022-12-24 ENCOUNTER — Ambulatory Visit: Payer: BC Managed Care – PPO | Admitting: Orthopedic Surgery

## 2022-12-24 ENCOUNTER — Ambulatory Visit
Admission: RE | Admit: 2022-12-24 | Discharge: 2022-12-24 | Disposition: A | Discharge status: Home / Self Care | Payer: BC Managed Care – PPO | Attending: Orthopedic Surgery | Admitting: Orthopedic Surgery

## 2022-12-24 ENCOUNTER — Encounter: Payer: Self-pay | Admitting: Orthopedic Surgery

## 2022-12-24 VITALS — BP 124/78 | Ht 67.0 in | Wt 228.0 lb

## 2022-12-24 DIAGNOSIS — M5442 Lumbago with sciatica, left side: Secondary | ICD-10-CM | POA: Insufficient documentation

## 2022-12-24 DIAGNOSIS — M5416 Radiculopathy, lumbar region: Secondary | ICD-10-CM

## 2022-12-24 MED ORDER — MELOXICAM 15 MG PO TABS
15.0000 mg | ORAL_TABLET | Freq: Every day | ORAL | 1 refills | Status: DC
Start: 2022-12-24 — End: 2022-12-29

## 2022-12-24 NOTE — Patient Instructions (Signed)
It was so nice to see you today. Thank you so much for coming in.    I think the pain in your left butt and leg may be coming from your lower back.   I ordered xrays of your lower back. You can get these at [facility] (building with the white pillars) off of [address]. The address is [address] [place], [place] [address]. You do not need any appointment. I will message you with results- it has been taking 10-14 days for me to get them back.   I sent physical therapy orders to [facility] in [place]. You can call them at [phone] if you don't hear from them to schedule your visit.   I sent a prescription for meloxicam to help with pain and inflammation. Take as directed with food. Do not take motrin with this medication.   I will see you back in 6-8 weeks. Please do not hesitate to call if you have any questions or concerns. You can also message me in MyChart.   Drake Leach PA-C [phone]     The physicians and staff at [facility] are committed to providing excellent care. You may receive a survey requesting feedback about your experience at our office. We strive to receive "very good" responses to the survey questions. If you feel that your experience would prevent you from giving the office a "very good " response, please contact our office to try to remedy the situation. We may be reached at [phone]. Thank you for taking the time out of your busy day to complete the survey.

## 2022-12-29 ENCOUNTER — Telehealth: Payer: Self-pay | Admitting: Orthopedic Surgery

## 2022-12-29 DIAGNOSIS — M5442 Lumbago with sciatica, left side: Secondary | ICD-10-CM

## 2022-12-29 DIAGNOSIS — M5416 Radiculopathy, lumbar region: Secondary | ICD-10-CM

## 2022-12-29 MED ORDER — DICLOFENAC SODIUM 75 MG PO TBEC
75.0000 mg | DELAYED_RELEASE_TABLET | Freq: Two times a day (BID) | ORAL | 1 refills | Status: DC
Start: 2022-12-29 — End: 2023-02-18

## 2022-12-29 NOTE — Telephone Encounter (Signed)
Would stop meloxicam.   Can try voltaren 75mg  twice a day. Be sure she takes it with food. Will send to pharmacy. Please let her know.

## 2022-12-29 NOTE — Telephone Encounter (Signed)
I spoke with the patient and notified her to stop the Meloxicam, she verbalized understanding. She will try the diclofenac pill and will let us know if that makes her feel bad.

## 2022-12-29 NOTE — Telephone Encounter (Signed)
Patient has called stating she is still in pain. Patient does have an appointment with [facility] on 10.22.24. Patient was given MELOXICAM 15mg  but she does not like how it makes her feel: loopy, dizzy, nauseous. Considering a lower dosage or a different pain medication. Pt did stop taking the medication and is currently only taking ibuprofen for the pain, please advise.

## 2023-01-18 ENCOUNTER — Encounter: Payer: Self-pay | Admitting: Orthopedic Surgery

## 2023-01-18 NOTE — Telephone Encounter (Signed)
Lumbar xrays dated 12/24/22:  FINDINGS: Mild disc space narrowing and marginal osteophyte formation identified throughout the visualized thoracolumbar spine. No compression deformities or spondylolisthesis. No osteolytic or osteoblastic changes.   IMPRESSION: Mild multilevel degenerative changes.     Electronically Signed   By: Layla Maw M.D.   On: 01/16/2023 13:20   I have personally reviewed the images and agree with the above interpretation.

## 2023-01-19 ENCOUNTER — Ambulatory Visit (INDEPENDENT_AMBULATORY_CARE_PROVIDER_SITE_OTHER): Payer: BC Managed Care – PPO | Admitting: Nurse Practitioner

## 2023-01-19 ENCOUNTER — Encounter (INDEPENDENT_AMBULATORY_CARE_PROVIDER_SITE_OTHER): Payer: Self-pay | Admitting: Nurse Practitioner

## 2023-01-19 VITALS — BP 157/87 | HR 107 | Resp 18 | Ht 67.0 in | Wt 222.6 lb

## 2023-01-19 DIAGNOSIS — M7989 Other specified soft tissue disorders: Secondary | ICD-10-CM | POA: Diagnosis not present

## 2023-01-19 DIAGNOSIS — I10 Essential (primary) hypertension: Secondary | ICD-10-CM | POA: Diagnosis not present

## 2023-01-19 DIAGNOSIS — I83811 Varicose veins of right lower extremities with pain: Secondary | ICD-10-CM | POA: Diagnosis not present

## 2023-01-24 NOTE — Progress Notes (Signed)
Subjective:    Patient ID: Desiree Walker, female    DOB: [birth year], 62 y.o.   MRN: [id number] Chief Complaint  Patient presents with   Follow-up    wants to talk about sclerotherapy    The patient is a 62 year old female who returns today to discuss sclerotherapy.  She recently underwent laser ablation of her right great saphenous vein.  The pain is improved but she still has some swelling.  Initially when discussed sclerotherapy the patient was interested but she has some additional questions.    Review of Systems  Cardiovascular:  Positive for leg swelling.  All other systems reviewed and are negative.      Objective:   Physical Exam Vitals reviewed.  HENT:     Head: Normocephalic.  Cardiovascular:     Rate and Rhythm: Normal rate.     Pulses: Normal pulses.  Pulmonary:     Effort: Pulmonary effort is normal.  Musculoskeletal:     Right lower leg: Edema present.  Skin:    General: Skin is warm and dry.  Neurological:     Mental Status: She is alert and oriented to person, place, and time.  Psychiatric:        Mood and Affect: Mood normal.        Behavior: Behavior normal.        Thought Content: Thought content normal.        Judgment: Judgment normal.     BP (!) 157/87 (BP Location: Left Arm)   Pulse (!) 107   Resp 18   Ht 5\' 7"  (1.702 m)   Wt 222 lb 9.6 oz (101 kg)   BMI 34.86 kg/m   Past Medical History:  Diagnosis Date   Anemia    H/O   Anxiety    Arthritis    BRCA negative 08/2018   MyRisk neg except NTHL1 VUS   Complication of anesthesia    Family history of breast cancer 08/2018   IBIS=8.65/riskscore=7.9%   Family history of ovarian cancer    MyRisk neg except NTHL1 VUS   GERD (gastroesophageal reflux disease)    NO MEDS   Hyperlipidemia    Hypertension    Hypothyroidism    Hashimoto's per patient   IFG (impaired fasting glucose) 08/05/2015   Metabolic syndrome 10/13/2016   Palpitations 2012   Evaluated by Julien Nordmann, MD, Holter  moniter   PONV (postoperative nausea and vomiting)    Pre-diabetes    Rectal mass    Thyroid disease    unspecified hypothyroidism    Social History   Socioeconomic History   Marital status: Married    Spouse name: Molly Maduro A. Boyers   Number of children: 3   Years of education: Not on file   Highest education level: Associate degree: occupational, Scientist, product/process development, or vocational program  Occupational History   Occupation: Programmer, systems Pediatrics  Tobacco Use   Smoking status: Never    Passive exposure: Never   Smokeless tobacco: Never  Vaping Use   Vaping status: Never Used  Substance and Sexual Activity   Alcohol use: No    Alcohol/week: 0.0 standard drinks of alcohol   Drug use: No   Sexual activity: Yes    Birth control/protection: Post-menopausal  Other Topics Concern   Not on file  Social History Narrative   Not on file   Social Determinants of Health   Financial Resource Strain: Low Risk  (09/21/2017)   Overall Financial Resource Strain (CARDIA)    Difficulty  of Paying Living Expenses: Not hard at all  Food Insecurity: No Food Insecurity (09/21/2017)   Hunger Vital Sign    Worried About Running Out of Food in the Last Year: Never true    Ran Out of Food in the Last Year: Never true  Transportation Needs: No Transportation Needs (09/21/2017)   PRAPARE - Administrator, Civil Service (Medical): No    Lack of Transportation (Non-Medical): No  Physical Activity: Inactive (09/21/2017)   Exercise Vital Sign    Days of Exercise per Week: 0 days    Minutes of Exercise per Session: 0 min  Stress: No Stress Concern Present (09/21/2017)   [employer] of Occupational Health - Occupational Stress Questionnaire    Feeling of Stress : Not at all  Social Connections: Unknown (12/18/2022)   [facility]   Social Network    Social Network: Not on file  Intimate Partner Violence: Unknown (12/18/2022)   Received from [facility]   HITS    Physically  Hurt: Not on file    Insult or Talk Down To: Not on file    Threaten Physical Harm: Not on file    Scream or Curse: Not on file    Past Surgical History:  Procedure Laterality Date   ANAL FISTULOTOMY N/A 11/30/2014   Procedure: ANAL FISTULOTOMY;  Surgeon: Tiney Rouge III, MD;  Location: [facility] ORS;  Service: General;  Laterality: N/A;   BREAST BIOPSY Right 09/08/2018   affirm bx of mass, coil clip, ORGANIZING FAT NECROSIS AND ADJACENT CHANGES SUGGESTIVE OF RUPTURED CYST   BREAST BIOPSY Right 01/13/2021   PREDOMINANTLY BENIGN FIBROADIPOSE TISSUE SCANT FRAGMENTS OF UNREMARKABLE LYMPH NODE   CESAREAN SECTION     COLONOSCOPY  2014   COLONOSCOPY WITH PROPOFOL N/A 08/03/2022   Procedure: COLONOSCOPY WITH PROPOFOL;  Surgeon: Jaynie Collins, DO;  Location: [facility];  Service: Gastroenterology;  Laterality: N/A;   CYSTOSCOPY N/A 05/14/2020   Procedure: CYSTOSCOPY;  Surgeon: Nadara Mustard, MD;  Location: [facility] ORS;  Service: Gynecology;  Laterality: N/A;   DILATION AND CURETTAGE OF UTERUS     ENDOMETRIAL ABLATION W/ NOVASURE  05/29/2009   Dr Tiburcio Pea   left foot surgery     RECTAL EXAM UNDER ANESTHESIA N/A 11/30/2014   Procedure: RECTAL EXAM UNDER ANESTHESIA;  Surgeon: Tiney Rouge III, MD;  Location: [facility] ORS;  Service: General;  Laterality: N/A;   TOTAL LAPAROSCOPIC HYSTERECTOMY WITH BILATERAL SALPINGO OOPHORECTOMY Bilateral 05/14/2020   LAPAROSCOPIC SUPRACERVICAL HYSTERECTOMY WITH BILATERAL SALPINGO OOPHORECTOMY;  Surgeon: Nadara Mustard, MD;  Location: [facility] ORS;  Service: Gynecology;  Laterality: Bilateral;    Family History  Problem Relation Age of Onset   Hypertension Mother    Heart disease Mother 46       "blockages"   Alzheimer's disease Mother    Diverticulitis Father    Hernia Father    Depression Father    Alzheimer's disease Father    Hypothyroidism Sister    Uterine cancer Sister 54   Hypothyroidism Sister    Ovarian cancer Sister    Hypertension Brother     Stroke Maternal Grandfather    Heart attack Maternal Grandfather    Stroke Paternal Grandmother    Hypothyroidism Daughter    Hypothyroidism Daughter    Breast cancer Maternal Aunt 22   Breast cancer Paternal Aunt 76   Ovarian cancer Paternal Aunt    Breast cancer Other 30   Pancreatic cancer Maternal Uncle    Colon cancer  Maternal Uncle     Allergies  Allergen Reactions   Latex Rash   Amlodipine     Other reaction(s): Other (See Comments) Ankle swelling   Oxycodone Nausea And Vomiting    No problems with Hydrocodone   Rosuvastatin Nausea Only   Scopolamine Other (See Comments)    "FELT LIKE I COULDN'T BREATHE"       Latest Ref Rng & Units 08/28/2022   10:13 AM 05/08/2020   11:53 AM 05/12/2019   11:45 AM  CBC  WBC 4.0 - 10.5 K/uL 6.7  4.9  4.2   Hemoglobin 12.0 - 15.0 g/dL 88.4  16.6  06.3   Hematocrit 36.0 - 46.0 % 41.7  39.0  38.0   Platelets 150 - 400 K/uL 244  226  204       CMP     Component Value Date/Time   NA 133 (L) 08/28/2022 1013   NA 140 12/20/2015 1223   NA 139 10/10/2013 1448   K 3.0 (L) 08/28/2022 1013   K 4.1 10/10/2013 1448   CL 98 08/28/2022 1013   CL 104 10/10/2013 1448   CO2 23 08/28/2022 1013   CO2 29 10/10/2013 1448   GLUCOSE 134 (H) 08/28/2022 1013   GLUCOSE 81 10/10/2013 1448   BUN 16 08/28/2022 1013   BUN 15 12/20/2015 1223   BUN 12 10/10/2013 1448   CREATININE 0.88 08/28/2022 1013   CREATININE 0.84 05/12/2019 1145   CALCIUM 8.6 (L) 08/28/2022 1013   CALCIUM 9.2 10/10/2013 1448   PROT 7.1 08/28/2022 1013   PROT 7.2 09/14/2013 0738   ALBUMIN 3.9 08/28/2022 1013   ALBUMIN 3.6 09/14/2013 0738   AST 22 08/28/2022 1013   AST 15 09/14/2013 0738   ALT 16 08/28/2022 1013   ALT 20 09/14/2013 0738   ALKPHOS 68 08/28/2022 1013   ALKPHOS 90 09/14/2013 0738   BILITOT 1.0 08/28/2022 1013   BILITOT 0.3 09/14/2013 0738   GFRNONAA >60 08/28/2022 1013   GFRNONAA 76 05/12/2019 1145     No results found.     Assessment & Plan:   1.  Varicose veins of leg with pain, right At this time following discussion the patient elects not to move forward with sclerotherapy.  She will continue with conservative therapies as outlined below.  2. Leg swelling The patient still has some leg swelling.  Patient will continue with use of medical grade compression, elevation and activity.  Will have her return in 6 months or sooner if there are issues.  3. Hypertension goal BP (blood pressure) < 140/90 Patient does have slightly elevated blood pressure today.  She is advised to continue to monitor and if remains elevated to discuss with her PCP about possible medication adjustment.   Current Outpatient Medications on File Prior to Visit  Medication Sig Dispense Refill   BD INTEGRA SYRINGE 25G X 1" 3 ML MISC USE 1 SYRINGE MONTHLY     Cholecalciferol 125 MCG (5000 UT) capsule Take by mouth.     cyanocobalamin (VITAMIN B12) 1000 MCG/ML injection Inject into the muscle.     diclofenac (VOLTAREN) 75 MG EC tablet Take 1 tablet (75 mg total) by mouth 2 (two) times daily after a meal. 60 tablet 1   DUPIXENT 300 MG/2ML prefilled syringe Inject into the skin.     hydrOXYzine (ATARAX) 25 MG tablet Take 25 mg by mouth every 6 (six) hours as needed for anxiety.     levothyroxine (SYNTHROID) 112 MCG tablet TAKE ONE TABLET  DAILY. EVERY SUNDAY TAKE AN EXTRA ONE-HALF TABLET. (Patient taking differently: Take 112-168 mcg by mouth See admin instructions. Take 1.5 tablets (168 mcg) by mouth on Sundays & take 1 tablet (112 mcg) by mouth on all other days.) 90 tablet 3   losartan-hydrochlorothiazide (HYZAAR) 100-12.5 MG tablet Take 1 tablet by mouth daily.     spironolactone (ALDACTONE) 25 MG tablet Take 1 tablet by mouth daily.     tacrolimus (PROTOPIC) 0.1 % ointment SMARTSIG:1 Topical Daily     Current Facility-Administered Medications on File Prior to Visit  Medication Dose Route Frequency Provider Last Rate Last Admin   betamethasone acetate-betamethasone  sodium phosphate (CELESTONE) injection 3 mg  3 mg Intra-articular Once Felecia Shelling, DPM        There are no Patient Instructions on file for this visit. No follow-ups on file.   Georgiana Spinner, NP

## 2023-02-02 ENCOUNTER — Ambulatory Visit: Payer: BC Managed Care – PPO | Admitting: Podiatry

## 2023-02-16 NOTE — Progress Notes (Unsigned)
Referring Physician:  Luciana Axe, NP [address] [place],  [place] [address]  Primary Physician:  Luciana Axe, NP  History of Present Illness: [name] has a history of HTN, varicose veins, sleep apnea, hypothyroidism due to Hashimoto's, hyperlipidemia.   Last seen by me on 12/24/22 for left buttock pain with numbness and tingling. She has known mild lumbar spondylosis with diffuse mild DDD.   She was given mobic and had side effects so advised to stop it. She was started on voltaren. She went to PT and was discharged on 01/21/23 after 5 visits as her back pain was improved.   She is here for follow up.   She is not having much back pain (is intermittent and worse with bending), but continues with more constant left posterior leg pain from her butt to her foot. She has numbness and tingling. No weakness. Pain is worse with prolonged sitting. No right leg pain.   Did not take voltaren.   Conservative measures:  Physical therapy: discharged on 01/21/23 after 5 visits as her back pain was improved Multimodal medical therapy including regular antiinflammatories: tylenol, motrin  Injections: No epidural steroid injections  Past Surgery: No spine surgery  SENDI MESHELL has no symptoms of cervical myelopathy.  The symptoms are causing a significant impact on the patient's life.   Review of Systems:  A 10 point review of systems is negative, except for the pertinent positives and negatives detailed in the HPI.  Past Medical History: Past Medical History:  Diagnosis Date   Anemia    H/O   Anxiety    Arthritis    BRCA negative 08/2018   MyRisk neg except NTHL1 VUS   Complication of anesthesia    Family history of breast cancer 08/2018   IBIS=8.65/riskscore=7.9%   Family history of ovarian cancer    MyRisk neg except NTHL1 VUS   GERD (gastroesophageal reflux disease)    NO MEDS   Hyperlipidemia    Hypertension    Hypothyroidism    Hashimoto's per  patient   IFG (impaired fasting glucose) 08/05/2015   Metabolic syndrome 10/13/2016   Palpitations 2012   Evaluated by Julien Nordmann, MD, Holter moniter   PONV (postoperative nausea and vomiting)    Pre-diabetes    Rectal mass    Thyroid disease    unspecified hypothyroidism    Past Surgical History: Past Surgical History:  Procedure Laterality Date   ANAL FISTULOTOMY N/A 11/30/2014   Procedure: ANAL FISTULOTOMY;  Surgeon: Tiney Rouge III, MD;  Location: [facility] ORS;  Service: General;  Laterality: N/A;   BREAST BIOPSY Right 09/08/2018   affirm bx of mass, coil clip, ORGANIZING FAT NECROSIS AND ADJACENT CHANGES SUGGESTIVE OF RUPTURED CYST   BREAST BIOPSY Right 01/13/2021   PREDOMINANTLY BENIGN FIBROADIPOSE TISSUE SCANT FRAGMENTS OF UNREMARKABLE LYMPH NODE   CESAREAN SECTION     COLONOSCOPY  2014   COLONOSCOPY WITH PROPOFOL N/A 08/03/2022   Procedure: COLONOSCOPY WITH PROPOFOL;  Surgeon: Jaynie Collins, DO;  Location[facility];  Service: Gastroenterology;  Laterality: N/A;   CYSTOSCOPY N/A 05/14/2020   Procedure: CYSTOSCOPY;  Surgeon: Nadara Mustard, MD;  Location: [facility] ORS;  Service: Gynecology;  Laterality: N/A;   DILATION AND CURETTAGE OF UTERUS     ENDOMETRIAL ABLATION W/ NOVASURE  05/29/2009   Dr Tiburcio Pea   left foot surgery     RECTAL EXAM UNDER ANESTHESIA N/A 11/30/2014   Procedure: RECTAL EXAM UNDER ANESTHESIA;  Surgeon: Tiney Rouge  III, MD;  Location: [facility] ORS;  Service: General;  Laterality: N/A;   TOTAL LAPAROSCOPIC HYSTERECTOMY WITH BILATERAL SALPINGO OOPHORECTOMY Bilateral 05/14/2020   LAPAROSCOPIC SUPRACERVICAL HYSTERECTOMY WITH BILATERAL SALPINGO OOPHORECTOMY;  Surgeon: Nadara Mustard, MD;  Location: [facility] OR[facility];  Service: Gynecology;  Laterality: Bilateral;    Allergies: Allergies as of 02/18/2023 - Review Complete 01/19/2023  Allergen Reaction Noted   Latex Rash 06/05/2022   Amlodipine  10/06/2021   Oxycodone Nausea And Vomiting 05/06/2021   Rosuvastatin  Nausea Only 05/06/2021   Scopolamine Other (See Comments) 11/22/2014    Medications: Outpatient Encounter Medications as of 02/18/2023  Medication Sig   BD INTEGRA SYRINGE 25G X 1" 3 ML MISC USE 1 SYRINGE MONTHLY   Cholecalciferol 125 MCG (5000 UT) capsule Take by mouth.   cyanocobalamin (VITAMIN B12) 1000 MCG/ML injection Inject into the muscle.   diclofenac (VOLTAREN) 75 MG EC tablet Take 1 tablet (75 mg total) by mouth 2 (two) times daily after a meal.   DUPIXENT 300 MG/2ML prefilled syringe Inject into the skin.   hydrOXYzine (ATARAX) 25 MG tablet Take 25 mg by mouth every 6 (six) hours as needed for anxiety.   levothyroxine (SYNTHROID) 112 MCG tablet TAKE ONE TABLET DAILY. EVERY SUNDAY TAKE AN EXTRA ONE-HALF TABLET. (Patient taking differently: Take 112-168 mcg by mouth See admin instructions. Take 1.5 tablets (168 mcg) by mouth on Sundays & take 1 tablet (112 mcg) by mouth on all other days.)   losartan-hydrochlorothiazide (HYZAAR) 100-12.5 MG tablet Take 1 tablet by mouth daily.   spironolactone (ALDACTONE) 25 MG tablet Take 1 tablet by mouth daily.   tacrolimus (PROTOPIC) 0.1 % ointment SMARTSIG:1 Topical Daily   Facility-Administered Encounter Medications as of 02/18/2023  Medication   betamethasone acetate-betamethasone sodium phosphate (CELESTONE) injection 3 mg    Social History: Social History   Tobacco Use   Smoking status: Never    Passive exposure: Never   Smokeless tobacco: Never  Vaping Use   Vaping status: Never Used  Substance Use Topics   Alcohol use: No    Alcohol/week: 0.0 standard drinks of alcohol   Drug use: No    Family Medical History: Family History  Problem Relation Age of Onset   Hypertension Mother    Heart disease Mother 13       "blockages"   Alzheimer's disease Mother    Diverticulitis Father    Hernia Father    Depression Father    Alzheimer's disease Father    Hypothyroidism Sister    Uterine cancer Sister 4   Hypothyroidism  Sister    Ovarian cancer Sister    Hypertension Brother    Stroke Maternal Grandfather    Heart attack Maternal Grandfather    Stroke Paternal Grandmother    Hypothyroidism Daughter    Hypothyroidism Daughter    Breast cancer Maternal Aunt 43   Breast cancer Paternal Aunt 71   Ovarian cancer Paternal Aunt    Breast cancer Other 30   Pancreatic cancer Maternal Uncle    Colon cancer Maternal Uncle     Physical Examination: There were no vitals filed for this visit.    Awake, alert, oriented to person, place, and time.  Speech is clear and fluent. Fund of knowledge is appropriate.   Cranial Nerves: Pupils equal round and reactive to light.  Facial tone is symmetric.    Mild central lower lumbar tenderness.   No abnormal lesions on exposed skin.   Strength: Side Iliopsoas Quads Hamstring PF DF EHL  R 5 5 5 5 5 5   L 5 5 5 5 5 5    Reflexes are 2+ and symmetric at the patella and achilles.     Clonus is not present.   Bilateral lower extremity sensation is intact to light touch.     No pain with ROM of left hip.   She walks with a chronic limp since left foot surgery years ago.   Medical Decision Making  Imaging: None  Assessment and Plan: Ms. Petterson is a pleasant 62 y.o. female who had some improvement with PT.   She is not having much back pain (is intermittent and worse with bending), but continues with more constant left posterior leg pain from her butt to her foot. She has numbness and tingling. No weakness.  She has known mild lumbar spondylosis with diffuse mild DDD. Left leg pain appears lumbar mediated.   Treatment options discussed with patient and following plan made:   - MRI of lumbar spine to further evaluate left lumbar radiculopathy. No improvement with PT, time, or medications. Will do WIDE BORE in [place].  - Voltaren sent to pharmacy. Reviewed dosing and side effects. Take with food.  - Follow up for in person visit with me to review MRI results.   I  spent a total of 20 minutes in face-to-face and non-face-to-face activities related to this patient's care today including review of outside records, review of imaging, review of symptoms, physical exam, discussion of differential diagnosis, discussion of treatment options, and documentation.   Thank you for involving me in the care of this patient.   Drake Leach PA-C Dept. of Neurosurgery

## 2023-02-18 ENCOUNTER — Encounter: Payer: Self-pay | Admitting: Orthopedic Surgery

## 2023-02-18 ENCOUNTER — Ambulatory Visit: Payer: BC Managed Care – PPO | Admitting: Orthopedic Surgery

## 2023-02-18 VITALS — BP 138/90 | Ht 67.0 in | Wt 224.0 lb

## 2023-02-18 DIAGNOSIS — M5416 Radiculopathy, lumbar region: Secondary | ICD-10-CM

## 2023-02-18 DIAGNOSIS — M47816 Spondylosis without myelopathy or radiculopathy, lumbar region: Secondary | ICD-10-CM

## 2023-02-18 DIAGNOSIS — M4726 Other spondylosis with radiculopathy, lumbar region: Secondary | ICD-10-CM | POA: Diagnosis not present

## 2023-02-18 DIAGNOSIS — M5442 Lumbago with sciatica, left side: Secondary | ICD-10-CM | POA: Diagnosis not present

## 2023-02-18 DIAGNOSIS — M51362 Other intervertebral disc degeneration, lumbar region with discogenic back pain and lower extremity pain: Secondary | ICD-10-CM

## 2023-02-18 MED ORDER — DICLOFENAC SODIUM 75 MG PO TBEC
75.0000 mg | DELAYED_RELEASE_TABLET | Freq: Two times a day (BID) | ORAL | 1 refills | Status: DC
Start: 2023-02-18 — End: 2023-07-22

## 2023-02-18 NOTE — Patient Instructions (Signed)
It was so nice to see you today. Thank you so much for coming in.    You have some wear and tear in your back (arthritis) and I think this may be causing your left leg pain.   I want to get an MRI of your lower back to look into things further. We will get this approved through your insurance and [facility] will call you to schedule the appointment. Make sure you are scheduled for the WIDE BORE MRI.   After you have the MRI, it takes 14-21 days for me to get the results back. Once I have them, we will call you to schedule a follow up visit with me to review them.   Please do not hesitate to call if you have any questions or concerns. You can also message me in MyChart.   Drake Leach PA-C [phone]     The physicians and staff at [facility] at [facility] are committed to providing excellent care. You may receive a survey asking for feedback about your experience at our office. We value you your feedback and appreciate you taking the time to to fill it out. The [facility] leadership team is also available to discuss your experience in person, feel free to contact us [phone].

## 2023-02-22 ENCOUNTER — Telehealth: Payer: Self-pay | Admitting: Orthopedic Surgery

## 2023-02-22 DIAGNOSIS — M5416 Radiculopathy, lumbar region: Secondary | ICD-10-CM

## 2023-02-22 DIAGNOSIS — M47816 Spondylosis without myelopathy or radiculopathy, lumbar region: Secondary | ICD-10-CM

## 2023-02-22 DIAGNOSIS — M51362 Other intervertebral disc degeneration, lumbar region with discogenic back pain and lower extremity pain: Secondary | ICD-10-CM

## 2023-02-22 MED ORDER — DIAZEPAM 5 MG PO TABS
ORAL_TABLET | ORAL | 0 refills | Status: DC
Start: 2023-02-22 — End: 2023-03-30

## 2023-02-22 NOTE — Telephone Encounter (Signed)
I thought she was discharged from PT as her back was better?   Either way, they can try dry needling to see if it helps. Orders done.

## 2023-02-22 NOTE — Telephone Encounter (Signed)
Referral faxed. Patient notified.

## 2023-02-22 NOTE — Telephone Encounter (Signed)
Do you think patient would benefit from dry needling? If so can you send a referral to [facility] in [place].

## 2023-02-22 NOTE — Telephone Encounter (Signed)
Patient left a voicemail requesting something to help with anxiety before her MRI on 02/25/2023. [facility] in [place]

## 2023-02-22 NOTE — Telephone Encounter (Signed)
Valium sent to pharmacy. Please let her know and remind her that she will need a driver.

## 2023-02-25 ENCOUNTER — Ambulatory Visit
Admission: RE | Admit: 2023-02-25 | Discharge: 2023-02-25 | Disposition: A | Discharge status: Home / Self Care | Payer: BC Managed Care – PPO | Source: Ambulatory Visit | Attending: Orthopedic Surgery | Admitting: Orthopedic Surgery

## 2023-02-25 DIAGNOSIS — M47816 Spondylosis without myelopathy or radiculopathy, lumbar region: Secondary | ICD-10-CM | POA: Diagnosis present

## 2023-02-25 DIAGNOSIS — M51362 Other intervertebral disc degeneration, lumbar region with discogenic back pain and lower extremity pain: Secondary | ICD-10-CM

## 2023-02-25 DIAGNOSIS — M5416 Radiculopathy, lumbar region: Secondary | ICD-10-CM | POA: Diagnosis present

## 2023-03-12 ENCOUNTER — Telehealth: Payer: Self-pay | Admitting: Orthopedic Surgery

## 2023-03-12 NOTE — Telephone Encounter (Signed)
Patient is calling to let our office know that her MRI results are in her MyChart and would like to know what the next step is.

## 2023-03-16 IMAGING — MG MM DIGITAL SCREENING BILAT W/ TOMO AND CAD
8 series · 8 of 24 positions shown · non-contrast
Comparison: Previous exam(s).

ACR Breast Density Category a: The breast tissue is almost entirely
fatty.

CLINICAL DATA: Screening.

EXAM:
DIGITAL SCREENING BILATERAL MAMMOGRAM WITH TOMOSYNTHESIS AND CAD
TECHNIQUE: Bilateral screening digital craniocaudal and mediolateral oblique
mammograms were obtained. Bilateral screening digital breast
tomosynthesis was performed. The images were evaluated with
computer-aided detection.

[L MLO synth-2D]
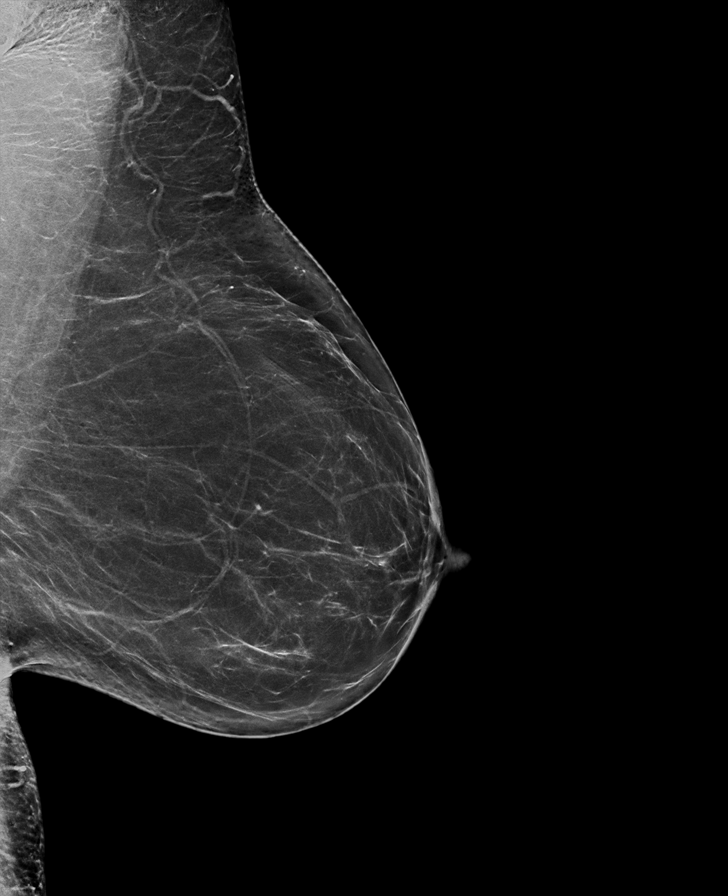

[R CC synth-2D]
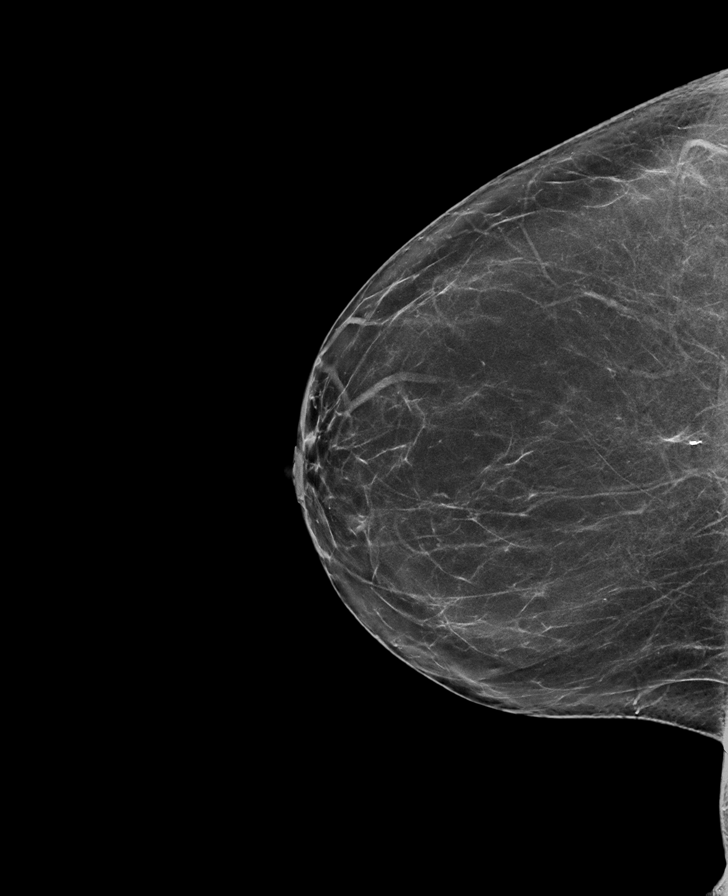

[L CC synth-2D]
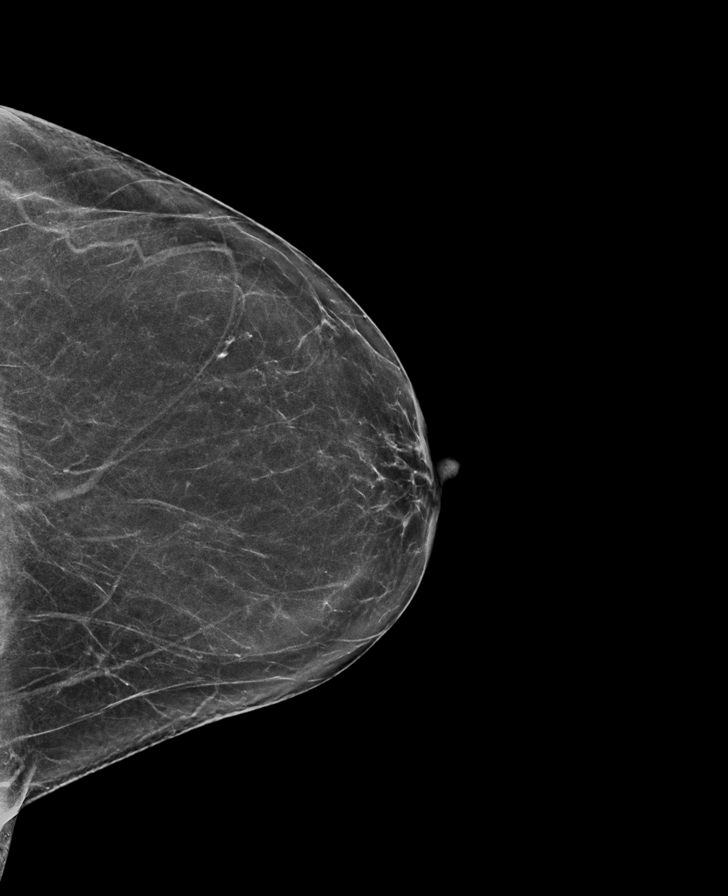

[R MLO synth-2D]
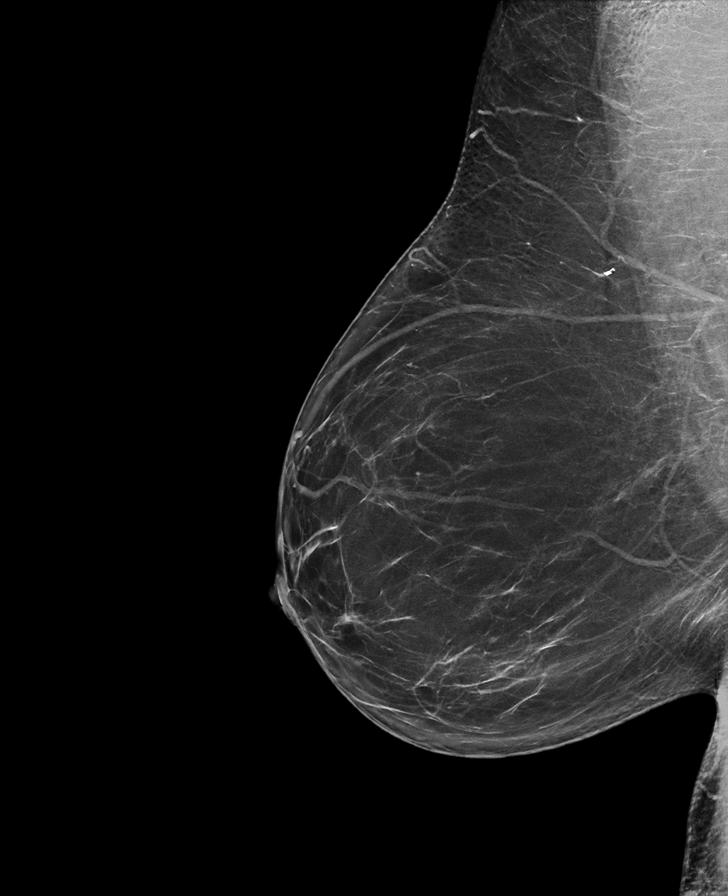

[L CC tomo · tomo slice 33/66.0]
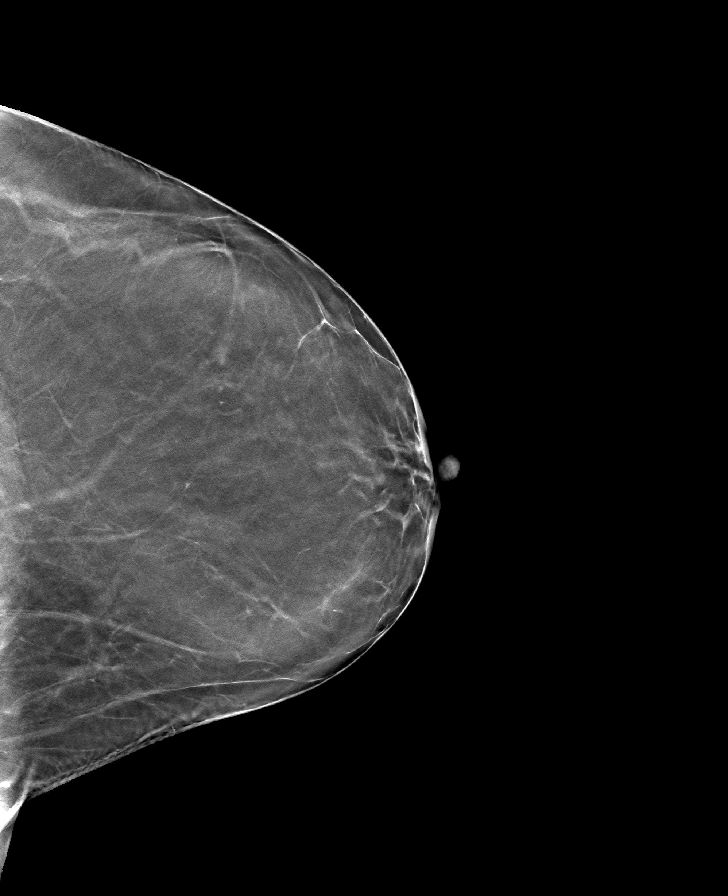

[L MLO tomo · tomo slice 37/74.0]
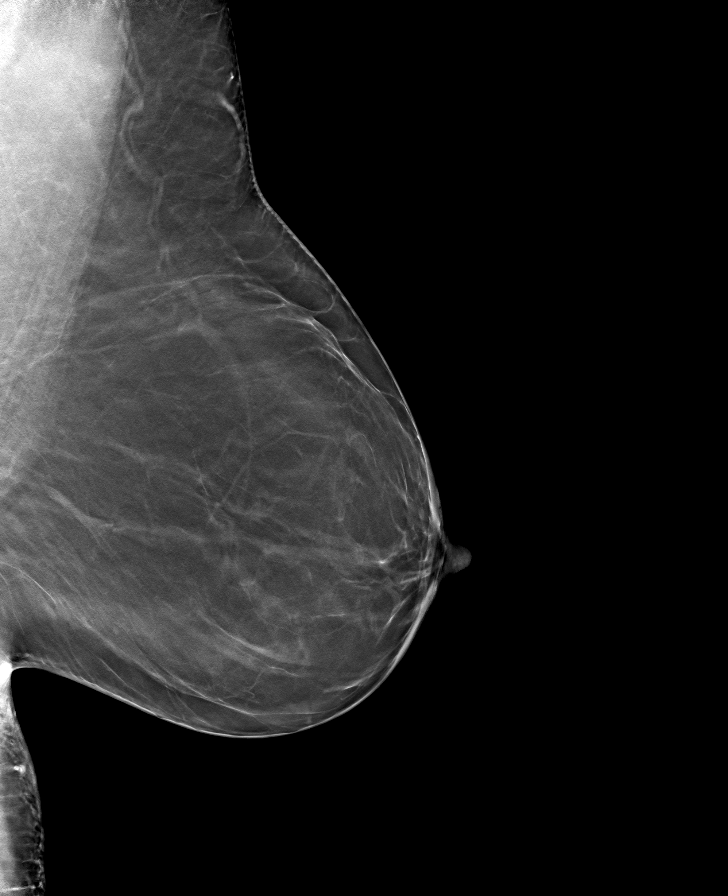

[R CC tomo · tomo slice 35/70.0]
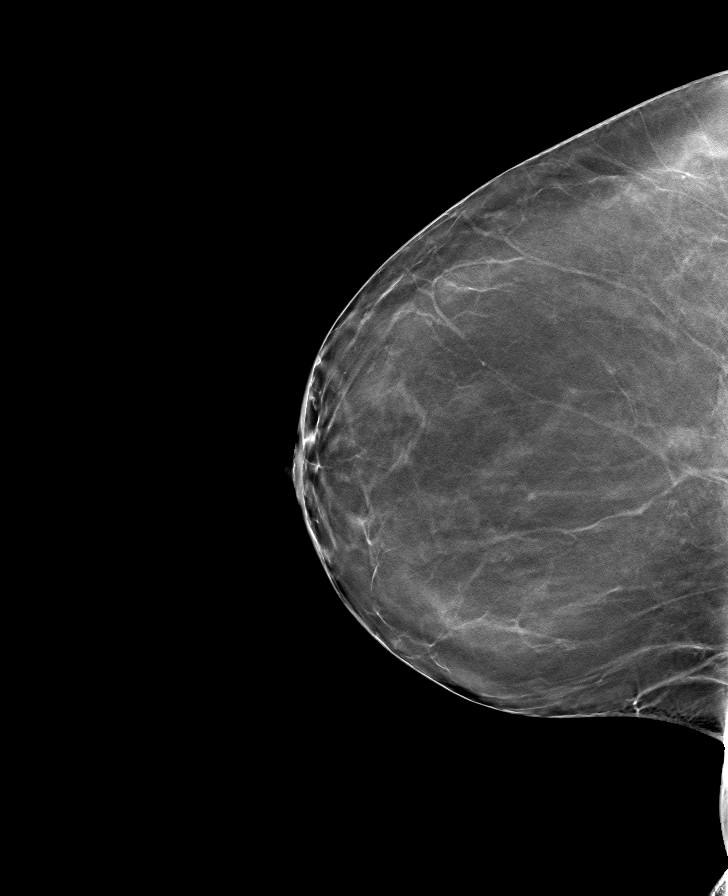

[R MLO tomo · tomo slice 37/74.0]
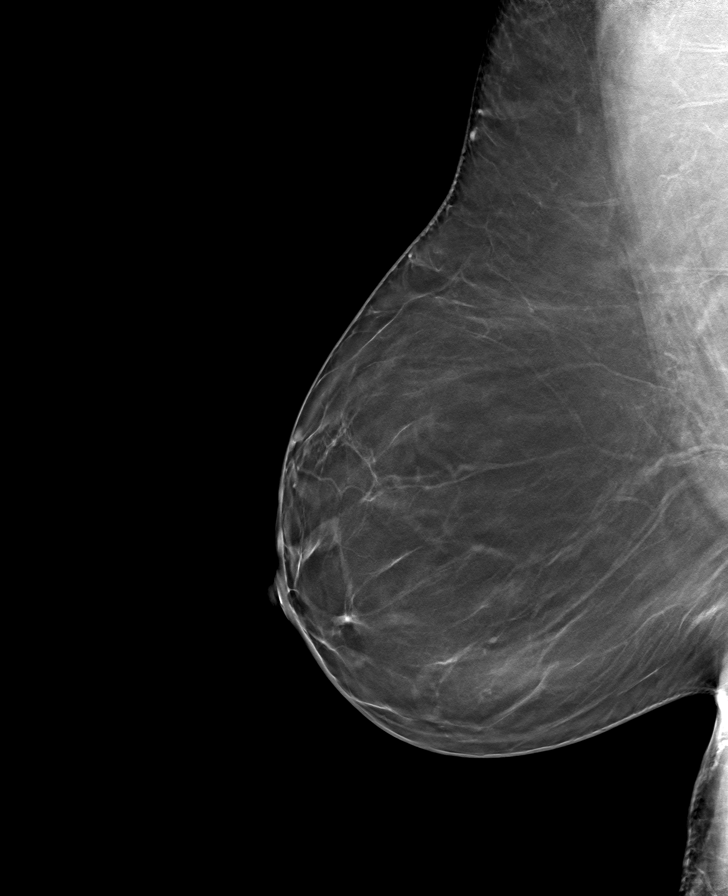

[8 of 24 positions shown; findings below may reference images not displayed]

FINDINGS: There are no findings suspicious for malignancy. The images were
evaluated with computer-aided detection.
IMPRESSION: No mammographic evidence of malignancy. A result letter of this
screening mammogram will be mailed directly to the patient.

RECOMMENDATION:
Screening mammogram in one year. (Code:JP-J-DD5)

BI-RADS CATEGORY  1: Negative.

## 2023-03-24 ENCOUNTER — Telehealth (INDEPENDENT_AMBULATORY_CARE_PROVIDER_SITE_OTHER): Payer: Self-pay

## 2023-03-24 NOTE — Telephone Encounter (Addendum)
 Patient called about having issues with her right leg above her knee. She stated she has a pretty prominent looking veins and wonder if she needs a US done or a visit with Vivia [place].   Please advise

## 2023-03-24 NOTE — Telephone Encounter (Signed)
 Can we find out what issues she is having exactly and how long it has been ongoing

## 2023-03-24 NOTE — Telephone Encounter (Signed)
 Please call patient to schedule an appointment with Vivia Birmingham to discuss sclero therapy.

## 2023-03-24 NOTE — Telephone Encounter (Signed)
 She can take ibuprofen  and utilize compression.  If the tingling and burning is caused from the vein the only thing that will help is to treat it.  However, she has significant lower back issues and the tingling and burning in her thigh, it could be related to nerve issues from her back .  If she wants to come in and discuss sclerotherapy, she can come in and doesn't need an ultrasound.  Following the discussion, we would submit for possible treatment from there.

## 2023-03-26 NOTE — Progress Notes (Signed)
 Referring Physician:  Steva Clotilda DEL, NP [address] [place],  [place] [address]  Primary Physician:  Steva Clotilda DEL, NP  History of Present Illness: [name] has a history of HTN, varicose veins, sleep apnea, hypothyroidism due to Hashimoto's, hyperlipidemia.   Last seen by me on 02/18/23 minimal LBP and more constant left posterior leg pain from butt to her foot. She has known mild lumbar spondylosis with diffuse mild DDD.   She was given voltaren  and lumbar MRI ordered.   She is here to review her MRI.   She has no current LBP, has only minimal soreness. She has only minimal left posterior leg pain that is improving. Numbness has improved. Worse with sitting in hard chairs.   [facility] is in PT [facility] with dry needling. Thinks PT is helping but hated dry needling. She takes prn voltaren .   Conservative measures:  Physical therapy: discharged on 01/21/23 after 5 visits as her back pain was improved- she is back in PT now at [facility] Multimodal medical therapy including regular antiinflammatories: tylenol , motrin   Injections: No epidural steroid injections  Past Surgery: No spine surgery  SUJEY GUNDRY has no symptoms of cervical myelopathy.  The symptoms are causing a significant impact on the patient's life.   Review of Systems:  A 10 point review of systems is negative, except for the pertinent positives and negatives detailed in the HPI.  Past Medical History: Past Medical History:  Diagnosis Date   Anemia    H/O   Anxiety    Arthritis    BRCA negative 08/2018   MyRisk neg except NTHL1 VUS   Complication of anesthesia    Family history of breast cancer 08/2018   IBIS=8.65/riskscore=7.9%   Family history of ovarian cancer    MyRisk neg except NTHL1 VUS   GERD (gastroesophageal reflux disease)    NO MEDS   Hyperlipidemia    Hypertension    Hypothyroidism    Hashimoto's per patient   IFG (impaired fasting glucose)  08/05/2015   Metabolic syndrome 10/13/2016   Palpitations 2012   Evaluated by Timothy Gollan, MD, Holter moniter   PONV (postoperative nausea and vomiting)    Pre-diabetes    Rectal mass    Thyroid  disease    unspecified hypothyroidism    Past Surgical History: Past Surgical History:  Procedure Laterality Date   ANAL FISTULOTOMY N/A 11/30/2014   Procedure: ANAL FISTULOTOMY;  Surgeon: Elgin Laurence III, MD;  Location: [facility];  Service: General;  Laterality: N/A;   BREAST BIOPSY Right 09/08/2018   affirm bx of mass, coil clip, ORGANIZING FAT NECROSIS AND ADJACENT CHANGES SUGGESTIVE OF RUPTURED CYST   BREAST BIOPSY Right 01/13/2021   PREDOMINANTLY BENIGN FIBROADIPOSE TISSUE SCANT FRAGMENTS OF UNREMARKABLE LYMPH NODE   CESAREAN SECTION     COLONOSCOPY  2014   COLONOSCOPY WITH PROPOFOL  N/A 08/03/2022   Procedure: COLONOSCOPY WITH PROPOFOL ;  Surgeon: Onita Elspeth Sharper, DO;  Location: [facility];  Service: Gastroenterology;  Laterality: N/A;   CYSTOSCOPY N/A 05/14/2020   Procedure: CYSTOSCOPY;  Surgeon: Arloa Lamar SQUIBB, MD;  Location: [facility];  Service: Gynecology;  Laterality: N/A;   DILATION AND CURETTAGE OF UTERUS     ENDOMETRIAL ABLATION W/ NOVASURE  05/29/2009   Dr Arloa   left foot surgery     RECTAL EXAM UNDER ANESTHESIA N/A 11/30/2014   Procedure: RECTAL EXAM UNDER ANESTHESIA;  Surgeon: Elgin Laurence III, MD;  Location: [facility];  Service: General;  Laterality: N/A;   TOTAL LAPAROSCOPIC HYSTERECTOMY WITH BILATERAL SALPINGO OOPHORECTOMY Bilateral 05/14/2020   LAPAROSCOPIC SUPRACERVICAL HYSTERECTOMY WITH BILATERAL SALPINGO OOPHORECTOMY;  Surgeon: Arloa Lamar SQUIBB, MD;  Location: [facility];  Service: Gynecology;  Laterality: Bilateral;    Allergies: Allergies as of 03/30/2023 - Review Complete 02/22/2023  Allergen Reaction Noted   Latex Rash 06/05/2022   Amlodipine  10/06/2021   Oxycodone  Nausea And Vomiting 05/06/2021   Rosuvastatin Nausea Only 05/06/2021   Scopolamine  Other (See Comments) 11/22/2014    Medications: Outpatient Encounter Medications as of 03/30/2023  Medication Sig   BD INTEGRA SYRINGE 25G X 1 3 ML MISC USE 1 SYRINGE MONTHLY   Cholecalciferol 125 MCG (5000 UT) capsule Take by mouth.   cyanocobalamin (VITAMIN B12) 1000 MCG/ML injection Inject into the muscle.   diazepam  (VALIUM ) 5 MG tablet 1 po 30 minutes prior to MRI scan. May repeat x 1 right before MRI scan if needed. You will need a driver.   diclofenac  (VOLTAREN ) 75 MG EC tablet Take 1 tablet (75 mg total) by mouth 2 (two) times daily after a meal.   DUPIXENT  300 MG/2ML prefilled syringe Inject into the skin.   levothyroxine  (SYNTHROID ) 112 MCG tablet TAKE ONE TABLET DAILY. EVERY SUNDAY TAKE AN EXTRA ONE-HALF TABLET. (Patient taking differently: Take 112-168 mcg by mouth See admin instructions. Take 1.5 tablets (168 mcg) by mouth on Sundays & take 1 tablet (112 mcg) by mouth on all other days.)   losartan -hydrochlorothiazide (HYZAAR) 100-12.5 MG tablet Take 1 tablet by mouth daily.   spironolactone (ALDACTONE) 25 MG tablet Take 1 tablet by mouth daily.   tacrolimus (PROTOPIC) 0.1 % ointment SMARTSIG:1 Topical Daily   Facility-Administered Encounter Medications as of 03/30/2023  Medication   betamethasone  acetate-betamethasone  sodium phosphate  (CELESTONE ) injection 3 mg    Social History: Social History   Tobacco Use   Smoking status: Never    Passive exposure: Never   Smokeless tobacco: Never  Vaping Use   Vaping status: Never Used  Substance Use Topics   Alcohol use: No    Alcohol/week: 0.0 standard drinks of alcohol   Drug use: No    Family Medical History: Family History  Problem Relation Age of Onset   Hypertension Mother    Heart disease Mother 21       blockages   Alzheimer's disease Mother    Diverticulitis Father    Hernia Father    Depression Father    Alzheimer's disease Father    Hypothyroidism Sister    Uterine cancer Sister 36   Hypothyroidism  Sister    Ovarian cancer Sister    Hypertension Brother    Stroke Maternal Grandfather    Heart attack Maternal Grandfather    Stroke Paternal Grandmother    Hypothyroidism Daughter    Hypothyroidism Daughter    Breast cancer Maternal Aunt 43   Breast cancer Paternal Aunt 80   Ovarian cancer Paternal Aunt    Breast cancer Other 30   Pancreatic cancer Maternal Uncle    Colon cancer Maternal Uncle     Physical Examination: There were no vitals filed for this visit.    Awake, alert, oriented to person, place, and time.  Speech is clear and fluent. Fund of knowledge is appropriate.   Cranial Nerves: Pupils equal round and reactive to light.  Facial tone is symmetric.    No abnormal lesions on exposed skin.   Strength: Side Iliopsoas Quads Hamstring PF DF EHL  R 5 5 5  5 5 5   L 5 5 5 5 5 5    Reflexes are 2+ and symmetric at the patella and achilles.     Clonus is not present.   Bilateral lower extremity sensation is intact to light touch.     She walks with a chronic limp since left foot surgery years ago.   Medical Decision Making  Imaging: Lumbar MRI dated 02/25/23:  FINDINGS: Segmentation:  Standard.   Alignment:  Trace anterolisthesis of L4 on L5   Vertebrae:  No fracture, evidence of discitis, or bone lesion.   Conus medullaris and cauda equina: Conus extends to the L2 level. Conus and cauda equina appear normal.   Paraspinal and other soft tissues: Negative.   Disc levels:   T12-L1: Unremarkable   L1-L2: Unremarkable   L2-L3: Unremarkable   L3-L4: Small central disc protrusion. Mild overall spinal canal narrowing. Bilateral facet degenerative change. Mild bilateral foraminal narrowing.   L4-L5: Mild bilateral facet degenerative change. Minimal disc bulge. No spinal canal narrowing. Mild bilateral neural foraminal narrowing.   L5-S1: Mild bilateral facet degenerative change. Eccentric left disc bulge with an annular fissure. No significant spinal  canal narrowing. No significant neural foraminal narrowing.   IMPRESSION: Mild multilevel degenerative changes of the lumbar spine without evidence of high-grade spinal canal or neural foraminal stenosis.     Electronically Signed   By: Lyndall Gore M.D.   On: 03/09/2023 10:27       I have personally reviewed the images and agree with the above interpretation.   Assessment and Plan: Ms. Haltiwanger has no current LBP, has only minimal soreness. She has only minimal left posterior leg pain that is improving. Numbness has improved.   She has known mild lumbar spondylosis with diffuse mild DDD. She has mild bilateral foraminal stenosis L3-L5. No central or foraminal stenosis seen at L5-S1, but she has disc bulge/facet hypertrophy.   Treatment options discussed with patient and following plan made:   - Continue with PT for lumbar spine.  - Continue on prn voltaren . She takes maybe once a week. Reviewed dosing and side effects. Take with food.  - If symptoms get worse, consider referral for injections.  - Follow up with me in 2 months. Can cancel if doing well.   I spent a total of 20 minutes in face-to-face and non-face-to-face activities related to this patient's care today including review of outside records, review of imaging, review of symptoms, physical exam, discussion of differential diagnosis, discussion of treatment options, and documentation.   Thank you for involving me in the care of this patient.   Glade Boys PA-C Dept. of Neurosurgery

## 2023-03-29 ENCOUNTER — Other Ambulatory Visit: Payer: Self-pay | Admitting: Gerontology

## 2023-03-29 DIAGNOSIS — N644 Mastodynia: Secondary | ICD-10-CM

## 2023-03-29 DIAGNOSIS — M79621 Pain in right upper arm: Secondary | ICD-10-CM

## 2023-03-30 ENCOUNTER — Encounter: Payer: Self-pay | Admitting: Orthopedic Surgery

## 2023-03-30 ENCOUNTER — Ambulatory Visit (INDEPENDENT_AMBULATORY_CARE_PROVIDER_SITE_OTHER): Payer: 59 | Admitting: Orthopedic Surgery

## 2023-03-30 VITALS — BP 132/76 | Ht 67.0 in | Wt 224.0 lb

## 2023-03-30 DIAGNOSIS — M48061 Spinal stenosis, lumbar region without neurogenic claudication: Secondary | ICD-10-CM | POA: Diagnosis not present

## 2023-03-30 DIAGNOSIS — M51361 Other intervertebral disc degeneration, lumbar region with lower extremity pain only: Secondary | ICD-10-CM | POA: Diagnosis not present

## 2023-03-30 DIAGNOSIS — M4726 Other spondylosis with radiculopathy, lumbar region: Secondary | ICD-10-CM | POA: Diagnosis not present

## 2023-03-30 DIAGNOSIS — M51362 Other intervertebral disc degeneration, lumbar region with discogenic back pain and lower extremity pain: Secondary | ICD-10-CM

## 2023-03-30 DIAGNOSIS — M47816 Spondylosis without myelopathy or radiculopathy, lumbar region: Secondary | ICD-10-CM

## 2023-03-30 DIAGNOSIS — M5416 Radiculopathy, lumbar region: Secondary | ICD-10-CM

## 2023-04-05 ENCOUNTER — Ambulatory Visit (INDEPENDENT_AMBULATORY_CARE_PROVIDER_SITE_OTHER): Payer: Self-pay | Admitting: Nurse Practitioner

## 2023-04-06 ENCOUNTER — Ambulatory Visit
Admission: RE | Admit: 2023-04-06 | Discharge: 2023-04-06 | Disposition: A | Discharge status: Home / Self Care | Payer: 59 | Source: Ambulatory Visit | Attending: Gerontology | Admitting: Gerontology

## 2023-04-06 ENCOUNTER — Ambulatory Visit
Admission: RE | Admit: 2023-04-06 | Discharge: 2023-04-06 | Disposition: A | Discharge status: Home / Self Care | Payer: 59 | Source: Ambulatory Visit | Attending: Gerontology

## 2023-04-06 DIAGNOSIS — M79621 Pain in right upper arm: Secondary | ICD-10-CM | POA: Insufficient documentation

## 2023-04-06 DIAGNOSIS — N644 Mastodynia: Secondary | ICD-10-CM | POA: Diagnosis present

## 2023-04-09 NOTE — Progress Notes (Signed)
[facility] [address] [place], [place] [address]  Pulmonary Sleep Medicine   Office Visit Note  Patient Name: Desiree Walker DOB: 1960/12/13 MRN [id number]    Chief Complaint: Obstructive Sleep Apnea visit  Brief History:  Tehya is seen today for an initial sleep evaluation on APAP 5-20 cmh20.  The patient has a 9 month history of sleep apnea. Prior to CPAP use patient reports  snoring,    Patient is using PAP nightly.  The patient feels rested after sleeping with PAP.  The patient reports benefiting from PAP use. Reported sleepiness is  improved and the Epworth Sleepiness Score is 2 out of 24. The patient will seldom take naps. The patient complains of the following: none.  The compliance download shows 90%  compliance with an average use time of 7 hours. The AHI is 0.5  The patient does complain of limb movements disrupting sleep. The patient continues to require PAP therapy in order to eliminate sleep apnea.   ROS  General: (-) fever, (-) chills, (-) night sweat Nose and Sinuses: (-) nasal stuffiness or itchiness, (-) postnasal drip, (-) nosebleeds, (-) sinus trouble. Mouth and Throat: (-) sore throat, (-) hoarseness. Neck: (-) swollen glands, (-) enlarged thyroid, (-) neck pain. Respiratory: - cough, - shortness of breath, - wheezing. Neurologic: - numbness, - tingling. Psychiatric: + anxiety, - depression   Current Medication: Outpatient Encounter Medications as of 04/12/2023  Medication Sig   atorvastatin (LIPITOR) 10 MG tablet Take 10 mg by mouth daily.   BD INTEGRA SYRINGE 25G X 1" 3 ML MISC USE 1 SYRINGE MONTHLY   Cholecalciferol 125 MCG (5000 UT) capsule Take by mouth.   cyanocobalamin (VITAMIN B12) 1000 MCG/ML injection Inject into the muscle.   diclofenac (VOLTAREN) 75 MG EC tablet Take 1 tablet (75 mg total) by mouth 2 (two) times daily after a meal.   DUPIXENT 300 MG/2ML prefilled syringe Inject into the skin.   estradiol (ESTRACE) 0.5 MG tablet  Take 0.5 mg by mouth daily.   levothyroxine (SYNTHROID) 112 MCG tablet TAKE ONE TABLET DAILY. EVERY SUNDAY TAKE AN EXTRA ONE-HALF TABLET. (Patient taking differently: Take 112-168 mcg by mouth See admin instructions. Take 1.5 tablets (168 mcg) by mouth on Sundays & take 1 tablet (112 mcg) by mouth on all other days.)   losartan-hydrochlorothiazide (HYZAAR) 100-12.5 MG tablet Take 1 tablet by mouth daily.   spironolactone (ALDACTONE) 25 MG tablet Take 1 tablet by mouth daily.   tacrolimus (PROTOPIC) 0.1 % ointment SMARTSIG:1 Topical Daily   [DISCONTINUED] escitalopram (LEXAPRO) 5 MG tablet Take by mouth.   Facility-Administered Encounter Medications as of 04/12/2023  Medication   betamethasone acetate-betamethasone sodium phosphate (CELESTONE) injection 3 mg    Surgical History: Past Surgical History:  Procedure Laterality Date   ANAL FISTULOTOMY N/A 11/30/2014   Procedure: ANAL FISTULOTOMY;  Surgeon: Tiney Rouge III, MD;  Location: [facility] ORS;  Service: General;  Laterality: N/A;   BREAST BIOPSY Right 09/08/2018   affirm bx of mass, coil clip, ORGANIZING FAT NECROSIS AND ADJACENT CHANGES SUGGESTIVE OF RUPTURED CYST   BREAST BIOPSY Right 01/13/2021   PREDOMINANTLY BENIGN FIBROADIPOSE TISSUE SCANT FRAGMENTS OF UNREMARKABLE LYMPH NODE   CESAREAN SECTION     COLONOSCOPY  2014   COLONOSCOPY WITH PROPOFOL N/A 08/03/2022   Procedure: COLONOSCOPY WITH PROPOFOL;  Surgeon: Jaynie Collins, DO;  Location: [facility] ENDOSCOPY;  Service: Gastroenterology;  Laterality: N/A;   CYSTOSCOPY N/A 05/14/2020   Procedure: CYSTOSCOPY;  Surgeon: Nadara Mustard, MD;  Location: [facility] ORS;  Service: Gynecology;  Laterality: N/A;   DILATION AND CURETTAGE OF UTERUS     ENDOMETRIAL ABLATION W/ NOVASURE  05/29/2009   Dr Tiburcio Pea   left foot surgery     RECTAL EXAM UNDER ANESTHESIA N/A 11/30/2014   Procedure: RECTAL EXAM UNDER ANESTHESIA;  Surgeon: Tiney Rouge III, MD;  Location: [facility] ORS;  Service: General;  Laterality:  N/A;   TOTAL LAPAROSCOPIC HYSTERECTOMY WITH BILATERAL SALPINGO OOPHORECTOMY Bilateral 05/14/2020   LAPAROSCOPIC SUPRACERVICAL HYSTERECTOMY WITH BILATERAL SALPINGO OOPHORECTOMY;  Surgeon: Nadara Mustard, MD;  Location: [facility] ORS;  Service: Gynecology;  Laterality: Bilateral;    Medical History: Past Medical History:  Diagnosis Date   Anemia    H/O   Anxiety    Arthritis    BRCA negative 08/2018   MyRisk neg except NTHL1 VUS   Complication of anesthesia    Family history of breast cancer 08/2018   IBIS=8.65/riskscore=7.9%   Family history of ovarian cancer    MyRisk neg except NTHL1 VUS   GERD (gastroesophageal reflux disease)    NO MEDS   Hyperlipidemia    Hypertension    Hypothyroidism    Hashimoto's per patient   IFG (impaired fasting glucose) 08/05/2015   Metabolic syndrome 10/13/2016   Palpitations 2012   Evaluated by Julien Nordmann, MD, Holter moniter   PONV (postoperative nausea and vomiting)    Pre-diabetes    Rectal mass    Thyroid disease    unspecified hypothyroidism    Family History: Non contributory to the present illness  Social History: Social History   Socioeconomic History   Marital status: Married    Spouse name: Desiree Walker   Number of children: 3   Years of education: Not on file   Highest education level: Associate degree: occupational, Scientist, product/process development, or vocational program  Occupational History   Occupation: Programmer, systems Pediatrics  Tobacco Use   Smoking status: Never    Passive exposure: Never   Smokeless tobacco: Never  Vaping Use   Vaping status: Never Used  Substance and Sexual Activity   Alcohol use: No    Alcohol/week: 0.0 standard drinks of alcohol   Drug use: No   Sexual activity: Yes    Birth control/protection: Post-menopausal  Other Topics Concern   Not on file  Social History Narrative   Not on file   Social Drivers of Health   Financial Resource Strain: Low Risk  (09/21/2017)   Overall Financial Resource Strain  (CARDIA)    Difficulty of Paying Living Expenses: Not hard at all  Food Insecurity: No Food Insecurity (09/21/2017)   Hunger Vital Sign    Worried About Running Out of Food in the Last Year: Never true    Ran Out of Food in the Last Year: Never true  Transportation Needs: No Transportation Needs (09/21/2017)   PRAPARE - Administrator, Civil Service (Medical): No    Lack of Transportation (Non-Medical): No  Physical Activity: Inactive (09/21/2017)   Exercise Vital Sign    Days of Exercise per Week: 0 days    Minutes of Exercise per Session: 0 min  Stress: No Stress Concern Present (09/21/2017)   [employer] of Occupational Health - Occupational Stress Questionnaire    Feeling of Stress : Not at all  Social Connections: Unknown (12/18/2022)   Received from [facility]   Social Network    Social Network: Not on file  Intimate Partner Violence: Unknown (12/18/2022)   Received from [facility]  HITS    Physically Hurt: Not on file    Insult or Talk Down To: Not on file    Threaten Physical Harm: Not on file    Scream or Curse: Not on file    Vital Signs: Blood pressure 123/82, pulse 94, resp. rate 16, height 5\' 7"  (1.702 m), weight 225 lb (102.1 kg), SpO2 98%. Body mass index is 35.24 kg/m.    Examination: General Appearance: The patient is well-developed, well-nourished, and in no distress. Neck Circumference: 43 cm Skin: Gross inspection of skin unremarkable. Head: normocephalic, no gross deformities. Eyes: no gross deformities noted. ENT: ears appear grossly normal Neurologic: Alert and oriented. No involuntary movements.  STOP BANG RISK ASSESSMENT S (snore) Have you been told that you snore?     NO   T (tired) Are you often tired, fatigued, or sleepy during the day?   NO  O (obstruction) Do you stop breathing, choke, or gasp during sleep? NO   P (pressure) Do you have or are you being treated for high blood pressure? YES   B (BMI) Is your body index  greater than 35 kg/m? YES   A (age) Are you 24 years old or older? YES   N (neck) Do you have a neck circumference greater than 16 inches?   YES   G (gender) Are you a female? NO   TOTAL STOP/BANG "YES" ANSWERS 4       A STOP-Bang score of 2 or less is considered low risk, and a score of 5 or more is high risk for having either moderate or severe OSA. For people who score 3 or 4, doctors may need to perform further assessment to determine how likely they are to have OSA.         EPWORTH SLEEPINESS SCALE:  Scale:  (0)= no chance of dozing; (1)= slight chance of dozing; (2)= moderate chance of dozing; (3)= high chance of dozing  Chance  Situtation    Sitting and reading: 0    Watching TV: 0    Sitting Inactive in public: 0    As a passenger in car: 0      Lying down to rest: 2    Sitting and talking: 0    Sitting quielty after lunch: 0    In a car, stopped in traffic: 0   TOTAL SCORE:   2 out of 24    SLEEP STUDIES:     HST- (06/29/2022) AHI 11/hr, min SP02 82.4%   CPAP COMPLIANCE DATA:  Date Range: 02/07/2023-04/07/2023  Average Daily Use: 7 hours  Median Use: 7 hrs   Compliance for > 4 Hours: 90% days  AHI: 0.5 respiratory events per hour  Days Used: 60/60  Mask Leak: 7.4  95th Percentile Pressure: 8.0         LABS: No results found for this or any previous visit (from the past 2160 hours).  Radiology: MM 3D DIAGNOSTIC MAMMOGRAM BILATERAL BREAST Result Date: 04/06/2023 CLINICAL DATA:  RIGHT axillary pain EXAM: DIGITAL DIAGNOSTIC BILATERAL MAMMOGRAM WITH TOMOSYNTHESIS AND CAD; ULTRASOUND RIGHT BREAST LIMITED TECHNIQUE: Bilateral digital diagnostic mammography and breast tomosynthesis was performed. The images were evaluated with computer-aided detection. ; Targeted ultrasound examination of the right breast was performed COMPARISON:  Previous exam(s). ACR Breast Density Category a: The breasts are almost entirely fatty. FINDINGS: Diagnostic  mammographic images were obtained over the area of pain in the RIGHT breast. No suspicious mammographic finding is identified in this area. No suspicious mass, microcalcification,  or other finding is identified in either breast. No suspicious mammographic etiology for breast pain identified. On physical exam, no suspicious mass is appreciated. Targeted ultrasound was performed of the site of pain in the RIGHT axilla. Multiple RIGHT axillary lymph nodes are noted in this area without a distinguishable cortex/hilar interface which could be due to near complete fatty replacement versus complete cortical replacement. This is symmetric on both sides. Patient was brought back for repeat imaging with a different ultrasound machine. This confirms near complete fatty replacement of the axillary lymph nodes with a thin smooth cortex measuring less than 1 mm thickness and otherwise normal morphologic appearance of the axillary lymph nodes. No suspicious cystic or solid mass is seen at the sites of painful concern in the RIGHT axilla. IMPRESSION: 1. No mammographic or sonographic evidence of malignancy at the site of painful concern in the RIGHT axilla. Any further workup of the patient's symptoms should be based on the clinical assessment. Recommend routine annual screening mammogram in 1 year. 2. No mammographic evidence of malignancy bilaterally. 3. Breast pain is a common condition, which will often resolve on its own without intervention. It can be affected by hormonal changes, medication side effect, weight changes and fit of the bra. Pain may also be referred from other adjacent areas of the body. Breast pain may be improved by wearing adequate well-fitting support, over-the-counter topical and oral NSAID medication, low-fat diet, and ice/heat as needed. Studies have shown an improvement in cyclic pain with use of evening primrose oil, vitamin D and vitamin E. Clinical follow-up recommended to discuss any further work-up  recommendations and appropriate treatment. RECOMMENDATION: Screening mammogram in one year.(Code:SM-B-01Y) I have discussed the findings and recommendations with the patient. If applicable, a reminder letter will be sent to the patient regarding the next appointment. BI-RADS CATEGORY  2: Benign. Electronically Signed   By: Meda Klinefelter M.D.   On: 04/06/2023 13:09   [place] LIMITED ULTRASOUND INCLUDING AXILLA RIGHT BREAST Result Date: 04/06/2023 CLINICAL DATA:  RIGHT axillary pain EXAM: DIGITAL DIAGNOSTIC BILATERAL MAMMOGRAM WITH TOMOSYNTHESIS AND CAD; ULTRASOUND RIGHT BREAST LIMITED TECHNIQUE: Bilateral digital diagnostic mammography and breast tomosynthesis was performed. The images were evaluated with computer-aided detection. ; Targeted ultrasound examination of the right breast was performed COMPARISON:  Previous exam(s). ACR Breast Density Category a: The breasts are almost entirely fatty. FINDINGS: Diagnostic mammographic images were obtained over the area of pain in the RIGHT breast. No suspicious mammographic finding is identified in this area. No suspicious mass, microcalcification, or other finding is identified in either breast. No suspicious mammographic etiology for breast pain identified. On physical exam, no suspicious mass is appreciated. Targeted ultrasound was performed of the site of pain in the RIGHT axilla. Multiple RIGHT axillary lymph nodes are noted in this area without a distinguishable cortex/hilar interface which could be due to near complete fatty replacement versus complete cortical replacement. This is symmetric on both sides. Patient was brought back for repeat imaging with a different ultrasound machine. This confirms near complete fatty replacement of the axillary lymph nodes with a thin smooth cortex measuring less than 1 mm thickness and otherwise normal morphologic appearance of the axillary lymph nodes. No suspicious cystic or solid mass is seen at the sites of painful  concern in the RIGHT axilla. IMPRESSION: 1. No mammographic or sonographic evidence of malignancy at the site of painful concern in the RIGHT axilla. Any further workup of the patient's symptoms should be based on the clinical assessment. Recommend routine  annual screening mammogram in 1 year. 2. No mammographic evidence of malignancy bilaterally. 3. Breast pain is a common condition, which will often resolve on its own without intervention. It can be affected by hormonal changes, medication side effect, weight changes and fit of the bra. Pain may also be referred from other adjacent areas of the body. Breast pain may be improved by wearing adequate well-fitting support, over-the-counter topical and oral NSAID medication, low-fat diet, and ice/heat as needed. Studies have shown an improvement in cyclic pain with use of evening primrose oil, vitamin D and vitamin E. Clinical follow-up recommended to discuss any further work-up recommendations and appropriate treatment. RECOMMENDATION: Screening mammogram in one year.(Code:SM-B-01Y) I have discussed the findings and recommendations with the patient. If applicable, a reminder letter will be sent to the patient regarding the next appointment. BI-RADS CATEGORY  2: Benign. Electronically Signed   By: Meda Klinefelter M.D.   On: 04/06/2023 13:09    No results found.   MM 3D DIAGNOSTIC MAMMOGRAM BILATERAL BREAST Result Date: 04/06/2023 CLINICAL DATA:  RIGHT axillary pain EXAM: DIGITAL DIAGNOSTIC BILATERAL MAMMOGRAM WITH TOMOSYNTHESIS AND CAD; ULTRASOUND RIGHT BREAST LIMITED TECHNIQUE: Bilateral digital diagnostic mammography and breast tomosynthesis was performed. The images were evaluated with computer-aided detection. ; Targeted ultrasound examination of the right breast was performed COMPARISON:  Previous exam(s). ACR Breast Density Category a: The breasts are almost entirely fatty. FINDINGS: Diagnostic mammographic images were obtained over the area of pain in  the RIGHT breast. No suspicious mammographic finding is identified in this area. No suspicious mass, microcalcification, or other finding is identified in either breast. No suspicious mammographic etiology for breast pain identified. On physical exam, no suspicious mass is appreciated. Targeted ultrasound was performed of the site of pain in the RIGHT axilla. Multiple RIGHT axillary lymph nodes are noted in this area without a distinguishable cortex/hilar interface which could be due to near complete fatty replacement versus complete cortical replacement. This is symmetric on both sides. Patient was brought back for repeat imaging with a different ultrasound machine. This confirms near complete fatty replacement of the axillary lymph nodes with a thin smooth cortex measuring less than 1 mm thickness and otherwise normal morphologic appearance of the axillary lymph nodes. No suspicious cystic or solid mass is seen at the sites of painful concern in the RIGHT axilla. IMPRESSION: 1. No mammographic or sonographic evidence of malignancy at the site of painful concern in the RIGHT axilla. Any further workup of the patient's symptoms should be based on the clinical assessment. Recommend routine annual screening mammogram in 1 year. 2. No mammographic evidence of malignancy bilaterally. 3. Breast pain is a common condition, which will often resolve on its own without intervention. It can be affected by hormonal changes, medication side effect, weight changes and fit of the bra. Pain may also be referred from other adjacent areas of the body. Breast pain may be improved by wearing adequate well-fitting support, over-the-counter topical and oral NSAID medication, low-fat diet, and ice/heat as needed. Studies have shown an improvement in cyclic pain with use of evening primrose oil, vitamin D and vitamin E. Clinical follow-up recommended to discuss any further work-up recommendations and appropriate treatment. RECOMMENDATION:  Screening mammogram in one year.(Code:[id number]) I have discussed the findings and recommendations with the patient. If applicable, a reminder letter will be sent to the patient regarding the next appointment. BI-RADS CATEGORY  2: Benign. Electronically Signed   By: Meda Klinefelter M.D.   On: 04/06/2023 13:09  [place] LIMITED ULTRASOUND INCLUDING AXILLA RIGHT BREAST Result Date: 04/06/2023 CLINICAL DATA:  RIGHT axillary pain EXAM: DIGITAL DIAGNOSTIC BILATERAL MAMMOGRAM WITH TOMOSYNTHESIS AND CAD; ULTRASOUND RIGHT BREAST LIMITED TECHNIQUE: Bilateral digital diagnostic mammography and breast tomosynthesis was performed. The images were evaluated with computer-aided detection. ; Targeted ultrasound examination of the right breast was performed COMPARISON:  Previous exam(s). ACR Breast Density Category a: The breasts are almost entirely fatty. FINDINGS: Diagnostic mammographic images were obtained over the area of pain in the RIGHT breast. No suspicious mammographic finding is identified in this area. No suspicious mass, microcalcification, or other finding is identified in either breast. No suspicious mammographic etiology for breast pain identified. On physical exam, no suspicious mass is appreciated. Targeted ultrasound was performed of the site of pain in the RIGHT axilla. Multiple RIGHT axillary lymph nodes are noted in this area without a distinguishable cortex/hilar interface which could be due to near complete fatty replacement versus complete cortical replacement. This is symmetric on both sides. Patient was brought back for repeat imaging with a different ultrasound machine. This confirms near complete fatty replacement of the axillary lymph nodes with a thin smooth cortex measuring less than 1 mm thickness and otherwise normal morphologic appearance of the axillary lymph nodes. No suspicious cystic or solid mass is seen at the sites of painful concern in the RIGHT axilla. IMPRESSION: 1. No mammographic or  sonographic evidence of malignancy at the site of painful concern in the RIGHT axilla. Any further workup of the patient's symptoms should be based on the clinical assessment. Recommend routine annual screening mammogram in 1 year. 2. No mammographic evidence of malignancy bilaterally. 3. Breast pain is a common condition, which will often resolve on its own without intervention. It can be affected by hormonal changes, medication side effect, weight changes and fit of the bra. Pain may also be referred from other adjacent areas of the body. Breast pain may be improved by wearing adequate well-fitting support, over-the-counter topical and oral NSAID medication, low-fat diet, and ice/heat as needed. Studies have shown an improvement in cyclic pain with use of evening primrose oil, vitamin D and vitamin E. Clinical follow-up recommended to discuss any further work-up recommendations and appropriate treatment. RECOMMENDATION: Screening mammogram in one year.(Code:[id number]) I have discussed the findings and recommendations with the patient. If applicable, a reminder letter will be sent to the patient regarding the next appointment. BI-RADS CATEGORY  2: Benign. Electronically Signed   By: Meda Klinefelter M.D.   On: 04/06/2023 13:09      Assessment and Plan: Patient Active Problem List   Diagnosis Date Noted   OSA (obstructive sleep apnea) 04/12/2023   CPAP use counseling 04/12/2023   Varicose veins of leg with pain, right 03/27/2022   Hypothyroidism due to Hashimoto's thyroiditis 06/12/2020   IGT (impaired glucose tolerance) 06/12/2020   Irritant contact dermatitis 06/12/2020   S/P laparoscopic supracervical hysterectomy 05/27/2020   Fibroid uterus 05/14/2020   Rotator cuff tendinitis, right 11/06/2019   Traumatic complete tear of right rotator cuff 11/06/2019   COVID-19 virus infection 03/14/2019   S/P breast biopsy, right 01/12/2019   Breast pain, right 01/12/2019   Family history of breast  cancer 06/02/2018   Family history of ovarian cancer 06/02/2018   Eczema 06/10/2017   History of uterine fibroid 10/23/2016   Metabolic syndrome 10/13/2016   Medication monitoring encounter 10/13/2016   Pressure in chest 12/17/2015   Anxiety disorder 12/17/2015   Obesity 12/17/2015   Sleep apnea 08/29/2015   Snoring  08/27/2015   Vitamin D deficiency 08/19/2015   Leg cramps 08/05/2015   IFG (impaired fasting glucose) 08/05/2015   Anal fistula    Other specified symptoms and signs involving the digestive system and abdomen 10/31/2014   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 OSA (obstructive sleep apnea) (Primary)  The patient does tolerate PAP and reports  benefit from PAP use. The patient was reminded how to clean equipment and advised to replace supplies routinely. The patient was also counselled on weight loss. The compliance is good. The AHI is 0.5.   OSA on cpap- controlled. Continue with excellent compliance with pap. CPAP continues to be medically necessary to treat this patient's OSA. F/u 58m 2. CPAP use counseling CPAP Counseling: had a lengthy discussion with the patient regarding the importance of PAP therapy in management of the sleep apnea. Patient appears to understand the risk factor reduction and also understands the risks associated with untreated sleep apnea. Patient will try to make a good faith effort to remain compliant with therapy. Also instructed the patient on  proper cleaning of the device including the water must be changed daily if possible and use of distilled water is preferred. Patient understands that the machine should be regularly cleaned with appropriate recommended cleaning solutions that do not damage the PAP machine for example given white vinegar and water rinses. Other methods such as ozone treatment may not be as good as these simple methods to achieve cleaning.    General Counseling: I have discussed the findings of the evaluation and examination with Aggie Cosier.  I have also discussed any further diagnostic evaluation thatmay be needed or ordered today. Alanys verbalizes understanding of the findings of todays visit. We also reviewed her medications today and discussed drug interactions and side effects including but not limited excessive drowsiness and altered mental states. We also discussed that there is always a risk not just to her but also people around her. she has been encouraged to call the office with any questions or concerns that should arise related to todays visit.  No orders of the defined types were placed in this encounter.       I have personally obtained a history, examined the patient, evaluated laboratory and imaging results, formulated the assessment and plan and placed orders. This patient was seen today by Emmaline Kluver, PA-C in collaboration with Dr. Freda Munro.   Yevonne Pax, MD [facility] Diplomate ABMS Pulmonary Critical Care Medicine and Sleep Medicine

## 2023-04-12 ENCOUNTER — Ambulatory Visit (INDEPENDENT_AMBULATORY_CARE_PROVIDER_SITE_OTHER): Payer: 59 | Admitting: Internal Medicine

## 2023-04-12 VITALS — BP 123/82 | HR 94 | Resp 16 | Ht 67.0 in | Wt 225.0 lb

## 2023-04-12 DIAGNOSIS — G4733 Obstructive sleep apnea (adult) (pediatric): Secondary | ICD-10-CM

## 2023-04-12 DIAGNOSIS — Z7189 Other specified counseling: Secondary | ICD-10-CM | POA: Diagnosis not present

## 2023-04-12 NOTE — Patient Instructions (Signed)

## 2023-04-14 ENCOUNTER — Telehealth (INDEPENDENT_AMBULATORY_CARE_PROVIDER_SITE_OTHER): Payer: Self-pay

## 2023-04-14 NOTE — Telephone Encounter (Addendum)
Patient left a message stating that she has been having ongoing pain with the right leg. She stated that the veins are very prominent. Patient has appointment schedule for 04/19/23 but is requesting to be seen sooner.  I spoke with Vivia Birmingham NP and she advise unfortunately due to schedule availability we will not be able to move up appointment. The patient can take ibuprofen or tylenol for pain relief. If pain becomes unbearable before appointment we do recommend to go to urgent care or ED for further evaluation.I left a detail message on the patient voicemail.

## 2023-04-19 ENCOUNTER — Ambulatory Visit (INDEPENDENT_AMBULATORY_CARE_PROVIDER_SITE_OTHER): Payer: 59 | Admitting: Nurse Practitioner

## 2023-04-19 ENCOUNTER — Encounter (INDEPENDENT_AMBULATORY_CARE_PROVIDER_SITE_OTHER): Payer: Self-pay | Admitting: Nurse Practitioner

## 2023-04-19 VITALS — BP 136/80 | HR 101 | Resp 16 | Wt 223.6 lb

## 2023-04-19 DIAGNOSIS — I83811 Varicose veins of right lower extremities with pain: Secondary | ICD-10-CM

## 2023-04-19 DIAGNOSIS — I890 Lymphedema, not elsewhere classified: Secondary | ICD-10-CM | POA: Diagnosis not present

## 2023-04-19 NOTE — Progress Notes (Signed)
Subjective:    Patient ID: Desiree Walker, female    DOB: 02-Nov-1960, 63 y.o.   MRN: [id number] Chief Complaint  Patient presents with   Follow-up    Discuss sclerotherapy    The patient is a 63 year old female who returns today for follow-up evaluation of her varicose veins.  She previously underwent endovenous laser ablation of her right lower extremity.  She was having significant pain and discomfort in the right lower extremity prior to intervention and notes that that has improved.  She is concerned because she has noticed some more palpable varicosities on her thigh.  These are not tender they are just more noticeable.  They sometimes come and go.  She is concerned that this may have been caused by the endovenous laser ablation.  She also continues to have swelling post ablation.    Review of Systems  Cardiovascular:  Positive for leg swelling.  All other systems reviewed and are negative.      Objective:   Physical Exam Vitals reviewed.  HENT:     Head: Normocephalic.  Cardiovascular:     Rate and Rhythm: Normal rate.  Pulmonary:     Effort: Pulmonary effort is normal.  Musculoskeletal:     Right lower leg: Edema present.     Left lower leg: Edema present.  Skin:    General: Skin is warm and dry.  Neurological:     Mental Status: She is alert and oriented to person, place, and time.  Psychiatric:        Mood and Affect: Mood normal.        Behavior: Behavior normal.        Thought Content: Thought content normal.        Judgment: Judgment normal.     BP 136/80   Pulse (!) 101   Resp 16   Wt 223 lb 9.6 oz (101.4 kg)   BMI 35.02 kg/m   Past Medical History:  Diagnosis Date   Anemia    H/O   Anxiety    Arthritis    BRCA negative 08/2018   MyRisk neg except NTHL1 VUS   Complication of anesthesia    Family history of breast cancer 08/2018   IBIS=8.65/riskscore=7.9%   Family history of ovarian cancer    MyRisk neg except NTHL1 VUS   GERD  (gastroesophageal reflux disease)    NO MEDS   Hyperlipidemia    Hypertension    Hypothyroidism    Hashimoto's per patient   IFG (impaired fasting glucose) 08/05/2015   Metabolic syndrome 10/13/2016   Palpitations 2012   Evaluated by Julien Nordmann, MD, Holter moniter   PONV (postoperative nausea and vomiting)    Pre-diabetes    Rectal mass    Thyroid disease    unspecified hypothyroidism    Social History   Socioeconomic History   Marital status: Married    Spouse name: Molly Maduro A. Carreon   Number of children: 3   Years of education: Not on file   Highest education level: Associate degree: occupational, Scientist, product/process development, or vocational program  Occupational History   Occupation: Programmer, systems Pediatrics  Tobacco Use   Smoking status: Never    Passive exposure: Never   Smokeless tobacco: Never  Vaping Use   Vaping status: Never Used  Substance and Sexual Activity   Alcohol use: No    Alcohol/week: 0.0 standard drinks of alcohol   Drug use: No   Sexual activity: Yes    Birth control/protection: Post-menopausal  Other  Topics Concern   Not on file  Social History Narrative   Not on file   Social Drivers of Health   Financial Resource Strain: Low Risk  (09/21/2017)   Overall Financial Resource Strain (CARDIA)    Difficulty of Paying Living Expenses: Not hard at all  Food Insecurity: No Food Insecurity (09/21/2017)   Hunger Vital Sign    Worried About Running Out of Food in the Last Year: Never true    Ran Out of Food in the Last Year: Never true  Transportation Needs: No Transportation Needs (09/21/2017)   PRAPARE - Administrator, Civil Service (Medical): No    Lack of Transportation (Non-Medical): No  Physical Activity: Inactive (09/21/2017)   Exercise Vital Sign    Days of Exercise per Week: 0 days    Minutes of Exercise per Session: 0 min  Stress: No Stress Concern Present (09/21/2017)   [employer] of Occupational Health - Occupational Stress Questionnaire     Feeling of Stress : Not at all  Social Connections: Unknown (12/18/2022)   Received from [facility]   Social Network    Social Network: Not on file  Intimate Partner Violence: Unknown (12/18/2022)   Received from [facility]   HITS    Physically Hurt: Not on file    Insult or Talk Down To: Not on file    Threaten Physical Harm: Not on file    Scream or Curse: Not on file    Past Surgical History:  Procedure Laterality Date   ANAL FISTULOTOMY N/A 11/30/2014   Procedure: ANAL FISTULOTOMY;  Surgeon: Tiney Rouge III, MD;  Location: [facility] ORS;  Service: General;  Laterality: N/A;   BREAST BIOPSY Right 09/08/2018   affirm bx of mass, coil clip, ORGANIZING FAT NECROSIS AND ADJACENT CHANGES SUGGESTIVE OF RUPTURED CYST   BREAST BIOPSY Right 01/13/2021   PREDOMINANTLY BENIGN FIBROADIPOSE TISSUE SCANT FRAGMENTS OF UNREMARKABLE LYMPH NODE   CESAREAN SECTION     COLONOSCOPY  2014   COLONOSCOPY WITH PROPOFOL N/A 08/03/2022   Procedure: COLONOSCOPY WITH PROPOFOL;  Surgeon: Jaynie Collins, DO;  Location: [facility] ENDOSCOPY;  Service: Gastroenterology;  Laterality: N/A;   CYSTOSCOPY N/A 05/14/2020   Procedure: CYSTOSCOPY;  Surgeon: Nadara Mustard, MD;  Location: [facility] ORS;  Service: Gynecology;  Laterality: N/A;   DILATION AND CURETTAGE OF UTERUS     ENDOMETRIAL ABLATION W/ NOVASURE  05/29/2009   Dr Tiburcio Pea   left foot surgery     RECTAL EXAM UNDER ANESTHESIA N/A 11/30/2014   Procedure: RECTAL EXAM UNDER ANESTHESIA;  Surgeon: Tiney Rouge III, MD;  Location: [facility] ORS;  Service: General;  Laterality: N/A;   TOTAL LAPAROSCOPIC HYSTERECTOMY WITH BILATERAL SALPINGO OOPHORECTOMY Bilateral 05/14/2020   LAPAROSCOPIC SUPRACERVICAL HYSTERECTOMY WITH BILATERAL SALPINGO OOPHORECTOMY;  Surgeon: Nadara Mustard, MD;  Location: [facility] ORS;  Service: Gynecology;  Laterality: Bilateral;    Family History  Problem Relation Age of Onset   Hypertension Mother    Heart disease Mother 67       "blockages"    Alzheimer's disease Mother    Diverticulitis Father    Hernia Father    Depression Father    Alzheimer's disease Father    Hypothyroidism Sister    Uterine cancer Sister 49   Hypothyroidism Sister    Ovarian cancer Sister    Hypertension Brother    Stroke Maternal Grandfather    Heart attack Maternal Grandfather    Stroke Paternal Grandmother    Hypothyroidism Daughter  Hypothyroidism Daughter    Breast cancer Maternal Aunt 43   Breast cancer Paternal Aunt 8   Ovarian cancer Paternal Aunt    Breast cancer Other 30   Pancreatic cancer Maternal Uncle    Colon cancer Maternal Uncle     Allergies  Allergen Reactions   Latex Rash   Amlodipine     Other reaction(s): Other (See Comments) Ankle swelling   Oxycodone Nausea And Vomiting    No problems with Hydrocodone   Rosuvastatin Nausea Only   Scopolamine Other (See Comments)    "FELT LIKE I COULDN'T BREATHE"       Latest Ref Rng & Units 08/28/2022   10:13 AM 05/08/2020   11:53 AM 05/12/2019   11:45 AM  CBC  WBC 4.0 - 10.5 K/uL 6.7  4.9  4.2   Hemoglobin 12.0 - 15.0 g/dL 57.8  46.9  62.9   Hematocrit 36.0 - 46.0 % 41.7  39.0  38.0   Platelets 150 - 400 K/uL 244  226  204       CMP     Component Value Date/Time   NA 133 (L) 08/28/2022 1013   NA 140 12/20/2015 1223   NA 139 10/10/2013 1448   K 3.0 (L) 08/28/2022 1013   K 4.1 10/10/2013 1448   CL 98 08/28/2022 1013   CL 104 10/10/2013 1448   CO2 23 08/28/2022 1013   CO2 29 10/10/2013 1448   GLUCOSE 134 (H) 08/28/2022 1013   GLUCOSE 81 10/10/2013 1448   BUN 16 08/28/2022 1013   BUN 15 12/20/2015 1223   BUN 12 10/10/2013 1448   CREATININE 0.88 08/28/2022 1013   CREATININE 0.84 05/12/2019 1145   CALCIUM 8.6 (L) 08/28/2022 1013   CALCIUM 9.2 10/10/2013 1448   PROT 7.1 08/28/2022 1013   PROT 7.2 09/14/2013 0738   ALBUMIN 3.9 08/28/2022 1013   ALBUMIN 3.6 09/14/2013 0738   AST 22 08/28/2022 1013   AST 15 09/14/2013 0738   ALT 16 08/28/2022 1013   ALT 20  09/14/2013 0738   ALKPHOS 68 08/28/2022 1013   ALKPHOS 90 09/14/2013 0738   BILITOT 1.0 08/28/2022 1013   BILITOT 0.3 09/14/2013 0738   GFRNONAA >60 08/28/2022 1013   GFRNONAA 76 05/12/2019 1145     No results found.     Assessment & Plan:   1. Varicose veins of leg with pain, right (Primary) I had a long discussion with the patient regarding her lower extremity.  She does have prominent varicosities but at this time she notes that they are not extremely painful or debilitating for her.  We had a long discussion about the endovenous laser ablation.  The ablation itself did not necessarily cause this new varicosity.  We discussed the concept of an accessory saphenous vein.  Based on her presentation I do not feel that she has any DVT.  We had extensive discussion that if she ultimately chooses to do nothing, this is not limb or life-threatening.  She is advised to engage in conservative therapy as noted below.  Following discussion patient does not wish to move forward with sclerotherapy she will follow-up in 3 months  2. Lymphedema The patient still has some lower extremity edema post ablation.  We discussed that she likely does have an underlying component of lymphedema.  Patient is advised to utilize medical grade compression stockings daily.  She should also elevate her lower extremities when not active.  15 to 20 minutes of exercise 3 to 4 days/week  is also advised.  If none of these are helpful with relieving or swelling, lymphedema pump discussion can be had.   Current Outpatient Medications on File Prior to Visit  Medication Sig Dispense Refill   atorvastatin (LIPITOR) 10 MG tablet Take 10 mg by mouth daily.     BD INTEGRA SYRINGE 25G X 1" 3 ML MISC USE 1 SYRINGE MONTHLY     Cholecalciferol 125 MCG (5000 UT) capsule Take by mouth.     cyanocobalamin (VITAMIN B12) 1000 MCG/ML injection Inject into the muscle.     DUPIXENT 300 MG/2ML prefilled syringe Inject into the skin.      estradiol (ESTRACE) 0.5 MG tablet Take 0.5 mg by mouth daily.     levothyroxine (SYNTHROID) 112 MCG tablet TAKE ONE TABLET DAILY. EVERY SUNDAY TAKE AN EXTRA ONE-HALF TABLET. (Patient taking differently: Take 112-168 mcg by mouth See admin instructions. Take 1.5 tablets (168 mcg) by mouth on Sundays & take 1 tablet (112 mcg) by mouth on all other days.) 90 tablet 3   losartan-hydrochlorothiazide (HYZAAR) 100-12.5 MG tablet Take 1 tablet by mouth daily.     spironolactone (ALDACTONE) 25 MG tablet Take 1 tablet by mouth daily.     tacrolimus (PROTOPIC) 0.1 % ointment SMARTSIG:1 Topical Daily     diclofenac (VOLTAREN) 75 MG EC tablet Take 1 tablet (75 mg total) by mouth 2 (two) times daily after a meal. (Patient not taking: Reported on 04/19/2023) 60 tablet 1   Current Facility-Administered Medications on File Prior to Visit  Medication Dose Route Frequency Provider Last Rate Last Admin   betamethasone acetate-betamethasone sodium phosphate (CELESTONE) injection 3 mg  3 mg Intra-articular Once Felecia Shelling, DPM        There are no Patient Instructions on file for this visit. No follow-ups on file.   Georgiana Spinner, NP

## 2023-05-05 ENCOUNTER — Telehealth (INDEPENDENT_AMBULATORY_CARE_PROVIDER_SITE_OTHER): Payer: Self-pay

## 2023-05-05 NOTE — Telephone Encounter (Signed)
 Patient left a message stating that her veins are prominent, popped out, and painful at times. Patient has been taking tylenol or ibuprofen for pain relief. Patient had laser ablation 11/2022. Patient  has recently seen Vivia Birmingham NP on 04/19/23 and is schedule to come in 05/25/23. Please Advise with your vascular standpoint

## 2023-05-06 NOTE — Telephone Encounter (Signed)
 Patient was notified with medical advice and verbalizing understanding

## 2023-05-14 ENCOUNTER — Telehealth (INDEPENDENT_AMBULATORY_CARE_PROVIDER_SITE_OTHER): Payer: Self-pay | Admitting: Vascular Surgery

## 2023-05-14 ENCOUNTER — Telehealth (INDEPENDENT_AMBULATORY_CARE_PROVIDER_SITE_OTHER): Payer: Self-pay

## 2023-05-14 NOTE — Telephone Encounter (Signed)
 She is doing everything she should with tylenol and ibupfrofen.  She can use heat and ice.  She should continue with compression.  She can also reach out to her PCP to evaluate her if her back may be causing her additional pain.  Otherwise we will see her at her follow up

## 2023-05-14 NOTE — Telephone Encounter (Signed)
Replied to previous message

## 2023-05-14 NOTE — Telephone Encounter (Signed)
 Message given

## 2023-05-14 NOTE — Telephone Encounter (Signed)
 Per pt call stated pain in right leg is increasing and her varicose veins are more prominent and keep her awake at night. Pt wanting a sooner appt and wants to know what to do to ease pain. Please advise

## 2023-05-14 NOTE — Telephone Encounter (Signed)
 Patient called stating she has an appt 05/25/23, but she like advice on what to do until then. She had a laser done 11/20/23 and has been in pain on and off since laser. She has pain in her right thigh, inner knee and ankle. More veins have popped up since and causing pain. She is alternating tylenol with ibuprofen, which helps sometimes. Is it okay to use ice?  She stated she will continue to wear compressions.   Please advice

## 2023-05-24 NOTE — Telephone Encounter (Signed)
 Patient has an appointment with JD tomorrow. Pain is getting worse in Right leg- thigh area, knee up. She really thinks she needs an [place] before seeing Dr. Wyn Quaker. Please advise

## 2023-05-25 ENCOUNTER — Encounter (INDEPENDENT_AMBULATORY_CARE_PROVIDER_SITE_OTHER): Payer: Self-pay | Admitting: Vascular Surgery

## 2023-05-25 ENCOUNTER — Ambulatory Visit (INDEPENDENT_AMBULATORY_CARE_PROVIDER_SITE_OTHER): Payer: BC Managed Care – PPO | Admitting: Vascular Surgery

## 2023-05-25 ENCOUNTER — Other Ambulatory Visit (INDEPENDENT_AMBULATORY_CARE_PROVIDER_SITE_OTHER): Payer: Self-pay | Admitting: Vascular Surgery

## 2023-05-25 VITALS — BP 136/82 | HR 99 | Resp 18 | Ht 67.0 in | Wt 223.0 lb

## 2023-05-25 DIAGNOSIS — I10 Essential (primary) hypertension: Secondary | ICD-10-CM | POA: Diagnosis not present

## 2023-05-25 DIAGNOSIS — I83811 Varicose veins of right lower extremities with pain: Secondary | ICD-10-CM

## 2023-05-25 NOTE — Progress Notes (Unsigned)
 MRN : [id number]  Desiree Walker is a 63 y.o. (09/12/1960) female who presents with chief complaint of  Chief Complaint  Patient presents with   Follow-up    F/u 3 months  no studies  .  History of Present Illness: Patient returns today in follow up of her venous disease.  She has developed an enlarging varicosity coursing along the anterior and lateral thigh and knee area which has become much larger and more painful.  No chest pain or shortness of breath.  Last year, she underwent laser ablation of the right great saphenous vein which was successful without complications.  She continues to wear compression socks.  She is elevating her legs.  She is very concerned about a possible blood clot.  Current Outpatient Medications  Medication Sig Dispense Refill   atorvastatin (LIPITOR) 10 MG tablet Take 10 mg by mouth daily.     BD INTEGRA SYRINGE 25G X 1" 3 ML MISC USE 1 SYRINGE MONTHLY     Cholecalciferol 125 MCG (5000 UT) capsule Take by mouth.     cyanocobalamin (VITAMIN B12) 1000 MCG/ML injection Inject into the muscle.     DUPIXENT 300 MG/2ML prefilled syringe Inject into the skin.     escitalopram (LEXAPRO) 5 MG tablet Take 5 mg by mouth daily.     estradiol (ESTRACE) 0.5 MG tablet Take 0.5 mg by mouth daily.     levothyroxine (SYNTHROID) 112 MCG tablet TAKE ONE TABLET DAILY. EVERY SUNDAY TAKE AN EXTRA ONE-HALF TABLET. (Patient taking differently: Take 112-168 mcg by mouth See admin instructions. Take 1.5 tablets (168 mcg) by mouth on Sundays & take 1 tablet (112 mcg) by mouth on all other days.) 90 tablet 3   losartan-hydrochlorothiazide (HYZAAR) 100-12.5 MG tablet Take 1 tablet by mouth daily.     spironolactone (ALDACTONE) 25 MG tablet Take 1 tablet by mouth daily.     tacrolimus (PROTOPIC) 0.1 % ointment SMARTSIG:1 Topical Daily     diclofenac (VOLTAREN) 75 MG EC tablet Take 1 tablet (75 mg total) by mouth 2 (two) times daily after a meal. (Patient not taking: Reported on 04/19/2023)  60 tablet 1   Current Facility-Administered Medications  Medication Dose Route Frequency Provider Last Rate Last Admin   betamethasone acetate-betamethasone sodium phosphate (CELESTONE) injection 3 mg  3 mg Intra-articular Once Felecia Shelling, DPM        Past Medical History:  Diagnosis Date   Anemia    H/O   Anxiety    Arthritis    BRCA negative 08/2018   MyRisk neg except NTHL1 VUS   Complication of anesthesia    Family history of breast cancer 08/2018   IBIS=8.65/riskscore=7.9%   Family history of ovarian cancer    MyRisk neg except NTHL1 VUS   GERD (gastroesophageal reflux disease)    NO MEDS   Hyperlipidemia    Hypertension    Hypothyroidism    Hashimoto's per patient   IFG (impaired fasting glucose) 08/05/2015   Metabolic syndrome 10/13/2016   Palpitations 2012   Evaluated by Julien Nordmann, MD, Holter moniter   PONV (postoperative nausea and vomiting)    Pre-diabetes    Rectal mass    Thyroid disease    unspecified hypothyroidism    Past Surgical History:  Procedure Laterality Date   ANAL FISTULOTOMY N/A 11/30/2014   Procedure: ANAL FISTULOTOMY;  Surgeon: Tiney Rouge III, MD;  Location: [facility] ORS;  Service: General;  Laterality: N/A;   BREAST BIOPSY Right 09/08/2018  affirm bx of mass, coil clip, ORGANIZING FAT NECROSIS AND ADJACENT CHANGES SUGGESTIVE OF RUPTURED CYST   BREAST BIOPSY Right 01/13/2021   PREDOMINANTLY BENIGN FIBROADIPOSE TISSUE SCANT FRAGMENTS OF UNREMARKABLE LYMPH NODE   CESAREAN SECTION     COLONOSCOPY  2014   COLONOSCOPY WITH PROPOFOL N/A 08/03/2022   Procedure: COLONOSCOPY WITH PROPOFOL;  Surgeon: Jaynie Collins, DO;  Location: [facility] ENDOSCOPY;  Service: Gastroenterology;  Laterality: N/A;   CYSTOSCOPY N/A 05/14/2020   Procedure: CYSTOSCOPY;  Surgeon: Nadara Mustard, MD;  Location: [facility] ORS;  Service: Gynecology;  Laterality: N/A;   DILATION AND CURETTAGE OF UTERUS     ENDOMETRIAL ABLATION W/ NOVASURE  05/29/2009   Dr Tiburcio Pea   left  foot surgery     RECTAL EXAM UNDER ANESTHESIA N/A 11/30/2014   Procedure: RECTAL EXAM UNDER ANESTHESIA;  Surgeon: Tiney Rouge III, MD;  Location: [facility] ORS;  Service: General;  Laterality: N/A;   TOTAL LAPAROSCOPIC HYSTERECTOMY WITH BILATERAL SALPINGO OOPHORECTOMY Bilateral 05/14/2020   LAPAROSCOPIC SUPRACERVICAL HYSTERECTOMY WITH BILATERAL SALPINGO OOPHORECTOMY;  Surgeon: Nadara Mustard, MD;  Location: [facility] ORS;  Service: Gynecology;  Laterality: Bilateral;     Social History   Tobacco Use   Smoking status: Never    Passive exposure: Never   Smokeless tobacco: Never  Vaping Use   Vaping status: Never Used  Substance Use Topics   Alcohol use: No    Alcohol/week: 0.0 standard drinks of alcohol   Drug use: No       Family History  Problem Relation Age of Onset   Hypertension Mother    Heart disease Mother 65       "blockages"   Alzheimer's disease Mother    Diverticulitis Father    Hernia Father    Depression Father    Alzheimer's disease Father    Hypothyroidism Sister    Uterine cancer Sister 44   Hypothyroidism Sister    Ovarian cancer Sister    Hypertension Brother    Stroke Maternal Grandfather    Heart attack Maternal Grandfather    Stroke Paternal Grandmother    Hypothyroidism Daughter    Hypothyroidism Daughter    Breast cancer Maternal Aunt 37   Breast cancer Paternal Aunt 62   Ovarian cancer Paternal Aunt    Breast cancer Other 30   Pancreatic cancer Maternal Uncle    Colon cancer Maternal Uncle      Allergies  Allergen Reactions   Latex Rash   Amlodipine     Other reaction(s): Other (See Comments) Ankle swelling   Oxycodone Nausea And Vomiting    No problems with Hydrocodone   Rosuvastatin Nausea Only   Scopolamine Other (See Comments)    "FELT LIKE I COULDN'T BREATHE"      REVIEW OF SYSTEMS (Negative unless checked)   Constitutional: [] Weight loss  [] Fever  [] Chills Cardiac: [] Chest pain   [] Chest pressure   [] Palpitations   [] Shortness  of breath when laying flat   [] Shortness of breath at rest   [] Shortness of breath with exertion. Vascular:  [] Pain in legs with walking   [] Pain in legs at rest   [] Pain in legs when laying flat   [] Claudication   [] Pain in feet when walking  [] Pain in feet at rest  [] Pain in feet when laying flat   [] History of DVT   [] Phlebitis   [x] Swelling in legs   [x] Varicose veins   [] Non-healing ulcers Pulmonary:   [] Uses home oxygen   [] Productive cough   [] Hemoptysis   []   Wheeze  [] COPD   [] Asthma Neurologic:  [] Dizziness  [] Blackouts   [] Seizures   [] History of stroke   [] History of TIA  [] Aphasia   [] Temporary blindness   [] Dysphagia   [] Weakness or numbness in arms   [] Weakness or numbness in legs Musculoskeletal:  [] Arthritis   [x] Joint swelling   [] Joint pain   [] Low back pain Hematologic:  [] Easy bruising  [] Easy bleeding   [] Hypercoagulable state   [] Anemic   Gastrointestinal:  [] Blood in stool   [] Vomiting blood  [] Gastroesophageal reflux/heartburn   [] Abdominal pain Genitourinary:  [] Chronic kidney disease   [] Difficult urination  [] Frequent urination  [] Burning with urination   [] Hematuria Skin:  [] Rashes   [] Ulcers   [] Wounds Psychological:  [x] History of anxiety   []  History of major depression.  Physical Examination  BP 136/82   Pulse 99   Resp 18   Ht 5\' 7"  (1.702 m)   Wt 223 lb (101.2 kg)   BMI 34.93 kg/m  Gen:  WD/WN, NAD Head: [place]/AT, No temporalis wasting. Ear/Nose/Throat: Hearing grossly intact, nares w/o erythema or drainage Eyes: Conjunctiva clear. Sclera non-icteric Neck: Supple.  Trachea midline Pulmonary:  Good air movement, no use of accessory muscles.  Cardiac: RRR, no JVD Vascular:  Vessel Right Left  Radial Palpable Palpable       Musculoskeletal: M/S 5/5 throughout.  No deformity or atrophy.  Prominent varicosities running in the anterior accessory saphenous vein distribution on the right leg.  Trace right lower extremity edema. Neurologic: Sensation grossly  intact in extremities.  Symmetrical.  Speech is fluent.  Psychiatric: Judgment intact, Mood & affect appropriate for pt's clinical situation. Dermatologic: No rashes or ulcers noted.  No cellulitis or open wounds.      Labs No results found for this or any previous visit (from the past 2160 hours).  Radiology No results found.  Assessment/Plan  Hypertension goal BP (blood pressure) < 140/90 blood pressure control important in reducing the progression of atherosclerotic disease. On appropriate oral medications.   Varicose veins of leg with pain, right The patient has a history of venous disease and has already undergone laser ablation of the right great saphenous vein.  She has what appears to be a symptomatic varicosity in the anterior accessory saphenous vein distribution.  This is extremely tortuous and superficial, and will likely need to be treated with foam sclerotherapy to relieve her symptoms.  We are going to get a duplex in the near future at her convenience to ensure there is not a thrombotic issue.  She should continue her conservative measures of compression socks, elevation, and exercise.    Festus Barren, MD  05/26/2023 2:06 PM    This note was created with Dragon medical transcription system.  Any errors from dictation are purely unintentional

## 2023-05-25 NOTE — Assessment & Plan Note (Signed)
 blood pressure control important in reducing the progression of atherosclerotic disease. On appropriate oral medications.

## 2023-05-26 ENCOUNTER — Ambulatory Visit (INDEPENDENT_AMBULATORY_CARE_PROVIDER_SITE_OTHER)

## 2023-05-26 ENCOUNTER — Encounter (INDEPENDENT_AMBULATORY_CARE_PROVIDER_SITE_OTHER)

## 2023-05-26 DIAGNOSIS — I83811 Varicose veins of right lower extremities with pain: Secondary | ICD-10-CM | POA: Diagnosis not present

## 2023-05-26 NOTE — Assessment & Plan Note (Signed)
 The patient has a history of venous disease and has already undergone laser ablation of the right great saphenous vein.  She has what appears to be a symptomatic varicosity in the anterior accessory saphenous vein distribution.  This is extremely tortuous and superficial, and will likely need to be treated with foam sclerotherapy to relieve her symptoms.  We are going to get a duplex in the near future at her convenience to ensure there is not a thrombotic issue.  She should continue her conservative measures of compression socks, elevation, and exercise.

## 2023-05-27 NOTE — Progress Notes (Deleted)
 Referring Physician:  Luciana Axe, NP [address] [place],  [place] [address]  Primary Physician:  Luciana Axe, NP  History of Present Illness: [name] has a history of HTN, varicose veins, sleep apnea, hypothyroidism due to Hashimoto's, hyperlipidemia.   Last seen by me on 03/30/23 to review her lumbar MRI. She has known mild lumbar spondylosis with diffuse mild DDD. She has mild bilateral foraminal stenosis L3-L5. No central or foraminal stenosis seen at L5-S1, but she has disc bulge/facet hypertrophy.   She was to continue with PT and continue prn voltaren.   She is here for follow up.         02/18/23 minimal LBP and more constant left posterior leg pain from butt to her foot. She has known mild lumbar spondylosis with diffuse mild DDD.   She was given voltaren and lumbar MRI ordered.   She is here to review her MRI.   She has no current LBP, has only minimal soreness. She has only minimal left posterior leg pain that is improving. Numbness has improved. Worse with sitting in hard chairs.   [facility] is in [facility] with dry needling. Thinks PT is helping but hated dry needling. She takes prn voltaren.      Conservative measures:  Physical therapy: discharged on 01/21/23 after 5 visits as her back pain was improved- she is back in PT now at [facility] Multimodal medical therapy including regular antiinflammatories: tylenol, motrin  Injections: No epidural steroid injections  Past Surgery: No spine surgery  Desiree Walker has no symptoms of cervical myelopathy.  The symptoms are causing a significant impact on the patient's life.   Review of Systems:  A 10 point review of systems is negative, except for the pertinent positives and negatives detailed in the HPI.  Past Medical History: Past Medical History:  Diagnosis Date   Anemia    H/O   Anxiety    Arthritis    BRCA negative 08/2018   MyRisk neg except NTHL1 VUS    Complication of anesthesia    Family history of breast cancer 08/2018   IBIS=8.65/riskscore=7.9%   Family history of ovarian cancer    MyRisk neg except NTHL1 VUS   GERD (gastroesophageal reflux disease)    NO MEDS   Hyperlipidemia    Hypertension    Hypothyroidism    Hashimoto's per patient   IFG (impaired fasting glucose) 08/05/2015   Metabolic syndrome 10/13/2016   Palpitations 2012   Evaluated by Julien Nordmann, MD, Holter moniter   PONV (postoperative nausea and vomiting)    Pre-diabetes    Rectal mass    Thyroid disease    unspecified hypothyroidism    Past Surgical History: Past Surgical History:  Procedure Laterality Date   ANAL FISTULOTOMY N/A 11/30/2014   Procedure: ANAL FISTULOTOMY;  Surgeon: Tiney Rouge III, MD;  Location: [facility] ORS;  Service: General;  Laterality: N/A;   BREAST BIOPSY Right 09/08/2018   affirm bx of mass, coil clip, ORGANIZING FAT NECROSIS AND ADJACENT CHANGES SUGGESTIVE OF RUPTURED CYST   BREAST BIOPSY Right 01/13/2021   PREDOMINANTLY BENIGN FIBROADIPOSE TISSUE SCANT FRAGMENTS OF UNREMARKABLE LYMPH NODE   CESAREAN SECTION     COLONOSCOPY  2014   COLONOSCOPY WITH PROPOFOL N/A 08/03/2022   Procedure: COLONOSCOPY WITH PROPOFOL;  Surgeon: Jaynie Collins, DO;  Location: [facility] ENDOSCOPY;  Service: Gastroenterology;  Laterality: N/A;   CYSTOSCOPY N/A 05/14/2020   Procedure: CYSTOSCOPY;  Surgeon:  Nadara Mustard, MD;  Location: [facility] ORS;  Service: Gynecology;  Laterality: N/A;   DILATION AND CURETTAGE OF UTERUS     ENDOMETRIAL ABLATION W/ NOVASURE  05/29/2009   Dr Tiburcio Pea   left foot surgery     RECTAL EXAM UNDER ANESTHESIA N/A 11/30/2014   Procedure: RECTAL EXAM UNDER ANESTHESIA;  Surgeon: Tiney Rouge III, MD;  Location: [facility] ORS;  Service: General;  Laterality: N/A;   TOTAL LAPAROSCOPIC HYSTERECTOMY WITH BILATERAL SALPINGO OOPHORECTOMY Bilateral 05/14/2020   LAPAROSCOPIC SUPRACERVICAL HYSTERECTOMY WITH BILATERAL SALPINGO OOPHORECTOMY;  Surgeon:  Nadara Mustard, MD;  Location: [facility] ORS;  Service: Gynecology;  Laterality: Bilateral;    Allergies: Allergies as of 06/08/2023 - Review Complete 05/25/2023  Allergen Reaction Noted   Latex Rash 06/05/2022   Amlodipine  10/06/2021   Oxycodone Nausea And Vomiting 05/06/2021   Rosuvastatin Nausea Only 05/06/2021   Scopolamine Other (See Comments) 11/22/2014    Medications: Outpatient Encounter Medications as of 06/08/2023  Medication Sig   atorvastatin (LIPITOR) 10 MG tablet Take 10 mg by mouth daily.   BD INTEGRA SYRINGE 25G X 1" 3 ML MISC USE 1 SYRINGE MONTHLY   Cholecalciferol 125 MCG (5000 UT) capsule Take by mouth.   cyanocobalamin (VITAMIN B12) 1000 MCG/ML injection Inject into the muscle.   diclofenac (VOLTAREN) 75 MG EC tablet Take 1 tablet (75 mg total) by mouth 2 (two) times daily after a meal. (Patient not taking: Reported on 04/19/2023)   DUPIXENT 300 MG/2ML prefilled syringe Inject into the skin.   escitalopram (LEXAPRO) 5 MG tablet Take 5 mg by mouth daily.   estradiol (ESTRACE) 0.5 MG tablet Take 0.5 mg by mouth daily.   levothyroxine (SYNTHROID) 112 MCG tablet TAKE ONE TABLET DAILY. EVERY SUNDAY TAKE AN EXTRA ONE-HALF TABLET. (Patient taking differently: Take 112-168 mcg by mouth See admin instructions. Take 1.5 tablets (168 mcg) by mouth on Sundays & take 1 tablet (112 mcg) by mouth on all other days.)   losartan-hydrochlorothiazide (HYZAAR) 100-12.5 MG tablet Take 1 tablet by mouth daily.   spironolactone (ALDACTONE) 25 MG tablet Take 1 tablet by mouth daily.   tacrolimus (PROTOPIC) 0.1 % ointment SMARTSIG:1 Topical Daily   Facility-Administered Encounter Medications as of 06/08/2023  Medication   betamethasone acetate-betamethasone sodium phosphate (CELESTONE) injection 3 mg    Social History: Social History   Tobacco Use   Smoking status: Never    Passive exposure: Never   Smokeless tobacco: Never  Vaping Use   Vaping status: Never Used  Substance Use Topics    Alcohol use: No    Alcohol/week: 0.0 standard drinks of alcohol   Drug use: No    Family Medical History: Family History  Problem Relation Age of Onset   Hypertension Mother    Heart disease Mother 31       "blockages"   Alzheimer's disease Mother    Diverticulitis Father    Hernia Father    Depression Father    Alzheimer's disease Father    Hypothyroidism Sister    Uterine cancer Sister 67   Hypothyroidism Sister    Ovarian cancer Sister    Hypertension Brother    Stroke Maternal Grandfather    Heart attack Maternal Grandfather    Stroke Paternal Grandmother    Hypothyroidism Daughter    Hypothyroidism Daughter    Breast cancer Maternal Aunt 64   Breast cancer Paternal Aunt 43   Ovarian cancer Paternal Aunt    Breast cancer Other 30   Pancreatic cancer Maternal Uncle  Colon cancer Maternal Uncle     Physical Examination: There were no vitals filed for this visit.    Awake, alert, oriented to person, place, and time.  Speech is clear and fluent. Fund of knowledge is appropriate.   Cranial Nerves: Pupils equal round and reactive to light.  Facial tone is symmetric.    No abnormal lesions on exposed skin.   Strength: Side Iliopsoas Quads Hamstring PF DF EHL  R 5 5 5 5 5 5   L 5 5 5 5 5 5    Reflexes are 2+ and symmetric at the patella and achilles.     Clonus is not present.   Bilateral lower extremity sensation is intact to light touch.     She walks with a chronic limp since left foot surgery years ago.   Medical Decision Making  Imaging: none  Assessment and Plan: Ms. Nater has no current LBP, has only minimal soreness. She has only minimal left posterior leg pain that is improving. Numbness has improved.   She has known mild lumbar spondylosis with diffuse mild DDD. She has mild bilateral foraminal stenosis L3-L5. No central or foraminal stenosis seen at L5-S1, but she has disc bulge/facet hypertrophy.   Treatment options discussed with patient and  following plan made:   - Continue with PT for lumbar spine.  - Continue on prn voltaren. She takes maybe once a week. Reviewed dosing and side effects. Take with food.  - If symptoms get worse, consider referral for injections.  - Follow up with me in 2 months. Can cancel if doing well.   I spent a total of 20 minutes in face-to-face and non-face-to-face activities related to this patient's care today including review of outside records, review of imaging, review of symptoms, physical exam, discussion of differential diagnosis, discussion of treatment options, and documentation.   Thank you for involving me in the care of this patient.   Drake Leach PA-C Dept. of Neurosurgery

## 2023-06-01 ENCOUNTER — Ambulatory Visit (INDEPENDENT_AMBULATORY_CARE_PROVIDER_SITE_OTHER): Admitting: Vascular Surgery

## 2023-06-01 VITALS — BP 136/83 | HR 98 | Resp 18 | Ht 67.0 in | Wt 223.0 lb

## 2023-06-01 DIAGNOSIS — I83811 Varicose veins of right lower extremities with pain: Secondary | ICD-10-CM

## 2023-06-01 DIAGNOSIS — I10 Essential (primary) hypertension: Secondary | ICD-10-CM

## 2023-06-01 NOTE — Assessment & Plan Note (Signed)
 Recent venous study showed no evidence of DVT or superficial thrombophlebitis.  Her great saphenous vein remains ablated.  The large varicosity is present but does not currently have superficial thrombophlebitis.  I think would be very reasonable to go ahead and consider foam sclerotherapy of this painful varicosity.  Discussed the risks and benefits of the procedure.  Patient voices her understanding and is agreeable to proceed.

## 2023-06-01 NOTE — Progress Notes (Signed)
 MRN : [id number]  Desiree Walker is a 63 y.o. (1960/07/17) female who presents with chief complaint of  Chief Complaint  Patient presents with   Follow-up    Follow up   .  History of Present Illness: Patient returns today in follow up of venous disease.  Since her last visit, we performed a venous study that showed no DVT and no superficial thrombophlebitis.  Her great saphenous vein remains ablated.  She continues to have pain overlying the large varicosity in the right anterior and lateral thigh.  This is likely the anterior accessory saphenous system.  She continues to have pain overlying this vein.  Current Outpatient Medications  Medication Sig Dispense Refill   atorvastatin (LIPITOR) 10 MG tablet Take 10 mg by mouth daily.     BD INTEGRA SYRINGE 25G X 1" 3 ML MISC USE 1 SYRINGE MONTHLY     Cholecalciferol 125 MCG (5000 UT) capsule Take by mouth.     cyanocobalamin (VITAMIN B12) 1000 MCG/ML injection Inject into the muscle.     DUPIXENT 300 MG/2ML prefilled syringe Inject into the skin.     escitalopram (LEXAPRO) 5 MG tablet Take 5 mg by mouth daily.     estradiol (ESTRACE) 0.5 MG tablet Take 0.5 mg by mouth daily.     levothyroxine (SYNTHROID) 112 MCG tablet TAKE ONE TABLET DAILY. EVERY SUNDAY TAKE AN EXTRA ONE-HALF TABLET. (Patient taking differently: Take 112-168 mcg by mouth See admin instructions. Take 1.5 tablets (168 mcg) by mouth on Sundays & take 1 tablet (112 mcg) by mouth on all other days.) 90 tablet 3   losartan-hydrochlorothiazide (HYZAAR) 100-12.5 MG tablet Take 1 tablet by mouth daily.     spironolactone (ALDACTONE) 25 MG tablet Take 1 tablet by mouth daily.     tacrolimus (PROTOPIC) 0.1 % ointment SMARTSIG:1 Topical Daily     diclofenac (VOLTAREN) 75 MG EC tablet Take 1 tablet (75 mg total) by mouth 2 (two) times daily after a meal. (Patient not taking: Reported on 04/19/2023) 60 tablet 1   Current Facility-Administered Medications  Medication Dose Route Frequency  Provider Last Rate Last Admin   betamethasone acetate-betamethasone sodium phosphate (CELESTONE) injection 3 mg  3 mg Intra-articular Once Felecia Shelling, DPM        Past Medical History:  Diagnosis Date   Anemia    H/O   Anxiety    Arthritis    BRCA negative 08/2018   MyRisk neg except NTHL1 VUS   Complication of anesthesia    Family history of breast cancer 08/2018   IBIS=8.65/riskscore=7.9%   Family history of ovarian cancer    MyRisk neg except NTHL1 VUS   GERD (gastroesophageal reflux disease)    NO MEDS   Hyperlipidemia    Hypertension    Hypothyroidism    Hashimoto's per patient   IFG (impaired fasting glucose) 08/05/2015   Metabolic syndrome 10/13/2016   Palpitations 2012   Evaluated by Julien Nordmann, MD, Holter moniter   PONV (postoperative nausea and vomiting)    Pre-diabetes    Rectal mass    Thyroid disease    unspecified hypothyroidism    Past Surgical History:  Procedure Laterality Date   ANAL FISTULOTOMY N/A 11/30/2014   Procedure: ANAL FISTULOTOMY;  Surgeon: Tiney Rouge III, MD;  Location: [facility] ORS;  Service: General;  Laterality: N/A;   BREAST BIOPSY Right 09/08/2018   affirm bx of mass, coil clip, ORGANIZING FAT NECROSIS AND ADJACENT CHANGES SUGGESTIVE OF RUPTURED CYST  BREAST BIOPSY Right 01/13/2021   PREDOMINANTLY BENIGN FIBROADIPOSE TISSUE SCANT FRAGMENTS OF UNREMARKABLE LYMPH NODE   CESAREAN SECTION     COLONOSCOPY  2014   COLONOSCOPY WITH PROPOFOL N/A 08/03/2022   Procedure: COLONOSCOPY WITH PROPOFOL;  Surgeon: Jaynie Collins, DO;  Location: [facility] ENDOSCOPY;  Service: Gastroenterology;  Laterality: N/A;   CYSTOSCOPY N/A 05/14/2020   Procedure: CYSTOSCOPY;  Surgeon: Nadara Mustard, MD;  Location: [facility] ORS;  Service: Gynecology;  Laterality: N/A;   DILATION AND CURETTAGE OF UTERUS     ENDOMETRIAL ABLATION W/ NOVASURE  05/29/2009   Dr Tiburcio Pea   left foot surgery     RECTAL EXAM UNDER ANESTHESIA N/A 11/30/2014   Procedure: RECTAL EXAM UNDER  ANESTHESIA;  Surgeon: Tiney Rouge III, MD;  Location: [facility] ORS;  Service: General;  Laterality: N/A;   TOTAL LAPAROSCOPIC HYSTERECTOMY WITH BILATERAL SALPINGO OOPHORECTOMY Bilateral 05/14/2020   LAPAROSCOPIC SUPRACERVICAL HYSTERECTOMY WITH BILATERAL SALPINGO OOPHORECTOMY;  Surgeon: Nadara Mustard, MD;  Location: [facility] ORS;  Service: Gynecology;  Laterality: Bilateral;     Social History   Tobacco Use   Smoking status: Never    Passive exposure: Never   Smokeless tobacco: Never  Vaping Use   Vaping status: Never Used  Substance Use Topics   Alcohol use: No    Alcohol/week: 0.0 standard drinks of alcohol   Drug use: No      Family History  Problem Relation Age of Onset   Hypertension Mother    Heart disease Mother 33       "blockages"   Alzheimer's disease Mother    Diverticulitis Father    Hernia Father    Depression Father    Alzheimer's disease Father    Hypothyroidism Sister    Uterine cancer Sister 60   Hypothyroidism Sister    Ovarian cancer Sister    Hypertension Brother    Stroke Maternal Grandfather    Heart attack Maternal Grandfather    Stroke Paternal Grandmother    Hypothyroidism Daughter    Hypothyroidism Daughter    Breast cancer Maternal Aunt 66   Breast cancer Paternal Aunt 11   Ovarian cancer Paternal Aunt    Breast cancer Other 30   Pancreatic cancer Maternal Uncle    Colon cancer Maternal Uncle     Allergies  Allergen Reactions   Latex Rash   Amlodipine     Other reaction(s): Other (See Comments) Ankle swelling   Oxycodone Nausea And Vomiting    No problems with Hydrocodone   Rosuvastatin Nausea Only   Scopolamine Other (See Comments)    "FELT LIKE I COULDN'T BREATHE"    REVIEW OF SYSTEMS (Negative unless checked)   Constitutional: [] Weight loss  [] Fever  [] Chills Cardiac: [] Chest pain   [] Chest pressure   [] Palpitations   [] Shortness of breath when laying flat   [] Shortness of breath at rest   [] Shortness of breath with  exertion. Vascular:  [] Pain in legs with walking   [] Pain in legs at rest   [] Pain in legs when laying flat   [] Claudication   [] Pain in feet when walking  [] Pain in feet at rest  [] Pain in feet when laying flat   [] History of DVT   [] Phlebitis   [x] Swelling in legs   [x] Varicose veins   [] Non-healing ulcers Pulmonary:   [] Uses home oxygen   [] Productive cough   [] Hemoptysis   [] Wheeze  [] COPD   [] Asthma Neurologic:  [] Dizziness  [] Blackouts   [] Seizures   [] History of stroke   []   History of TIA  [] Aphasia   [] Temporary blindness   [] Dysphagia   [] Weakness or numbness in arms   [] Weakness or numbness in legs Musculoskeletal:  [] Arthritis   [x] Joint swelling   [] Joint pain   [] Low back pain Hematologic:  [] Easy bruising  [] Easy bleeding   [] Hypercoagulable state   [] Anemic   Gastrointestinal:  [] Blood in stool   [] Vomiting blood  [] Gastroesophageal reflux/heartburn   [] Abdominal pain Genitourinary:  [] Chronic kidney disease   [] Difficult urination  [] Frequent urination  [] Burning with urination   [] Hematuria Skin:  [] Rashes   [] Ulcers   [] Wounds Psychological:  [x] History of anxiety   []  History of major depression.  Physical Examination  There were no vitals taken for this visit. Gen:  WD/WN, NAD Head: Edgar/AT, No temporalis wasting. Ear/Nose/Throat: Hearing grossly intact, nares w/o erythema or drainage Eyes: Conjunctiva clear. Sclera non-icteric Neck: Supple.  Trachea midline Pulmonary:  Good air movement, no use of accessory muscles.  Cardiac: RRR, no JVD Vascular:  Vessel Right Left  Radial Palpable Palpable                          PT Palpable Palpable  DP Palpable Palpable   Gastrointestinal: soft, non-tender/non-distended. No guarding/reflex.  Musculoskeletal: M/S 5/5 throughout.  No deformity or atrophy.  Prominent varicosities coursing in the right anterior and lateral thigh and knee area on the right side.  Trace right lower extremity edema. Neurologic: Sensation grossly  intact in extremities.  Symmetrical.  Speech is fluent.  Psychiatric: Judgment intact, Mood & affect appropriate for pt's clinical situation. Dermatologic: No rashes or ulcers noted.  No cellulitis or open wounds.      Labs No results found for this or any previous visit (from the past 2160 hours).  Radiology VAS [place] LOWER EXTREMITY VENOUS REFLUX Result Date: 05/27/2023  Lower Venous Reflux Study Patient Name:  KEIERRA NUDO  Date of Exam:   05/26/2023 Medical Rec #: [id number]       Accession #:    [id number] Date of Birth: 1960/09/19       Patient Gender: F Patient Age:   21 years Exam Location:  [facility] Procedure:      VAS [place] LOWER EXTREMITY VENOUS REFLUX Referring Phys: Festus Barren --------------------------------------------------------------------------------  Indications: Pain.  Performing Technologist: Debbe Bales RVS  Examination Guidelines: A complete evaluation includes B-mode imaging, spectral Doppler, color Doppler, and power Doppler as needed of all accessible portions of each vessel. Bilateral testing is considered an integral part of a complete examination. Limited examinations for reoccurring indications may be performed as noted. The reflux portion of the exam is performed with the patient in reverse Trendelenburg. Significant venous reflux is defined as >500 ms in the superficial venous system, and >1 second in the deep venous system.  Venous Reflux Times +--------------+---------+------+----------+-----------+----------------------+ RIGHT         Reflux NoReflux  Reflux   Diameter  Comments                                       Yes     Time       cms                           +--------------+---------+------+----------+-----------+----------------------+ CFV           no                                                         +--------------+---------+------+----------+-----------+----------------------+  FV prox       no                                                          +--------------+---------+------+----------+-----------+----------------------+ FV mid        no                                                         +--------------+---------+------+----------+-----------+----------------------+ FV dist       no                                                         +--------------+---------+------+----------+-----------+----------------------+ Popliteal     no                                                         +--------------+---------+------+----------+-----------+----------------------+ GSV at SFJ    no                           .51                           +--------------+---------+------+----------+-----------+----------------------+ GSV prox thighno                           .78                           +--------------+---------+------+----------+-----------+----------------------+ GSV mid thigh                                     prior                                                                    ablation/stripping     +--------------+---------+------+----------+-----------+----------------------+ GSV dist thigh                                    prior  ablation/stripping     +--------------+---------+------+----------+-----------+----------------------+ GSV at knee                                       prior                                                                    ablation/stripping     +--------------+---------+------+----------+-----------+----------------------+ VV Mid Thigh  no                           .25    Laterally              +--------------+---------+------+----------+-----------+----------------------+ VV Distal               yes    535 ms      .20    Laterally              Thigh                                                                     +--------------+---------+------+----------+-----------+----------------------+   Summary: Right: - No evidence of deep vein thrombosis seen in the right lower extremity, from the common femoral through the popliteal veins. - No evidence of superficial venous thrombosis in the right lower extremity. - There is no evidence of venous reflux seen in the right lower extremity.  - Rt GSV Thigh level displays no flow via Ablation; Varicose veins lateral thigh in the Mid/Distal segment.  *See table(s) above for measurements and observations. Electronically signed by Festus Barren MD on 05/27/2023 at 10:59:36 AM.    Final     Assessment/Plan  Varicose veins of leg with pain, right Recent venous study showed no evidence of DVT or superficial thrombophlebitis.  Her great saphenous vein remains ablated.  The large varicosity is present but does not currently have superficial thrombophlebitis.  I think would be very reasonable to go ahead and consider foam sclerotherapy of this painful varicosity.  Discussed the risks and benefits of the procedure.  Patient voices her understanding and is agreeable to proceed.  Hypertension goal BP (blood pressure) < 140/90 blood pressure control important in reducing the progression of atherosclerotic disease. On appropriate oral medications.  Festus Barren, MD  06/01/2023 10:18 AM    This note was created with Dragon medical transcription system.  Any errors from dictation are purely unintentional

## 2023-06-04 ENCOUNTER — Encounter (INDEPENDENT_AMBULATORY_CARE_PROVIDER_SITE_OTHER): Payer: Self-pay | Admitting: Vascular Surgery

## 2023-06-08 ENCOUNTER — Ambulatory Visit: Payer: 59 | Admitting: Orthopedic Surgery

## 2023-06-12 ENCOUNTER — Other Ambulatory Visit: Payer: Self-pay

## 2023-06-12 ENCOUNTER — Ambulatory Visit
Admission: EM | Admit: 2023-06-12 | Discharge: 2023-06-12 | Disposition: A | Discharge status: Home / Self Care | Attending: Emergency Medicine | Admitting: Emergency Medicine

## 2023-06-12 ENCOUNTER — Encounter: Payer: Self-pay | Admitting: Emergency Medicine

## 2023-06-12 DIAGNOSIS — R1031 Right lower quadrant pain: Secondary | ICD-10-CM

## 2023-06-12 MED ORDER — PREDNISONE 20 MG PO TABS: 40.0000 mg | ORAL_TABLET | Freq: Every day | ORAL | 0 refills | Status: DC

## 2023-06-12 MED ORDER — KETOROLAC TROMETHAMINE 30 MG/ML IJ SOLN
30.0000 mg | Freq: Once | INTRAMUSCULAR | Status: AC
  Administered 2023-06-12: 30 mg via INTRAMUSCULAR

## 2023-06-12 MED ORDER — BACLOFEN 5 MG PO TABS: 5.0000 mg | ORAL_TABLET | Freq: Every evening | ORAL | 0 refills | Status: DC | PRN

## 2023-06-12 NOTE — ED Triage Notes (Signed)
 Patient presents to [facility] for evaluation of right groin pain starting 2 days ago after she stepped backwards off of a step onto uneven grass and immediately felt something "pull".  Pain has not improved with the Diclofenac she was already on for something else (patient cannot recall what).  The pain is dull and constant, movement does not change anything.  Radiates to her vagina area

## 2023-06-12 NOTE — ED Provider Notes (Signed)
 Desiree Walker    CSN: [id number] Arrival date & time: 06/12/23  9562      History   Chief Complaint Chief Complaint  Patient presents with   Groin Pain    HPI Desiree Walker is a 63 y.o. female.   Patient presents for constant pain present to the right groin beginning 2 days ago.  Endorses that she stepped backwards on to uneven grass and immediately felt a pull.  Pain described as dull and aching, is not affected by movement.  Radiates to the external vaginal area.  Has attempted use of diclofenac which she takes daily for chronic back pain.  Denies numbness or tingling, urinary changes.  Past Medical History:  Diagnosis Date   Anemia    H/O   Anxiety    Arthritis    BRCA negative 08/2018   MyRisk neg except NTHL1 VUS   Complication of anesthesia    Family history of breast cancer 08/2018   IBIS=8.65/riskscore=7.9%   Family history of ovarian cancer    MyRisk neg except NTHL1 VUS   GERD (gastroesophageal reflux disease)    NO MEDS   Hyperlipidemia    Hypertension    Hypothyroidism    Hashimoto's per patient   IFG (impaired fasting glucose) 08/05/2015   Metabolic syndrome 10/13/2016   Palpitations 2012   Evaluated by Julien Nordmann, MD, Holter moniter   PONV (postoperative nausea and vomiting)    Pre-diabetes    Rectal mass    Thyroid disease    unspecified hypothyroidism    Patient Active Problem List   Diagnosis Date Noted   OSA (obstructive sleep apnea) 04/12/2023   CPAP use counseling 04/12/2023   Varicose veins of leg with pain, right 03/27/2022   Hypothyroidism due to Hashimoto's thyroiditis 06/12/2020   IGT (impaired glucose tolerance) 06/12/2020   Irritant contact dermatitis 06/12/2020   S/P laparoscopic supracervical hysterectomy 05/27/2020   Fibroid uterus 05/14/2020   Rotator cuff tendinitis, right 11/06/2019   Traumatic complete tear of right rotator cuff 11/06/2019   COVID-19 virus infection 03/14/2019   S/P breast biopsy, right  01/12/2019   Breast pain, right 01/12/2019   Family history of breast cancer 06/02/2018   Family history of ovarian cancer 06/02/2018   Eczema 06/10/2017   History of uterine fibroid 10/23/2016   Metabolic syndrome 10/13/2016   Medication monitoring encounter 10/13/2016   Pressure in chest 12/17/2015   Anxiety disorder 12/17/2015   Obesity 12/17/2015   Sleep apnea 08/29/2015   Snoring 08/27/2015   Vitamin D deficiency 08/19/2015   Leg cramps 08/05/2015   IFG (impaired fasting glucose) 08/05/2015   Anal fistula    Other specified symptoms and signs involving the digestive system and abdomen 10/31/2014   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   Bladder neoplasm of uncertain malignant potential 06/09/2013   Female genuine stress incontinence 06/05/2013   Incomplete bladder emptying 06/05/2013   Urge incontinence 06/05/2013   Dysuria 06/05/2013   Other chronic cystitis without hematuria 06/05/2013   Retention of urine 06/05/2013   RLQ abdominal pain 07/27/2012   Palpitations 09/11/2010   Hypothyroidism, adult 05/10/2009   Hyperlipidemia LDL goal <130 05/10/2009   Hypertension goal BP (blood pressure) < 140/90 05/10/2009    Past Surgical History:  Procedure Laterality Date   ANAL FISTULOTOMY N/A 11/30/2014   Procedure: ANAL FISTULOTOMY;  Surgeon: Tiney Rouge III, MD;  Location: [facility] ORS;  Service: General;  Laterality: N/A;   BREAST BIOPSY Right 09/08/2018   affirm bx of mass,  coil clip, ORGANIZING FAT NECROSIS AND ADJACENT CHANGES SUGGESTIVE OF RUPTURED CYST   BREAST BIOPSY Right 01/13/2021   PREDOMINANTLY BENIGN FIBROADIPOSE TISSUE SCANT FRAGMENTS OF UNREMARKABLE LYMPH NODE   CESAREAN SECTION     COLONOSCOPY  2014   COLONOSCOPY WITH PROPOFOL N/A 08/03/2022   Procedure: COLONOSCOPY WITH PROPOFOL;  Surgeon: Jaynie Collins, DO;  Location: [facility] ENDOSCOPY;  Service: Gastroenterology;  Laterality: N/A;   CYSTOSCOPY N/A 05/14/2020   Procedure: CYSTOSCOPY;   Surgeon: Nadara Mustard, MD;  Location: [facility] ORS;  Service: Gynecology;  Laterality: N/A;   DILATION AND CURETTAGE OF UTERUS     ENDOMETRIAL ABLATION W/ NOVASURE  05/29/2009   Dr Tiburcio Pea   left foot surgery     RECTAL EXAM UNDER ANESTHESIA N/A 11/30/2014   Procedure: RECTAL EXAM UNDER ANESTHESIA;  Surgeon: Tiney Rouge III, MD;  Location: [facility] ORS;  Service: General;  Laterality: N/A;   TOTAL LAPAROSCOPIC HYSTERECTOMY WITH BILATERAL SALPINGO OOPHORECTOMY Bilateral 05/14/2020   LAPAROSCOPIC SUPRACERVICAL HYSTERECTOMY WITH BILATERAL SALPINGO OOPHORECTOMY;  Surgeon: Nadara Mustard, MD;  Location: [facility] ORS;  Service: Gynecology;  Laterality: Bilateral;    OB History     Gravida  4   Para  3   Term  3   Preterm      AB  1   Living  3      SAB  1   IAB      Ectopic      Multiple      Live Births  3        Obstetric Comments  1st Menstrual Cycle:  11 1st Pregnancy:  20          Home Medications    Prior to Admission medications   Medication Sig Start Date End Date Taking? Authorizing Provider  baclofen 5 MG TABS Take 1 tablet (5 mg total) by mouth at bedtime as needed for muscle spasms. 06/12/23  Yes Haniya Fern R, NP  predniSONE (DELTASONE) 20 MG tablet Take 2 tablets (40 mg total) by mouth daily. 06/12/23  Yes Prentis Langdon R, NP  atorvastatin (LIPITOR) 10 MG tablet Take 10 mg by mouth daily.    [provider]  BD INTEGRA SYRINGE 25G X 1" 3 ML MISC USE 1 SYRINGE MONTHLY    [provider]  Cholecalciferol 125 MCG (5000 UT) capsule Take by mouth. 06/18/22 06/18/23  [provider]  cyanocobalamin (VITAMIN B12) 1000 MCG/ML injection Inject into the muscle. 06/16/22   [provider]  diclofenac (VOLTAREN) 75 MG EC tablet Take 1 tablet (75 mg total) by mouth 2 (two) times daily after a meal. Patient not taking: Reported on 04/19/2023 02/18/23   Drake Leach, PA-C  DUPIXENT 300 MG/2ML prefilled syringe Inject into the skin.     [provider]  escitalopram (LEXAPRO) 5 MG tablet Take 5 mg by mouth daily. 05/19/23   [provider]  estradiol (ESTRACE) 0.5 MG tablet Take 0.5 mg by mouth daily. 02/21/23   [provider]  levothyroxine (SYNTHROID) 112 MCG tablet TAKE ONE TABLET DAILY. EVERY SUNDAY TAKE AN EXTRA ONE-HALF TABLET. Patient taking differently: Take 112-168 mcg by mouth See admin instructions. Take 1.5 tablets (168 mcg) by mouth on Sundays & take 1 tablet (112 mcg) by mouth on all other days. 01/18/19   Doren Custard, FNP  losartan-hydrochlorothiazide (HYZAAR) 100-12.5 MG tablet Take 1 tablet by mouth daily. 11/03/21   [provider]  spironolactone (ALDACTONE) 25 MG tablet Take 1 tablet by  mouth daily. 06/16/22   [provider]  tacrolimus (PROTOPIC) 0.1 % ointment SMARTSIG:1 Topical Daily    [provider]    Family History Family History  Problem Relation Age of Onset   Hypertension Mother    Heart disease Mother 64       "blockages"   Alzheimer's disease Mother    Diverticulitis Father    Hernia Father    Depression Father    Alzheimer's disease Father    Hypothyroidism Sister    Uterine cancer Sister 39   Hypothyroidism Sister    Ovarian cancer Sister    Hypertension Brother    Stroke Maternal Grandfather    Heart attack Maternal Grandfather    Stroke Paternal Grandmother    Hypothyroidism Daughter    Hypothyroidism Daughter    Breast cancer Maternal Aunt 49   Breast cancer Paternal Aunt 10   Ovarian cancer Paternal Aunt    Breast cancer Other 30   Pancreatic cancer Maternal Uncle    Colon cancer Maternal Uncle     Social History Social History   Tobacco Use   Smoking status: Never    Passive exposure: Never   Smokeless tobacco: Never  Vaping Use   Vaping status: Never Used  Substance Use Topics   Alcohol use: No    Alcohol/week: 0.0 standard drinks of alcohol   Drug use: No     Allergies   Latex, Amlodipine, Oxycodone,  Rosuvastatin, and Scopolamine   Review of Systems Review of Systems   Physical Exam Triage Vital Signs ED Triage Vitals [06/12/23 0901]  Encounter Vitals Group     BP (!) 142/81     Systolic BP Percentile      Diastolic BP Percentile      Pulse Rate 100     Resp 18     Temp (!) 96.7 F (35.9 C)     Temp Source Temporal     SpO2 96 %     Weight      Height      Head Circumference      Peak Flow      Pain Score 3     Pain Loc      Pain Education      Exclude from Growth Chart    No data found.  Updated Vital Signs BP (!) 142/81 (BP Location: Left Arm)   Pulse 100   Temp (!) 96.7 F (35.9 C) (Temporal)   Resp 18   SpO2 96%   Visual Acuity Right Eye Distance:   Left Eye Distance:   Bilateral Distance:    Right Eye Near:   Left Eye Near:    Bilateral Near:     Physical Exam Constitutional:      Appearance: Normal appearance.  Eyes:     Extraocular Movements: Extraocular movements intact.  Pulmonary:     Effort: Pulmonary effort is normal.  Musculoskeletal:       Legs:     Comments: Tenderness present to the center of the right groin, no ecchymosis swelling or deformity, no bulging noted, able to complete full range of motion of the right hip without pain, 2+ femoral pulse  Neurological:     Mental Status: She is alert and oriented to person, place, and time. Mental status is at baseline.      UC Treatments / Results  Labs (all labs ordered are listed, but only abnormal results are displayed) Labs Reviewed - No data to display  EKG  Radiology No results found.  Procedures Procedures (including critical care time)  Medications Ordered in UC Medications  ketorolac (TORADOL) 30 MG/ML injection 30 mg (30 mg Intramuscular Given 06/12/23 0924)    Initial Impression / Assessment and Plan / UC Course  I have reviewed the triage vital signs and the nursing notes.  Pertinent labs & imaging results that were available during my care of the patient  were reviewed by me and considered in my medical decision making (see chart for details).  Right groin pain  Etiology most likely muscular, stable for outpatient management, Toradol IM given and prescribed prednisone and baclofen for home use recommended RICE, heat massage stretching with activity as tolerated advised follow-up with PCP for any further concerns Final Clinical Impressions(s) / UC Diagnoses   Final diagnoses:  Right groin pain     Discharge Instructions      Your pain is most likely caused by irritation to the muscles.  Presentation is not consistent with a hernia  You have been given an injection of ketorolac to help reduce inflammation and pain and ideally will start to see some improvement within the hour  Starting tomorrow take prednisone every morning with food for 5 days to continue the above process, may take Tylenol or any topical medicines additionally, temporarily stop use of daily diclofenac  You may use baclofen at bedtime if needed to help you rest  You may use heating pad in 15 minute intervals as needed for additional comfort, or you may find comfort in using ice in 10-15 minutes over affected area  Begin stretching affected area daily for 10 minutes as tolerated to further loosen muscles   When lying down place pillow underneath and between knees for support  If pain persist after recommended treatment or reoccurs if may be beneficial to follow up with orthopedic specialist for evaluation, this doctor specializes in the bones and can manage your symptoms long-term with options such as but not limited to imaging, medications or physical therapy      ED Prescriptions     Medication Sig Dispense Auth. Provider   predniSONE (DELTASONE) 20 MG tablet Take 2 tablets (40 mg total) by mouth daily. 10 tablet Penni Penado R, NP   baclofen 5 MG TABS Take 1 tablet (5 mg total) by mouth at bedtime as needed for muscle spasms. 10 tablet Valinda Hoar, NP       PDMP not reviewed this encounter.   Valinda Hoar, [place] 06/12/23 [phone]

## 2023-06-12 NOTE — Discharge Instructions (Signed)
 Your pain is most likely caused by irritation to the muscles.  Presentation is not consistent with a hernia  You have been given an injection of ketorolac to help reduce inflammation and pain and ideally will start to see some improvement within the hour  Starting tomorrow take prednisone every morning with food for 5 days to continue the above process, may take Tylenol or any topical medicines additionally, temporarily stop use of daily diclofenac  You may use baclofen at bedtime if needed to help you rest  You may use heating pad in 15 minute intervals as needed for additional comfort, or you may find comfort in using ice in 10-15 minutes over affected area  Begin stretching affected area daily for 10 minutes as tolerated to further loosen muscles   When lying down place pillow underneath and between knees for support  If pain persist after recommended treatment or reoccurs if may be beneficial to follow up with orthopedic specialist for evaluation, this doctor specializes in the bones and can manage your symptoms long-term with options such as but not limited to imaging, medications or physical therapy

## 2023-06-13 ENCOUNTER — Ambulatory Visit: Payer: Self-pay

## 2023-07-16 NOTE — Progress Notes (Unsigned)
 Referring Physician:  No referring provider defined for this encounter.  Primary Physician:  Efraim Grange, NP  History of Present Illness: [name] has a history of HTN, varicose veins, sleep apnea, hypothyroidism due to Hashimoto's, hyperlipidemia.   Last seen by me on 03/30/23 and she was doing well with only minimal soreness in her lower back and left leg. She has known mild lumbar spondylosis with diffuse mild DDD. She has mild bilateral foraminal stenosis L3-L5. No central or foraminal stenosis seen at L5-S1, but she has disc bulge/facet hypertrophy.   She was to continue with PT at last visit. She has been seeing vascular as well.   She is here for follow up.   She has no current LBP, only minimal soreness. She is worried she will hurt her back with bending. She notes intermittent numbness in right foot. She has occasional right posterior leg pain. She is wearing compression hose. No weakness.   She is on baclofen .    Conservative measures:  Physical therapy: discharged on 01/21/23 after 5 visits as her back pain was improved- she is back in PT now at [facility] in [place]- has done 2 visits.  Multimodal medical therapy including regular antiinflammatories: tylenol , motrin   Injections: No epidural steroid injections  Past Surgery: No spine surgery  Desiree Walker has no symptoms of cervical myelopathy.  The symptoms are causing a significant impact on the patient's life.   Review of Systems:  A 10 point review of systems is negative, except for the pertinent positives and negatives detailed in the HPI.  Past Medical History: Past Medical History:  Diagnosis Date   Anemia    H/O   Anxiety    Arthritis    BRCA negative 08/2018   MyRisk neg except NTHL1 VUS   Complication of anesthesia    Family history of breast cancer 08/2018   IBIS=8.65/riskscore=7.9%   Family history of ovarian cancer    MyRisk neg except NTHL1 VUS   GERD (gastroesophageal reflux  disease)    NO MEDS   Hyperlipidemia    Hypertension    Hypothyroidism    Hashimoto's per patient   IFG (impaired fasting glucose) 08/05/2015   Metabolic syndrome 10/13/2016   Palpitations 2012   Evaluated by Timothy Gollan, MD, Holter moniter   PONV (postoperative nausea and vomiting)    Pre-diabetes    Rectal mass    Thyroid  disease    unspecified hypothyroidism    Past Surgical History: Past Surgical History:  Procedure Laterality Date   ANAL FISTULOTOMY N/A 11/30/2014   Procedure: ANAL FISTULOTOMY;  Surgeon: Rhina Center III, MD;  Location: [facility] ORS;  Service: General;  Laterality: N/A;   BREAST BIOPSY Right 09/08/2018   affirm bx of mass, coil clip, ORGANIZING FAT NECROSIS AND ADJACENT CHANGES SUGGESTIVE OF RUPTURED CYST   BREAST BIOPSY Right 01/13/2021   PREDOMINANTLY BENIGN FIBROADIPOSE TISSUE SCANT FRAGMENTS OF UNREMARKABLE LYMPH NODE   CESAREAN SECTION     COLONOSCOPY  2014   COLONOSCOPY WITH PROPOFOL  N/A 08/03/2022   Procedure: COLONOSCOPY WITH PROPOFOL ;  Surgeon: Quintin Buckle, DO;  Location: [facility] ENDOSCOPY;  Service: Gastroenterology;  Laterality: N/A;   CYSTOSCOPY N/A 05/14/2020   Procedure: CYSTOSCOPY;  Surgeon: Alben Alma, MD;  Location: [facility] ORS;  Service: Gynecology;  Laterality: N/A;   DILATION AND CURETTAGE OF UTERUS     ENDOMETRIAL ABLATION W/ NOVASURE  05/29/2009   Dr Raquel Cables   left foot surgery     RECTAL EXAM  UNDER ANESTHESIA N/A 11/30/2014   Procedure: RECTAL EXAM UNDER ANESTHESIA;  Surgeon: Rhina Center III, MD;  Location: [facility] ORS;  Service: General;  Laterality: N/A;   TOTAL LAPAROSCOPIC HYSTERECTOMY WITH BILATERAL SALPINGO OOPHORECTOMY Bilateral 05/14/2020   LAPAROSCOPIC SUPRACERVICAL HYSTERECTOMY WITH BILATERAL SALPINGO OOPHORECTOMY;  Surgeon: Alben Alma, MD;  Location: [facility] ORS;  Service: Gynecology;  Laterality: Bilateral;    Allergies: Allergies as of 07/22/2023 - Review Complete 07/22/2023  Allergen Reaction Noted   Latex Rash  06/05/2022   Amlodipine  10/06/2021   Oxycodone  Nausea And Vomiting 05/06/2021   Rosuvastatin Nausea Only 05/06/2021   Scopolamine Other (See Comments) 11/22/2014    Medications: Outpatient Encounter Medications as of 07/22/2023  Medication Sig   aspirin EC 81 MG tablet Take 81 mg by mouth.   atorvastatin (LIPITOR) 10 MG tablet Take 10 mg by mouth daily.   baclofen  5 MG TABS Take 1 tablet (5 mg total) by mouth at bedtime as needed for muscle spasms.   BD INTEGRA SYRINGE 25G X 1" 3 ML MISC USE 1 SYRINGE MONTHLY   cyanocobalamin (VITAMIN B12) 1000 MCG/ML injection Inject into the muscle.   DUPIXENT 300 MG/2ML prefilled syringe Inject into the skin.   estradiol (ESTRACE) 0.5 MG tablet Take 0.5 mg by mouth daily.   fluticasone  (FLONASE ) 50 MCG/ACT nasal spray    hydrocortisone 2.5 % cream Apply topically 2 (two) times daily.   levothyroxine  (SYNTHROID ) 112 MCG tablet TAKE ONE TABLET DAILY. EVERY SUNDAY TAKE AN EXTRA ONE-HALF TABLET. (Patient taking differently: Take 112-168 mcg by mouth See admin instructions. Take 1.5 tablets (168 mcg) by mouth on Sundays & take 1 tablet (112 mcg) by mouth on all other days.)   losartan -hydrochlorothiazide (HYZAAR) 100-12.5 MG tablet Take 1 tablet by mouth daily.   predniSONE  (DELTASONE ) 20 MG tablet Take 2 tablets (40 mg total) by mouth daily.   spironolactone (ALDACTONE) 25 MG tablet Take 1 tablet by mouth daily.   [DISCONTINUED] diclofenac  (VOLTAREN ) 75 MG EC tablet Take 1 tablet (75 mg total) by mouth 2 (two) times daily after a meal. (Patient not taking: Reported on 04/19/2023)   [DISCONTINUED] escitalopram (LEXAPRO) 5 MG tablet Take 5 mg by mouth daily.   [DISCONTINUED] tacrolimus (PROTOPIC) 0.1 % ointment SMARTSIG:1 Topical Daily   Facility-Administered Encounter Medications as of 07/22/2023  Medication   betamethasone  acetate-betamethasone  sodium phosphate  (CELESTONE ) injection 3 mg    Social History: Social History   Tobacco Use   Smoking status:  Never    Passive exposure: Never   Smokeless tobacco: Never  Vaping Use   Vaping status: Never Used  Substance Use Topics   Alcohol use: No    Alcohol/week: 0.0 standard drinks of alcohol   Drug use: No    Family Medical History: Family History  Problem Relation Age of Onset   Hypertension Mother    Heart disease Mother 61       "blockages"   Alzheimer's disease Mother    Diverticulitis Father    Hernia Father    Depression Father    Alzheimer's disease Father    Hypothyroidism Sister    Uterine cancer Sister 56   Hypothyroidism Sister    Ovarian cancer Sister    Hypertension Brother    Stroke Maternal Grandfather    Heart attack Maternal Grandfather    Stroke Paternal Grandmother    Hypothyroidism Daughter    Hypothyroidism Daughter    Breast cancer Maternal Aunt 63   Breast cancer Paternal Aunt 74   Ovarian  cancer Paternal Aunt    Breast cancer Other 30   Pancreatic cancer Maternal Uncle    Colon cancer Maternal Uncle     Physical Examination: Vitals:   07/22/23 0936 07/22/23 1020  BP: (!) 152/96 (!) 152/90      Awake, alert, oriented to person, place, and time.  Speech is clear and fluent. Fund of knowledge is appropriate.   Cranial Nerves: Pupils equal round and reactive to light.  Facial tone is symmetric.    No abnormal lesions on exposed skin.   Strength: Side Iliopsoas Quads Hamstring PF DF EHL  R 5 5 5 5 5 5   L 5 5 5 5 5 5    Reflexes are 2+ and symmetric at the patella and achilles.     Clonus is not present.   Bilateral lower extremity sensation is intact to light touch.     She walks with a chronic limp since left foot surgery years ago.   Medical Decision Making  Imaging: none   Assessment and Plan: Ms. Osterloh has no current LBP, only minimal soreness. She notes intermittent numbness in right foot. She has occasional right posterior leg pain. No weakness.   She has known mild lumbar spondylosis with diffuse mild DDD. She has mild  bilateral foraminal stenosis L3-L5. No central or foraminal stenosis seen at L5-S1, but she has disc bulge/facet hypertrophy.   She is seeing vascular as well for her right leg pain.   Treatment options discussed with patient and following plan made:   - Continue with PT for lumbar spine. Orders to [facility] in [place].  - If right leg pain gets symptoms get worse, consider referral for injections. May also consider EMG/NCS of lower extremities.  - Continue to follow with vascular. She thinks compression hose are making her worse.  - Follow up with me in 2 months. Can cancel if doing well.   BP was elevated. No symptoms of chest pain, shortness of breath, blurry vision, or headaches. Will recheck at home and call PCP if not improved. If she develops CP, SOB, blurry vision, or headaches, then she will go to ED.     I spent a total of 20 minutes in face-to-face and non-face-to-face activities related to this patient's care today including review of outside records, review of imaging, review of symptoms, physical exam, discussion of differential diagnosis, discussion of treatment options, and documentation.   Lucetta Russel PA-C Dept. of Neurosurgery

## 2023-07-19 ENCOUNTER — Ambulatory Visit (INDEPENDENT_AMBULATORY_CARE_PROVIDER_SITE_OTHER): Payer: BC Managed Care – PPO | Admitting: Nurse Practitioner

## 2023-07-20 ENCOUNTER — Ambulatory Visit (INDEPENDENT_AMBULATORY_CARE_PROVIDER_SITE_OTHER): Payer: BC Managed Care – PPO | Admitting: Vascular Surgery

## 2023-07-20 ENCOUNTER — Ambulatory Visit (INDEPENDENT_AMBULATORY_CARE_PROVIDER_SITE_OTHER): Admitting: Vascular Surgery

## 2023-07-22 ENCOUNTER — Encounter: Payer: Self-pay | Admitting: Orthopedic Surgery

## 2023-07-22 ENCOUNTER — Ambulatory Visit: Payer: Self-pay | Admitting: Orthopedic Surgery

## 2023-07-22 VITALS — BP 152/90 | Ht 67.0 in | Wt 217.0 lb

## 2023-07-22 DIAGNOSIS — M4726 Other spondylosis with radiculopathy, lumbar region: Secondary | ICD-10-CM | POA: Diagnosis not present

## 2023-07-22 DIAGNOSIS — M48061 Spinal stenosis, lumbar region without neurogenic claudication: Secondary | ICD-10-CM

## 2023-07-22 DIAGNOSIS — M51362 Other intervertebral disc degeneration, lumbar region with discogenic back pain and lower extremity pain: Secondary | ICD-10-CM

## 2023-07-22 DIAGNOSIS — M51361 Other intervertebral disc degeneration, lumbar region with lower extremity pain only: Secondary | ICD-10-CM

## 2023-07-22 DIAGNOSIS — M5416 Radiculopathy, lumbar region: Secondary | ICD-10-CM

## 2023-07-22 DIAGNOSIS — M47816 Spondylosis without myelopathy or radiculopathy, lumbar region: Secondary | ICD-10-CM

## 2023-07-22 NOTE — Patient Instructions (Signed)
 It was so nice to see you today. Thank you so much for coming in.    You have some wear and tear in your back that is likely causing your back pain. Not sure it is causing the right leg pain.    I sent physical therapy orders to [facility] in [place].   Follow up with vascular regarding compression hose.   If pain gets worse, can consider lumbar injections. Can also consider EMG/nerve test of legs at some point.   I will see you back in 2 months. Please do not hesitate to call if you have any questions or concerns. You can also message me in MyChart.   Your blood pressure was elevated today. I want you to recheck it at home and follow up with your PCP if it remains high. If you have any chest pain, shortness of breath, blurry vision, or headaches then you need to go to ED.    Lucetta Russel PA-C [phone]     The physicians and staff at [facility] at [facility] are committed to providing excellent care. You may receive a survey asking for feedback about your experience at our office. We value you your feedback and appreciate you taking the time to to fill it out. The [facility] leadership team is also available to discuss your experience in person, feel free to contact us  [phone].

## 2023-07-24 ENCOUNTER — Encounter (INDEPENDENT_AMBULATORY_CARE_PROVIDER_SITE_OTHER): Payer: Self-pay | Admitting: Vascular Surgery

## 2023-07-26 ENCOUNTER — Other Ambulatory Visit (INDEPENDENT_AMBULATORY_CARE_PROVIDER_SITE_OTHER): Payer: Self-pay | Admitting: Nurse Practitioner

## 2023-07-26 DIAGNOSIS — R200 Anesthesia of skin: Secondary | ICD-10-CM

## 2023-07-28 ENCOUNTER — Telehealth (INDEPENDENT_AMBULATORY_CARE_PROVIDER_SITE_OTHER): Payer: Self-pay

## 2023-07-28 NOTE — Telephone Encounter (Signed)
 That is fine. She can come in when he has available time.  I would not recommend this Friday (he's on call) or 5/20 (we have a hold on that day)

## 2023-07-31 ENCOUNTER — Encounter (INDEPENDENT_AMBULATORY_CARE_PROVIDER_SITE_OTHER): Payer: Self-pay | Admitting: Vascular Surgery

## 2023-08-02 NOTE — Telephone Encounter (Signed)
 Patient sent a message on My chart stating no one has called her to schedule an appt. Can someone call her and get her in tomorrow with Dr. Vonna Guardian. - Per Basilio Limb send Mychart message as well.

## 2023-08-02 NOTE — Telephone Encounter (Signed)
 I called patient to get a little more detail about her pains to see what kind of appointment she may need. Desiree Walker stated that at first everything was great after ablation, now she has pain in the right anterior. She also has pain at the ankle going under and the knee causing her leg hurt to at night and she can't sleep. Also she would like to speak about Referral to neuro which as went through and is ready to schedule.  How should this appt be scheduled with Dew, as a consult or US  needed?  Please advise

## 2023-08-02 NOTE — Telephone Encounter (Signed)
 No studies needed, she just had recent studies

## 2023-08-03 ENCOUNTER — Ambulatory Visit (INDEPENDENT_AMBULATORY_CARE_PROVIDER_SITE_OTHER): Admitting: Vascular Surgery

## 2023-08-03 ENCOUNTER — Encounter (INDEPENDENT_AMBULATORY_CARE_PROVIDER_SITE_OTHER): Payer: Self-pay

## 2023-08-03 VITALS — BP 125/80 | HR 96 | Resp 18 | Ht 67.0 in | Wt 217.0 lb

## 2023-08-03 DIAGNOSIS — I10 Essential (primary) hypertension: Secondary | ICD-10-CM | POA: Diagnosis not present

## 2023-08-03 DIAGNOSIS — I83811 Varicose veins of right lower extremities with pain: Secondary | ICD-10-CM

## 2023-08-03 NOTE — Progress Notes (Signed)
 MRN : [id number]  Desiree Walker is a 63 y.o. (1960/07/02) female who presents with chief complaint of  Chief Complaint  Patient presents with   Venous Insufficiency  .  History of Present Illness: Patient returns today in follow up of venous disease with questions about pain and tingling in her right foot and ankle.  She is scheduled to see a neurologist in the near future for further evaluation of neuropathic symptoms.  She still has some noticeable varicosities in the right anterior thigh likely associated with the anterior accessory saphenous system.  These have not really enlarged and are not all that painful at this point.  Does still occasionally get some swelling down around the foot and ankle on that right side.  No new wounds.  No chest pain or shortness of breath.  Current Outpatient Medications  Medication Sig Dispense Refill   atorvastatin (LIPITOR) 10 MG tablet Take 10 mg by mouth daily.     BD INTEGRA SYRINGE 25G X 1" 3 ML MISC USE 1 SYRINGE MONTHLY     cyanocobalamin (VITAMIN B12) 1000 MCG/ML injection Inject into the muscle.     DUPIXENT 300 MG/2ML prefilled syringe Inject into the skin.     estradiol (ESTRACE) 0.5 MG tablet Take 0.5 mg by mouth daily.     hydrocortisone 2.5 % cream Apply topically 2 (two) times daily.     levothyroxine  (SYNTHROID ) 112 MCG tablet TAKE ONE TABLET DAILY. EVERY SUNDAY TAKE AN EXTRA ONE-HALF TABLET. (Patient taking differently: Take 112-168 mcg by mouth See admin instructions. Take 1.5 tablets (168 mcg) by mouth on Sundays & take 1 tablet (112 mcg) by mouth on all other days.) 90 tablet 3   losartan -hydrochlorothiazide (HYZAAR) 100-12.5 MG tablet Take 1 tablet by mouth daily.     spironolactone (ALDACTONE) 25 MG tablet Take 1 tablet by mouth daily.     tacrolimus (PROTOPIC) 0.1 % ointment Apply topically 2 (two) times daily.     aspirin EC 81 MG tablet Take 81 mg by mouth. (Patient not taking: Reported on 08/03/2023)     baclofen  5 MG TABS Take  1 tablet (5 mg total) by mouth at bedtime as needed for muscle spasms. (Patient not taking: Reported on 08/03/2023) 10 tablet 0   fluticasone  (FLONASE ) 50 MCG/ACT nasal spray  (Patient not taking: Reported on 08/03/2023)     predniSONE  (DELTASONE ) 20 MG tablet Take 2 tablets (40 mg total) by mouth daily. (Patient not taking: Reported on 08/03/2023) 10 tablet 0   Current Facility-Administered Medications  Medication Dose Route Frequency Provider Last Rate Last Admin   betamethasone  acetate-betamethasone  sodium phosphate  (CELESTONE ) injection 3 mg  3 mg Intra-articular Once Evans, Brent M, DPM        Past Medical History:  Diagnosis Date   Anemia    H/O   Anxiety    Arthritis    BRCA negative 08/2018   MyRisk neg except NTHL1 VUS   Complication of anesthesia    Family history of breast cancer 08/2018   IBIS=8.65/riskscore=7.9%   Family history of ovarian cancer    MyRisk neg except NTHL1 VUS   GERD (gastroesophageal reflux disease)    NO MEDS   Hyperlipidemia    Hypertension    Hypothyroidism    Hashimoto's per patient   IFG (impaired fasting glucose) 08/05/2015   Metabolic syndrome 10/13/2016   Palpitations 2012   Evaluated by Timothy Gollan, MD, Holter moniter   PONV (postoperative nausea and vomiting)    Pre-diabetes  Rectal mass    Thyroid  disease    unspecified hypothyroidism    Past Surgical History:  Procedure Laterality Date   ANAL FISTULOTOMY N/A 11/30/2014   Procedure: ANAL FISTULOTOMY;  Surgeon: Rhina Center III, MD;  Location: [facility] ORS;  Service: General;  Laterality: N/A;   BREAST BIOPSY Right 09/08/2018   affirm bx of mass, coil clip, ORGANIZING FAT NECROSIS AND ADJACENT CHANGES SUGGESTIVE OF RUPTURED CYST   BREAST BIOPSY Right 01/13/2021   PREDOMINANTLY BENIGN FIBROADIPOSE TISSUE SCANT FRAGMENTS OF UNREMARKABLE LYMPH NODE   CESAREAN SECTION     COLONOSCOPY  2014   COLONOSCOPY WITH PROPOFOL  N/A 08/03/2022   Procedure: COLONOSCOPY WITH PROPOFOL ;  Surgeon: Quintin Buckle, DO;  Location: [facility] ENDOSCOPY;  Service: Gastroenterology;  Laterality: N/A;   CYSTOSCOPY N/A 05/14/2020   Procedure: CYSTOSCOPY;  Surgeon: Alben Alma, MD;  Location: [facility] ORS;  Service: Gynecology;  Laterality: N/A;   DILATION AND CURETTAGE OF UTERUS     ENDOMETRIAL ABLATION W/ NOVASURE  05/29/2009   Dr Raquel Cables   left foot surgery     RECTAL EXAM UNDER ANESTHESIA N/A 11/30/2014   Procedure: RECTAL EXAM UNDER ANESTHESIA;  Surgeon: Rhina Center III, MD;  Location: [facility] OR[facility];  Service: General;  Laterality: N/A;   TOTAL LAPAROSCOPIC HYSTERECTOMY WITH BILATERAL SALPINGO OOPHORECTOMY Bilateral 05/14/2020   LAPAROSCOPIC SUPRACERVICAL HYSTERECTOMY WITH BILATERAL SALPINGO OOPHORECTOMY;  Surgeon: Alben Alma, MD;  Location: [facility] ORS;  Service: Gynecology;  Laterality: Bilateral;     Social History   Tobacco Use   Smoking status: Never    Passive exposure: Never   Smokeless tobacco: Never  Vaping Use   Vaping status: Never Used  Substance Use Topics   Alcohol use: No    Alcohol/week: 0.0 standard drinks of alcohol   Drug use: No      Family History  Problem Relation Age of Onset   Hypertension Mother    Heart disease Mother 15       "blockages"   Alzheimer's disease Mother    Diverticulitis Father    Hernia Father    Depression Father    Alzheimer's disease Father    Hypothyroidism Sister    Uterine cancer Sister 70   Hypothyroidism Sister    Ovarian cancer Sister    Hypertension Brother    Stroke Maternal Grandfather    Heart attack Maternal Grandfather    Stroke Paternal Grandmother    Hypothyroidism Daughter    Hypothyroidism Daughter    Breast cancer Maternal Aunt 86   Breast cancer Paternal Aunt 15   Ovarian cancer Paternal Aunt    Breast cancer Other 30   Pancreatic cancer Maternal Uncle    Colon cancer Maternal Uncle     Allergies  Allergen Reactions   Latex Rash   Amlodipine     Other reaction(s): Other (See Comments) Ankle swelling    Oxycodone  Nausea And Vomiting    No problems with Hydrocodone    Rosuvastatin Nausea Only   Scopolamine Other (See Comments)    "FELT LIKE I COULDN'T BREATHE"      REVIEW OF SYSTEMS (Negative unless checked)   Constitutional: [] Weight loss  [] Fever  [] Chills Cardiac: [] Chest pain   [] Chest pressure   [] Palpitations   [] Shortness of breath when laying flat   [] Shortness of breath at rest   [] Shortness of breath with exertion. Vascular:  [] Pain in legs with walking   [] Pain in legs at rest   [] Pain in legs when laying flat   [] Claudication   []   Pain in feet when walking  [] Pain in feet at rest  [] Pain in feet when laying flat   [] History of DVT   [] Phlebitis   [x] Swelling in legs   [x] Varicose veins   [] Non-healing ulcers Pulmonary:   [] Uses home oxygen   [] Productive cough   [] Hemoptysis   [] Wheeze  [] COPD   [] Asthma Neurologic:  [] Dizziness  [] Blackouts   [] Seizures   [] History of stroke   [] History of TIA  [] Aphasia   [] Temporary blindness   [] Dysphagia   [] Weakness or numbness in arms   [] Weakness or numbness in legs Musculoskeletal:  [] Arthritis   [x] Joint swelling   [] Joint pain   [] Low back pain Hematologic:  [] Easy bruising  [] Easy bleeding   [] Hypercoagulable state   [] Anemic   Gastrointestinal:  [] Blood in stool   [] Vomiting blood  [] Gastroesophageal reflux/heartburn   [] Abdominal pain Genitourinary:  [] Chronic kidney disease   [] Difficult urination  [] Frequent urination  [] Burning with urination   [] Hematuria Skin:  [] Rashes   [] Ulcers   [] Wounds Psychological:  [x] History of anxiety   []  History of major depression.  Physical Examination  BP 125/80 (BP Location: Left Arm, Patient Position: Sitting, Cuff Size: Large)   Pulse 96   Resp 18   Ht 5\' 7"  (1.702 m)   Wt 217 lb (98.4 kg)   BMI 33.99 kg/m  Gen:  WD/WN, NAD Head: Foley/AT, No temporalis wasting. Ear/Nose/Throat: Hearing grossly intact, nares w/o erythema or drainage Eyes: Conjunctiva clear. Sclera non-icteric Neck:  Supple.  Trachea midline Pulmonary:  Good air movement, no use of accessory muscles.  Cardiac: RRR, no JVD Vascular:  Vessel Right Left  Radial Palpable Palpable                          PT Palpable Palpable  DP Palpable Palpable   Gastrointestinal: soft, non-tender/non-distended. No guarding/reflex.  Musculoskeletal: M/S 5/5 throughout.  No deformity or atrophy. No edema. Neurologic: Sensation grossly intact in extremities.  Symmetrical.  Speech is fluent.  Psychiatric: Judgment intact, Mood & affect appropriate for pt's clinical situation. Dermatologic: No rashes or ulcers noted.  No cellulitis or open wounds.      Labs No results found for this or any previous visit (from the past 2160 hours).  Radiology No results found.  Assessment/Plan  Varicose veins of leg with pain, right She has had a successful laser ablation of the right great saphenous vein.  I believe that all of her questions were answered to her satisfaction.  I do not think any further venous treatment is likely of much benefit at this point.  I would agree with a neurology evaluation at this point.  Hypertension goal BP (blood pressure) < 140/90 blood pressure control important in reducing the progression of atherosclerotic disease. On appropriate oral medications.  Mikki Alexander, MD  08/03/2023 4:41 PM    This note was created with Dragon medical transcription system.  Any errors from dictation are purely unintentional

## 2023-08-03 NOTE — Assessment & Plan Note (Signed)
 She has had a successful laser ablation of the right great saphenous vein.  I believe that all of her questions were answered to her satisfaction.  I do not think any further venous treatment is likely of much benefit at this point.  I would agree with a neurology evaluation at this point.

## 2023-08-17 ENCOUNTER — Encounter (INDEPENDENT_AMBULATORY_CARE_PROVIDER_SITE_OTHER): Payer: Self-pay | Admitting: Vascular Surgery

## 2023-08-17 NOTE — Telephone Encounter (Signed)
 Please review

## 2023-08-18 NOTE — Telephone Encounter (Signed)
More questions

## 2023-09-03 ENCOUNTER — Encounter: Payer: Self-pay | Admitting: Gastroenterology

## 2023-09-17 NOTE — Progress Notes (Deleted)
 Referring Physician:  Steva Clotilda DEL, NP [address] [place],  [place] [address]  Primary Physician:  Steva Clotilda DEL, NP  History of Present Illness: [name] has a history of HTN, varicose veins, sleep apnea, hypothyroidism due to Hashimoto's, hyperlipidemia.   Last seen by me on 07/22/23 and she was doing well with no LBP and intermittent numbness in right foot. She has known mild lumbar spondylosis with diffuse mild DDD. She has mild bilateral foraminal stenosis L3-L5. No central or foraminal stenosis seen at L5-S1, but she has disc bulge/facet hypertrophy.   She was to continue with PT for lumbar spine and to follow up with vascular- Dr. Marea agreed with evaluation with neurology.   She saw Dr. Lane on 08/16/23- he started her on neurontin  and scheduled an EMG of her right leg.   She saw Dr. Dodson on 09/23/23 for her back pain. ***  She is here for follow up.       only minimal soreness in her lower back and left leg. She has known mild lumbar spondylosis with diffuse mild DDD. She has mild bilateral foraminal stenosis L3-L5. No central or foraminal stenosis seen at L5-S1, but she has disc bulge/facet hypertrophy.   She was to continue with PT at last visit. She has been seeing vascular as well.   She is here for follow up.   She has no current LBP, only minimal soreness. She is worried she will hurt her back with bending. She notes intermittent numbness in right foot. She has occasional right posterior leg pain. She is wearing compression hose. No weakness.   She is on baclofen .    Conservative measures:  Physical therapy: discharged on 01/21/23 after 5 visits as her back pain was improved- she is back in PT now at [facility] in [place]- has done 2 visits.  Multimodal medical therapy including regular antiinflammatories: tylenol , motrin   Injections: No epidural steroid injections  Past Surgery: No spine surgery  Desiree Walker has no symptoms of cervical  myelopathy.  The symptoms are causing a significant impact on the patient's life.   Review of Systems:  A 10 point review of systems is negative, except for the pertinent positives and negatives detailed in the HPI.  Past Medical History: Past Medical History:  Diagnosis Date   Anemia    H/O   Anxiety    Arthritis    BRCA negative 08/2018   MyRisk neg except NTHL1 VUS   Complication of anesthesia    Family history of breast cancer 08/2018   IBIS=8.65/riskscore=7.9%   Family history of ovarian cancer    MyRisk neg except NTHL1 VUS   GERD (gastroesophageal reflux disease)    NO MEDS   Hyperlipidemia    Hypertension    Hypothyroidism    Hashimoto's per patient   IFG (impaired fasting glucose) 08/05/2015   Metabolic syndrome 10/13/2016   Palpitations 2012   Evaluated by Timothy Gollan, MD, Holter moniter   PONV (postoperative nausea and vomiting)    Pre-diabetes    Rectal mass    Thyroid  disease    unspecified hypothyroidism    Past Surgical History: Past Surgical History:  Procedure Laterality Date   ANAL FISTULOTOMY N/A 11/30/2014   Procedure: ANAL FISTULOTOMY;  Surgeon: Elgin Laurence III, MD;  Location: [facility] ORS;  Service: General;  Laterality: N/A;   BREAST BIOPSY Right 09/08/2018   affirm bx of mass, coil clip, ORGANIZING FAT NECROSIS AND ADJACENT CHANGES SUGGESTIVE OF RUPTURED CYST  BREAST BIOPSY Right 01/13/2021   PREDOMINANTLY BENIGN FIBROADIPOSE TISSUE SCANT FRAGMENTS OF UNREMARKABLE LYMPH NODE   CESAREAN SECTION     COLONOSCOPY  2014   COLONOSCOPY WITH PROPOFOL  N/A 08/03/2022   Procedure: COLONOSCOPY WITH PROPOFOL ;  Surgeon: Onita Elspeth Sharper, DO;  Location: [facility] ENDOSCOPY;  Service: Gastroenterology;  Laterality: N/A;   CYSTOSCOPY N/A 05/14/2020   Procedure: CYSTOSCOPY;  Surgeon: Arloa Lamar SQUIBB, MD;  Location: [facility] ORS;  Service: Gynecology;  Laterality: N/A;   DILATION AND CURETTAGE OF UTERUS     ENDOMETRIAL ABLATION W/ NOVASURE  05/29/2009   Dr Arloa    left foot surgery     RECTAL EXAM UNDER ANESTHESIA N/A 11/30/2014   Procedure: RECTAL EXAM UNDER ANESTHESIA;  Surgeon: Elgin Laurence III, MD;  Location: [facility] ORS;  Service: General;  Laterality: N/A;   TOTAL LAPAROSCOPIC HYSTERECTOMY WITH BILATERAL SALPINGO OOPHORECTOMY Bilateral 05/14/2020   LAPAROSCOPIC SUPRACERVICAL HYSTERECTOMY WITH BILATERAL SALPINGO OOPHORECTOMY;  Surgeon: Arloa Lamar SQUIBB, MD;  Location: [facility] ORS;  Service: Gynecology;  Laterality: Bilateral;    Allergies: Allergies as of 09/27/2023 - Review Complete 09/03/2023  Allergen Reaction Noted   Latex Rash 06/05/2022   Amlodipine  10/06/2021   Oxycodone  Nausea And Vomiting 05/06/2021   Rosuvastatin Nausea Only 05/06/2021   Scopolamine Other (See Comments) 11/22/2014    Medications: Outpatient Encounter Medications as of 09/27/2023  Medication Sig   aspirin EC 81 MG tablet Take 81 mg by mouth. (Patient not taking: Reported on 08/03/2023)   atorvastatin (LIPITOR) 10 MG tablet Take 10 mg by mouth daily.   baclofen  5 MG TABS Take 1 tablet (5 mg total) by mouth at bedtime as needed for muscle spasms. (Patient not taking: Reported on 08/03/2023)   BD INTEGRA SYRINGE 25G X 1 3 ML MISC USE 1 SYRINGE MONTHLY   cyanocobalamin (VITAMIN B12) 1000 MCG/ML injection Inject into the muscle.   DUPIXENT 300 MG/2ML prefilled syringe Inject into the skin.   estradiol (ESTRACE) 0.5 MG tablet Take 0.5 mg by mouth daily.   fluticasone  (FLONASE ) 50 MCG/ACT nasal spray  (Patient not taking: Reported on 08/03/2023)   hydrocortisone 2.5 % cream Apply topically 2 (two) times daily.   levothyroxine  (SYNTHROID ) 112 MCG tablet TAKE ONE TABLET DAILY. EVERY SUNDAY TAKE AN EXTRA ONE-HALF TABLET. (Patient taking differently: Take 112-168 mcg by mouth See admin instructions. Take 1.5 tablets (168 mcg) by mouth on Sundays & take 1 tablet (112 mcg) by mouth on all other days.)   losartan -hydrochlorothiazide (HYZAAR) 100-12.5 MG tablet Take 1 tablet by mouth  daily.   predniSONE  (DELTASONE ) 20 MG tablet Take 2 tablets (40 mg total) by mouth daily. (Patient not taking: Reported on 08/03/2023)   spironolactone (ALDACTONE) 25 MG tablet Take 1 tablet by mouth daily.   tacrolimus (PROTOPIC) 0.1 % ointment Apply topically 2 (two) times daily.   Facility-Administered Encounter Medications as of 09/27/2023  Medication   betamethasone  acetate-betamethasone  sodium phosphate  (CELESTONE ) injection 3 mg    Social History: Social History   Tobacco Use   Smoking status: Never    Passive exposure: Never   Smokeless tobacco: Never  Vaping Use   Vaping status: Never Used  Substance Use Topics   Alcohol use: No    Alcohol/week: 0.0 standard drinks of alcohol   Drug use: No    Family Medical History: Family History  Problem Relation Age of Onset   Hypertension Mother    Heart disease Mother 48       blockages   Alzheimer's disease Mother  Diverticulitis Father    Hernia Father    Depression Father    Alzheimer's disease Father    Hypothyroidism Sister    Uterine cancer Sister 35   Hypothyroidism Sister    Ovarian cancer Sister    Hypertension Brother    Stroke Maternal Grandfather    Heart attack Maternal Grandfather    Stroke Paternal Grandmother    Hypothyroidism Daughter    Hypothyroidism Daughter    Breast cancer Maternal Aunt 71   Breast cancer Paternal Aunt 82   Ovarian cancer Paternal Aunt    Breast cancer Other 30   Pancreatic cancer Maternal Uncle    Colon cancer Maternal Uncle     Physical Examination: There were no vitals filed for this visit.     Awake, alert, oriented to person, place, and time.  Speech is clear and fluent. Fund of knowledge is appropriate.   Cranial Nerves: Pupils equal round and reactive to light.  Facial tone is symmetric.    No abnormal lesions on exposed skin.   Strength: Side Iliopsoas Quads Hamstring PF DF EHL  R 5 5 5 5 5 5   L 5 5 5 5 5 5    Reflexes are 2+ and symmetric at the  patella and achilles.     Clonus is not present.   Bilateral lower extremity sensation is intact to light touch.     She walks with a chronic limp since left foot surgery years ago.   Medical Decision Making  Imaging: none   Assessment and Plan: Ms. Kutsch has no current LBP, only minimal soreness. She notes intermittent numbness in right foot. She has occasional right posterior leg pain. No weakness.   She has known mild lumbar spondylosis with diffuse mild DDD. She has mild bilateral foraminal stenosis L3-L5. No central or foraminal stenosis seen at L5-S1, but she has disc bulge/facet hypertrophy.   She is seeing vascular as well for her right leg pain.   Treatment options discussed with patient and following plan made:   - Continue with PT for lumbar spine. Orders to [facility] in [place].  - If right leg pain gets symptoms get worse, consider referral for injections. May also consider EMG/NCS of lower extremities.  - Continue to follow with vascular. She thinks compression hose are making her worse.  - Follow up with me in 2 months. Can cancel if doing well.   BP was elevated. No symptoms of chest pain, shortness of breath, blurry vision, or headaches. Will recheck at home and call PCP if not improved. If she develops CP, SOB, blurry vision, or headaches, then she will go to ED.     I spent a total of 20 minutes in face-to-face and non-face-to-face activities related to this patient's care today including review of outside records, review of imaging, review of symptoms, physical exam, discussion of differential diagnosis, discussion of treatment options, and documentation.   Glade Boys PA-C Dept. of Neurosurgery

## 2023-09-24 ENCOUNTER — Other Ambulatory Visit: Payer: Self-pay

## 2023-09-24 ENCOUNTER — Emergency Department

## 2023-09-24 ENCOUNTER — Ambulatory Visit: Payer: Self-pay

## 2023-09-24 ENCOUNTER — Emergency Department
Admission: EM | Admit: 2023-09-24 | Discharge: 2023-09-24 | Disposition: A | Discharge status: Home / Self Care | Attending: Emergency Medicine | Admitting: Emergency Medicine

## 2023-09-24 DIAGNOSIS — E039 Hypothyroidism, unspecified: Secondary | ICD-10-CM | POA: Insufficient documentation

## 2023-09-24 DIAGNOSIS — R002 Palpitations: Secondary | ICD-10-CM | POA: Diagnosis not present

## 2023-09-24 DIAGNOSIS — M7989 Other specified soft tissue disorders: Secondary | ICD-10-CM | POA: Diagnosis present

## 2023-09-24 DIAGNOSIS — I10 Essential (primary) hypertension: Secondary | ICD-10-CM | POA: Insufficient documentation

## 2023-09-24 LAB — BASIC METABOLIC PANEL WITH GFR
Anion gap: 10 (ref 5–15)
BUN: 23 mg/dL (ref 8–23)
CO2: 25 mmol/L (ref 22–32)
Calcium: 10 mg/dL (ref 8.9–10.3)
Chloride: 104 mmol/L (ref 98–111)
Creatinine, Ser: 0.87 mg/dL (ref 0.44–1.00)
GFR, Estimated: >60 mL/min (ref >60)
Glucose, Bld: 110 mg/dL — ABNORMAL HIGH (ref 70–99)
Potassium: 4.1 mmol/L (ref 3.5–5.1)
Sodium: 139 mmol/L (ref 135–145)

## 2023-09-24 LAB — CBC
HCT: 41.2 % (ref 36.0–46.0)
Hemoglobin: 13.5 g/dL (ref 12.0–15.0)
MCH: 26.8 pg (ref 26.0–34.0)
MCHC: 32.8 g/dL (ref 30.0–36.0)
MCV: 81.9 fL (ref 80.0–100.0)
Platelets: 247 K/uL (ref 150–400)
RBC: 5.03 MIL/uL (ref 3.87–5.11)
RDW: 13.3 % (ref 11.5–15.5)
WBC: 8.3 K/uL (ref 4.0–10.5)
nRBC: 0 % (ref 0.0–0.2)

## 2023-09-24 LAB — D-DIMER, QUANTITATIVE: D-Dimer, Quant: 1.1 ug{FEU}/mL — ABNORMAL HIGH (ref 0.00–0.50)

## 2023-09-24 LAB — TROPONIN I (HIGH SENSITIVITY): Troponin I (High Sensitivity): 4 ng/L (ref <18)

## 2023-09-24 MED ORDER — IOHEXOL 350 MG/ML SOLN
100.0000 mL | Freq: Once | INTRAVENOUS | Status: AC | PRN
  Administered 2023-09-24: 100 mL via INTRAVENOUS

## 2023-09-24 NOTE — ED Triage Notes (Addendum)
 Pt to Ed via POV from home. Pt reports right lower leg swelling and tenderness. Pt reports hx of venous ablation. No SOB or CP.   Pt requesting blood work in triage.

## 2023-09-24 NOTE — ED Provider Triage Note (Signed)
 Emergency Medicine Provider Triage Evaluation Note  Desiree Walker , a 63 y.o. female  was evaluated in triage.  Pt complains of right lower leg swelling and tenderness. No SOB or CP. Is requesting labs. No fever.    Physical Exam  BP (!) 114/98   Pulse 98   Temp 98.1 F (36.7 C) (Oral)   Resp 20   SpO2 98%  Gen:   Awake, no distress   Resp:  Normal effort  MSK:   Moves extremities without difficulty  Other:  Right lower leg anterior TTP and mild swelling. No rash.  Medical Decision Making  Medically screening exam initiated at 6:33 PM.  Appropriate orders placed.  Desiree Walker was informed that the remainder of the evaluation will be completed by another provider, this initial triage assessment does not replace that evaluation, and the importance of remaining in the ED until their evaluation is complete.  CBC, BMP, D-dimer, DVT US  right lower leg   Sheron Salm, PA-C 09/24/23 1835

## 2023-09-24 NOTE — ED Provider Notes (Signed)
 11:25 PM  Assumed care at shift change.  Patient here for palpitations, leg swelling.  Venous Doppler of the right leg negative for DVT.  D-dimer positive.  CTA pending and reviewed/interpreted by myself and the radiologist and shows no acute abnormality, no PE.  Troponin negative.  Will discharge with outpatient follow-up.   Miko Sirico, Josette SAILOR, DO 09/24/23 [id number]

## 2023-09-24 NOTE — Discharge Instructions (Addendum)
 Please make sure to keep your legs elevated when you are sitting or standing, please continue to use compression stockings.  Please make sure to follow-up with your primary care doctor for further management of your symptoms.  Ultrasound of your leg showed no DVT.  CT of your chest showed no pulmonary embolus.

## 2023-09-24 NOTE — ED Provider Notes (Signed)
 [facility] Provider Note    Event Date/Time   First MD Initiated Contact with Patient 09/24/23 2147     (approximate)   History   Leg Swelling   HPI  JEANNELLE WIENS is a 63 y.o. female with history of anxiety, hypothyroidism, hyperlipidemia, hypertension, presenting with right lower extremity swelling for the past 2 weeks.  She denies any chest pain or shortness of breath.  Does note some palpitations and fluttering her chest occasionally.  Denies any lightheadedness or syncopal episodes.  States no new weakness or numbness.  Did get an ablation done to her right lower extremity, states that swelling started after that.  Is on spironolactone as well as hydrochlorothiazide.   On admission chart review, she was seen by her primary care doctor at the end of June for nerve pain to her right ankle.  Had an ablation done to her veins in September, states that her pain started months later.  Physical Exam   Triage Vital Signs: ED Triage Vitals  Encounter Vitals Group     BP 09/24/23 1803 (!) 114/98     Girls Systolic BP Percentile --      Girls Diastolic BP Percentile --      Boys Systolic BP Percentile --      Boys Diastolic BP Percentile --      Pulse Rate 09/24/23 1803 98     Resp 09/24/23 1803 20     Temp 09/24/23 1803 98.1 F (36.7 C)     Temp Source 09/24/23 1803 Oral     SpO2 09/24/23 1803 98 %     Weight --      Height --      Head Circumference --      Peak Flow --      Pain Score 09/24/23 1804 6     Pain Loc --      Pain Education --      Exclude from Growth Chart --     Most recent vital signs: Vitals:   09/24/23 2213 09/24/23 2233  BP:  129/72  Pulse:  83  Resp:  14  Temp: 97.8 F (36.6 C)   SpO2:  100%     General: Awake, no distress.  CV:  Good peripheral perfusion.  Resp:  Normal effort.  No tachypnea or respiratory distress Abd:  No distention.  Soft nontender Other:  Right lower extremity slightly larger than the left,  there is no pitting edema, palpable DP pulses bilaterally, no focal weakness or numbness.  Tenderness to her right calf.  Swelling does not extend past her knee.   ED Results / Procedures / Treatments   Labs (all labs ordered are listed, but only abnormal results are displayed) Labs Reviewed  BASIC METABOLIC PANEL WITH GFR - Abnormal; Notable for the following components:      Result Value   Glucose, Bld 110 (*)    All other components within normal limits  D-DIMER, QUANTITATIVE - Abnormal; Notable for the following components:   D-Dimer, Quant 1.10 (*)    All other components within normal limits  CBC  TROPONIN I (HIGH SENSITIVITY)     EKG  EKG shows, EKG shows sinus rhythm, rate 84, normal QS, normal QTc, no obvious ischemic ST elevation, T wave flattening in aVL, V2, T wave flattening in V2 is new compared to prior   RADIOLOGY On my independent interpretation, ultrasound without obvious DVT   PROCEDURES:  Critical Care performed: No  Procedures   MEDICATIONS ORDERED IN ED: Medications  iohexol  (OMNIPAQUE ) 350 MG/ML injection 100 mL (100 mLs Intravenous Contrast Given 09/24/23 2305)     IMPRESSION / MDM / ASSESSMENT AND PLAN / ED COURSE  I reviewed the triage vital signs and the nursing notes.                              Differential diagnosis includes, but is not limited to, lymphedema, ablation side effect, DVT.  DVT ultrasound and labs including D-dimer were obtained at triage, the D-dimer is elevated.  DVT ultrasound was negative.  I did consider PE but patient stated that she did not have any shortness of breath or chest pain.  After discussion, she did say that she was having some fluttering in her chest as well as palpitations that are intermittent.  Would like to proceed with a CT PE study given that her D-dimer is elevated.  Will add on the CT PE study as well as an EKG and troponin.  Patient's presentation is most consistent with acute presentation with  potential threat to life or bodily function.  Independent interpretation of labs and imaging below.  Patient signed out pending CT PE study, if negative, can be followed outpatient and discharged home.  The patient is on the cardiac monitor to evaluate for evidence of arrhythmia and/or significant heart rate changes.   Clinical Course as of 09/24/23 2312  Fri Sep 24, 2023  2312 US  Venous Img Lower Unilateral Right No evidence of deep venous thrombosis.  [TT]  2312 Independent review of labs, D-dimer is elevated, electrolytes not severely deranged, no leukocytosis. [TT]    Clinical Course User Index [TT] Waymond Lorelle Cummins, MD     FINAL CLINICAL IMPRESSION(S) / ED DIAGNOSES   Final diagnoses:  Right leg swelling  Palpitations     Rx / DC Orders   ED Discharge Orders     None        Note:  This document was prepared using Dragon voice recognition software and may include unintentional dictation errors.    Waymond Lorelle Cummins, MD 09/24/23 [id number]

## 2023-09-27 ENCOUNTER — Telehealth (INDEPENDENT_AMBULATORY_CARE_PROVIDER_SITE_OTHER): Payer: Self-pay

## 2023-09-27 ENCOUNTER — Ambulatory Visit: Admitting: Orthopedic Surgery

## 2023-09-27 NOTE — Telephone Encounter (Signed)
 The patient does have known lymphedema which may also be a cause for her swelling.  As previously discussed with the patient there is not necessarily a cure for lymphedema but you must engage in conservative therapy and management.  As far as the pain getting worse postprocedure, this is not necessarily a common occurrence however as previously discussed we suspect that this is not vascular in nature but possibly and likely nerve related.

## 2023-09-27 NOTE — Telephone Encounter (Signed)
 Patient left voicemail stating she had a venous ablation with Dr. Marea in September and she is experiencing increased swelling in Right leg although she is on a low dose diuretic. Patient states she has been elevating and icing at this time.  I returned patients phone call and patient stated her pain and discomfort has increased over the last 2 weeks, she is wearing her compression hose, elevating, icing with no relief at this time. She currently states at this time the pain is okay, but often reaches a 7. Ibuprofen  does provide a little relief. Patient had to call CMA back due to being at doctors office. On patient call back patient stated her symptoms gradually started getting worse post procedure, asked was this common, do other patients typically have temporary issues after an ablation? Please advise.

## 2023-10-07 ENCOUNTER — Encounter: Payer: Self-pay | Admitting: Gastroenterology

## 2023-10-10 NOTE — Progress Notes (Signed)
 [facility] [address] [place], [place] [address]  Pulmonary Sleep Medicine   Office Visit Note  Patient Name: Desiree Walker DOB: August 31, 1960 MRN [id number]    Chief Complaint: Obstructive Sleep Apnea visit  Brief History:  Desiree Walker is seen today for a 6 month follow up visit for APAP@ 5-20 cmH2O. The patient has a 1 year history of sleep apnea. Patient is using PAP nightly.  The patient feels rested after sleeping with PAP.  The patient reports benefiting from PAP use. Reported sleepiness is improved and the Epworth Sleepiness Score is 1 out of 24. The patient does not take naps. The patient complains of the following: pt will sometimes forget to wear PAP after using the bathroom in the middle of the night. Patient will attempt trying to remember to wear PAP more. The compliance download shows 73% compliance with an average use time of 6 hours 9 minutes. The AHI is 0.4.  The patient does  complain of limb movements disrupting sleep but this is mild. The patient continues to require PAP therapy in order to eliminate sleep apnea.  ROS  General: (-) fever, (-) chills, (-) night sweat Nose and Sinuses: (-) nasal stuffiness or itchiness, (-) postnasal drip, (-) nosebleeds, (-) sinus trouble. Mouth and Throat: (-) sore throat, (-) hoarseness. Neck: (-) swollen glands, (-) enlarged thyroid , (-) neck pain. Respiratory: - cough, - shortness of breath, - wheezing. Neurologic: - numbness, - tingling. Psychiatric: - anxiety, - depression   Current Medication: Outpatient Encounter Medications as of 10/11/2023  Medication Sig   EPINEPHrine 0.3 mg/0.3 mL IJ SOAJ injection INJECT INTO THIGH ONCE IF NEEDED FOR SEVERE ALLERGIC REACTION.   atorvastatin (LIPITOR) 10 MG tablet Take 10 mg by mouth daily.   BD INTEGRA SYRINGE 25G X 1 3 ML MISC USE 1 SYRINGE MONTHLY   cyanocobalamin (VITAMIN B12) 1000 MCG/ML injection Inject into the muscle.   DUPIXENT 300 MG/2ML prefilled syringe Inject  into the skin.   estradiol (ESTRACE) 0.5 MG tablet Take 0.5 mg by mouth daily.   gabapentin  (NEURONTIN ) 100 MG capsule Take by mouth.   hydrocortisone 2.5 % cream Apply topically 2 (two) times daily.   levothyroxine  (SYNTHROID ) 112 MCG tablet TAKE ONE TABLET DAILY. EVERY SUNDAY TAKE AN EXTRA ONE-HALF TABLET. (Patient taking differently: Take 112-168 mcg by mouth See admin instructions. Take 1.5 tablets (168 mcg) by mouth on Sundays & take 1 tablet (112 mcg) by mouth on all other days.)   losartan -hydrochlorothiazide (HYZAAR) 100-12.5 MG tablet Take 1 tablet by mouth daily.   spironolactone (ALDACTONE) 25 MG tablet Take 1 tablet by mouth daily.   [DISCONTINUED] aspirin EC 81 MG tablet Take 81 mg by mouth. (Patient not taking: Reported on 08/03/2023)   [DISCONTINUED] baclofen  5 MG TABS Take 1 tablet (5 mg total) by mouth at bedtime as needed for muscle spasms. (Patient not taking: Reported on 08/03/2023)   [DISCONTINUED] fluticasone  (FLONASE ) 50 MCG/ACT nasal spray  (Patient not taking: Reported on 08/03/2023)   [DISCONTINUED] predniSONE  (DELTASONE ) 20 MG tablet Take 2 tablets (40 mg total) by mouth daily. (Patient not taking: Reported on 08/03/2023)   [DISCONTINUED] tacrolimus (PROTOPIC) 0.1 % ointment Apply topically 2 (two) times daily.   Facility-Administered Encounter Medications as of 10/11/2023  Medication   betamethasone  acetate-betamethasone  sodium phosphate  (CELESTONE ) injection 3 mg    Surgical History: Past Surgical History:  Procedure Laterality Date   ANAL FISTULOTOMY N/A 11/30/2014   Procedure: ANAL FISTULOTOMY;  Surgeon: Elgin Laurence III, MD;  Location:  [facility] ORS;  Service: General;  Laterality: N/A;   BREAST BIOPSY Right 09/08/2018   affirm bx of mass, coil clip, ORGANIZING FAT NECROSIS AND ADJACENT CHANGES SUGGESTIVE OF RUPTURED CYST   BREAST BIOPSY Right 01/13/2021   PREDOMINANTLY BENIGN FIBROADIPOSE TISSUE SCANT FRAGMENTS OF UNREMARKABLE LYMPH NODE   CESAREAN SECTION      COLONOSCOPY  2014   COLONOSCOPY WITH PROPOFOL  N/A 08/03/2022   Procedure: COLONOSCOPY WITH PROPOFOL ;  Surgeon: Onita Elspeth Sharper, DO;  Location: [facility] ENDOSCOPY;  Service: Gastroenterology;  Laterality: N/A;   CYSTOSCOPY N/A 05/14/2020   Procedure: CYSTOSCOPY;  Surgeon: Arloa Lamar SQUIBB, MD;  Location: [facility] ORS;  Service: Gynecology;  Laterality: N/A;   DILATION AND CURETTAGE OF UTERUS     ENDOMETRIAL ABLATION W/ NOVASURE  05/29/2009   Dr Arloa   left foot surgery     RECTAL EXAM UNDER ANESTHESIA N/A 11/30/2014   Procedure: RECTAL EXAM UNDER ANESTHESIA;  Surgeon: Elgin Laurence III, MD;  Location: [facility] ORS;  Service: General;  Laterality: N/A;   TOTAL LAPAROSCOPIC HYSTERECTOMY WITH BILATERAL SALPINGO OOPHORECTOMY Bilateral 05/14/2020   LAPAROSCOPIC SUPRACERVICAL HYSTERECTOMY WITH BILATERAL SALPINGO OOPHORECTOMY;  Surgeon: Arloa Lamar SQUIBB, MD;  Location: [facility] ORS;  Service: Gynecology;  Laterality: Bilateral;    Medical History: Past Medical History:  Diagnosis Date   Anemia    H/O   Anxiety    Arthritis    BRCA negative 08/2018   MyRisk neg except NTHL1 VUS   Complication of anesthesia    Family history of breast cancer 08/2018   IBIS=8.65/riskscore=7.9%   Family history of ovarian cancer    MyRisk neg except NTHL1 VUS   GERD (gastroesophageal reflux disease)    NO MEDS   Hyperlipidemia    Hypertension    Hypothyroidism    Hashimoto's per patient   IFG (impaired fasting glucose) 08/05/2015   Metabolic syndrome 10/13/2016   Palpitations 2012   Evaluated by Timothy Gollan, MD, Holter moniter   PONV (postoperative nausea and vomiting)    Pre-diabetes    Rectal mass    Thyroid  disease    unspecified hypothyroidism    Family History: Non contributory to the present illness  Social History: Social History   Socioeconomic History   Marital status: Married    Spouse name: Lamar A. Salvetti   Number of children: 3   Years of education: Not on file   Highest education level:  Associate degree: occupational, Scientist, product/process development, or vocational program  Occupational History   Occupation: Programmer, systems Pediatrics  Tobacco Use   Smoking status: Never    Passive exposure: Never   Smokeless tobacco: Never  Vaping Use   Vaping status: Never Used  Substance and Sexual Activity   Alcohol use: No    Alcohol/week: 0.0 standard drinks of alcohol   Drug use: No   Sexual activity: Yes    Birth control/protection: Post-menopausal  Other Topics Concern   Not on file  Social History Narrative   Not on file   Social Drivers of Health   Financial Resource Strain: Low Risk  (07/12/2023)   Received from [facility]   Overall Financial Resource Strain (CARDIA)    Difficulty of Paying Living Expenses: Not hard at all  Food Insecurity: No Food Insecurity (07/12/2023)   [facility]   Hunger Vital Sign    Within the past 12 months, you worried that your food would run out before you got the money to buy more.: Never true  Within the past 12 months, the food you bought just didn't last and you didn't have money to get more.: Never true  Transportation Needs: No Transportation Needs (07/12/2023)   Received from [facility] - Transportation    In the past 12 months, has lack of transportation kept you from medical appointments or from getting medications?: No    Lack of Transportation (Non-Medical): No  Physical Activity: Inactive (09/21/2017)   Exercise Vital Sign    Days of Exercise per Week: 0 days    Minutes of Exercise per Session: 0 min  Stress: No Stress Concern Present (09/21/2017)   [employer] [facility] [employer] - Occupational Stress Questionnaire    Feeling of Stress : Not at all  Social Connections: Unknown (12/18/2022)   Received from [facility]   Social Network    Social Network: Not on file  Intimate Partner Violence: Unknown (12/18/2022)   Received from [facility]   HITS     Physically Hurt: Not on file    Insult or Talk Down To: Not on file    Threaten Physical Harm: Not on file    Scream or Curse: Not on file    Vital Signs: Blood pressure 114/74, pulse 89, resp. rate 16, height 5' 7 (1.702 m), weight 219 lb (99.3 kg), SpO2 98%. Body mass index is 34.3 kg/m.    Examination: General Appearance: The patient is well-developed, well-nourished, and in no distress. Neck Circumference: 42 cm Skin: Gross inspection of skin unremarkable. Head: normocephalic, no gross deformities. Eyes: no gross deformities noted. ENT: ears appear grossly normal Neurologic: Alert and oriented. No involuntary movements.  STOP BANG RISK ASSESSMENT S (snore) Have you been told that you snore?     NO   T (tired) Are you often tired, fatigued, or sleepy during the day?   NO  O (obstruction) Do you stop breathing, choke, or gasp during sleep? NO   P (pressure) Do you have or are you being treated for high blood pressure? YES   B (BMI) Is your body index greater than 35 kg/m? YES   A (age) Are you 53 years old or older? YES   N (neck) Do you have a neck circumference greater than 16 inches?   YES   G (gender) Are you a female? NO   TOTAL STOP/BANG "YES" ANSWERS 4       A STOP-Bang score of 2 or less is considered low risk, and a score of 5 or more is high risk for having either moderate or severe OSA. For people who score 3 or 4, doctors may need to perform further assessment to determine how likely they are to have OSA.         EPWORTH SLEEPINESS SCALE:  Scale:  (0)= no chance of dozing; (1)= slight chance of dozing; (2)= moderate chance of dozing; (3)= high chance of dozing  Chance  Situtation    Sitting and reading: 0    Watching TV: 0    Sitting Inactive in public: 0    As a passenger in car: 0      Lying down to rest: 1    Sitting and talking: 0    Sitting quielty after lunch: 0    In a car, stopped in traffic: 0   TOTAL SCORE:   1 out of  24    SLEEP STUDIES:   HST (06/29/2022) AHI 11/hr, min SP02 82.5%   CPAP COMPLIANCE DATA:  Date  Range: 03/08/2023-10/06/2023  Average Daily Use: 6 hours 9 minutes  Median Use: 6 hours 18 minutes  Compliance for > 4 Hours: 73%  AHI: 0.4 respiratory events per hour  Days Used: 199/213 days  Mask Leak: 4.5  95th Percentile Pressure: 7.3         LABS: Recent Results (from the past 2160 hours)  CBC     Status: None   Collection Time: 09/24/23  6:07 PM  Result Value Ref Range   WBC 8.3 4.0 - 10.5 K/uL   RBC 5.03 3.87 - 5.11 MIL/uL   Hemoglobin 13.5 12.0 - 15.0 g/dL   HCT 58.7 63.9 - 53.9 %   MCV 81.9 80.0 - 100.0 fL   MCH 26.8 26.0 - 34.0 pg   MCHC 32.8 30.0 - 36.0 g/dL   RDW 86.6 88.4 - 84.4 %   Platelets 247 150 - 400 K/uL   nRBC 0.0 0.0 - 0.2 %    Comment: Performed at [facility], [address] [place], [place] [address]  Basic metabolic panel     Status: Abnormal   Collection Time: 09/24/23  6:07 PM  Result Value Ref Range   Sodium 139 135 - 145 mmol/L   Potassium 4.1 3.5 - 5.1 mmol/L   Chloride 104 98 - 111 mmol/L   CO2 25 22 - 32 mmol/L   Glucose, Bld 110 (H) 70 - 99 mg/dL    Comment: Glucose reference range applies only to samples taken after fasting for at least 8 hours.   BUN 23 8 - 23 mg/dL   Creatinine, Ser 9.12 0.44 - 1.00 mg/dL   Calcium 89.9 8.9 - 89.6 mg/dL   GFR, Estimated >39 >39 mL/min    Comment: (NOTE) Calculated using the CKD-EPI Creatinine Equation (2021)    Anion gap 10 5 - 15    Comment: Performed at [facility], [address] [place], [place] [address]  D-dimer, quantitative     Status: Abnormal   Collection Time: 09/24/23  6:07 PM  Result Value Ref Range   D-Dimer, Quant 1.10 (H) 0.00 - 0.50 ug/mL-FEU    Comment: (NOTE) At the manufacturer cut-off value of 0.5 g/mL FEU, this assay has a negative predictive value of 95-100%.This assay is intended for use in conjunction with a clinical pretest  probability (PTP) assessment model to exclude pulmonary embolism (PE) and deep venous thrombosis (DVT) in outpatients suspected of PE or DVT. Results should be correlated with clinical presentation. Performed at [facility], [address] [place], [place] [address]   Troponin I (High Sensitivity)     Status: None   Collection Time: 09/24/23  6:07 PM  Result Value Ref Range   Troponin I (High Sensitivity) 4 <18 ng/L    Comment: (NOTE) Elevated high sensitivity troponin I (hsTnI) values and significant  changes across serial measurements may suggest ACS but many other  chronic and acute conditions are known to elevate hsTnI results.  Refer to the Links section for chest pain algorithms and additional  guidance. Performed at [facility], [address], [place], [place] [address]     Radiology: CT Angio Chest PE W/Cm &/Or Wo Cm Result Date: 09/24/2023 CLINICAL DATA:  Positive D-dimer. EXAM: CT ANGIOGRAPHY CHEST WITH CONTRAST TECHNIQUE: Multidetector CT imaging of the chest was performed using the standard protocol during bolus administration of intravenous contrast. Multiplanar CT image reconstructions and MIPs were obtained to evaluate the vascular anatomy. RADIATION DOSE REDUCTION: This exam was performed  according to the departmental dose-optimization program which includes automated exposure control, adjustment of the mA and/or kV according to patient size and/or use of iterative reconstruction technique. CONTRAST:  OMNIPAQUE  IOHEXOL  350 MG/ML SOLN COMPARISON:  CT of the chest 12/12/2021. FINDINGS: Cardiovascular: Satisfactory opacification of the pulmonary arteries to the segmental level. No evidence of pulmonary embolism. Normal heart size. No pericardial effusion. Mediastinum/Nodes: No enlarged mediastinal, hilar, or axillary lymph nodes. Thyroid  gland, trachea, and esophagus demonstrate no significant findings. There is a small hiatal hernia. Lungs/Pleura:  Lungs are clear. No pleural effusion or pneumothorax. Upper Abdomen: No acute abnormality. Musculoskeletal: No chest wall abnormality. No acute or significant osseous findings. Review of the MIP images confirms the above findings. IMPRESSION: 1. No evidence for pulmonary embolism or other acute cardiopulmonary process. 2. Small hiatal hernia. Electronically Signed   By: Greig Pique M.D.   On: 09/24/2023 23:22   US  Venous Img Lower Unilateral Right Result Date: 09/24/2023 CLINICAL DATA:  Right leg pain and swelling, history of prior venous ablation EXAM: RIGHT LOWER EXTREMITY VENOUS DOPPLER ULTRASOUND TECHNIQUE: Gray-scale sonography with graded compression, as well as color Doppler and duplex ultrasound were performed to evaluate the lower extremity deep venous systems from the level of the common femoral vein and including the common femoral, femoral, profunda femoral, popliteal and calf veins including the posterior tibial, peroneal and gastrocnemius veins when visible. The superficial great saphenous vein was also interrogated. Spectral Doppler was utilized to evaluate flow at rest and with distal augmentation maneuvers in the common femoral, femoral and popliteal veins. COMPARISON:  None Available. FINDINGS: Contralateral Common Femoral Vein: Respiratory phasicity is normal and symmetric with the symptomatic side. No evidence of thrombus. Normal compressibility. Common Femoral Vein: No evidence of thrombus. Normal compressibility, respiratory phasicity and response to augmentation. Saphenofemoral Junction: No evidence of thrombus. Normal compressibility and flow on color Doppler imaging. Profunda Femoral Vein: No evidence of thrombus. Normal compressibility and flow on color Doppler imaging. Femoral Vein: No evidence of thrombus. Normal compressibility, respiratory phasicity and response to augmentation. Popliteal Vein: No evidence of thrombus. Normal compressibility, respiratory phasicity and response to  augmentation. Calf Veins: No evidence of thrombus. Normal compressibility and flow on color Doppler imaging. Superficial Great Saphenous Vein: No evidence of thrombus. Normal compressibility. Venous Reflux:  None. Other Findings:  None. IMPRESSION: No evidence of deep venous thrombosis. Electronically Signed   By: Oneil Devonshire M.D.   On: 09/24/2023 19:08    No results found.  CT Angio Chest PE W/Cm &/Or Wo Cm Result Date: 09/24/2023 CLINICAL DATA:  Positive D-dimer. EXAM: CT ANGIOGRAPHY CHEST WITH CONTRAST TECHNIQUE: Multidetector CT imaging of the chest was performed using the standard protocol during bolus administration of intravenous contrast. Multiplanar CT image reconstructions and MIPs were obtained to evaluate the vascular anatomy. RADIATION DOSE REDUCTION: This exam was performed according to the departmental dose-optimization program which includes automated exposure control, adjustment of the mA and/or kV according to patient size and/or use of iterative reconstruction technique. CONTRAST:  OMNIPAQUE  IOHEXOL  350 MG/ML SOLN COMPARISON:  CT of the chest 12/12/2021. FINDINGS: Cardiovascular: Satisfactory opacification of the pulmonary arteries to the segmental level. No evidence of pulmonary embolism. Normal heart size. No pericardial effusion. Mediastinum/Nodes: No enlarged mediastinal, hilar, or axillary lymph nodes. Thyroid  gland, trachea, and esophagus demonstrate no significant findings. There is a small hiatal hernia. Lungs/Pleura: Lungs are clear. No pleural effusion or pneumothorax. Upper Abdomen: No acute abnormality. Musculoskeletal: No chest wall abnormality. No acute or significant  osseous findings. Review of the MIP images confirms the above findings. IMPRESSION: 1. No evidence for pulmonary embolism or other acute cardiopulmonary process. 2. Small hiatal hernia. Electronically Signed   By: Greig Pique M.D.   On: 09/24/2023 23:22   US  Venous Img Lower Unilateral Right Result  Date: 09/24/2023 CLINICAL DATA:  Right leg pain and swelling, history of prior venous ablation EXAM: RIGHT LOWER EXTREMITY VENOUS DOPPLER ULTRASOUND TECHNIQUE: Gray-scale sonography with graded compression, as well as color Doppler and duplex ultrasound were performed to evaluate the lower extremity deep venous systems from the level of the common femoral vein and including the common femoral, femoral, profunda femoral, popliteal and calf veins including the posterior tibial, peroneal and gastrocnemius veins when visible. The superficial great saphenous vein was also interrogated. Spectral Doppler was utilized to evaluate flow at rest and with distal augmentation maneuvers in the common femoral, femoral and popliteal veins. COMPARISON:  None Available. FINDINGS: Contralateral Common Femoral Vein: Respiratory phasicity is normal and symmetric with the symptomatic side. No evidence of thrombus. Normal compressibility. Common Femoral Vein: No evidence of thrombus. Normal compressibility, respiratory phasicity and response to augmentation. Saphenofemoral Junction: No evidence of thrombus. Normal compressibility and flow on color Doppler imaging. Profunda Femoral Vein: No evidence of thrombus. Normal compressibility and flow on color Doppler imaging. Femoral Vein: No evidence of thrombus. Normal compressibility, respiratory phasicity and response to augmentation. Popliteal Vein: No evidence of thrombus. Normal compressibility, respiratory phasicity and response to augmentation. Calf Veins: No evidence of thrombus. Normal compressibility and flow on color Doppler imaging. Superficial Great Saphenous Vein: No evidence of thrombus. Normal compressibility. Venous Reflux:  None. Other Findings:  None. IMPRESSION: No evidence of deep venous thrombosis. Electronically Signed   By: Oneil Devonshire M.D.   On: 09/24/2023 19:08      Assessment and Plan: Patient Active Problem List   Diagnosis Date Noted   OSA (obstructive  sleep apnea) 04/12/2023   CPAP use counseling 04/12/2023   Varicose veins of leg with pain, right 03/27/2022   Hypothyroidism due to Hashimoto's thyroiditis 06/12/2020   IGT (impaired glucose tolerance) 06/12/2020   Irritant contact dermatitis 06/12/2020   S/P laparoscopic supracervical hysterectomy 05/27/2020   Fibroid uterus 05/14/2020   Rotator cuff tendinitis, right 11/06/2019   Traumatic complete tear of right rotator cuff 11/06/2019   COVID-19 virus infection 03/14/2019   S/P breast biopsy, right 01/12/2019   Breast pain, right 01/12/2019   Family history of breast cancer 06/02/2018   Family history of ovarian cancer 06/02/2018   Eczema 06/10/2017   History of uterine fibroid 10/23/2016   Metabolic syndrome 10/13/2016   Medication monitoring encounter 10/13/2016   Pressure in chest 12/17/2015   Anxiety disorder 12/17/2015   Obesity 12/17/2015   Sleep apnea 08/29/2015   Snoring 08/27/2015   Vitamin D  deficiency 08/19/2015   Leg cramps 08/05/2015   IFG (impaired fasting glucose) 08/05/2015   Anal fistula    Other specified symptoms and signs involving the digestive system and abdomen 10/31/2014   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 OSA (obstructive sleep apnea) (Primary)  The patient does tolerate PAP and reports  benefit from PAP use. The patient was reminded how to clean equipment and advised to replace supplies routinely. The patient was also counselled on weight  loss. The compliance is good. The AHI is 0.4.   OSA on cpap- controlled. Continue with compliance with pap. CPAP continues to be medically necessary to treat this patient's OSA. F/u one year.   2. CPAP use counseling CPAP Counseling: had a lengthy discussion with the patient regarding the importance of PAP therapy in management of the sleep apnea. Patient appears to understand the risk factor reduction and also understands the risks associated with untreated sleep apnea. Patient will try to make a good faith effort to remain compliant with therapy. Also instructed the patient on proper cleaning of the device including the water must be changed daily if possible and use of distilled water is preferred. Patient understands that the machine should be regularly cleaned with appropriate recommended cleaning solutions that do not damage the PAP machine for example given white vinegar and water rinses. Other methods such as ozone treatment may not be as good as these simple methods to achieve cleaning.      General Counseling: I have discussed the findings of the evaluation and examination with Zebedee.  I have also discussed any further diagnostic evaluation thatmay be needed or ordered today. Kristain verbalizes understanding of the findings of todays visit. We also reviewed her medications today and discussed drug interactions and side effects including but not limited excessive drowsiness and altered mental states. We also discussed that there is always a risk not just to her but also people around her. she has been encouraged to call the office with any questions or concerns that should arise related to todays visit.  No orders of the defined types were placed in this encounter.       I have personally obtained a history, examined the patient, evaluated laboratory and imaging results, formulated the assessment and plan and placed orders. This patient was seen today by Lauraine Lay, PA-C in  collaboration with Dr. Elfreda Bathe.   Elfreda DELENA Bathe, MD [facility] Diplomate ABMS Pulmonary Critical Care Medicine and Sleep Medicine

## 2023-10-11 ENCOUNTER — Ambulatory Visit (INDEPENDENT_AMBULATORY_CARE_PROVIDER_SITE_OTHER): Admitting: Internal Medicine

## 2023-10-11 ENCOUNTER — Telehealth: Payer: Self-pay | Admitting: Orthopedic Surgery

## 2023-10-11 VITALS — BP 114/74 | HR 89 | Resp 16 | Ht 67.0 in | Wt 219.0 lb

## 2023-10-11 DIAGNOSIS — Z7189 Other specified counseling: Secondary | ICD-10-CM | POA: Diagnosis not present

## 2023-10-11 DIAGNOSIS — G4733 Obstructive sleep apnea (adult) (pediatric): Secondary | ICD-10-CM

## 2023-10-11 NOTE — Patient Instructions (Signed)

## 2023-10-11 NOTE — Telephone Encounter (Signed)
 Patient is calling to ask your thoughts on seeing a Chiropractor. She has an appointment scheduled for tomorrow, but would like your opinion.  She states that you can reply back to her through MyChart if it is easier.

## 2023-10-12 NOTE — Telephone Encounter (Signed)
 Addressed in My Chart message

## 2023-10-15 ENCOUNTER — Encounter: Payer: Self-pay | Admitting: Podiatry

## 2023-10-15 ENCOUNTER — Ambulatory Visit

## 2023-10-15 ENCOUNTER — Ambulatory Visit: Admitting: Podiatry

## 2023-10-15 VITALS — Ht 67.0 in | Wt 219.0 lb

## 2023-10-15 DIAGNOSIS — M7752 Other enthesopathy of left foot: Secondary | ICD-10-CM

## 2023-10-15 DIAGNOSIS — M2142 Flat foot [pes planus] (acquired), left foot: Secondary | ICD-10-CM | POA: Diagnosis not present

## 2023-10-15 NOTE — Progress Notes (Signed)
 HPI: 63 y.o. female presenting today for evaluation of left flatfoot deformity.  History of subtalar and first TMT arthrodesis.  Recently she has been experiencing some lower back pain.  She would like to have her foot evaluated to see if it is stable and possibly contributing to her back pain  Past Medical History:  Diagnosis Date   Anemia    H/O   Anxiety    Arthritis    BRCA negative 08/2018   MyRisk neg except NTHL1 VUS   Complication of anesthesia    Family history of breast cancer 08/2018   IBIS=8.65/riskscore=7.9%   Family history of ovarian cancer    MyRisk neg except NTHL1 VUS   GERD (gastroesophageal reflux disease)    NO MEDS   Hyperlipidemia    Hypertension    Hypothyroidism    Hashimoto's per patient   IFG (impaired fasting glucose) 08/05/2015   Metabolic syndrome 10/13/2016   Palpitations 2012   Evaluated by Timothy Gollan, MD, Holter moniter   PONV (postoperative nausea and vomiting)    Pre-diabetes    Rectal mass    Thyroid  disease    unspecified hypothyroidism   Past Surgical History:  Procedure Laterality Date   ANAL FISTULOTOMY N/A 11/30/2014   Procedure: ANAL FISTULOTOMY;  Surgeon: Elgin Laurence III, MD;  Location: [facility] ORS;  Service: General;  Laterality: N/A;   BREAST BIOPSY Right 09/08/2018   affirm bx of mass, coil clip, ORGANIZING FAT NECROSIS AND ADJACENT CHANGES SUGGESTIVE OF RUPTURED CYST   BREAST BIOPSY Right 01/13/2021   PREDOMINANTLY BENIGN FIBROADIPOSE TISSUE SCANT FRAGMENTS OF UNREMARKABLE LYMPH NODE   CESAREAN SECTION     COLONOSCOPY  2014   COLONOSCOPY WITH PROPOFOL  N/A 08/03/2022   Procedure: COLONOSCOPY WITH PROPOFOL ;  Surgeon: Onita Elspeth Sharper, DO;  Location: [facility] ENDOSCOPY;  Service: Gastroenterology;  Laterality: N/A;   CYSTOSCOPY N/A 05/14/2020   Procedure: CYSTOSCOPY;  Surgeon: Arloa Lamar SQUIBB, MD;  Location: [facility] ORS;  Service: Gynecology;  Laterality: N/A;   DILATION AND CURETTAGE OF UTERUS     ENDOMETRIAL ABLATION W/  NOVASURE  05/29/2009   Dr Arloa   left foot surgery     RECTAL EXAM UNDER ANESTHESIA N/A 11/30/2014   Procedure: RECTAL EXAM UNDER ANESTHESIA;  Surgeon: Elgin Laurence III, MD;  Location: [facility] ORS;  Service: General;  Laterality: N/A;   TOTAL LAPAROSCOPIC HYSTERECTOMY WITH BILATERAL SALPINGO OOPHORECTOMY Bilateral 05/14/2020   LAPAROSCOPIC SUPRACERVICAL HYSTERECTOMY WITH BILATERAL SALPINGO OOPHORECTOMY;  Surgeon: Arloa Lamar SQUIBB, MD;  Location: [facility] ORS;  Service: Gynecology;  Laterality: Bilateral;   Allergies  Allergen Reactions   Latex Rash   Amlodipine     Other reaction(s): Other (See Comments) Ankle swelling   Oxycodone  Nausea And Vomiting    No problems with Hydrocodone    Rosuvastatin Nausea Only   Scopolamine Other (See Comments)    FELT LIKE I COULDN'T BREATHE    Physical Exam: General: The patient is alert and oriented x3 in no acute distress.  Dermatology: Skin is warm, dry and supple bilateral lower extremities. Negative for open lesions or macerations.    Vascular: Palpable pedal pulses bilaterally. No edema or erythema noted. Capillary refill within normal limits.  Neurological: Grossly intact via light touch  Musculoskeletal Exam: Range of motion within normal limits to all pedal and ankle joints bilateral. Muscle strength 5/5 in all groups bilateral.  Negative range of motion to the subtalar joint.  No pain throughout palpation of the foot today.  Foot is very stable  Assessment:  1.  H/o isolated subtalar joint arthrodesis left 2.  Pes planovalgus deformity left 3.  History of lumbar back pain   Plan of Care:  -Patient evaluated.  X-rays reviewed -Explained to the patient that x-rays are stable.  Her history of prior surgery and flatfoot deformity may be a contributor to her lower back pain however recommend conservative treatment which she is already doing -She has tried orthotics in the past without success. -Continue good supportive tennis shoes; currently  wearing [employer] from [employer] -Return to clinic PRN       Thresa EMERSON Sar, DPM [facility]  Dr. Thresa EMERSON Sar, DPM    [address] [place], [place] [address]                Office [phone]  Fax [phone]

## 2023-10-21 ENCOUNTER — Ambulatory Visit: Admission: RE | Admit: 2023-10-21 | Source: Home / Self Care | Admitting: Gastroenterology

## 2023-10-21 ENCOUNTER — Encounter: Admission: RE | Payer: Self-pay | Source: Home / Self Care

## 2023-10-21 SURGERY — EGD (ESOPHAGOGASTRODUODENOSCOPY)
Anesthesia: General

## 2023-10-22 NOTE — Progress Notes (Deleted)
 Referring Physician:  Steva Clotilda DEL, NP [address] [place],  [place] [address]  Primary Physician:  Steva Clotilda DEL, NP  History of Present Illness: [name] has a history of HTN, varicose veins, sleep apnea, hypothyroidism due to Hashimoto's, hyperlipidemia.   Last seen by me on 07/22/23 and she was doing well with no LBP and intermittent numbness in right foot. She has known mild lumbar spondylosis with diffuse mild DDD. She has mild bilateral foraminal stenosis L3-L5. No central or foraminal stenosis seen at L5-S1, but she has disc bulge/facet hypertrophy.   She was to continue with PT for lumbar spine and to follow up with vascular- Dr. Marea agreed with evaluation with neurology.   She saw Dr. Lane on 08/16/23- he started her on neurontin  and scheduled an EMG of her right leg.   ???She did not see Dr. Dodson on 09/23/23 for her back pain. ***  She is here for follow up.      Given prednisone  taper by PCP on 10/14/23.   only minimal soreness in her lower back and left leg. She has known mild lumbar spondylosis with diffuse mild DDD. She has mild bilateral foraminal stenosis L3-L5. No central or foraminal stenosis seen at L5-S1, but she has disc bulge/facet hypertrophy.   She was to continue with PT at last visit. She has been seeing vascular as well.   She is here for follow up.   She has no current LBP, only minimal soreness. She is worried she will hurt her back with bending. She notes intermittent numbness in right foot. She has occasional right posterior leg pain. She is wearing compression hose. No weakness.   She is on baclofen .    Conservative measures:  Physical therapy: discharged on 01/21/23 after 5 visits as her back pain was improved- she is back in PT now at [facility] in [place]- has done 2 visits.  Multimodal medical therapy including regular antiinflammatories: tylenol , motrin   Injections: No epidural steroid injections  Past Surgery: No  spine surgery  GRASIELA JONSSON has no symptoms of cervical myelopathy.  The symptoms are causing a significant impact on the patient's life.   Review of Systems:  A 10 point review of systems is negative, except for the pertinent positives and negatives detailed in the HPI.  Past Medical History: Past Medical History:  Diagnosis Date   Anemia    H/O   Anxiety    Arthritis    BRCA negative 08/2018   MyRisk neg except NTHL1 VUS   Complication of anesthesia    Family history of breast cancer 08/2018   IBIS=8.65/riskscore=7.9%   Family history of ovarian cancer    MyRisk neg except NTHL1 VUS   GERD (gastroesophageal reflux disease)    NO MEDS   Hyperlipidemia    Hypertension    Hypothyroidism    Hashimoto's per patient   IFG (impaired fasting glucose) 08/05/2015   Metabolic syndrome 10/13/2016   Palpitations 2012   Evaluated by Timothy Gollan, MD, Holter moniter   PONV (postoperative nausea and vomiting)    Pre-diabetes    Rectal mass    Thyroid  disease    unspecified hypothyroidism    Past Surgical History: Past Surgical History:  Procedure Laterality Date   ANAL FISTULOTOMY N/A 11/30/2014   Procedure: ANAL FISTULOTOMY;  Surgeon: Elgin Laurence III, MD;  Location: [facility] ORS;  Service: General;  Laterality: N/A;   BREAST BIOPSY Right 09/08/2018   affirm bx of mass, coil clip,  ORGANIZING FAT NECROSIS AND ADJACENT CHANGES SUGGESTIVE OF RUPTURED CYST   BREAST BIOPSY Right 01/13/2021   PREDOMINANTLY BENIGN FIBROADIPOSE TISSUE SCANT FRAGMENTS OF UNREMARKABLE LYMPH NODE   CESAREAN SECTION     COLONOSCOPY  2014   COLONOSCOPY WITH PROPOFOL  N/A 08/03/2022   Procedure: COLONOSCOPY WITH PROPOFOL ;  Surgeon: Onita Elspeth Sharper, DO;  Location: [facility] ENDOSCOPY;  Service: Gastroenterology;  Laterality: N/A;   CYSTOSCOPY N/A 05/14/2020   Procedure: CYSTOSCOPY;  Surgeon: Arloa Lamar SQUIBB, MD;  Location: [facility] ORS;  Service: Gynecology;  Laterality: N/A;   DILATION AND CURETTAGE OF UTERUS      ENDOMETRIAL ABLATION W/ NOVASURE  05/29/2009   Dr Arloa   left foot surgery     RECTAL EXAM UNDER ANESTHESIA N/A 11/30/2014   Procedure: RECTAL EXAM UNDER ANESTHESIA;  Surgeon: Elgin Laurence III, MD;  Location: [facility] ORS;  Service: General;  Laterality: N/A;   TOTAL LAPAROSCOPIC HYSTERECTOMY WITH BILATERAL SALPINGO OOPHORECTOMY Bilateral 05/14/2020   LAPAROSCOPIC SUPRACERVICAL HYSTERECTOMY WITH BILATERAL SALPINGO OOPHORECTOMY;  Surgeon: Arloa Lamar SQUIBB, MD;  Location: [facility] ORS;  Service: Gynecology;  Laterality: Bilateral;    Allergies: Allergies as of 11/02/2023 - Review Complete 10/15/2023  Allergen Reaction Noted   Latex Rash 06/05/2022   Amlodipine  10/06/2021   Oxycodone  Nausea And Vomiting 05/06/2021   Rosuvastatin Nausea Only 05/06/2021   Scopolamine Other (See Comments) 11/22/2014    Medications: Outpatient Encounter Medications as of 11/02/2023  Medication Sig   atorvastatin (LIPITOR) 10 MG tablet Take 10 mg by mouth daily.   BD INTEGRA SYRINGE 25G X 1 3 ML MISC USE 1 SYRINGE MONTHLY   cyanocobalamin (VITAMIN B12) 1000 MCG/ML injection Inject into the muscle.   DUPIXENT 300 MG/2ML prefilled syringe Inject into the skin.   EPINEPHrine 0.3 mg/0.3 mL IJ SOAJ injection INJECT INTO THIGH ONCE IF NEEDED FOR SEVERE ALLERGIC REACTION.   estradiol (ESTRACE) 0.5 MG tablet Take 0.5 mg by mouth daily.   gabapentin  (NEURONTIN ) 100 MG capsule Take by mouth.   hydrocortisone 2.5 % cream Apply topically 2 (two) times daily.   levothyroxine  (SYNTHROID ) 112 MCG tablet TAKE ONE TABLET DAILY. EVERY SUNDAY TAKE AN EXTRA ONE-HALF TABLET. (Patient taking differently: Take 112-168 mcg by mouth See admin instructions. Take 1.5 tablets (168 mcg) by mouth on Sundays & take 1 tablet (112 mcg) by mouth on all other days.)   losartan -hydrochlorothiazide (HYZAAR) 100-12.5 MG tablet Take 1 tablet by mouth daily.   spironolactone (ALDACTONE) 25 MG tablet Take 1 tablet by mouth daily.   Facility-Administered  Encounter Medications as of 11/02/2023  Medication   betamethasone  acetate-betamethasone  sodium phosphate  (CELESTONE ) injection 3 mg    Social History: Social History   Tobacco Use   Smoking status: Never    Passive exposure: Never   Smokeless tobacco: Never  Vaping Use   Vaping status: Never Used  Substance Use Topics   Alcohol use: No    Alcohol/week: 0.0 standard drinks of alcohol   Drug use: No    Family Medical History: Family History  Problem Relation Age of Onset   Hypertension Mother    Heart disease Mother 80       blockages   Alzheimer's disease Mother    Diverticulitis Father    Hernia Father    Depression Father    Alzheimer's disease Father    Hypothyroidism Sister    Uterine cancer Sister 36   Hypothyroidism Sister    Ovarian cancer Sister    Hypertension Brother    Stroke Maternal Grandfather  Heart attack Maternal Grandfather    Stroke Paternal Grandmother    Hypothyroidism Daughter    Hypothyroidism Daughter    Breast cancer Maternal Aunt 74   Breast cancer Paternal Aunt 40   Ovarian cancer Paternal Aunt    Breast cancer Other 30   Pancreatic cancer Maternal Uncle    Colon cancer Maternal Uncle     Physical Examination: There were no vitals filed for this visit.  Awake, alert, oriented to person, place, and time.  Speech is clear and fluent. Fund of knowledge is appropriate.   Cranial Nerves: Pupils equal round and reactive to light.  Facial tone is symmetric.    No abnormal lesions on exposed skin.   Strength: Side Iliopsoas Quads Hamstring PF DF EHL  R 5 5 5 5 5 5   L 5 5 5 5 5 5    Reflexes are 2+ and symmetric at the patella and achilles.     Clonus is not present.   Bilateral lower extremity sensation is intact to light touch.     She walks with a chronic limp since left foot surgery years ago.   Medical Decision Making  Imaging: none   Assessment and Plan: Ms. Storr has no current LBP, only minimal soreness. She notes  intermittent numbness in right foot. She has occasional right posterior leg pain. No weakness.   She has known mild lumbar spondylosis with diffuse mild DDD. She has mild bilateral foraminal stenosis L3-L5. No central or foraminal stenosis seen at L5-S1, but she has disc bulge/facet hypertrophy.   She is seeing vascular as well for her right leg pain.   Treatment options discussed with patient and following plan made:   - Continue with PT for lumbar spine. Orders to [facility] in [place].  - If right leg pain gets symptoms get worse, consider referral for injections. May also consider EMG/NCS of lower extremities.  - Continue to follow with vascular. She thinks compression hose are making her worse.  - Follow up with me in 2 months. Can cancel if doing well.   BP was elevated. No symptoms of chest pain, shortness of breath, blurry vision, or headaches. Will recheck at home and call PCP if not improved. If she develops CP, SOB, blurry vision, or headaches, then she will go to ED.     I spent a total of 20 minutes in face-to-face and non-face-to-face activities related to this patient's care today including review of outside records, review of imaging, review of symptoms, physical exam, discussion of differential diagnosis, discussion of treatment options, and documentation.   Glade Boys PA-C Dept. of Neurosurgery

## 2023-11-02 ENCOUNTER — Ambulatory Visit: Admitting: Orthopedic Surgery

## 2023-11-10 ENCOUNTER — Other Ambulatory Visit: Payer: Self-pay | Admitting: Neurology

## 2023-11-10 DIAGNOSIS — M792 Neuralgia and neuritis, unspecified: Secondary | ICD-10-CM

## 2023-11-10 DIAGNOSIS — R202 Paresthesia of skin: Secondary | ICD-10-CM

## 2023-11-10 DIAGNOSIS — R200 Anesthesia of skin: Secondary | ICD-10-CM

## 2023-11-13 ENCOUNTER — Encounter (INDEPENDENT_AMBULATORY_CARE_PROVIDER_SITE_OTHER): Payer: Self-pay | Admitting: Vascular Surgery

## 2023-11-16 NOTE — Telephone Encounter (Signed)
Please answer.

## 2023-11-18 ENCOUNTER — Other Ambulatory Visit

## 2023-11-19 ENCOUNTER — Inpatient Hospital Stay: Admission: RE | Admit: 2023-11-19 | Source: Ambulatory Visit

## 2023-12-01 ENCOUNTER — Encounter: Payer: Self-pay | Admitting: Occupational Therapy

## 2023-12-01 ENCOUNTER — Ambulatory Visit: Attending: Gerontology | Admitting: Occupational Therapy

## 2023-12-01 DIAGNOSIS — I890 Lymphedema, not elsewhere classified: Secondary | ICD-10-CM | POA: Diagnosis present

## 2023-12-01 NOTE — Therapy (Unsigned)
 OUTPATIENT OCCUPATIONALTHERAPY EVALUATION  RLE LOWER EXTREMITY LYMPHEDEMA  Patient Name: Desiree Walker MRN: [id number] DOB:1960-11-23, 63 y.o., female Today's Date: 12/01/2023  END OF SESSION:   OT End of Session - 12/01/23 0826     Visit Number 1    Number of Visits 1    Date for OT Re-Evaluation 12/01/23   Eval and DC same date   OT Start Time 0803    OT Stop Time 0903    OT Time Calculation (min) 60 min    Activity Tolerance Patient tolerated treatment well;No increased pain    Behavior During Therapy [facility] for tasks assessed/performed   verbose             Past Medical History:  Diagnosis Date   Anemia    H/O   Anxiety    Arthritis    BRCA negative 08/2018   MyRisk neg except NTHL1 VUS   Complication of anesthesia    Family history of breast cancer 08/2018   IBIS=8.65/riskscore=7.9%   Family history of ovarian cancer    MyRisk neg except NTHL1 VUS   GERD (gastroesophageal reflux disease)    NO MEDS   Hyperlipidemia    Hypertension    Hypothyroidism    Hashimoto's per patient   IFG (impaired fasting glucose) 08/05/2015   Metabolic syndrome 10/13/2016   Palpitations 2012   Evaluated by Timothy Gollan, MD, Holter moniter   PONV (postoperative nausea and vomiting)    Pre-diabetes    Rectal mass    Thyroid  disease    unspecified hypothyroidism   Past Surgical History:  Procedure Laterality Date   ANAL FISTULOTOMY N/A 11/30/2014   Procedure: ANAL FISTULOTOMY;  Surgeon: Elgin Laurence III, MD;  Location: [facility] ORS;  Service: General;  Laterality: N/A;   BREAST BIOPSY Right 09/08/2018   affirm bx of mass, coil clip, ORGANIZING FAT NECROSIS AND ADJACENT CHANGES SUGGESTIVE OF RUPTURED CYST   BREAST BIOPSY Right 01/13/2021   PREDOMINANTLY BENIGN FIBROADIPOSE TISSUE SCANT FRAGMENTS OF UNREMARKABLE LYMPH NODE   CESAREAN SECTION     COLONOSCOPY  2014   COLONOSCOPY WITH PROPOFOL  N/A 08/03/2022   Procedure: COLONOSCOPY WITH PROPOFOL ;  Surgeon: Onita Elspeth Sharper, DO;   Location: [facility] ENDOSCOPY;  Service: Gastroenterology;  Laterality: N/A;   CYSTOSCOPY N/A 05/14/2020   Procedure: CYSTOSCOPY;  Surgeon: Arloa Lamar SQUIBB, MD;  Location: [facility] ORS;  Service: Gynecology;  Laterality: N/A;   DILATION AND CURETTAGE OF UTERUS     ENDOMETRIAL ABLATION W/ NOVASURE  05/29/2009   Dr Arloa   left foot surgery     RECTAL EXAM UNDER ANESTHESIA N/A 11/30/2014   Procedure: RECTAL EXAM UNDER ANESTHESIA;  Surgeon: Elgin Laurence III, MD;  Location: [facility] ORS;  Service: General;  Laterality: N/A;   TOTAL LAPAROSCOPIC HYSTERECTOMY WITH BILATERAL SALPINGO OOPHORECTOMY Bilateral 05/14/2020   LAPAROSCOPIC SUPRACERVICAL HYSTERECTOMY WITH BILATERAL SALPINGO OOPHORECTOMY;  Surgeon: Arloa Lamar SQUIBB, MD;  Location: [facility] ORS;  Service: Gynecology;  Laterality: Bilateral;   Patient Active Problem List   Diagnosis Date Noted   OSA (obstructive sleep apnea) 04/12/2023   CPAP use counseling 04/12/2023   Varicose veins of leg with pain, right 03/27/2022   Hypothyroidism due to Hashimoto's thyroiditis 06/12/2020   IGT (impaired glucose tolerance) 06/12/2020   Irritant contact dermatitis 06/12/2020   S/P laparoscopic supracervical hysterectomy 05/27/2020   Fibroid uterus 05/14/2020   Rotator cuff tendinitis, right 11/06/2019   Traumatic complete tear of right rotator cuff 11/06/2019   COVID-19 virus infection 03/14/2019   S/P breast  biopsy, right 01/12/2019   Breast pain, right 01/12/2019   Family history of breast cancer 06/02/2018   Family history of ovarian cancer 06/02/2018   Eczema 06/10/2017   History of uterine fibroid 10/23/2016   Metabolic syndrome 10/13/2016   Medication monitoring encounter 10/13/2016   Pressure in chest 12/17/2015   Anxiety disorder 12/17/2015   Obesity 12/17/2015   Sleep apnea 08/29/2015   Snoring 08/27/2015   Vitamin D  deficiency 08/19/2015   Leg cramps 08/05/2015   IFG (impaired fasting glucose) 08/05/2015   Anal fistula    Other specified symptoms  and signs involving the digestive system and abdomen 10/31/2014   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   Bladder neoplasm of uncertain malignant potential 06/09/2013   Female genuine stress incontinence 06/05/2013   Incomplete bladder emptying 06/05/2013   Urge incontinence 06/05/2013   Dysuria 06/05/2013   Other chronic cystitis without hematuria 06/05/2013   Retention of urine 06/05/2013   RLQ abdominal pain 07/27/2012   Palpitations 09/11/2010   Hypothyroidism, adult 05/10/2009   Hyperlipidemia LDL goal <130 05/10/2009   Hypertension goal BP (blood pressure) < 140/90 05/10/2009    PCP: Clotilda VEAR Leaven, NP  REFERRING PROVIDER: same  REFERRING DIAG: I89.0  THERAPY DIAG:  Lymphedema, not elsewhere classified  Rationale for Evaluation and Treatment: Rehabilitation  ONSET DATE: 11/2021, s/p vein ablation in R leg  SUBJECTIVE:                                                                                                                                                                                           SUBJECTIVE STATEMENT: Desiree Walker is referred to Occupational Therapy by Clotilda VEAR Leaven, NP, for evaluation and treatment of RLE lymphedema. Pt is ambulatory and walks to clinic unassisted without gait abnormality. Pt reports onset of R Leg swelling and concurrent leg pain about a month after undergoing R leg venous ablation in 2023 (by report). Pt describes leg pain as only on the R side, on the soles of the fee, and in terms, and in terms typical used for Periferal, or diabetic neuropathy, including burning, sore, on fire. Pt reports she wears 15-18 mmHg ( ready made, off the shelf) , or ccl 1 (20-30 mmHg) gradient, medical grade)  compression knee highs daily. Pt endorses a positive history for chronic leg swelling; in her maternal grandmother and brother. Pt's goals for OT and this consult are , ...to be able t alleviate some of this pain. Im tired of  hurting all the time.  PERTINENT HISTORY: Hx L subtalar fusion, OSA (uses CPAP), Arthritis, HTN, Thyroid  disease,, Hx R venous ablation 2023; Hx  L CVI by report  PAIN:  Are you having pain? Yes: NPRS scale: 6/10 Pain location: R lower extremity, primarily knee down Pain description: burning, aching, hypersensitive, heaviness, fullness Aggravating factors: dependent sitting, especially in car, supine in bed at night Relieving factors: compression 20-30, gabapentin   PRECAUTIONS: Other: LYMPHEDEMA  WEIGHT BEARING RESTRICTIONS: none  FALLS:  Has patient fallen in last 6 months? 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 Im tired of hurting all the time.   OBJECTIVE: Note: Objective measures were completed at Evaluation unless otherwise noted.  COGNITION:  Overall cognitive status: Within functional limits for tasks assessed   OBSERVATIONS / OTHER ASSESSMENTS:   POSTURE: WNL  LE ROM: WFL  LE MMT: WFL  LYMPHEDEMA ASSESSMENTS:  ` SURGERY TYPE/DATE: non cancer related INFECTIONS: denies WOUND HX: denies  LYMPHEDEMA LIFE IMPACT SCALE (LLIS): N/A\  BLE COMPARATIVE LIMB VOLUMETRICS : Visual assessment w palpation  LANDMARK RIGHT    R LEG (A-D) N/A  R THIGH (E-G) ml  R FULL LIMB (A-G) ml  Limb Volume differential (LVD)  %  Volume change since initial %  Volume change overall V  (Blank rows = not tested)  LANDMARK LEFT    L LEG (A-D) N/A  L THIGH (E-G) ml  L FULL LIMB (A-G) ml  Limb Volume differential (LVD)  %  Volume change since initial %  Volume change overall %  (Blank rows = not tested)    Mild, Stage  II, Bilateral  Lower Extremity Lymphedema 2/2 CVI and Obesity  Skin  Description Hyper-Keratosis Peau d' Orange Shiny Tight Fibrotic/ Indurated Fatty Doughy Spongy/ boggy          x   Skin dry Flaky WNL Macerated     x    Color Redness Varicosities Blanching Hemosiderin Stain Mottled    x       Odor Malodorous Yeast Fungal infection  WNL      x   Temperature Warm Cool wnl     x    Pitting Edema   1+ 2+ 3+ 4+ Non-pitting            Girth Symmetrical Asymmetrical                   Distribution    Greater muscle mass on the R Below knees to ankles    Stemmer Sign Positive Negative    x   Lymphorrhea History Of:  Present Absent     x    Wounds History Of Present Absent Venous Arterial Pressure Sheer     x        Signs of Infection Redness Warmth Erythema Acute Swelling Drainage Borders                    Sensation Light Touch Deep pressure Hypersensitivity   In tact Impaired In tact Impaired Absent Impaired   x  x   x    Nails WNL   Fungus nail dystrophy   x     Hair Growth Symmetrical Asymmetrical   x    Skin Creases Base of toes  Ankles   Base of Fingers knees  Abdominal pannus Thigh Lobules  Face/neck                                                                                                                                       TREATMENT THIS DATE:  Pt edu OT evaluation and DC this date   PATIENT EDUCATION:  Education details: Eval edu Discussed differential diagnoses for various swelling disorders. Provided basic level education regarding lymphatic structure and function, etiology, onset patterns, stages of progression, and prevention to limit infection risk, worsening condition and further functional decline. Pt edu for aught interaction between blood circulatory system and lymphatic circulation.Discussed  impact of gravity and co-morbidities on lymphatic function. Outlined Complete Decongestive Therapy (CDT)  as standard of care and provided in  depth information regarding 4 primary components of Intensive and Self Management Phases, including Manual Lymph Drainage (MLD), compression wrapping and garments, skin care, and therapeutic exercise. Dempsey discussion with re need for frequent attendance and high burden of care when caregiver is needed, impact of co morbidities. We discussed  the chronic, progressive nature of lymphedema and Importance of daily, ongoing LE self-care essential for limiting progression and infection risk.  Person educated: Patient  Education method: Explanation, Demonstration, and Handouts Education comprehension: verbalized understanding, returned demonstration, verbal cues required, and needs further education  GOALS: Goals reviewed with patient? Yes   SHORT TERM GOALS: Target date: Initial eval visit only    Pt will demonstrate understanding of lymphedema precautions and prevention strategies with modified independence using a printed reference to Baseline: max a Goal status: GOAL MET  LYMPHEDEMA SELF-CARE HOME PROGRAM: BLE elevation when seated Cont w ccl 1 ( 20-30 mmHg) daytime  compression garments for venous support.  Replace q 3-6 months. 3. Daily skin care with low ph lotion matching skin ph to limit infection risk PRN     ASSESSMENT:  CLINICAL IMPRESSION: Kenisha Lynds is a 63 yo female presenting with chronic R leg pain with onset reported about 1 month after venous ablation in 2023. Bilateral legs present with varicosities, R worse than L, and R leg is slightly spongy in texture by palpation; however; signs, symptoms and skin changes associated with lymphedema, are absent. Bilateral legs do present with obviously different volumes as L Leg muscle mass is decreased s/p remote subtalar fusion. R dominance also likely contributes to obvious limb size difference.   R leg pain has worsened over time. Pt denies chronic swelling. Pt describes her leg pain as originating at the knee and extending to the  bottom of her R foot. She describes it as burning, aching and sore. Pt is hypersensitive to light touch. These sensory symptoms appear to be more in keeping with neurological issues than with lymphedema, which is typically reported as heaviness, fullness, tightness and fatigue in the limbs. .  Ms Lamison understands lymphedema etiology and progression. She agrees with my assessment that skilled Occupational Therapy for Complete  Decongestive Therapy (CDT) is not indicated at this time. I encourage her to wear comfortable, well-fitting, compression garments daily for venous support, and to elevate her legs when seated. It was my pleasure to evaluate and educate Ms Umali today. She is discharged this date with goal met.   OBJECTIVE IMPAIRMENTS:  decreased knowledge of condition, decreased knowledge of use of DME, impaired sensation,  pain.    ACTIVITY LIMITATIONS: limited standing, walking and dependent sitting tolerance, impaired sleep   PERSONAL FACTORS:  Past/current experiences, Time since onset of injury/illness/exacerbation, and 3+ co morbidities: OSA, OA, HTN, takes Gabapentin , hx venous insufficiency  are also affecting patient's functional outcome.    REHAB POTENTIAL: N/A for OT for Complete Decongestive Therapy . Lymphedema care is not indurated at this time is not indicated at this time   EVALUATION COMPLEXITY:mild   Zebedee Dec, MS, OTR/L, CLT-LANA 12/01/23 8:34 AM

## 2023-12-05 ENCOUNTER — Ambulatory Visit
Admission: EM | Admit: 2023-12-05 | Discharge: 2023-12-05 | Disposition: A | Discharge status: Home / Self Care | Attending: Emergency Medicine | Admitting: Emergency Medicine

## 2023-12-05 DIAGNOSIS — R300 Dysuria: Secondary | ICD-10-CM

## 2023-12-05 LAB — POCT URINE DIPSTICK
Bilirubin, UA: NEGATIVE
Blood, UA: NEGATIVE
Glucose, UA: NEGATIVE mg/dL
Ketones, POC UA: NEGATIVE mg/dL
Leukocytes, UA: NEGATIVE
Nitrite, UA: NEGATIVE
POC PROTEIN,UA: NEGATIVE
Spec Grav, UA: 1.02 (ref 1.010–1.025)
Urobilinogen, UA: 0.2 U/dL
pH, UA: 7 (ref 5.0–8.0)

## 2023-12-05 NOTE — Discharge Instructions (Addendum)
 Your urine is normal today.  Follow-up with your primary care provider or urologist.

## 2023-12-05 NOTE — ED Provider Notes (Signed)
 Desiree Walker    CSN: [id number] Arrival date & time: 12/05/23  0805      History   Chief Complaint Chief Complaint  Patient presents with   Dysuria    HPI Desiree Walker is a 63 y.o. female.  Patient presents with 3-day history of dysuria, urinary frequency, urinary urgency, bladder pressure.  No OTC medications taken.  No fever, chills, abdominal pain, hematuria, flank pain.  No vaginal symptoms.  Her medical history includes chronic cystitis, incontinence, retention of urine, bladder neoplasm.  Patient is followed by [facility] urology.  The history is provided by the patient and medical records.    Past Medical History:  Diagnosis Date   Anemia    H/O   Anxiety    Arthritis    BRCA negative 08/2018   MyRisk neg except NTHL1 VUS   Complication of anesthesia    Family history of breast cancer 08/2018   IBIS=8.65/riskscore=7.9%   Family history of ovarian cancer    MyRisk neg except NTHL1 VUS   GERD (gastroesophageal reflux disease)    NO MEDS   Hyperlipidemia    Hypertension    Hypothyroidism    Hashimoto's per patient   IFG (impaired fasting glucose) 08/05/2015   Metabolic syndrome 10/13/2016   Palpitations 2012   Evaluated by Timothy Gollan, MD, Holter moniter   PONV (postoperative nausea and vomiting)    Pre-diabetes    Rectal mass    Thyroid  disease    unspecified hypothyroidism    Patient Active Problem List   Diagnosis Date Noted   OSA (obstructive sleep apnea) 04/12/2023   CPAP use counseling 04/12/2023   Varicose veins of leg with pain, right 03/27/2022   Hypothyroidism due to Hashimoto's thyroiditis 06/12/2020   IGT (impaired glucose tolerance) 06/12/2020   Irritant contact dermatitis 06/12/2020   S/P laparoscopic supracervical hysterectomy 05/27/2020   Fibroid uterus 05/14/2020   Rotator cuff tendinitis, right 11/06/2019   Traumatic complete tear of right rotator cuff 11/06/2019   COVID-19 virus infection 03/14/2019   S/P breast biopsy,  right 01/12/2019   Breast pain, right 01/12/2019   Family history of breast cancer 06/02/2018   Family history of ovarian cancer 06/02/2018   Eczema 06/10/2017   History of uterine fibroid 10/23/2016   Metabolic syndrome 10/13/2016   Medication monitoring encounter 10/13/2016   Pressure in chest 12/17/2015   Anxiety disorder 12/17/2015   Obesity 12/17/2015   Sleep apnea 08/29/2015   Snoring 08/27/2015   Vitamin D  deficiency 08/19/2015   Leg cramps 08/05/2015   IFG (impaired fasting glucose) 08/05/2015   Anal fistula    Other specified symptoms and signs involving the digestive system and abdomen 10/31/2014   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   Bladder neoplasm of uncertain malignant potential 06/09/2013   Female genuine stress incontinence 06/05/2013   Incomplete bladder emptying 06/05/2013   Urge incontinence 06/05/2013   Dysuria 06/05/2013   Other chronic cystitis without hematuria 06/05/2013   Retention of urine 06/05/2013   RLQ abdominal pain 07/27/2012   Palpitations 09/11/2010   Hypothyroidism, adult 05/10/2009   Hyperlipidemia LDL goal <130 05/10/2009   Hypertension goal BP (blood pressure) < 140/90 05/10/2009    Past Surgical History:  Procedure Laterality Date   ANAL FISTULOTOMY N/A 11/30/2014   Procedure: ANAL FISTULOTOMY;  Surgeon: Elgin Laurence III, MD;  Location: [facility] ORS;  Service: General;  Laterality: N/A;   BREAST BIOPSY Right 09/08/2018   affirm bx of mass, coil clip, ORGANIZING FAT NECROSIS AND ADJACENT  CHANGES SUGGESTIVE OF RUPTURED CYST   BREAST BIOPSY Right 01/13/2021   PREDOMINANTLY BENIGN FIBROADIPOSE TISSUE SCANT FRAGMENTS OF UNREMARKABLE LYMPH NODE   CESAREAN SECTION     COLONOSCOPY  2014   COLONOSCOPY WITH PROPOFOL  N/A 08/03/2022   Procedure: COLONOSCOPY WITH PROPOFOL ;  Surgeon: Onita Elspeth Sharper, DO;  Location: [facility] ENDOSCOPY;  Service: Gastroenterology;  Laterality: N/A;   CYSTOSCOPY N/A 05/14/2020   Procedure: CYSTOSCOPY;   Surgeon: Arloa Lamar SQUIBB, MD;  Location: [facility] ORS;  Service: Gynecology;  Laterality: N/A;   DILATION AND CURETTAGE OF UTERUS     ENDOMETRIAL ABLATION W/ NOVASURE  05/29/2009   Dr Arloa   left foot surgery     RECTAL EXAM UNDER ANESTHESIA N/A 11/30/2014   Procedure: RECTAL EXAM UNDER ANESTHESIA;  Surgeon: Elgin Laurence III, MD;  Location: [facility] ORS;  Service: General;  Laterality: N/A;   TOTAL LAPAROSCOPIC HYSTERECTOMY WITH BILATERAL SALPINGO OOPHORECTOMY Bilateral 05/14/2020   LAPAROSCOPIC SUPRACERVICAL HYSTERECTOMY WITH BILATERAL SALPINGO OOPHORECTOMY;  Surgeon: Arloa Lamar SQUIBB, MD;  Location: [facility] ORS;  Service: Gynecology;  Laterality: Bilateral;    OB History     Gravida  4   Para  3   Term  3   Preterm      AB  1   Living  3      SAB  1   IAB      Ectopic      Multiple      Live Births  3        Obstetric Comments  1st Menstrual Cycle:  11 1st Pregnancy:  20          Home Medications    Prior to Admission medications   Medication Sig Start Date End Date Taking? Authorizing Provider  atorvastatin (LIPITOR) 10 MG tablet Take 10 mg by mouth daily.    [provider]  BD INTEGRA SYRINGE 25G X 1 3 ML MISC USE 1 SYRINGE MONTHLY    [provider]  cyanocobalamin (VITAMIN B12) 1000 MCG/ML injection Inject into the muscle. 06/16/22   [provider]  DULoxetine (CYMBALTA) 20 MG capsule Take 20 mg by mouth. Patient not taking: Reported on 12/05/2023 11/26/23   [provider]  DUPIXENT  300 MG/2ML prefilled syringe Inject into the skin.    [provider]  EPINEPHrine 0.3 mg/0.3 mL IJ SOAJ injection INJECT INTO THIGH ONCE IF NEEDED FOR SEVERE ALLERGIC REACTION. 08/18/23   [provider]  estradiol (ESTRACE) 0.5 MG tablet Take 0.5 mg by mouth daily. 02/21/23   [provider]  gabapentin  (NEURONTIN ) 100 MG capsule Take by mouth.    [provider]  hydrocortisone 2.5 % cream Apply topically 2  (two) times daily. 07/14/23 07/13/24  [provider]  levothyroxine  (SYNTHROID ) 112 MCG tablet TAKE ONE TABLET DAILY. EVERY SUNDAY TAKE AN EXTRA ONE-HALF TABLET. Patient taking differently: Take 112-168 mcg by mouth See admin instructions. Take 1.5 tablets (168 mcg) by mouth on Sundays & take 1 tablet (112 mcg) by mouth on all other days. 01/18/19   Lavina Damien BRAVO, FNP  losartan -hydrochlorothiazide (HYZAAR) 100-12.5 MG tablet Take 1 tablet by mouth daily. 11/03/21   [provider]  spironolactone (ALDACTONE) 25 MG tablet Take 1 tablet by mouth daily. 06/16/22   [provider]    Family History Family History  Problem Relation Age of Onset   Hypertension Mother    Heart disease Mother 54       blockages   Alzheimer's disease Mother  Diverticulitis Father    Hernia Father    Depression Father    Alzheimer's disease Father    Hypothyroidism Sister    Uterine cancer Sister 24   Hypothyroidism Sister    Ovarian cancer Sister    Hypertension Brother    Stroke Maternal Grandfather    Heart attack Maternal Grandfather    Stroke Paternal Grandmother    Hypothyroidism Daughter    Hypothyroidism Daughter    Breast cancer Maternal Aunt 39   Breast cancer Paternal Aunt 71   Ovarian cancer Paternal Aunt    Breast cancer Other 30   Pancreatic cancer Maternal Uncle    Colon cancer Maternal Uncle     Social History Social History   Tobacco Use   Smoking status: Never    Passive exposure: Never   Smokeless tobacco: Never  Vaping Use   Vaping status: Never Used  Substance Use Topics   Alcohol use: No    Alcohol/week: 0.0 standard drinks of alcohol   Drug use: No     Allergies   Latex, Amlodipine, Oxycodone , Rosuvastatin, and Scopolamine   Review of Systems Review of Systems  Constitutional:  Negative for chills and fever.  Gastrointestinal:  Negative for abdominal pain.  Genitourinary:  Positive for dysuria, frequency and urgency. Negative for  flank pain, hematuria, pelvic pain and vaginal discharge.     Physical Exam Triage Vital Signs ED Triage Vitals  Encounter Vitals Group     BP      Girls Systolic BP Percentile      Girls Diastolic BP Percentile      Boys Systolic BP Percentile      Boys Diastolic BP Percentile      Pulse      Resp      Temp      Temp src      SpO2      Weight      Height      Head Circumference      Peak Flow      Pain Score      Pain Loc      Pain Education      Exclude from Growth Chart    No data found.  Updated Vital Signs BP 131/80   Pulse 88   Temp 98.1 F (36.7 C)   Resp 16   SpO2 98%   Visual Acuity Right Eye Distance:   Left Eye Distance:   Bilateral Distance:    Right Eye Near:   Left Eye Near:    Bilateral Near:     Physical Exam Constitutional:      General: She is not in acute distress. HENT:     Mouth/Throat:     Mouth: Mucous membranes are moist.  Cardiovascular:     Rate and Rhythm: Normal rate and regular rhythm.  Pulmonary:     Effort: Pulmonary effort is normal. No respiratory distress.  Abdominal:     General: Bowel sounds are normal.     Palpations: Abdomen is soft.     Tenderness: There is no abdominal tenderness. There is no right CVA tenderness, left CVA tenderness, guarding or rebound.  Neurological:     Mental Status: She is alert.      UC Treatments / Results  Labs (all labs ordered are listed, but only abnormal results are displayed) Labs Reviewed  POCT URINE DIPSTICK    EKG   Radiology No results found.  Procedures Procedures (including critical care time)  Medications Ordered  in UC Medications - No data to display  Initial Impression / Assessment and Plan / UC Course  I have reviewed the triage vital signs and the nursing notes.  Pertinent labs & imaging results that were available during my care of the patient were reviewed by me and considered in my medical decision making (see chart for details).    Dysuria.   Urine normal today.  Afebrile and vital signs are stable.  Education provided on dysuria.  Instructed patient to follow-up with her PCP or urologist.  She agrees to plan of care.  Final Clinical Impressions(s) / UC Diagnoses   Final diagnoses:  Dysuria     Discharge Instructions      Your urine is normal today.  Follow-up with your primary care provider or urologist.     ED Prescriptions   None    PDMP not reviewed this encounter.   Corlis Burnard DEL, NP 12/05/23 [phone]

## 2023-12-05 NOTE — ED Triage Notes (Addendum)
 Patient to Urgent Care with complaints of urinary frequency/ dysuria/ pressure/ urgency.   Symptoms x3 days.   No otc meds. Pushing fluids.

## 2023-12-08 ENCOUNTER — Ambulatory Visit

## 2023-12-08 DIAGNOSIS — L814 Other melanin hyperpigmentation: Secondary | ICD-10-CM

## 2023-12-08 DIAGNOSIS — L821 Other seborrheic keratosis: Secondary | ICD-10-CM

## 2023-12-08 DIAGNOSIS — I872 Venous insufficiency (chronic) (peripheral): Secondary | ICD-10-CM

## 2023-12-08 DIAGNOSIS — W908XXA Exposure to other nonionizing radiation, initial encounter: Secondary | ICD-10-CM

## 2023-12-08 DIAGNOSIS — Z1283 Encounter for screening for malignant neoplasm of skin: Secondary | ICD-10-CM

## 2023-12-08 DIAGNOSIS — L918 Other hypertrophic disorders of the skin: Secondary | ICD-10-CM

## 2023-12-08 DIAGNOSIS — L209 Atopic dermatitis, unspecified: Secondary | ICD-10-CM

## 2023-12-08 DIAGNOSIS — D1801 Hemangioma of skin and subcutaneous tissue: Secondary | ICD-10-CM

## 2023-12-08 DIAGNOSIS — D229 Melanocytic nevi, unspecified: Secondary | ICD-10-CM

## 2023-12-08 DIAGNOSIS — L578 Other skin changes due to chronic exposure to nonionizing radiation: Secondary | ICD-10-CM

## 2023-12-08 MED ORDER — DUPILUMAB 300 MG/2ML ~~LOC~~ SOSY: 300.0000 mg | PREFILLED_SYRINGE | SUBCUTANEOUS | 5 refills | Status: DC

## 2023-12-08 NOTE — Progress Notes (Signed)
 Subjective   Desiree Walker is a 63 y.o. female who presents for the following: Total body skin exam for skin cancer screening and mole check. The patient has spots, moles and lesions to be evaluated, some may be new or changing and the patient may have concern these could be cancer.. Patient is new patient  Today patient reports: Patient here for tbse denies family or personal history of skin cancer Some spots under left lower breast Hx of atopic dermatitis has been on dupixent  for 4 or more years  Hx of flares face , hands , tends to flare when exposed rubber / elastic / latex  Was on every 14 days but has been trying to space out  Has used protopic in the past as topical treatment and worked very well, reports she is not having to use anything right now Previously followed at [facility] with Dr. Jason     Review of Systems:    No other skin or systemic complaints except as noted in HPI or Assessment and Plan.  The following portions of the chart were reviewed this encounter and updated as appropriate: medications, allergies, medical history  Relevant Medical History:  Reviewed   Objective  Well appearing patient in no apparent distress; mood and affect are within normal limits. Examination was performed of the: Full Skin Examination: scalp, head, eyes, ears, nose, lips, neck, chest, axillae, abdomen, back, buttocks, bilateral upper extremities, bilateral lower extremities, hands, feet, fingers, toes, fingernails, and toenails.   Examination notable for: SKIN EXAM, Angioma(s): Scattered red vascular papule(s)  , Lentigo/lentigines: Scattered pigmented macules that are tan to brown in color and are somewhat non-uniform in shape and concentrated in the sun-exposed areas, Nevus/nevi: Scattered well-demarcated, regular, pigmented macule(s) and/or papule(s)  , Seborrheic Keratosis(es): Stuck-on appearing keratotic papule(s) on the trunk, some  irritated with redness, crusting, edema,  and/or partial avulsion, Actinic Damage/Elastosis: chronic sun damage: dyspigmentation, telangiectasia, and wrinkling, Actinic keratosis: Scaly erythematous macule(s) concentrated on sun exposed areas , Atopic dermatitis: Diffuse xerosis with erythematous plaques on the on the trunk and extremities, most prominently in the flexural areas with moderate lichenification, erythema, and pigment alteration focally  - Diffuse xerosis and scale accentuated on the extremities, particularly the lower extremities and feet - Acrochordon(s): soft, fleshy, skin-colored to tan pedunculated papules   Examination limited by: Undergarments     Assessment & Plan    SKIN CANCER SCREENING PERFORMED TODAY.  BENIGN SKIN FINDINGS  - Lentigines at arms   - Seborrheic keratoses  - Hemangiomas   - Nevus/Multiple Benign Nevi  - Skin tag  - Reassurance provided regarding the benign appearance of lesions noted on exam today; no treatment is indicated in the absence of symptoms/changes. - Reinforced importance of photoprotective strategies including liberal and frequent sunscreen use of a broad-spectrum SPF 30 or greater, use of protective clothing, and sun avoidance for prevention of cutaneous malignancy and photoaging.  Counseled patient on the importance of regular self-skin monitoring as well as routine clinical skin examinations as scheduled.   ACTINIC DAMAGE - Chronic condition, secondary to cumulative UV/sun exposure - Recommend daily broad spectrum sunscreen SPF 30+ to sun-exposed areas, reapply every 2 hours as needed.  - Staying in the shade or wearing long sleeves, sun glasses (UVA+UVB protection) and wide brim hats (4-inch brim around the entire circumference of the hat) are also recommended for sun protection.  - Call for new or changing lesions.  Chronic venous insufficiency/pain  Has been followed by  Dr. Marea by [facility] in the past  Now followed by Dr. Gretta at [facility]   - Edu: Leg elevation above the heart as often as possible. Avoid prolonged standing position, encouraged to walk frequently. - Compression stockings 20-30 mmHg (if able to tolerate 30-40 mmHg) to use daily, and to be worn first thing in the morning when legs are not swollen yet, and remove only to sleep at night. Need to change every 6 months.  Atopic dermatitis - mild  Chronic and persistent condition with duration or expected duration over one year. Condition is symptomatic and bothersome to patient with intermittent flares  - Diagnosis, treatment options, prognosis, risk/ benefit, and side effects of treatment were discussed with the patient.  - Reviewed benign but chronic nature of disease. - Discussed dry skin care at length, recommended avoidance of fragrances, short showers with luke- warm water, no scrubbing, an unscented moisturizing soap (e.g. Dove sensitive skin) limited to the groin and axillae, and frequent emollient use (Eucerin, Aquaphor, Cerave, Vanicream, Vaseline). - Doing well on dupixent  300 mg SQ syringe q2 weeks. Advised ok to try and space out as tolerated.  - Currently obtaining from [facility] specialty pharm. Will contact them to see how many refills and let us  know when she would like us  to order to [facility] pharm    Procedures, orders, diagnosis for this visit:    There are no diagnoses linked to this encounter.  Return to clinic: No follow-ups on file.  Documentation: I have reviewed the above documentation for accuracy and completeness, and I agree with the above.  I, Eleanor Blush, CMA, am acting as scribe for Lauraine JAYSON Kanaris, MD.  Lauraine JAYSON Kanaris, MD

## 2023-12-08 NOTE — Patient Instructions (Addendum)
 Check and let us  know Dupixent  refills      Melanoma ABCDEs  Melanoma is the most dangerous type of skin cancer, and is the leading cause of death from skin disease.  You are more likely to develop melanoma if you: Have light-colored skin, light-colored eyes, or red or blond hair Spend a lot of time in the sun Tan regularly, either outdoors or in a tanning bed Have had blistering sunburns, especially during childhood Have a close family member who has had a melanoma Have atypical moles or large birthmarks  Early detection of melanoma is key since treatment is typically straightforward and cure rates are extremely high if we catch it early.   The first sign of melanoma is often a change in a mole or a new dark spot.  The ABCDE system is a way of remembering the signs of melanoma.  A for asymmetry:  The two halves do not match. B for border:  The edges of the growth are irregular. C for color:  A mixture of colors are present instead of an even brown color. D for diameter:  Melanomas are usually (but not always) greater than 6mm - the size of a pencil eraser. E for evolution:  The spot keeps changing in size, shape, and color.  Please check your skin once per month between visits. You can use a small mirror in front and a large mirror behind you to keep an eye on the back side or your body.   If you see any new or changing lesions before your next follow-up, please call to schedule a visit.  Please continue daily skin protection including broad spectrum sunscreen SPF 30+ to sun-exposed areas, reapplying every 2 hours as needed when you're outdoors.   Staying in the shade or wearing long sleeves, sun glasses (UVA+UVB protection) and wide brim hats (4-inch brim around the entire circumference of the hat) are also recommended for sun protection.    Due to recent changes in healthcare laws, you may see results of your pathology and/or laboratory studies on MyChart before the doctors have  had a chance to review them. We understand that in some cases there may be results that are confusing or concerning to you. Please understand that not all results are received at the same time and often the doctors may need to interpret multiple results in order to provide you with the best plan of care or course of treatment. Therefore, we ask that you please give us  2 business days to thoroughly review all your results before contacting the office for clarification. Should we see a critical lab result, you will be contacted sooner.   If You Need Anything After Your Visit  If you have any questions or concerns for your doctor, please call our main line at [phone] and press option 4 to reach your doctor's medical assistant. If no one answers, please leave a voicemail as directed and we will return your call as soon as possible. Messages left after 4 pm will be answered the following business day.   You may also send us  a message via MyChart. We typically respond to MyChart messages within 1-2 business days.  For prescription refills, please ask your pharmacy to contact our office. Our fax number is [phone].  If you have an urgent issue when the clinic is closed that cannot wait until the next business day, you can page your doctor at the number below.    Please note that while we do our  best to be available for urgent issues outside of office hours, we are not available 24/7.   If you have an urgent issue and are unable to reach us , you may choose to seek medical care at your doctor's office, retail clinic, urgent care center, or emergency room.  If you have a medical emergency, please immediately call 911 or go to the emergency department.  Pager Numbers  - Dr. Hester: [phone]  - Dr. Jackquline: [phone]  - Dr. Claudene: [phone]   - Dr. Raymund: [phone]  In the event of inclement weather, please call our main line at [phone] for an update on the status of any  delays or closures.  Dermatology Medication Tips: Please keep the boxes that topical medications come in in order to help keep track of the instructions about where and how to use these. Pharmacies typically print the medication instructions only on the boxes and not directly on the medication tubes.   If your medication is too expensive, please contact our office at [phone] option 4 or send us  a message through MyChart.   We are unable to tell what your co-pay for medications will be in advance as this is different depending on your insurance coverage. However, we may be able to find a substitute medication at lower cost or fill out paperwork to get insurance to cover a needed medication.   If a prior authorization is required to get your medication covered by your insurance company, please allow us  1-2 business days to complete this process.  Drug prices often vary depending on where the prescription is filled and some pharmacies may offer cheaper prices.  The website www.goodrx.com contains coupons for medications through different pharmacies. The prices here do not account for what the cost may be with help from insurance (it may be cheaper with your insurance), but the website can give you the price if you did not use any insurance.  - You can print the associated coupon and take it with your prescription to the pharmacy.  - You may also stop by our office during regular business hours and pick up a GoodRx coupon card.  - If you need your prescription sent electronically to a different pharmacy, notify our office through [facility] or by phone at [phone] option 4.     Si Usted Necesita Algo Despus de Su Visita  Tambin puede enviarnos un mensaje a travs de Clinical cytogeneticist. Por lo general respondemos a los mensajes de MyChart en el transcurso de 1 a 2 das hbiles.  Para renovar recetas, por favor pida a su farmacia que se ponga en contacto con nuestra oficina. Randi lakes de fax es [place] [phone].  Si tiene un asunto urgente cuando la clnica est cerrada y que no puede esperar hasta el siguiente da hbil, puede llamar/localizar a su doctor(a) al nmero que aparece a continuacin.   Por favor, tenga en cuenta que aunque hacemos todo lo posible para estar disponibles para asuntos urgentes fuera del horario de [place], no estamos disponibles las 24 horas del da, los 7 [address]  [address] de la [place].   Si tiene un problema urgente y no puede comunicarse con nosotros, puede optar por buscar atencin mdica  en el consultorio de su doctor(a), en una clnica privada, en un centro de atencin urgente o en una sala de emergencias.  Si tiene Engineer, drilling, por favor llame inmediatamente al 911 o vaya a la sala de emergencias.  Nmeros de bper  - Dr. Hester: [phone]  -  Rance Rattler: [phone]  - Dr. Claudene: [phone]  - Dra. Kitts: [phone]  En caso de inclemencias del [place], por favor llame a nuestra lnea principal al [phone] para una actualizacin sobre el estado de cualquier retraso o cierre.  Consejos para la medicacin en dermatologa: Por favor, guarde las cajas en las que vienen los medicamentos de uso tpico para ayudarle a seguir las instrucciones sobre dnde y cmo usarlos. Las farmacias generalmente imprimen las instrucciones del medicamento slo en las cajas y no directamente en los tubos del [place].   Si su medicamento es muy caro, por favor, pngase en contacto con landry rieger llamando al [phone] y presione la opcin 4 o envenos un mensaje a travs de Clinical cytogeneticist.   No podemos decirle cul ser su copago por los medicamentos por adelantado ya que esto es diferente dependiendo de la cobertura de su seguro. Sin embargo, es posible que podamos encontrar un medicamento sustituto a Audiological scientist un formulario para que el seguro cubra el medicamento que se considera necesario.   Si se requiere una autorizacin  previa para que su compaa de seguros malta su medicamento, por favor permtanos de 1 a 2 das hbiles para completar este proceso.  Los precios de los medicamentos varan con frecuencia dependiendo del Environmental consultant de dnde se surte la receta y alguna farmacias pueden ofrecer precios ms baratos.  El sitio web www.goodrx.com tiene cupones para medicamentos de Health and safety inspector. Los precios aqu no tienen en cuenta lo que podra costar con la ayuda del seguro (puede ser ms barato con su seguro), pero el sitio web puede darle el precio si no utiliz Tourist information centre manager.  - Puede imprimir el cupn correspondiente y llevarlo con su receta a la farmacia.  - Tambin puede pasar por nuestra oficina durante el horario de atencin regular y Education officer, museum una tarjeta de cupones de GoodRx.  - Si necesita que su receta se enve electrnicamente a una farmacia diferente, informe a nuestra oficina a travs de MyChart de [place] o por telfono llamando al [phone] y presione la opcin 4.

## 2023-12-13 ENCOUNTER — Ambulatory Visit
Admission: RE | Admit: 2023-12-13 | Discharge: 2023-12-13 | Disposition: A | Source: Ambulatory Visit | Attending: Neurology | Admitting: Neurology

## 2023-12-13 DIAGNOSIS — R200 Anesthesia of skin: Secondary | ICD-10-CM

## 2023-12-13 DIAGNOSIS — R202 Paresthesia of skin: Secondary | ICD-10-CM

## 2023-12-13 DIAGNOSIS — M792 Neuralgia and neuritis, unspecified: Secondary | ICD-10-CM

## 2023-12-16 ENCOUNTER — Encounter (INDEPENDENT_AMBULATORY_CARE_PROVIDER_SITE_OTHER): Payer: Self-pay

## 2023-12-20 NOTE — Telephone Encounter (Signed)
 I don't know of any neurologist personally, but there is a neurology office in [place] that is associated with [facility] and there is also [facility] but I think it is in the [place] area

## 2023-12-20 NOTE — Telephone Encounter (Signed)
 She has sent a few messages, I think a response from you works better

## 2023-12-23 ENCOUNTER — Other Ambulatory Visit: Payer: Self-pay | Admitting: Gerontology

## 2023-12-23 DIAGNOSIS — I890 Lymphedema, not elsewhere classified: Secondary | ICD-10-CM

## 2023-12-24 ENCOUNTER — Ambulatory Visit
Admission: RE | Admit: 2023-12-24 | Discharge: 2023-12-24 | Disposition: A | Discharge status: Home / Self Care | Source: Ambulatory Visit | Attending: Gerontology | Admitting: Gerontology

## 2023-12-24 DIAGNOSIS — I890 Lymphedema, not elsewhere classified: Secondary | ICD-10-CM | POA: Insufficient documentation

## 2023-12-25 ENCOUNTER — Encounter (INDEPENDENT_AMBULATORY_CARE_PROVIDER_SITE_OTHER): Payer: Self-pay | Admitting: Vascular Surgery

## 2023-12-27 ENCOUNTER — Telehealth (INDEPENDENT_AMBULATORY_CARE_PROVIDER_SITE_OTHER): Payer: Self-pay | Admitting: Vascular Surgery

## 2023-12-27 NOTE — Telephone Encounter (Signed)
 Dr. Marea - I had a worker from [facility] reach out asking how she would transfer to them - I advised we could put in a referral to them. Could you place a referral to them? Please and thanks!

## 2023-12-27 NOTE — Telephone Encounter (Signed)
 Please see below and advise.

## 2023-12-28 ENCOUNTER — Ambulatory Visit: Admitting: Orthopedic Surgery

## 2023-12-28 ENCOUNTER — Encounter: Payer: Self-pay | Admitting: Orthopedic Surgery

## 2023-12-28 VITALS — BP 126/80 | Ht 67.0 in | Wt 209.0 lb

## 2023-12-28 DIAGNOSIS — M51362 Other intervertebral disc degeneration, lumbar region with discogenic back pain and lower extremity pain: Secondary | ICD-10-CM

## 2023-12-28 DIAGNOSIS — M47816 Spondylosis without myelopathy or radiculopathy, lumbar region: Secondary | ICD-10-CM

## 2023-12-28 DIAGNOSIS — M4726 Other spondylosis with radiculopathy, lumbar region: Secondary | ICD-10-CM | POA: Diagnosis not present

## 2023-12-28 DIAGNOSIS — M5416 Radiculopathy, lumbar region: Secondary | ICD-10-CM

## 2023-12-28 NOTE — Progress Notes (Signed)
 Referring Physician:  Steva Clotilda DEL, NP [address] [place],  [place] [address]  Primary Physician:  Desiree Clotilda DEL, NP  History of Present Illness: [name] has a history of HTN, varicose veins, sleep apnea, hypothyroidism due to Hashimoto's, hyperlipidemia.   Last seen by me on 07/22/23 and she was doing well with only intermittent numbness in right foot and occasional right leg pain.   She has known mild lumbar spondylosis with diffuse mild DDD. She has mild bilateral foraminal stenosis L3-L5. No central or foraminal stenosis seen at L5-S1, but she has disc bulge/facet hypertrophy.   She was to continue with PT at last visit. She has been seeing vascular as well.   She is here for follow up.   She has intermittent soreness in her lower back with constant right leg pain (entire leg) to her foot. She has burning in the right leg with numbness and tingling. No left leg pain. She has relief with neurontin , but she can only take at night. Pain in leg is worse with prolonged sitting.   She thinks pain has been worse since ablation of her veins in September 2024.   As above, she is on neurontin , cymbalta, and diclofenac .   She saw neurology on 12/23/23 and has EMG scheduled for 12/29/23.   Conservative measures:  Physical therapy: discharged on 01/21/23 after 5 visits as her back pain was improved- she is back in PT now at [facility] in [place]- has done 2 visits. Not in PT currently. Multimodal medical therapy including regular antiinflammatories: tylenol , motrin , neurontin  Injections: No epidural steroid injections  Past Surgery: No spine surgery  The symptoms are causing a significant impact on the patient's life.   Review of Systems:  A 10 point review of systems is negative, except for the pertinent positives and negatives detailed in the HPI.  Past Medical History: Past Medical History:  Diagnosis Date   Anemia    H/O   Anxiety    Arthritis    BRCA  negative 08/2018   MyRisk neg except NTHL1 VUS   Complication of anesthesia    Family history of breast cancer 08/2018   IBIS=8.65/riskscore=7.9%   Family history of ovarian cancer    MyRisk neg except NTHL1 VUS   GERD (gastroesophageal reflux disease)    NO MEDS   Hyperlipidemia    Hypertension    Hypothyroidism    Hashimoto's per patient   IFG (impaired fasting glucose) 08/05/2015   Metabolic syndrome 10/13/2016   Palpitations 2012   Evaluated by Desiree Gollan, MD, Holter moniter   PONV (postoperative nausea and vomiting)    Pre-diabetes    Rectal mass    Thyroid  disease    unspecified hypothyroidism    Past Surgical History: Past Surgical History:  Procedure Laterality Date   ANAL FISTULOTOMY N/A 11/30/2014   Procedure: ANAL FISTULOTOMY;  Surgeon: Desiree Laurence III, MD;  Location: [facility] ORS;  Service: General;  Laterality: N/A;   BREAST BIOPSY Right 09/08/2018   affirm bx of mass, coil clip, ORGANIZING FAT NECROSIS AND ADJACENT CHANGES SUGGESTIVE OF RUPTURED CYST   BREAST BIOPSY Right 01/13/2021   PREDOMINANTLY BENIGN FIBROADIPOSE TISSUE SCANT FRAGMENTS OF UNREMARKABLE LYMPH NODE   CESAREAN SECTION     COLONOSCOPY  2014   COLONOSCOPY WITH PROPOFOL  N/A 08/03/2022   Procedure: COLONOSCOPY WITH PROPOFOL ;  Surgeon: Desiree Elspeth Sharper, DO;  Location: [facility];  Service: Gastroenterology;  Laterality: N/A;   CYSTOSCOPY N/A 05/14/2020   Procedure: CYSTOSCOPY;  Surgeon: Desiree Lamar SQUIBB, MD;  Location: [facility] ORS;  Service: Gynecology;  Laterality: N/A;   DILATION AND CURETTAGE OF UTERUS     ENDOMETRIAL ABLATION W/ NOVASURE  05/29/2009   Dr Desiree   left foot surgery     RECTAL EXAM UNDER ANESTHESIA N/A 11/30/2014   Procedure: RECTAL EXAM UNDER ANESTHESIA;  Surgeon: Desiree Laurence III, MD;  Location: [facility] ORS;  Service: General;  Laterality: N/A;   TOTAL LAPAROSCOPIC HYSTERECTOMY WITH BILATERAL SALPINGO OOPHORECTOMY Bilateral 05/14/2020   LAPAROSCOPIC SUPRACERVICAL HYSTERECTOMY  WITH BILATERAL SALPINGO OOPHORECTOMY;  Surgeon: Desiree Lamar SQUIBB, MD;  Location: [facility] ORS;  Service: Gynecology;  Laterality: Bilateral;    Allergies: Allergies as of 12/28/2023 - Review Complete 12/28/2023  Allergen Reaction Noted   Latex Rash 06/05/2022   Amlodipine  10/06/2021   Oxycodone  Nausea And Vomiting 05/06/2021   Rosuvastatin Nausea Only 05/06/2021   Scopolamine Other (See Comments) 11/22/2014    Medications: Outpatient Encounter Medications as of 12/28/2023  Medication Sig   atorvastatin (LIPITOR) 10 MG tablet Take 10 mg by mouth daily.   BD INTEGRA SYRINGE 25G X 1 3 ML MISC USE 1 SYRINGE MONTHLY   cyanocobalamin (VITAMIN B12) 1000 MCG/ML injection Inject into the muscle.   DULoxetine (CYMBALTA) 20 MG capsule Take 20 mg by mouth.   DUPIXENT  300 MG/2ML prefilled syringe Inject into the skin.   EPINEPHrine 0.3 mg/0.3 mL IJ SOAJ injection INJECT INTO THIGH ONCE IF NEEDED FOR SEVERE ALLERGIC REACTION.   estradiol (ESTRACE) 0.5 MG tablet Take 0.5 mg by mouth daily.   gabapentin  (NEURONTIN ) 100 MG capsule Take by mouth.   hydrocortisone 2.5 % cream Apply topically 2 (two) times daily.   levothyroxine  (SYNTHROID ) 112 MCG tablet TAKE ONE TABLET DAILY. EVERY SUNDAY TAKE AN EXTRA ONE-HALF TABLET. (Patient taking differently: Take 112-168 mcg by mouth See admin instructions. Take 1.5 tablets (168 mcg) by mouth on Sundays & take 1 tablet (112 mcg) by mouth on all other days.)   losartan -hydrochlorothiazide (HYZAAR) 100-12.5 MG tablet Take 1 tablet by mouth daily.   spironolactone (ALDACTONE) 25 MG tablet Take 1 tablet by mouth daily.   Facility-Administered Encounter Medications as of 12/28/2023  Medication   betamethasone  acetate-betamethasone  sodium phosphate  (CELESTONE ) injection 3 mg    Social History: Social History   Tobacco Use   Smoking status: Never    Passive exposure: Never   Smokeless tobacco: Never  Vaping Use   Vaping status: Never Used  Substance Use Topics    Alcohol use: No    Alcohol/week: 0.0 standard drinks of alcohol   Drug use: No    Family Medical History: Family History  Problem Relation Age of Onset   Hypertension Mother    Heart disease Mother 15       blockages   Alzheimer's disease Mother    Diverticulitis Father    Hernia Father    Depression Father    Alzheimer's disease Father    Hypothyroidism Sister    Uterine cancer Sister 59   Hypothyroidism Sister    Ovarian cancer Sister    Hypertension Brother    Stroke Maternal Grandfather    Heart attack Maternal Grandfather    Stroke Paternal Grandmother    Hypothyroidism Daughter    Hypothyroidism Daughter    Breast cancer Maternal Aunt 15   Breast cancer Paternal Aunt 76   Ovarian cancer Paternal Aunt    Breast cancer Other 30   Pancreatic cancer Maternal Uncle    Colon cancer Maternal Uncle  Physical Examination: Vitals:   12/28/23 1101  BP: 126/80      Awake, alert, oriented to person, place, and time.  Speech is clear and fluent. Fund of knowledge is appropriate.   Cranial Nerves: Pupils equal round and reactive to light.  Facial tone is symmetric.    No abnormal lesions on exposed skin.   Strength: Side Iliopsoas Quads Hamstring PF DF EHL  R 5 5 5 5 5 5   L 5 5 5 5 5 5    Reflexes are 2+ and symmetric at the patella and achilles.     Clonus is not present.   Bilateral lower extremity sensation is intact to light touch.     She walks with a chronic limp since left foot surgery years ago.   Medical Decision Making  Imaging: Lumbar MRI dated 12/13/23:  FINDINGS:   BONES AND ALIGNMENT: Normal alignment. Normal vertebral body heights. Bone marrow signal is unremarkable.   SPINAL CORD: The conus medullaris terminates at T12-L1.   SOFT TISSUES: No paraspinal mass.   L1-L2: No significant disc herniation. No spinal canal stenosis or neural foraminal narrowing.   L2-L3: No significant disc herniation. No spinal canal stenosis or  neural foraminal narrowing.   L3-L4: There is mild diffuse disc bulging, with mild central spinal canal stenosis and mild bilateral lateral recess stenosis. No nerve root impingement.   L4-L5: There is mild diffuse disc bulging and bilateral facet arthrosis, with mild central spinal canal stenosis and bilateral lateral recess stenosis. No apparent nerve root impingement.   L5-S1: There is minimal disc bulging. Spinal canal and neural foramina are widely patent.   IMPRESSION: 1. Mild central spinal canal and bilateral lateral recess stenosis at L3-4 and L4-5 without nerve root impingement. 2. Minimal disc bulging at L5-S1 with widely patent canal and foramina.   Electronically signed by: Evalene Coho MD 12/13/2023 08:06 AM EDT RP Workstation: [id number]     Assessment and Plan: Ms. Stolze has intermittent soreness in her lower back with constant right leg pain (entire leg) to her foot. She has burning in the right leg with numbness and tingling. No left leg pain. Pain in leg is worse with prolonged sitting.   She has known mild lumbar spondylosis with diffuse mild DDD. She has mild bilateral foraminal stenosis L3-L4. No central or foraminal stenosis seen at L4-S1, but she has disc bulge/facet hypertrophy.   I don't see good cause of current right leg pain on her lumbar MRI.   Treatment options discussed with patient and following plan made:   - Agree with EMG of lower extremities that she has scheduled tomorrow with neurology Ardath).  - Will review EMG results once back and likely set up phone visit to review with her.   I spent a total of 20 minutes in face-to-face and non-face-to-face activities related to this patient's care today including review of outside records, review of imaging, review of symptoms, physical exam, discussion of differential diagnosis, discussion of treatment options, and documentation.   Glade Boys PA-C Dept. of Neurosurgery

## 2023-12-29 ENCOUNTER — Telehealth: Payer: Self-pay | Admitting: *Deleted

## 2023-12-29 ENCOUNTER — Other Ambulatory Visit: Payer: Self-pay | Admitting: *Deleted

## 2023-12-29 DIAGNOSIS — I83891 Varicose veins of right lower extremities with other complications: Secondary | ICD-10-CM

## 2023-12-29 NOTE — Telephone Encounter (Signed)
 Spoke with Mrs. Stitzer regarding referral from Dr. Fransisca office/ [facility].  Desiree Walker is s/p endovenous laser ablation of right greater saphenous vein on 11-20-2022 by Dr. Marea at [facility].  Dr. Sheree reviewed [name]'s medical records from [facility], [facility], and a neurosurgery consult.  Dr. Sheree states that our practice does not have any treatment options to offer her. Offered her an appointment for a third medical opinion with an MD (Dr. Sheree or Dr. Serene) and a venous reflux of the right leg. Mrs. Nephew states she would like to schedule the new patient appointment.  [facility] scheduler will reach out to Mrs. Branscom and make the appointment.

## 2024-01-24 ENCOUNTER — Other Ambulatory Visit: Payer: Self-pay | Admitting: Gerontology

## 2024-01-24 DIAGNOSIS — M79604 Pain in right leg: Secondary | ICD-10-CM

## 2024-01-24 DIAGNOSIS — I83811 Varicose veins of right lower extremities with pain: Secondary | ICD-10-CM

## 2024-01-24 DIAGNOSIS — I890 Lymphedema, not elsewhere classified: Secondary | ICD-10-CM

## 2024-01-24 DIAGNOSIS — I70219 Atherosclerosis of native arteries of extremities with intermittent claudication, unspecified extremity: Secondary | ICD-10-CM

## 2024-02-01 ENCOUNTER — Other Ambulatory Visit: Payer: Self-pay | Admitting: Gerontology

## 2024-02-01 ENCOUNTER — Ambulatory Visit
Admission: RE | Admit: 2024-02-01 | Discharge: 2024-02-01 | Disposition: A | Discharge status: Home / Self Care | Source: Ambulatory Visit | Attending: Gerontology | Admitting: Gerontology

## 2024-02-01 DIAGNOSIS — I70219 Atherosclerosis of native arteries of extremities with intermittent claudication, unspecified extremity: Secondary | ICD-10-CM | POA: Insufficient documentation

## 2024-02-01 DIAGNOSIS — M79604 Pain in right leg: Secondary | ICD-10-CM | POA: Diagnosis present

## 2024-02-01 DIAGNOSIS — I83811 Varicose veins of right lower extremities with pain: Secondary | ICD-10-CM

## 2024-02-01 DIAGNOSIS — I890 Lymphedema, not elsewhere classified: Secondary | ICD-10-CM | POA: Insufficient documentation

## 2024-02-01 MED ORDER — GADOBUTROL 1 MMOL/ML IV SOLN
9.0000 mL | Freq: Once | INTRAVENOUS | Status: AC | PRN
  Administered 2024-02-01: 10 mL via INTRAVENOUS

## 2024-02-24 ENCOUNTER — Ambulatory Visit (INDEPENDENT_AMBULATORY_CARE_PROVIDER_SITE_OTHER)

## 2024-02-24 DIAGNOSIS — Z872 Personal history of diseases of the skin and subcutaneous tissue: Secondary | ICD-10-CM | POA: Diagnosis not present

## 2024-02-24 DIAGNOSIS — Z09 Encounter for follow-up examination after completed treatment for conditions other than malignant neoplasm: Secondary | ICD-10-CM

## 2024-02-24 DIAGNOSIS — L2084 Intrinsic (allergic) eczema: Secondary | ICD-10-CM

## 2024-02-24 NOTE — Progress Notes (Signed)
°  °  Subjective   Desiree Walker is a 63 y.o. female who presents for the following: Rash. Patient is established patient   Today patient reports: Rash x 1 week, took Dupixent  Monday, itchy dry skin on the face especially the eyelids, chin, and chest. Pt tried tacrolimus on the face but it burned and stung. Pt allergic to rubber products and wore an old bra and thinks that may be what caused flare. Pt does use scented candles.  Review of Systems:    No other skin or systemic complaints except as noted in HPI or Assessment and Plan.  The following portions of the chart were reviewed this encounter and updated as appropriate: medications, allergies, medical history  Relevant Medical History:  Hx of atopic dermatitis, currently using Dupixent   Objective  (SKPE) Well appearing patient in no apparent distress; mood and affect are within normal limits. Examination was performed of the: Focused Exam of: the face, chest, and back   Examination notable for: erythematous papules and plaques on face, upper eyelids, r chest  Examination limited by: n/a     Assessment & Plan  (SKAP)    History of atopic dermatitis well controlled on dupixent  with pruritic eruption of face, chest - DDX eczema flare vs ACD/ICD vs dupixent  associated face/neck erythema  Chronic and persistent condition with duration or expected duration over one year. Condition is symptomatic and bothersome to patient with intermittent flares  - Diagnosis, treatment options, prognosis, risk/ benefit, and side effects of treatment were discussed with the patient.  - Reviewed benign but chronic nature of disease. - Discussed dry skin care at length, recommended avoidance of fragrances, short showers with luke- warm water, no scrubbing, an unscented moisturizing soap (e.g. Dove sensitive skin) limited to the groin and axillae, and frequent emollient use (Eucerin, Aquaphor, Cerave, Vanicream, Vaseline). - Continue dupixent  300 mg SQ  syringe q2 weeks.  - For mild areas: start triamcinolone  0.1% ointment twice daily on the chest, may use BID on the face for up to one week only. - For areas on face: start Eucrisa  ointment BID. - Ok to use Vaseline and CeraVe to help moisturize.  Continue Dupixent  300mg /81mL SQ Q2W. Dupilumab  (Dupixent ) is a biologic treatment given by injection for adults and children with moderate-to-severe atopic dermatitis. Goal is control of skin condition, not cure. It is given as 2 injections at the first dose followed by 1 injection every 2 weeks thereafter.  Young children are dosed monthly.  Potential side effects include allergic reaction, herpes infections, injection site reactions and conjunctivitis (inflammation of the eyes).  The use of Dupixent  requires long term medication management, including periodic office visits.  Level of service outlined above   Patient instructions (SKPI)   Procedures, orders, diagnosis for this visit:    There are no diagnoses linked to this encounter.  Return to clinic: Return for appointment as scheduled.  LILLETTE Rosina Mayans, CMA, am acting as scribe for Lauraine JAYSON Kanaris, MD .   Documentation: I have reviewed the above documentation for accuracy and completeness, and I agree with the above.  Lauraine JAYSON Kanaris, MD

## 2024-02-24 NOTE — Patient Instructions (Signed)

## 2024-03-03 ENCOUNTER — Ambulatory Visit: Payer: Self-pay

## 2024-03-03 ENCOUNTER — Encounter: Admission: RE | Disposition: A | Payer: Self-pay | Source: Home / Self Care | Attending: Gastroenterology

## 2024-03-03 ENCOUNTER — Other Ambulatory Visit: Payer: Self-pay

## 2024-03-03 ENCOUNTER — Ambulatory Visit
Admission: RE | Admit: 2024-03-03 | Discharge: 2024-03-03 | Disposition: A | Discharge status: Home / Self Care | Attending: Gastroenterology | Admitting: Gastroenterology

## 2024-03-03 DIAGNOSIS — R1310 Dysphagia, unspecified: Secondary | ICD-10-CM | POA: Diagnosis present

## 2024-03-03 DIAGNOSIS — K222 Esophageal obstruction: Secondary | ICD-10-CM | POA: Diagnosis not present

## 2024-03-03 DIAGNOSIS — G4730 Sleep apnea, unspecified: Secondary | ICD-10-CM | POA: Diagnosis not present

## 2024-03-03 DIAGNOSIS — Z79899 Other long term (current) drug therapy: Secondary | ICD-10-CM | POA: Diagnosis not present

## 2024-03-03 DIAGNOSIS — I10 Essential (primary) hypertension: Secondary | ICD-10-CM | POA: Diagnosis not present

## 2024-03-03 DIAGNOSIS — K449 Diaphragmatic hernia without obstruction or gangrene: Secondary | ICD-10-CM | POA: Insufficient documentation

## 2024-03-03 DIAGNOSIS — L209 Atopic dermatitis, unspecified: Secondary | ICD-10-CM | POA: Insufficient documentation

## 2024-03-03 HISTORY — PX: BIOPSY OF SKIN SUBCUTANEOUS TISSUE AND/OR MUCOUS MEMBRANE: SHX6741

## 2024-03-03 HISTORY — PX: ESOPHAGEAL DILATION: SHX303

## 2024-03-03 HISTORY — PX: ESOPHAGOGASTRODUODENOSCOPY: SHX5428

## 2024-03-03 SURGERY — EGD (ESOPHAGOGASTRODUODENOSCOPY)
Anesthesia: General

## 2024-03-03 MED ORDER — LIDOCAINE HCL (CARDIAC) PF 100 MG/5ML IV SOSY
PREFILLED_SYRINGE | INTRAVENOUS | Status: DC | PRN
  Administered 2024-03-03: 80 mg via INTRAVENOUS

## 2024-03-03 MED ORDER — DEXMEDETOMIDINE HCL IN NACL 80 MCG/20ML IV SOLN
INTRAVENOUS | Status: DC | PRN
  Administered 2024-03-03: 8 ug via INTRAVENOUS
  Administered 2024-03-03: 12 ug via INTRAVENOUS

## 2024-03-03 MED ORDER — GLYCOPYRROLATE 0.2 MG/ML IJ SOLN
INTRAMUSCULAR | Status: AC
  Filled 2024-03-03: qty 1

## 2024-03-03 MED ORDER — PROPOFOL 500 MG/50ML IV EMUL
INTRAVENOUS | Status: DC | PRN
  Administered 2024-03-03: 75 ug/kg/min via INTRAVENOUS

## 2024-03-03 MED ORDER — GLYCOPYRROLATE 0.2 MG/ML IJ SOLN
INTRAMUSCULAR | Status: DC | PRN
  Administered 2024-03-03: .2 mg via INTRAVENOUS

## 2024-03-03 MED ORDER — PROPOFOL 10 MG/ML IV BOLUS
INTRAVENOUS | Status: DC | PRN
  Administered 2024-03-03 (×3): 50 mg via INTRAVENOUS

## 2024-03-03 MED ORDER — SODIUM CHLORIDE 0.9 % IV SOLN: INTRAVENOUS | Status: DC

## 2024-03-03 MED ORDER — LIDOCAINE HCL (PF) 2 % IJ SOLN
INTRAMUSCULAR | Status: AC
  Filled 2024-03-03: qty 5

## 2024-03-03 NOTE — H&P (Signed)
 "  Pre-Procedure H&P   Patient ID: Desiree Walker is a 63 y.o. female.  Gastroenterology Provider: Elspeth Ozell Jungling, DO  Referring Provider: Clotilda Leaven, NP PCP: Leaven Clotilda DEL, NP  Date: 03/03/2024  HPI [name] is a 63 y.o. female who presents today for Esophagogastroduodenoscopy for Dysphagia .  Dysphagia to large pills and foods.  No regurgitation.  No liquid dysphagia or odynophagia.  Last underwent EGD in 2015  On Dupixent  for atopic dermatitis   Past Medical History:  Diagnosis Date   Anemia    H/O   Anxiety    Arthritis    BRCA negative 08/2018   MyRisk neg except NTHL1 VUS   Complication of anesthesia    Family history of breast cancer 08/2018   IBIS=8.65/riskscore=7.9%   Family history of ovarian cancer    MyRisk neg except NTHL1 VUS   GERD (gastroesophageal reflux disease)    NO MEDS   Hyperlipidemia    Hypertension    Hypothyroidism    Hashimoto's per patient   IFG (impaired fasting glucose) 08/05/2015   Metabolic syndrome 10/13/2016   Palpitations 2012   Evaluated by Timothy Gollan, MD, Holter moniter   PONV (postoperative nausea and vomiting)    Pre-diabetes    Rectal mass    Sleep apnea    Thyroid  disease    unspecified hypothyroidism    Past Surgical History:  Procedure Laterality Date   ANAL FISTULOTOMY N/A 11/30/2014   Procedure: ANAL FISTULOTOMY;  Surgeon: Elgin Laurence III, MD;  Location: [facility] ORS;  Service: General;  Laterality: N/A;   BREAST BIOPSY Right 09/08/2018   affirm bx of mass, coil clip, ORGANIZING FAT NECROSIS AND ADJACENT CHANGES SUGGESTIVE OF RUPTURED CYST   BREAST BIOPSY Right 01/13/2021   PREDOMINANTLY BENIGN FIBROADIPOSE TISSUE SCANT FRAGMENTS OF UNREMARKABLE LYMPH NODE   CESAREAN SECTION     COLONOSCOPY  2014   COLONOSCOPY WITH PROPOFOL  N/A 08/03/2022   Procedure: COLONOSCOPY WITH PROPOFOL ;  Surgeon: Jungling Elspeth Ozell, DO;  Location: [facility] ENDOSCOPY;  Service: Gastroenterology;  Laterality: N/A;    CYSTOSCOPY N/A 05/14/2020   Procedure: CYSTOSCOPY;  Surgeon: Arloa Lamar SQUIBB, MD;  Location: [facility] ORS;  Service: Gynecology;  Laterality: N/A;   DILATION AND CURETTAGE OF UTERUS     ENDOMETRIAL ABLATION W/ NOVASURE  05/29/2009   Dr Arloa   left foot surgery     RECTAL EXAM UNDER ANESTHESIA N/A 11/30/2014   Procedure: RECTAL EXAM UNDER ANESTHESIA;  Surgeon: Elgin Laurence III, MD;  Location: [facility] ORS;  Service: General;  Laterality: N/A;   TOTAL LAPAROSCOPIC HYSTERECTOMY WITH BILATERAL SALPINGO OOPHORECTOMY Bilateral 05/14/2020   LAPAROSCOPIC SUPRACERVICAL HYSTERECTOMY WITH BILATERAL SALPINGO OOPHORECTOMY;  Surgeon: Arloa Lamar SQUIBB, MD;  Location: [facility] ORS;  Service: Gynecology;  Laterality: Bilateral;    Family History Maternal uncle- crc and pancreatic ca No other h/o GI disease or malignancy  Review of Systems  Constitutional:  Negative for activity change, appetite change, chills, diaphoresis, fatigue, fever and unexpected weight change.  HENT:  Positive for trouble swallowing. Negative for voice change.   Respiratory:  Negative for shortness of breath and wheezing.   Cardiovascular:  Negative for chest pain, palpitations and leg swelling.  Gastrointestinal:  Negative for abdominal distention, abdominal pain, anal bleeding, blood in stool, constipation, diarrhea, nausea, rectal pain and vomiting.  Musculoskeletal:  Negative for arthralgias and myalgias.  Skin:  Negative for color change and pallor.  Neurological:  Negative for dizziness, syncope and weakness.  Psychiatric/Behavioral:  Negative for  confusion.   All other systems reviewed and are negative.    Medications Medications Ordered Prior to Encounter[1]  Pertinent medications related to GI and procedure were reviewed by me with the patient prior to the procedure  Current Medications[2]  sodium chloride  20 mL/hr at 03/03/24 0817       Allergies[3] Allergies were reviewed by me prior to the procedure  Objective    Body mass index is 34.3 kg/m. Vitals:   03/03/24 0815  BP: 133/81  Pulse: (!) 106  Resp: 18  Temp: (!) 96.6 F (35.9 C)  TempSrc: Temporal  SpO2: 99%  Weight: 99.3 kg  Height: 5' 7 (1.702 m)     Physical Exam Vitals and nursing note reviewed.  Constitutional:      General: She is not in acute distress.    Appearance: Normal appearance. She is not ill-appearing, toxic-appearing or diaphoretic.  HENT:     Head: Normocephalic and atraumatic.     Nose: Nose normal.     Mouth/Throat:     Mouth: Mucous membranes are moist.     Pharynx: Oropharynx is clear.  Eyes:     General: No scleral icterus.    Extraocular Movements: Extraocular movements intact.  Cardiovascular:     Rate and Rhythm: Regular rhythm. Tachycardia present.     Heart sounds: Normal heart sounds. No murmur heard.    No friction rub. No gallop.  Pulmonary:     Effort: Pulmonary effort is normal. No respiratory distress.     Breath sounds: Normal breath sounds. No wheezing, rhonchi or rales.  Abdominal:     General: Bowel sounds are normal. There is no distension.     Palpations: Abdomen is soft.     Tenderness: There is no abdominal tenderness. There is no guarding or rebound.  Musculoskeletal:     Cervical back: Neck supple.     Right lower leg: No edema.     Left lower leg: No edema.  Skin:    General: Skin is warm and dry.     Coloration: Skin is not jaundiced or pale.  Neurological:     General: No focal deficit present.     Mental Status: She is alert and oriented to person, place, and time. Mental status is at baseline.  Psychiatric:        Mood and Affect: Mood normal.        Behavior: Behavior normal.        Thought Content: Thought content normal.        Judgment: Judgment normal.      Assessment:  [name] is a 63 y.o. female  who presents today for Esophagogastroduodenoscopy for Dysphagia .  Plan:  Esophagogastroduodenoscopy with possible intervention  today  Esophagogastroduodenoscopy with possible biopsy, control of bleeding, polypectomy, and interventions as necessary has been discussed with the patient/patient representative. Informed consent was obtained from the patient/patient representative after explaining the indication, nature, and risks of the procedure including but not limited to death, bleeding, perforation, missed neoplasm/lesions, cardiorespiratory compromise, and reaction to medications. Opportunity for questions was given and appropriate answers were provided. Patient/patient representative has verbalized understanding is amenable to undergoing the procedure.   Elspeth Ozell Jungling, DO  [facility]  Portions of the record may have been created with voice recognition software. Occasional wrong-word or 'sound-a-like' substitutions may have occurred due to the inherent limitations of voice recognition software.  Read the chart carefully and recognize, using context, where substitutions may have occurred.     [  1]  No current facility-administered medications on file prior to encounter.   Current Outpatient Medications on File Prior to Encounter  Medication Sig Dispense Refill   atorvastatin (LIPITOR) 10 MG tablet Take 10 mg by mouth daily.     cyanocobalamin (VITAMIN B12) 1000 MCG/ML injection Inject into the muscle.     DULoxetine (CYMBALTA) 20 MG capsule Take 20 mg by mouth.     DUPIXENT  300 MG/2ML prefilled syringe Inject into the skin.     estradiol (ESTRACE) 0.5 MG tablet Take 0.5 mg by mouth daily.     levothyroxine  (SYNTHROID ) 112 MCG tablet TAKE ONE TABLET DAILY. EVERY SUNDAY TAKE AN EXTRA ONE-HALF TABLET. (Patient taking differently: Take 112-168 mcg by mouth See admin instructions. Take 1.5 tablets (168 mcg) by mouth on Sundays & take 1 tablet (112 mcg) by mouth on all other days.) 90 tablet 3   losartan -hydrochlorothiazide (HYZAAR) 100-12.5 MG tablet Take 1 tablet by mouth daily.      spironolactone (ALDACTONE) 25 MG tablet Take 1 tablet by mouth daily.     BD INTEGRA SYRINGE 25G X 1 3 ML MISC USE 1 SYRINGE MONTHLY     EPINEPHrine 0.3 mg/0.3 mL IJ SOAJ injection INJECT INTO THIGH ONCE IF NEEDED FOR SEVERE ALLERGIC REACTION.     gabapentin  (NEURONTIN ) 100 MG capsule Take by mouth. (Patient not taking: Reported on 03/03/2024)     hydrocortisone 2.5 % cream Apply topically 2 (two) times daily.    [2]  Current Facility-Administered Medications:    0.9 %  sodium chloride  infusion, , Intravenous, Continuous, Onita Elspeth Sharper, DO, Last Rate: 20 mL/hr at 03/03/24 0817, New Bag at 03/03/24 0817 [3]  Allergies Allergen Reactions   Latex Rash   Amlodipine     Other reaction(s): Other (See Comments) Ankle swelling   Oxycodone  Nausea And Vomiting    No problems with Hydrocodone    Rosuvastatin Nausea Only   Scopolamine Other (See Comments)    FELT LIKE I COULDN'T BREATHE   "

## 2024-03-03 NOTE — Anesthesia Preprocedure Evaluation (Signed)
 "                                  Anesthesia Evaluation  Patient identified by MRN, date of birth, ID band Patient awake    Reviewed: Allergy & Precautions, NPO status , Patient's Chart, lab work & pertinent test results  History of Anesthesia Complications (+) PONV and history of anesthetic complications  Airway Mallampati: III  TM Distance: <3 FB Neck ROM: full    Dental  (+) Chipped   Pulmonary sleep apnea    Pulmonary exam normal        Cardiovascular hypertension, negative cardio ROS Normal cardiovascular exam     Neuro/Psych  PSYCHIATRIC DISORDERS      negative neurological ROS     GI/Hepatic Neg liver ROS,GERD  Controlled,,  Endo/Other  Hypothyroidism    Renal/GU negative Renal ROS  negative genitourinary   Musculoskeletal   Abdominal   Peds  Hematology negative hematology ROS (+)   Anesthesia Other Findings Patient reports that they do not think that any food or pills are stuck in their throat at this time.  Past Medical History: No date: Anemia     Comment:  H/O No date: Anxiety No date: Arthritis 08/2018: BRCA negative     Comment:  MyRisk neg except NTHL1 VUS No date: Complication of anesthesia 08/2018: Family history of breast cancer     Comment:  IBIS=8.65/riskscore=7.9% No date: Family history of ovarian cancer     Comment:  MyRisk neg except NTHL1 VUS No date: GERD (gastroesophageal reflux disease)     Comment:  NO MEDS No date: Hyperlipidemia No date: Hypertension No date: Hypothyroidism     Comment:  Hashimoto's per patient 08/05/2015: IFG (impaired fasting glucose) 10/13/2016: Metabolic syndrome 2012: Palpitations     Comment:  Evaluated by Timothy Gollan, MD, Holter moniter No date: PONV (postoperative nausea and vomiting) No date: Pre-diabetes No date: Rectal mass No date: Thyroid  disease     Comment:  unspecified hypothyroidism  Past Surgical History: 11/30/2014: ANAL FISTULOTOMY; N/A     Comment:   Procedure: ANAL FISTULOTOMY;  Surgeon: Elgin Laurence III,               MD;  Location: [facility] ORS;  Service: General;  Laterality:               N/A; 09/08/2018: BREAST BIOPSY; Right     Comment:  affirm bx of mass, coil clip, ORGANIZING FAT NECROSIS               AND ADJACENT CHANGES SUGGESTIVE OF RUPTURED CYST 01/13/2021: BREAST BIOPSY; Right     Comment:  PREDOMINANTLY BENIGN FIBROADIPOSE TISSUE SCANT FRAGMENTS              OF UNREMARKABLE LYMPH NODE No date: CESAREAN SECTION 2014: COLONOSCOPY 08/03/2022: COLONOSCOPY WITH PROPOFOL ; N/A     Comment:  Procedure: COLONOSCOPY WITH PROPOFOL ;  Surgeon: Onita Elspeth Sharper, DO;  Location: [facility];  Service:               Gastroenterology;  Laterality: N/A; 05/14/2020: CYSTOSCOPY; N/A     Comment:  Procedure: CYSTOSCOPY;  Surgeon: Arloa Lamar SQUIBB, MD;                Location: [facility] ORS;  Service: Gynecology;  Laterality:  N/A; No date: DILATION AND CURETTAGE OF UTERUS 05/29/2009: ENDOMETRIAL ABLATION W/ NOVASURE     Comment:  Dr Arloa No date: left foot surgery 11/30/2014: RECTAL EXAM UNDER ANESTHESIA; N/A     Comment:  Procedure: RECTAL EXAM UNDER ANESTHESIA;  Surgeon: Elgin Laurence III, MD;  Location: [facility] ORS;  Service: General;                Laterality: N/A; 05/14/2020: TOTAL LAPAROSCOPIC HYSTERECTOMY WITH BILATERAL SALPINGO  OOPHORECTOMY; Bilateral     Comment:  LAPAROSCOPIC SUPRACERVICAL HYSTERECTOMY WITH BILATERAL               SALPINGO OOPHORECTOMY;  Surgeon: Arloa Lamar SQUIBB, MD;                Location: [facility] ORS;  Service: Gynecology;  Laterality:               Bilateral;  BMI    Body Mass Index: 34.30 kg/m      Reproductive/Obstetrics negative OB ROS                              Anesthesia Physical Anesthesia Plan  ASA: 3  Anesthesia Plan: General   Post-op Pain Management:    Induction: Intravenous  PONV Risk Score and Plan: Propofol  infusion  and TIVA  Airway Management Planned: Natural Airway and Nasal Cannula  Additional Equipment:   Intra-op Plan:   Post-operative Plan:   Informed Consent: I have reviewed the patients History and Physical, chart, labs and discussed the procedure including the risks, benefits and alternatives for the proposed anesthesia with the patient or authorized representative who has indicated his/her understanding and acceptance.     Dental Advisory Given  Plan Discussed with: Anesthesiologist, CRNA and Surgeon  Anesthesia Plan Comments: (Patient consented for risks of anesthesia including but not limited to:  - adverse reactions to medications - risk of airway placement if required - damage to eyes, teeth, lips or other oral mucosa - nerve damage due to positioning  - sore throat or hoarseness - Damage to heart, brain, nerves, lungs, other parts of body or loss of life  Patient voiced understanding and assent.)        Anesthesia Quick Evaluation  "

## 2024-03-03 NOTE — Transfer of Care (Signed)
 Immediate Anesthesia Transfer of Care Note  Patient: Desiree Walker  Procedure(s) Performed: EGD (ESOPHAGOGASTRODUODENOSCOPY) DILATION, ESOPHAGUS  Patient Location: PACU  Anesthesia Type:General  Level of Consciousness: sedated  Airway & Oxygen Therapy: Patient Spontanous Breathing  Post-op Assessment: Report given to RN and Post -op Vital signs reviewed and stable  Post vital signs: Reviewed and stable  Last Vitals:  Vitals Value Taken Time  BP 113/76 03/03/24 09:31  Temp    Pulse 85 03/03/24 09:31  Resp 16 03/03/24 09:31  SpO2 94 % 03/03/24 09:31  Vitals shown include unfiled device data.  Last Pain:  Vitals:   03/03/24 0815  TempSrc: Temporal  PainSc: 0-No pain         Complications: No notable events documented.

## 2024-03-03 NOTE — Interval H&P Note (Signed)
 History and Physical Interval Note: Preprocedure H&P from 03/03/2024  was reviewed and there was no interval change after seeing and examining the patient.  Written consent was obtained from the patient after discussion of risks, benefits, and alternatives. Patient has consented to proceed with Esophagogastroduodenoscopy with possible intervention   03/03/2024 9:04 AM  Desiree Walker  has presented today for surgery, with the diagnosis of Esophageal dysphagia (R13.19).  The various methods of treatment have been discussed with the patient and family. After consideration of risks, benefits and other options for treatment, the patient has consented to  Procedures: EGD (ESOPHAGOGASTRODUODENOSCOPY) (N/A) as a surgical intervention.  The patient's history has been reviewed, patient examined, no change in status, stable for surgery.  I have reviewed the patient's chart and labs.  Questions were answered to the patient's satisfaction.     Elspeth Ozell Jungling

## 2024-03-03 NOTE — Op Note (Signed)
 [facility] Gastroenterology Patient Name: Desiree Walker Procedure Date: 03/03/2024 9:03 AM MRN: [id number] Account #: [id number] Date of Birth: 1960-08-07 Admit Type: Outpatient Age: 63 Room: [facility] ENDO ROOM 1 Gender: Female Note Status: Finalized Instrument Name: Endoscope [id number] Procedure:             Upper GI endoscopy Indications:           Dysphagia Providers:             Elspeth Ozell Onita ROSALEA, DO Referring MD:          Clotilda Leaven, NP Medicines:             Monitored Anesthesia Care Complications:         No immediate complications. Estimated blood loss:                         Minimal. Procedure:             Pre-Anesthesia Assessment:                        - Prior to the procedure, a History and Physical was                         performed, and patient medications and allergies were                         reviewed. The patient is competent. The risks and                         benefits of the procedure and the sedation options and                         risks were discussed with the patient. All questions                         were answered and informed consent was obtained.                         Patient identification and proposed procedure were                         verified by the physician, the nurse, the anesthetist                         and the technician in the endoscopy suite. Mental                         Status Examination: alert and oriented. Airway                         Examination: normal oropharyngeal airway and neck                         mobility. Respiratory Examination: clear to                         auscultation. CV Examination: RRR, no murmurs, no S3  or S4. Prophylactic Antibiotics: The patient does not                         require prophylactic antibiotics. Prior                         Anticoagulants: The patient has taken no anticoagulant                         or antiplatelet  agents. ASA Grade Assessment: III - A                         patient with severe systemic disease. After reviewing                         the risks and benefits, the patient was deemed in                         satisfactory condition to undergo the procedure. The                         anesthesia plan was to use monitored anesthesia care                         (MAC). Immediately prior to administration of                         medications, the patient was re-assessed for adequacy                         to receive sedatives. The heart rate, respiratory                         rate, oxygen saturations, blood pressure, adequacy of                         pulmonary ventilation, and response to care were                         monitored throughout the procedure. The physical                         status of the patient was re-assessed after the                         procedure.                        After obtaining informed consent, the endoscope was                         passed under direct vision. Throughout the procedure,                         the patient's blood pressure, pulse, and oxygen                         saturations were monitored continuously. The Endoscope  was introduced through the mouth, and advanced to the                         second part of duodenum. The upper GI endoscopy was                         accomplished without difficulty. The patient tolerated                         the procedure well. Findings:      The duodenal bulb, first portion of the duodenum and second portion of       the duodenum were normal. Estimated blood loss: none.      A 2 cm hiatal hernia was present. Estimated blood loss: none.      One benign-appearing, intrinsic moderate (circumferential scarring or       stenosis; an endoscope may pass) stenosis was found 35 cm from the       incisors. This stenosis measured 1.3 cm (inner diameter) x less than one        cm (in length). The stenosis was traversed. A TTS dilator was passed       through the scope. Dilation with a 12-13.5-15 mm balloon dilator was       performed to 12 mm, 13.5 mm and 15 mm. The dilation site was examined       following endoscope reinsertion and showed mild mucosal disruption.       Small superficial disruption after 15mm tts balloon. No change with       previous 13.5 and 12 mm balloons Estimated blood loss was minimal.      Normal mucosa was found in the entire esophagus. Biopsies were obtained       from the proximal with cold forceps for histology of suspected       eosinophilic esophagitis. Estimated blood loss was minimal.      The exam of the esophagus was otherwise normal. Impression:            - Normal duodenal bulb, first portion of the duodenum                         and second portion of the duodenum.                        - 2 cm hiatal hernia.                        - Benign-appearing esophageal stenosis. Dilated.                        - Normal mucosa was found in the entire esophagus.                        - Biopsies were taken with a cold forceps for                         evaluation of eosinophilic esophagitis. Recommendation:        - Patient has a contact number available for                         emergencies. The signs and symptoms  of potential                         delayed complications were discussed with the patient.                         Return to normal activities tomorrow. Written                         discharge instructions were provided to the patient.                        - Discharge patient to home.                        - Soft diet.                        - Continue present medications.                        - No ibuprofen , naproxen , or other non-steroidal                         anti-inflammatory drugs for 5 days.                        - Await pathology results.                        - Repeat upper endoscopy PRN for  retreatment.                        - Return to GI office as previously scheduled.                        - Use Protonix (pantoprazole) 40 mg PO daily if not                         already started.                        - The findings and recommendations were discussed with                         the patient. Procedure Code(s):     --- Professional ---                        [id number], Esophagogastroduodenoscopy, flexible,                         transoral; with transendoscopic balloon dilation of                         esophagus (less than 30 mm diameter)                        43239, 59, Esophagogastroduodenoscopy, flexible,                         transoral; with biopsy, single or multiple CPT copyright 2022 American [employer]. All rights reserved.  The codes documented in this report are preliminary and upon coder review may  be revised to meet current compliance requirements. Attending Participation:      I personally performed the entire procedure. Elspeth Jungling, DO Elspeth Ozell Jungling DO, DO 03/03/2024 9:31:32 AM This report has been signed electronically. Number of Addenda: 0 Note Initiated On: 03/03/2024 9:03 AM Estimated Blood Loss:  Estimated blood loss was minimal.      [facility]

## 2024-03-03 NOTE — Anesthesia Postprocedure Evaluation (Signed)
"   Anesthesia Post Note  Patient: Desiree Walker  Procedure(s) Performed: EGD (ESOPHAGOGASTRODUODENOSCOPY) DILATION, ESOPHAGUS  Patient location during evaluation: Endoscopy Anesthesia Type: General Level of consciousness: awake and alert Pain management: pain level controlled Vital Signs Assessment: post-procedure vital signs reviewed and stable Respiratory status: spontaneous breathing, nonlabored ventilation and respiratory function stable Cardiovascular status: blood pressure returned to baseline and stable Postop Assessment: no apparent nausea or vomiting Anesthetic complications: no   No notable events documented.   Last Vitals:  Vitals:   03/03/24 0941 03/03/24 0951  BP: 109/78 120/77  Pulse: 91 87  Resp: 17 16  Temp:    SpO2: 95% 96%    Last Pain:  Vitals:   03/03/24 0941  TempSrc:   PainSc: 0-No pain                 [name] Jobani Sabado      "

## 2024-03-06 LAB — SURGICAL PATHOLOGY

## 2024-03-12 ENCOUNTER — Telehealth: Payer: Self-pay | Admitting: Orthopedic Surgery

## 2024-03-12 DIAGNOSIS — M5416 Radiculopathy, lumbar region: Secondary | ICD-10-CM

## 2024-03-12 DIAGNOSIS — M5442 Lumbago with sciatica, left side: Secondary | ICD-10-CM

## 2024-03-13 NOTE — Telephone Encounter (Signed)
 LMOM for patient to return call.

## 2024-03-13 NOTE — Telephone Encounter (Signed)
 Patient called back and states she is not taking this medication. She does not need a refill.

## 2024-03-13 NOTE — Telephone Encounter (Signed)
 Pharmacy requested refill on diclofenac . Looks like this was stopped back in May.   Please call patient and found out if she is still taking diclofenac . Will send back denied for now.

## 2024-03-16 ENCOUNTER — Ambulatory Visit: Payer: Self-pay

## 2024-03-28 NOTE — Progress Notes (Unsigned)
 "  ANNUAL GYNECOLOGICAL EXAM  HPI  Desiree Walker is a 64 y.o.-year-old [id number] who presents for an annual gynecological exam today.  She denies pelvic pain, dyspareunia, abnormal vaginal bleeding or discharge, and UTI symptoms. She has had a supracervical hysterectomy and BSO. She has a longstanding history of breast pain and has had normal breast ultrasounds and mammograms. Would like these reordered.   Medical/Surgical History Past Medical History:  Diagnosis Date   Anemia    H/O   Anxiety    Arthritis    BRCA negative 08/2018   MyRisk neg except NTHL1 VUS   Complication of anesthesia    Family history of breast cancer 08/2018   IBIS=8.65/riskscore=7.9%   Family history of ovarian cancer    MyRisk neg except NTHL1 VUS   GERD (gastroesophageal reflux disease)    NO MEDS   Hyperlipidemia    Hypertension    Hypothyroidism    Hashimoto's per patient   IFG (impaired fasting glucose) 08/05/2015   Metabolic syndrome 10/13/2016   Palpitations 2012   Evaluated by Timothy Gollan, MD, Holter moniter   PONV (postoperative nausea and vomiting)    Pre-diabetes    Rectal mass    Sleep apnea    Thyroid  disease    unspecified hypothyroidism   Past Surgical History:  Procedure Laterality Date   ANAL FISTULOTOMY N/A 11/30/2014   Procedure: ANAL FISTULOTOMY;  Surgeon: Elgin Laurence III, MD;  Location: [facility] ORS;  Service: General;  Laterality: N/A;   BIOPSY OF SKIN SUBCUTANEOUS TISSUE AND/OR MUCOUS MEMBRANE  03/03/2024   Procedure: BIOPSY, GI;  Surgeon: Onita Elspeth Sharper, DO;  Location: [facility] ENDOSCOPY;  Service: Gastroenterology;;   BREAST BIOPSY Right 09/08/2018   affirm bx of mass, coil clip, ORGANIZING FAT NECROSIS AND ADJACENT CHANGES SUGGESTIVE OF RUPTURED CYST   BREAST BIOPSY Right 01/13/2021   PREDOMINANTLY BENIGN FIBROADIPOSE TISSUE SCANT FRAGMENTS OF UNREMARKABLE LYMPH NODE   CESAREAN SECTION     COLONOSCOPY  2014   COLONOSCOPY WITH PROPOFOL  N/A 08/03/2022   Procedure:  COLONOSCOPY WITH PROPOFOL ;  Surgeon: Onita Elspeth Sharper, DO;  Location: [facility] ENDOSCOPY;  Service: Gastroenterology;  Laterality: N/A;   CYSTOSCOPY N/A 05/14/2020   Procedure: CYSTOSCOPY;  Surgeon: Arloa Lamar SQUIBB, MD;  Location: [facility] ORS;  Service: Gynecology;  Laterality: N/A;   DILATION AND CURETTAGE OF UTERUS     ENDOMETRIAL ABLATION W/ NOVASURE  05/29/2009   Dr Arloa   ESOPHAGEAL DILATION  03/03/2024   Procedure: DILATION, ESOPHAGUS;  Surgeon: Onita Elspeth Sharper, DO;  Location: [facility] ENDOSCOPY;  Service: Gastroenterology;;   ESOPHAGOGASTRODUODENOSCOPY N/A 03/03/2024   Procedure: EGD (ESOPHAGOGASTRODUODENOSCOPY);  Surgeon: Onita Elspeth Sharper, DO;  Location: [facility] ENDOSCOPY;  Service: Gastroenterology;  Laterality: N/A;   left foot surgery     RECTAL EXAM UNDER ANESTHESIA N/A 11/30/2014   Procedure: RECTAL EXAM UNDER ANESTHESIA;  Surgeon: Elgin Laurence III, MD;  Location: [facility] ORS;  Service: General;  Laterality: N/A;   TOTAL LAPAROSCOPIC HYSTERECTOMY WITH BILATERAL SALPINGO OOPHORECTOMY Bilateral 05/14/2020   LAPAROSCOPIC SUPRACERVICAL HYSTERECTOMY WITH BILATERAL SALPINGO OOPHORECTOMY;  Surgeon: Arloa Lamar SQUIBB, MD;  Location: [facility] ORS;  Service: Gynecology;  Laterality: Bilateral;    Social History Lives with ***. ***Feels safe there Work: *** Exercise: *** Substances: ***EtOH, tobacco, vape, and recreational drugs Social Drivers of Health   Tobacco Use: Low Risk (03/29/2024)   Patient History    Smoking Tobacco Use: Never    Smokeless Tobacco Use: Never    Passive Exposure: Never  Financial Resource Strain: Low  Risk  (07/12/2023)   Received from [facility]   Overall Financial Resource Strain (CARDIA)    Difficulty of Paying Living Expenses: Not hard at all  Food Insecurity: No Food Insecurity (03/29/2024)   Epic    Worried About Running Out of Food in the Last Year: Never true    Ran Out of Food in the Last Year: Never true  Transportation Needs: No  Transportation Needs (03/29/2024)   Epic    Lack of Transportation (Medical): No    Lack of Transportation (Non-Medical): No  Physical Activity: Not on file  Stress: Not on file  Social Connections: Unknown (12/18/2022)   Received from [facility]   Social Network    Social Network: Not on file  Depression (PHQ2-9): Low Risk (03/29/2024)   Depression (PHQ2-9)    PHQ-2 Score: 0  Alcohol Screen: Low Risk (03/29/2024)   Alcohol Screen    Last Alcohol Screening Score (AUDIT): 0  Housing: Low Risk (03/29/2024)   Epic    Unable to Pay for Housing in the Last Year: No    Number of Times Moved in the Last Year: 0    Homeless in the Last Year: No  Utilities: Not At Risk (03/29/2024)   Epic    Threatened with loss of utilities: No  Health Literacy: Adequate Health Literacy (03/29/2024)   B1300 Health Literacy    Frequency of need for help with medical instructions: Never     Obstetric History OB History     Gravida  4   Para  3   Term  3   Preterm      AB  1   Living  3      SAB  1   IAB      Ectopic      Multiple      Live Births  3        Obstetric Comments  1st Menstrual Cycle:  11 1st Pregnancy:  20          GYN/Menstrual History No LMP recorded. Patient has had a hysterectomy. Last Pap: 08/2022 WNL, NO HR HPV  Prevention Mammogram and breast ultrasound ordered Colonoscopy- followed by GI Flu shot/vaccines: Recommend Tdap and Shingles Vax  Current Medications Show/hide medication list[1]   ROS Constitutional: Denied constitutional symptoms, night sweats, recent illness, fatigue, fever, insomnia and weight loss.  Eyes: Denied eye symptoms, eye pain, photophobia, vision change and visual disturbance.  Ears/Nose/Throat/Neck: Denied ear, nose, throat or neck symptoms, hearing loss, nasal discharge, sinus congestion and sore throat.  Cardiovascular: Denied cardiovascular symptoms, arrhythmia, chest pain/pressure, edema, exercise intolerance, orthopnea  and palpitations.  Respiratory: Denied pulmonary symptoms, asthma, pleuritic pain, productive sputum, cough, dyspnea and wheezing.  Gastrointestinal: Denied gastro-esophageal reflux, melena, nausea and vomiting.  Genitourinary: Denied genitourinary symptoms including symptomatic vaginal discharge, pelvic relaxation issues, and urinary complaints.  Musculoskeletal: Denied musculoskeletal symptoms, stiffness, swelling, muscle weakness and myalgia.  Dermatologic: Denied dermatology symptoms, rash and scar.  Neurologic: Denied neurology symptoms, dizziness, headache, neck pain and syncope.  Psychiatric: Denied psychiatric symptoms, anxiety and depression.  Endocrine: Denied endocrine symptoms including hot flashes and night sweats.    OBJECTIVE  BP 129/82   Pulse 98   Ht 5' 7 (1.702 m)   Wt 226 lb 3.2 oz (102.6 kg)   BMI 35.43 kg/m    Physical examination General NAD, Conversant  HEENT Atraumatic; Op clear with mmm.  Normo-cephalic. Pupils reactive. Anicteric sclerae  Thyroid /Neck Smooth without nodularity or enlargement.  Normal ROM.  Neck Supple.  Skin No rashes, lesions or ulceration. Normal palpated skin turgor. No nodularity.  Breasts: No masses or discharge.  Symmetric.  No axillary adenopathy.  Lungs: Clear to auscultation.No rales or wheezes. Normal Respiratory effort, no retractions.  Heart: NSR.  No murmurs or rubs appreciated. No peripheral edema  Abdomen: Soft.  Non-tender.  No masses.  No HSM. No hernia  Extremities: Moves all appropriately.  Normal ROM for age. No lymphadenopathy.  Neuro: Oriented to PPT.  Normal mood. Normal affect.     Pelvic: Declines    ASSESSMENT  1) Annual exam, s/p supracervical hysterectomy and BSO. 2) Postmenopausal 3) Bone density screening 4) Chronic breast pain  PLAN 1) Physical exam as noted. Discussed healthy lifestyle choices and preventive care. 2) Pap UTD 3) Screening mammo ordered. Would also like breast ultrasound for chronic  breast pain.  4) Requests bone density screening. Ordered.  4) Return in one year for annual exam or as needed for concerns.   Lauraine Lakes, CNM      [1]  Outpatient Medications Prior to Visit  Medication Sig   atorvastatin (LIPITOR) 10 MG tablet Take 10 mg by mouth daily.   BD INTEGRA SYRINGE 25G X 1 3 ML MISC USE 1 SYRINGE MONTHLY   Crisaborole  (EUCRISA ) 2 % OINT Apply to eczema BID.   DULoxetine (CYMBALTA) 20 MG capsule Take 20 mg by mouth.   DUPIXENT  300 MG/2ML prefilled syringe Inject into the skin.   EPINEPHrine 0.3 mg/0.3 mL IJ SOAJ injection INJECT INTO THIGH ONCE IF NEEDED FOR SEVERE ALLERGIC REACTION.   levothyroxine  (SYNTHROID ) 112 MCG tablet TAKE ONE TABLET DAILY. EVERY SUNDAY TAKE AN EXTRA ONE-HALF TABLET.   losartan -hydrochlorothiazide (HYZAAR) 100-12.5 MG tablet Take 1 tablet by mouth daily.   spironolactone (ALDACTONE) 25 MG tablet Take 1 tablet by mouth daily.   triamcinolone  ointment (KENALOG ) 0.1 % Apply 1 gram twice daily to affected areas of skin. Stop once resolved and restart as needed for flares. Avoid use on face, armpits, groin unless otherwise indicated.   cyanocobalamin (VITAMIN B12) 1000 MCG/ML injection Inject into the muscle.   estradiol (ESTRACE) 0.5 MG tablet Take 0.5 mg by mouth daily.   gabapentin  (NEURONTIN ) 100 MG capsule Take by mouth. (Patient not taking: Reported on 03/29/2024)   hydrocortisone 2.5 % cream Apply topically 2 (two) times daily.   Facility-Administered Medications Prior to Visit  Medication Dose Route Frequency Provider   betamethasone  acetate-betamethasone  sodium phosphate  (CELESTONE ) injection 3 mg  3 mg Intra-articular Once Evans, Brent M, DPM   "

## 2024-03-29 ENCOUNTER — Ambulatory Visit: Admitting: Registered Nurse

## 2024-03-29 ENCOUNTER — Other Ambulatory Visit: Payer: Self-pay | Admitting: Registered Nurse

## 2024-03-29 ENCOUNTER — Encounter: Payer: Self-pay | Admitting: Registered Nurse

## 2024-03-29 VITALS — BP 129/82 | HR 98 | Ht 67.0 in | Wt 226.2 lb

## 2024-03-29 DIAGNOSIS — Z1231 Encounter for screening mammogram for malignant neoplasm of breast: Secondary | ICD-10-CM

## 2024-03-29 DIAGNOSIS — G8929 Other chronic pain: Secondary | ICD-10-CM

## 2024-03-29 DIAGNOSIS — Z13820 Encounter for screening for osteoporosis: Secondary | ICD-10-CM

## 2024-03-29 DIAGNOSIS — Z124 Encounter for screening for malignant neoplasm of cervix: Secondary | ICD-10-CM

## 2024-03-29 DIAGNOSIS — N644 Mastodynia: Secondary | ICD-10-CM

## 2024-03-29 DIAGNOSIS — E6609 Other obesity due to excess calories: Secondary | ICD-10-CM

## 2024-03-29 DIAGNOSIS — N63 Unspecified lump in unspecified breast: Secondary | ICD-10-CM

## 2024-03-29 DIAGNOSIS — Z01419 Encounter for gynecological examination (general) (routine) without abnormal findings: Secondary | ICD-10-CM

## 2024-03-29 NOTE — Patient Instructions (Addendum)
 Preventive Care 58-65 Years Old, Female  Preventive care refers to lifestyle choices and visits with your health care provider that can promote health and wellness. Preventive care visits are also called wellness exams.  What can I expect for my preventive care visit?  Counseling  Your health care provider may ask you questions about your:  Medical history, including:  Past medical problems.  Family medical history.  Pregnancy history.  Current health, including:  Menstrual cycle.  Method of birth control.  Emotional well-being.  Home life and relationship well-being.  Sexual activity and sexual health.  Lifestyle, including:  Alcohol, nicotine or tobacco, and drug use.  Access to firearms.  Diet, exercise, and sleep habits.  Work and work Astronomer.  Sunscreen use.  Safety issues such as seatbelt and bike helmet use.  Physical exam  Your health care provider will check your:  Height and weight. These may be used to calculate your BMI (body mass index). BMI is a measurement that tells if you are at a healthy weight.  Waist circumference. This measures the distance around your waistline. This measurement also tells if you are at a healthy weight and may help predict your risk of certain diseases, such as type 2 diabetes and high blood pressure.  Heart rate and blood pressure.  Body temperature.  Skin for abnormal spots.  What immunizations do I need?    Vaccines are usually given at various ages, according to a schedule. Your health care provider will recommend vaccines for you based on your age, medical history, and lifestyle or other factors, such as travel or where you work.  What tests do I need?  Screening  Your health care provider may recommend screening tests for certain conditions. This may include:  Lipid and cholesterol levels.  Diabetes screening. This is done by checking your blood sugar (glucose) after you have not eaten for a while (fasting).  Pelvic exam and Pap test.  Hepatitis B test.  Hepatitis C  test.  HIV (human immunodeficiency virus) test.  STI (sexually transmitted infection) testing, if you are at risk.  Lung cancer screening.  Colorectal cancer screening.  Mammogram. Talk with your health care provider about when you should start having regular mammograms. This may depend on whether you have a family history of breast cancer.  BRCA-related cancer screening. This may be done if you have a family history of breast, ovarian, tubal, or peritoneal cancers.  Bone density scan. This is done to screen for osteoporosis.  Talk with your health care provider about your test results, treatment options, and if necessary, the need for more tests.  Follow these instructions at home:  Eating and drinking    Eat a diet that includes fresh fruits and vegetables, whole grains, lean protein, and low-fat dairy products.  Take vitamin and mineral supplements as recommended by your health care provider.  Do not drink alcohol if:  Your health care provider tells you not to drink.  You are pregnant, may be pregnant, or are planning to become pregnant.  If you drink alcohol:  Limit how much you have to 0-1 drink a day.  Know how much alcohol is in your drink. In the [place] one drink equals one 12 oz bottle of beer (355 mL), one 5 oz glass of wine (148 mL), or one 1 oz glass of hard liquor (44 mL).  Lifestyle  Brush your teeth every morning and night with fluoride toothpaste. Floss one time each day.  Exercise for at least  30 minutes 5 or more days each week.  Do not use any products that contain nicotine or tobacco. These products include cigarettes, chewing tobacco, and vaping devices, such as e-cigarettes. If you need help quitting, ask your health care provider.  Do not use drugs.  If you are sexually active, practice safe sex. Use a condom or other form of protection to prevent STIs.  If you do not wish to become pregnant, use a form of birth control. If you plan to become pregnant, see your health care provider for a  prepregnancy visit.  Take aspirin only as told by your health care provider. Make sure that you understand how much to take and what form to take. Work with your health care provider to find out whether it is safe and beneficial for you to take aspirin daily.  Find healthy ways to manage stress, such as:  Meditation, yoga, or listening to music.  Journaling.  Talking to a trusted person.  Spending time with friends and family.  Minimize exposure to UV radiation to reduce your risk of skin cancer.  Safety  Always wear your seat belt while driving or riding in a vehicle.  Do not drive:  If you have been drinking alcohol. Do not ride with someone who has been drinking.  When you are tired or distracted.  While texting.  If you have been using any mind-altering substances or drugs.  Wear a helmet and other protective equipment during sports activities.  If you have firearms in your house, make sure you follow all gun safety procedures.  Seek help if you have been physically or sexually abused.  What's next?  Visit your health care provider once a year for an annual wellness visit.  Ask your health care provider how often you should have your eyes and teeth checked.  Stay up to date on all vaccines.  This information is not intended to replace advice given to you by your health care provider. Make sure you discuss any questions you have with your health care provider.  Document Revised: 08/28/2020 Document Reviewed: 08/28/2020  Elsevier Patient Education  2024 [employer].

## 2024-03-31 ENCOUNTER — Other Ambulatory Visit: Payer: Self-pay | Admitting: *Deleted

## 2024-03-31 ENCOUNTER — Inpatient Hospital Stay
Admission: RE | Admit: 2024-03-31 | Discharge: 2024-03-31 | Disposition: A | Discharge status: Home / Self Care | Payer: Self-pay | Source: Ambulatory Visit | Attending: Gerontology | Admitting: Gerontology

## 2024-03-31 DIAGNOSIS — Z1231 Encounter for screening mammogram for malignant neoplasm of breast: Secondary | ICD-10-CM

## 2024-04-06 ENCOUNTER — Ambulatory Visit
Admission: RE | Admit: 2024-04-06 | Discharge: 2024-04-06 | Disposition: A | Discharge status: Home / Self Care | Source: Ambulatory Visit | Attending: Registered Nurse | Admitting: Registered Nurse

## 2024-04-06 DIAGNOSIS — N644 Mastodynia: Secondary | ICD-10-CM

## 2024-04-06 DIAGNOSIS — Z1231 Encounter for screening mammogram for malignant neoplasm of breast: Secondary | ICD-10-CM | POA: Insufficient documentation

## 2024-04-06 DIAGNOSIS — N63 Unspecified lump in unspecified breast: Secondary | ICD-10-CM

## 2024-04-06 DIAGNOSIS — G8929 Other chronic pain: Secondary | ICD-10-CM | POA: Insufficient documentation

## 2024-04-07 ENCOUNTER — Other Ambulatory Visit: Payer: Self-pay | Admitting: Registered Nurse

## 2024-04-07 DIAGNOSIS — R928 Other abnormal and inconclusive findings on diagnostic imaging of breast: Secondary | ICD-10-CM

## 2024-04-14 ENCOUNTER — Ambulatory Visit
Admission: RE | Admit: 2024-04-14 | Discharge: 2024-04-14 | Disposition: A | Source: Ambulatory Visit | Attending: Registered Nurse | Admitting: Registered Nurse

## 2024-04-14 DIAGNOSIS — R928 Other abnormal and inconclusive findings on diagnostic imaging of breast: Secondary | ICD-10-CM | POA: Insufficient documentation

## 2024-04-14 DIAGNOSIS — N62 Hypertrophy of breast: Secondary | ICD-10-CM | POA: Insufficient documentation

## 2024-04-14 MED ORDER — LIDOCAINE 1 % OPTIME INJ - NO CHARGE
2.0000 mL | Freq: Once | INTRAMUSCULAR | Status: AC
  Administered 2024-04-14: 2 mL via INTRADERMAL
  Filled 2024-04-14: qty 2

## 2024-04-14 MED ORDER — LIDOCAINE-EPINEPHRINE 1 %-1:100000 IJ SOLN
10.0000 mL | Freq: Once | INTRAMUSCULAR | Status: AC
  Administered 2024-04-14: 10 mL via INTRADERMAL
  Filled 2024-04-14: qty 10

## 2024-04-17 LAB — SURGICAL PATHOLOGY

## 2024-04-20 ENCOUNTER — Encounter: Payer: Self-pay | Admitting: Dietician

## 2024-04-20 ENCOUNTER — Encounter: Admitting: Dietician

## 2024-04-20 VITALS — Ht 67.0 in | Wt 223.6 lb

## 2024-04-20 DIAGNOSIS — E063 Autoimmune thyroiditis: Secondary | ICD-10-CM

## 2024-04-20 DIAGNOSIS — G4733 Obstructive sleep apnea (adult) (pediatric): Secondary | ICD-10-CM

## 2024-04-20 DIAGNOSIS — I10 Essential (primary) hypertension: Secondary | ICD-10-CM

## 2024-04-20 DIAGNOSIS — E785 Hyperlipidemia, unspecified: Secondary | ICD-10-CM

## 2024-04-20 DIAGNOSIS — R7302 Impaired glucose tolerance (oral): Secondary | ICD-10-CM

## 2024-04-20 DIAGNOSIS — E6609 Other obesity due to excess calories: Secondary | ICD-10-CM

## 2024-04-20 NOTE — Progress Notes (Signed)
 Medical Nutrition Therapy: Visit start time: 1120  end time: 1220  Assessment:   Referral Diagnosis: obesity Other medical history/ diagnoses: impaired glucose tolerance, HLD, HTN, hypothyroidism Psychosocial issues/ stress concerns: none  Medications, supplements: reconciled list in medical record   Current weight: 223.6lbs Height: 5'7 BMI: 35.02   Progress and evaluation:  Patient reports efforts to lose weight in the past, including weight watchers, with only short-term success.  Uses cpap nightly to control sleep apnea; BP, choleseterol controlled with meds  HbA1C measured at 6.4%, total cholesterol 167, HDL 58.9, LDL 77, Triglycerides 154 6/72/74 Food allergies: none known Patient seeks help with improving dietary habits to promote weight loss and reduce health risk She feels her food portions are not unhealthy, but food choices including snacks need improvement.    Dietary Intake:  Usual eating pattern includes 3 meals and 3 snacks per day. Dining out frequency: 2-3 meals per week. Who plans meals/ buys groceries? self Who prepares meals? self  Breakfast: 8:30am cereal with milk, coffee with half& half, usually does not finish coffee Snack: same as pm Lunch: sandwich with deli meat (sliced bbq chicken); leftovers ie fried chicken, occ salad Snack: cheetos/ chips; snack cake Supper: fried chicken and veg ie stewed squash, canned pinto beans with relish; burger with onion sometimes without bun instant potatoes with milk; occ burger out and fries; grill chicken/ meats in summer Snack: snack cake; occ apple with peanut butter; chips Beverages: sweet tea, occ soda if avail, coffee in am, water not often  Physical activity: high level ADLs per patient; no structured exercise   Intervention:   Nutrition Care Education:   Basic nutrition: basic food groups; appropriate nutrient balance; appropriate meal and snack schedule; general nutrition guidelines    Weight control:  identifying healthy weight; importance of low sugar and low fat choices; portion control strategies including measuring, using hand comparisons, pre-portioned food/ snack options; estimated energy needs for weight loss at 1300-1400 kcal, provided guidance for 45% CHO, 25% pro, (30%) fat; encouraged whole grain and higher fiber choices for satiety and health benefit; strategies to reduce sugar in beverages; healthy snack options Advanced nutrition:  cooking techniques; dining out  Other intervention notes: Patient voices readiness for change and understanding of topics discussed Established goals for change with direction from patient   Nutritional Diagnosis:  [place]-2.1 Inpaired nutrition utilization and [place]-2.2 Altered nutrition-related laboratory As related to HTN, hyperlipidemia, impaired glucose tolerance.  As evidenced by BP and lipids controlled with medications, elevated A1C. [place]-3.3 Overweight/obesity As related to excess calories.  As evidenced by patient with current BMI of 35.   Education Materials given:  Designer, Industrial/product with food lists, sample meal pattern Snacking handout Visit summary with goals/ instructions   Learner/ who was taught:  Patient   Level of understanding: Verbalizes/ demonstrates competency  Demonstrated degree of understanding via:   Teach back Learning barriers: None  Willingness to learn/ readiness for change: Acceptance, ready for change  Monitoring and Evaluation:  Dietary intake, exercise, and body weight      follow up: 06/08/24

## 2024-04-20 NOTE — Patient Instructions (Signed)
 Choose healthier snacks most of the time -- think lower sugar and fat, more fruits, whole grains with a small amount of protein (yogurt, low fat cheese, small portion nuts) Choose lean meats (especially beef or pork products) and go light on cheese as in small portions.  Wait at least 2-3 hours between meals and snacks.  Resume blending unsweet tea with sweet and gradually add more unsweet, to adjust to less sugar.  Begin some walking at least 2 times a week, starting with a short duration such as 10-15 minutes, and gradually increase as energy increases

## 2024-04-24 ENCOUNTER — Ambulatory Visit: Admitting: Surgery

## 2024-04-24 ENCOUNTER — Ambulatory Visit (HOSPITAL_COMMUNITY): Admission: RE | Admit: 2024-04-24 | Source: Ambulatory Visit

## 2024-05-01 ENCOUNTER — Ambulatory Visit

## 2024-05-02 ENCOUNTER — Ambulatory Visit: Admitting: Podiatry

## 2024-06-08 ENCOUNTER — Encounter: Admitting: Dietician

## 2024-12-07 ENCOUNTER — Ambulatory Visit
